# Patient Record
Sex: Female | Born: 1935
Health system: Southern US, Community
[De-identification: ages and names within clinical notes are randomized; demographics above are authoritative.]

## PROBLEM LIST (undated history)

## (undated) DIAGNOSIS — I779 Disorder of arteries and arterioles, unspecified: Secondary | ICD-10-CM

## (undated) DIAGNOSIS — I4892 Unspecified atrial flutter: Secondary | ICD-10-CM

## (undated) DIAGNOSIS — I739 Peripheral vascular disease, unspecified: Secondary | ICD-10-CM

## (undated) DIAGNOSIS — I509 Heart failure, unspecified: Secondary | ICD-10-CM

## (undated) DIAGNOSIS — M1990 Unspecified osteoarthritis, unspecified site: Secondary | ICD-10-CM

## (undated) DIAGNOSIS — I517 Cardiomegaly: Secondary | ICD-10-CM

## (undated) DIAGNOSIS — I10 Essential (primary) hypertension: Secondary | ICD-10-CM

## (undated) DIAGNOSIS — I442 Atrioventricular block, complete: Secondary | ICD-10-CM

## (undated) DIAGNOSIS — I2510 Atherosclerotic heart disease of native coronary artery without angina pectoris: Secondary | ICD-10-CM

## (undated) DIAGNOSIS — R042 Hemoptysis: Secondary | ICD-10-CM

## (undated) DIAGNOSIS — K219 Gastro-esophageal reflux disease without esophagitis: Secondary | ICD-10-CM

## (undated) DIAGNOSIS — E785 Hyperlipidemia, unspecified: Secondary | ICD-10-CM

## (undated) DIAGNOSIS — E039 Hypothyroidism, unspecified: Secondary | ICD-10-CM

## (undated) DIAGNOSIS — I4819 Other persistent atrial fibrillation: Secondary | ICD-10-CM

## (undated) DIAGNOSIS — I441 Atrioventricular block, second degree: Secondary | ICD-10-CM

## (undated) DIAGNOSIS — I071 Rheumatic tricuspid insufficiency: Secondary | ICD-10-CM

## (undated) HISTORY — DX: Rheumatic tricuspid insufficiency: I07.1

## (undated) HISTORY — DX: Other persistent atrial fibrillation: I48.19

## (undated) HISTORY — PX: COLON SURGERY: SHX602

## (undated) HISTORY — PX: TONSILLECTOMY: SUR1361

## (undated) HISTORY — DX: Atrioventricular block, complete: I44.2

## (undated) HISTORY — DX: Essential (primary) hypertension: I10

## (undated) HISTORY — PX: ABDOMINAL SURGERY: SHX537

## (undated) HISTORY — PX: HEMORROIDECTOMY: SUR656

## (undated) HISTORY — DX: Cardiomegaly: I51.7

## (undated) HISTORY — DX: Hypothyroidism, unspecified: E03.9

## (undated) HISTORY — DX: Gastro-esophageal reflux disease without esophagitis: K21.9

## (undated) HISTORY — DX: Atherosclerotic heart disease of native coronary artery without angina pectoris: I25.10

## (undated) HISTORY — DX: Hyperlipidemia, unspecified: E78.5

## (undated) HISTORY — DX: Unspecified osteoarthritis, unspecified site: M19.90

## (undated) HISTORY — PX: PACEMAKER INSERTION: SHX728

---

## 2003-09-11 ENCOUNTER — Encounter (INDEPENDENT_AMBULATORY_CARE_PROVIDER_SITE_OTHER): Payer: Self-pay | Admitting: Specialist

## 2003-09-11 ENCOUNTER — Inpatient Hospital Stay (HOSPITAL_COMMUNITY): Admission: EM | Admit: 2003-09-11 | Discharge: 2003-09-16 | Payer: Self-pay | Admitting: General Surgery

## 2003-12-18 ENCOUNTER — Ambulatory Visit (HOSPITAL_COMMUNITY): Admission: RE | Admit: 2003-12-18 | Discharge: 2003-12-19 | Payer: Self-pay | Admitting: *Deleted

## 2005-07-28 ENCOUNTER — Inpatient Hospital Stay (HOSPITAL_BASED_OUTPATIENT_CLINIC_OR_DEPARTMENT_OTHER): Admission: RE | Admit: 2005-07-28 | Discharge: 2005-07-28 | Payer: Self-pay | Admitting: *Deleted

## 2009-05-17 ENCOUNTER — Encounter: Payer: Self-pay | Admitting: Internal Medicine

## 2009-06-18 ENCOUNTER — Encounter: Payer: Self-pay | Admitting: Internal Medicine

## 2009-10-23 ENCOUNTER — Ambulatory Visit: Payer: Self-pay | Admitting: Cardiology

## 2009-12-02 ENCOUNTER — Encounter: Payer: Self-pay | Admitting: Internal Medicine

## 2009-12-11 ENCOUNTER — Encounter: Payer: Self-pay | Admitting: Internal Medicine

## 2009-12-11 ENCOUNTER — Ambulatory Visit: Payer: Self-pay | Admitting: Internal Medicine

## 2009-12-11 ENCOUNTER — Ambulatory Visit (HOSPITAL_COMMUNITY): Admission: RE | Admit: 2009-12-11 | Discharge: 2009-12-11 | Payer: Self-pay | Admitting: Internal Medicine

## 2009-12-11 DIAGNOSIS — I10 Essential (primary) hypertension: Secondary | ICD-10-CM

## 2009-12-11 DIAGNOSIS — E785 Hyperlipidemia, unspecified: Secondary | ICD-10-CM

## 2009-12-11 DIAGNOSIS — I2510 Atherosclerotic heart disease of native coronary artery without angina pectoris: Secondary | ICD-10-CM

## 2009-12-11 DIAGNOSIS — I442 Atrioventricular block, complete: Secondary | ICD-10-CM

## 2009-12-11 DIAGNOSIS — Z950 Presence of cardiac pacemaker: Secondary | ICD-10-CM

## 2009-12-16 ENCOUNTER — Telehealth: Payer: Self-pay | Admitting: Internal Medicine

## 2009-12-18 ENCOUNTER — Encounter: Payer: Self-pay | Admitting: Internal Medicine

## 2009-12-19 ENCOUNTER — Ambulatory Visit: Payer: Self-pay

## 2009-12-19 ENCOUNTER — Encounter: Payer: Self-pay | Admitting: Internal Medicine

## 2009-12-24 ENCOUNTER — Telehealth: Payer: Self-pay | Admitting: Internal Medicine

## 2010-03-05 ENCOUNTER — Ambulatory Visit: Payer: Self-pay | Admitting: Internal Medicine

## 2010-03-05 ENCOUNTER — Encounter: Payer: Self-pay | Admitting: Internal Medicine

## 2010-04-24 NOTE — Procedures (Signed)
Summary: wound check/talk to ja about coumadin   Current Medications (verified): 1)  Synthroid 100 Mcg Tabs (Levothyroxine Sodium) .... Once Daily 2)  Crestor 20 Mg Tabs (Rosuvastatin Calcium) .... Take One Tablet By Mouth Daily. 3)  Losartan Potassium 100 Mg Tabs (Losartan Potassium) .... Take 1 Tablet By Mouth Once A Day 4)  Vitamin D (Ergocalciferol) 50000 Unit Caps (Ergocalciferol) .... Bi Weekly 5)  Fish Oil   Oil (Fish Oil) .... Once Daily 6)  Garlic   Powd (Garlic) .... Once Daily 7)  Vitamin B-12 5000 Mcg Subl (Cyanocobalamin) .... Once Daily 8)  Timolol Maleate 0.5 % Solg (Timolol Maleate) .... Uad 9)  Xalatan 0.005 % Soln (Latanoprost) .... Uad 10)  Aspirin 81 Mg Tbec (Aspirin) .... Take One Tablet By Mouth Daily  Allergies (verified): No Known Drug Allergies  PPM Specifications Following MD:  [name], MD     Referring MD:  [place] PPM Vendor:  Medtronic     PPM Model Number:  [id number]     PPM Serial Number:  [id number] H PPM DOI:  12/18/2003     PPM Implanting MD:  [facility]  Lead 1    Location: RA     DOI: 12/18/2003     Model #: 0973-53     Serial #: [id number]     Status: active Lead 2    Location: RV     DOI: 12/18/2003     Model #: 4196-22     Serial #: [id number]     Status: active   Indications:  CHB   PPM Follow Up Battery Voltage:  2.79 V     Battery Est. Longevity:  13.5 yrs       PPM Device Measurements Atrium  Amplitude: 5.60 mV, Impedance: 678 ohms, Threshold: 0.50 V at 0.40 msec Right Ventricle  Amplitude: 11.20 mV, Impedance: 516 ohms, Threshold: 1.00 V at 0.40 msec  Episodes MS Episodes:  1     Percent Mode Switch:  <1%     Ventricular High Rate:  0     Atrial Pacing:  28.6%     Ventricular Pacing:  3.3%  Parameters Mode:  MVP     Lower Rate Limit:  60     Upper Rate Limit:  110 Paced AV Delay:  150     Sensed AV Delay:  120 Next Cardiology Appt Due:  02/24/2010 Tech Comments:  WOUND CHECK--STERI STRIPS REMOVED.  NO REDNESS OR SWELLING AT SITE.  1  MODE SWITCH SINCE IMPLANT LASTING LESS THAN 1 MINUTE. NORMAL DEVICE FUNCTION.  NO CHANGES MADE. ROV IN 3 MTHS W/JA. PT HAVING PROBLEMS W/BP--TO BE ADDRESSED BY DB. Vella Kohler  December 19, 2009 2:20 PM

## 2010-04-24 NOTE — Procedures (Signed)
Summary: Cardiology Device Clinic   Current Medications (verified): 1)  Synthroid 100 Mcg Tabs (Levothyroxine Sodium) .... Once Daily 2)  Crestor 20 Mg Tabs (Rosuvastatin Calcium) .... Take One Tablet By Mouth Daily. 3)  Losartan Potassium 50 Mg Tabs (Losartan Potassium) .... Once Daily 4)  Vitamin D (Ergocalciferol) 50000 Unit Caps (Ergocalciferol) .... Bi Weekly 5)  Fish Oil   Oil (Fish Oil) .... Once Daily 6)  Garlic   Powd (Garlic) .... Once Daily 7)  Vitamin B-12 5000 Mcg Subl (Cyanocobalamin) .... Once Daily 8)  Timolol Maleate 0.5 % Solg (Timolol Maleate) .... Uad 9)  Xalatan 0.005 % Soln (Latanoprost) .... Uad 10)  Aspirin 81 Mg Tbec (Aspirin) .... Take One Tablet By Mouth Daily  Allergies (verified): No Known Drug Allergies  PPM Specifications Following MD:  [name], MD     Referring MD:  [place] PPM Vendor:  Medtronic     PPM Model Number:  [id number]     PPM Serial Number:  [id number] H PPM DOI:  12/18/2003     PPM Implanting MD:  [facility]  Lead 1    Location: RA     DOI: 12/18/2003     Model #: 9147-82     Serial #: [id number]     Status: active Lead 2    Location: RV     DOI: 12/18/2003     Model #: 5784-69     Serial #: [id number]     Status: active   Indications:  CHB   PPM Follow Up Battery Voltage:  EOL V       PPM Device Measurements Atrium  Impedance: 680 ohms,  Right Ventricle  Amplitude: 3.3 mV, Impedance: 408 ohms,   Episodes MS Episodes:  0     Ventricular High Rate:  0     Ventricular Pacing:  52.3% Tech Comments:  BATTERY AT EOL--REACHED ERI 05-10-09.  LAST CHECK 05-06-09 AT [facility].  PT SCHEDULED FOR GENERATOR CHANGE ON 12-11-09 W/JA.  Vella Kohler  December 11, 2009 2:01 PM MD Comments:  Pt with EnRhythm device with known early battery depletion.  Given h/o Mobitz II AV block, it was felt most prudent to proceed with pulse geneartor replacement.  This was performed by me on 12/11/09

## 2010-04-24 NOTE — Cardiovascular Report (Signed)
Summary: Office Visit   Office Visit   Imported By: Roderic Ovens 01/02/2010 11:59:58  _____________________________________________________________________  External Attachment:    Type:   Image     Comment:   External Document

## 2010-04-24 NOTE — Miscellaneous (Signed)
Summary: Device change out  Clinical Lists Changes  Observations: Added new observation of PPM IMP MD: [name], MD (12/18/2009 16:59) Added new observation of PPM DOI: 12/11/2009 (12/18/2009 16:59) Added new observation of PPM SERL#: [id number] (12/18/2009 16:59) Added new observation of PPM MODL#: [id number] (12/18/2009 16:59) Added new observation of PPMEXPLCOMM: 12/11/09 Medtronic EnRythm [id number] H EXPLANTED (12/18/2009 16:59) Added new observation of PPM INDICATN: CHB Mobitz II (12/18/2009 16:59) Added new observation of MAGNET RTE: BOL 85 ERI 65 (12/18/2009 16:59)      PPM Specifications Following MD:  [name], MD     Referring MD:  [place] PPM Vendor:  Medtronic     PPM Model Number:  ADDRL1     PPM Serial Number:  [id number] H PPM DOI:  12/11/2009     PPM Implanting MD:  [name], MD  Lead 1    Location: RA     DOI: 12/18/2003     Model #: 7425-95     Serial #: [id number]     Status: active Lead 2    Location: RV     DOI: 12/18/2003     Model #: 2951-88     Serial #: [id number]     Status: active  Magnet Response Rate:  BOL 85 ERI 65  Indications:  CHB Mobitz II  Explantation Comments:  12/11/09 Medtronic EnRythm [id number]/[id number] H EXPLANTED

## 2010-04-24 NOTE — Assessment & Plan Note (Signed)
Summary: np6/ cad, ov, pt has Surveyor, quantity gd   Visit Type:   Follow-up Primary Provider:  Laurene Footman, MD   History of Present Illness: [name] is an 77 y/o woman with h/o HTN, HL, non-obstructive CAD, moderate cartoid stenosis, CHB s/p PPM previously followed by Dr. Reyes Ivan who is now referred by Dr. Evlyn Kanner to establish cardiology f/u.               Doing very well. Remains active. Walks slowly on her property with her Advertising account planner.  Does all housework and works in yard without CP or SOB. No dizziness. Occasional plapitations says she feels like her pacemaker might be kicking in. No orthopnea, PND or edema. No focal neurological symptoms.   Not taking ASA due to stomach upset. Takes 400 ibuprofen two times a day.   TC 151 TG 117 HDL 59 LDL 69   Current Medications (verified): 1)  Synthroid 100 Mcg Tabs (Levothyroxine Sodium) .... Once Daily 2)  Crestor 20 Mg Tabs (Rosuvastatin Calcium) .... Take One Tablet By Mouth Daily. 3)  Losartan Potassium 50 Mg Tabs (Losartan Potassium) .... Once Daily 4)  Vitamin D (Ergocalciferol) 50000 Unit Caps (Ergocalciferol) .... Bi Weekly 5)  Fish Oil   Oil (Fish Oil) .... Once Daily 6)  Garlic   Powd (Garlic) .... Once Daily 7)  Vitamin B-12 5000 Mcg Subl (Cyanocobalamin) .... Once Daily 8)  Timolol Maleate 0.5 % Solg (Timolol Maleate) .... Uad 9)  Xalatan 0.005 % Soln (Latanoprost) .... Uad 10)  Aspirin 81 Mg Tbec (Aspirin) .... Take One Tablet By Mouth Daily  Allergies (verified): No Known Drug Allergies  Past History:  Past Medical History:  1.  Sick sinus syndrome, trifascicular conduction delay status post MDT dual-      chamber permanent pacemaker placement.  2.  Osteoarthritis.  3.  Gastroesophageal reflux disease.  4. Nonobs CAD - cath 2005     -EF 55-60% EDP 9      -LM: Ok     -LAD 40-50% mid     -LCX: ok    --RCA: ok 5. Carotid stenosis 1-50% bilateral (upper end) - March 2011 6. HTN 7. HL  8.  Hypothyroidism  Family History: Non-contributory  Review of Systems       As per HPI and past medical history; otherwise all systems negative.   Vital Signs:  Patient profile:   77 year old female Height:      67 inches Weight:      174 pounds BMI:     27.35 Pulse rate:   66 / minute BP sitting:   110 / 80  (right arm)  Vitals Entered By: Laurance Flatten CMA (December 11, 2009 10:25 AM)  Physical Exam  General:  Well appearing. no resp difficulty HEENT: normal Neck: supple. no JVD. Carotids 2+ bilat; no bruits. No lymphadenopathy or thryomegaly appreciated. Cor: PMI nondisplaced. Regular rate & rhythm. No rubs, murmur. +s4 Lungs: clear Abdomen: soft, nontender, nondistended. No hepatosplenomegaly. No bruits or masses. Good bowel sounds. Extremities: no cyanosis, clubbing, rash, edema Neuro: alert & orientedx3, cranial nerves grossly intact. moves all 4 extremities w/o difficulty. affect pleasant    Impression & Recommendations:  Problem # 1:  HYPERTENSION, BENIGN (ICD-401.1) Initial BP here in clinic was normal. On my manual recheck it was 190/100! I am not sure if this is white coat HTN or poorly controlled essential HTN. We will have her get a home BP cuff and monitor BPs 1-2x per day  at home and get these numbers back to [place] and Dr. Evlyn Kanner to review. Have asked her to cut down on ibuprofen which may be contributing to HTN.   Problem # 2:  CAD, NATIVE VESSEL (ICD-414.01) Stable. No evidence of ischemia. Start ECASA 81.   Problem # 3:  AV BLOCK, COMPLETE (ICD-426.0) Pacer is at end of its battery life. Will have her see EP for generator change out ASAP.  Problem # 4:  HYPERLIPIDEMIA-MIXED (ICD-272.4) LDL at goal. Continue Crestor.   Other Orders: EKG w/ Interpretation (93000)  Patient Instructions: 1)  Your physician discussed the risks, benefits and indications for preventive aspirin therapy. It is recommended that you start (or continue) taking 81 mg of aspirin a  day. 2)  We have given you a prescription to get a home BP cuff 3)  Your physician wants you to follow-up in:  6 months.  You will receive a reminder letter in the mail two months in advance. If you don't receive a letter, please call our office to schedule the follow-up appointment.

## 2010-04-24 NOTE — Cardiovascular Report (Signed)
Summary: Office Visit   Office Visit   Imported By: Roderic Ovens 12/30/2009 11:23:47  _____________________________________________________________________  External Attachment:    Type:   Image     Comment:   External Document

## 2010-04-24 NOTE — Letter (Signed)
Summary: [facility] Office Visit Note   [facility] Office Visit Note   Imported By: Roderic Ovens 12/24/2009 12:50:10  _____________________________________________________________________  External Attachment:    Type:   Image     Comment:   External Document

## 2010-04-24 NOTE — Progress Notes (Signed)
Summary: pt request call  Phone Note Call from Patient Call back at [facility] Phone [phone]   Caller: Patient Summary of Call: Pt request call Initial call taken by: Judie Grieve,  December 24, 2009 9:04 AM  Follow-up for Phone Call        spoke w/pt she is concerned b/c her BP cont. to be elevated and losartan has been increased for a week, today was 165/87, yest was 185/101 and 189/98, she is now noticing a headache occ. and some dizziness, advised she shoud f/u w/Dr Evlyn Kanner, she is agreeable Meredith Staggers, RN  December 24, 2009 9:40 AM

## 2010-04-24 NOTE — Cardiovascular Report (Signed)
Summary: Office Visit   Office Visit   Imported By: Roderic Ovens 03/13/2010 13:58:52  _____________________________________________________________________  External Attachment:    Type:   Image     Comment:   External Document

## 2010-04-24 NOTE — Progress Notes (Signed)
Summary: bp high  Phone Note Call from Patient   Caller: Patient [phone] Reason for Call: Talk to Nurse Summary of Call: pt calling re bp reading-bp high pls advise Initial call taken by: Glynda Jaeger,  December 16, 2009 10:34 AM  Follow-up for Phone Call        spoke w/pt she got a new BP monitor and has been checking her BP, 9/22 134/104 9/23, 7am 156/91, 4:30 126/78 9/24, 12pm 157/79, 9:30pm 147/84 9/25, 9:40am 186/84, 10:20 165/86, 3:30 88/71, 10:30 167/84 9/26, 10:30 189/93, 12:30 150/98 pt states she hasn't felt bad, no dizziness or headache, she takes Losartan 50mg  every am around 9am, will send to Dr Gala Romney for reveiw and call her in am Meredith Staggers, RN  December 16, 2009 5:22 PM   Additional Follow-up for Phone Call Additional follow up Details #1::        inrease losartan to 100qd. check bmet 1 week Dolores Patty, MD, [facility]  December 16, 2009 11:51 PM  Left message to call back Meredith Staggers, RN  December 17, 2009 9:26 AM   pt aware, she will incrase med, she will cont. to monitor BP and let me know how its doing Meredith Staggers, RN  December 17, 2009 11:42 AM     New/Updated Medications: LOSARTAN POTASSIUM 100 MG TABS (LOSARTAN POTASSIUM) Take 1 tablet by mouth once a day Prescriptions: LOSARTAN POTASSIUM 100 MG TABS (LOSARTAN POTASSIUM) Take 1 tablet by mouth once a day  #30 x 6   Entered by:   Meredith Staggers, RN   Authorized by:   Dolores Patty, MD, [facility]   Signed by:   Meredith Staggers, RN on 12/17/2009   Method used:   Electronically to        [facility]* (retail)       [address] [place], [place]  [address]       Ph: [phone] or [phone]       Fax: [phone]   RxID:   [id number]

## 2010-04-24 NOTE — Assessment & Plan Note (Signed)
Summary: pc2   Visit Type:  Follow-up Primary Provider:  Laurene Footman, MD   History of Present Illness: The patient presents today for routine electrophysiology followup. She reports doing very well since having her pacemaker pulse generator replaced.  Her energy has improved. The patient denies symptoms of palpitations, chest pain, shortness of breath, orthopnea, PND, lower extremity edema, dizziness, presyncope, syncope, or neurologic sequela. The patient is tolerating medications without difficulties and is otherwise without complaint today.   Current Medications (verified): 1)  Synthroid 100 Mcg Tabs (Levothyroxine Sodium) .... Once Daily 2)  Crestor 20 Mg Tabs (Rosuvastatin Calcium) .... Take One Tablet By Mouth Daily. 3)  Vitamin D (Ergocalciferol) 50000 Unit Caps (Ergocalciferol) .... Bi Weekly 4)  Fish Oil   Oil (Fish Oil) .... Once Daily 5)  Garlic   Powd (Garlic) .... Once Daily 6)  Vitamin B-12 5000 Mcg Subl (Cyanocobalamin) .... Once Daily 7)  Timolol Maleate 0.5 % Solg (Timolol Maleate) .... Uad 8)  Xalatan 0.005 % Soln (Latanoprost) .... Uad 9)  Aspirin 81 Mg Tbec (Aspirin) .... Take One Tablet By Mouth Daily 10)  Valturna 150-160 Mg Tabs (Aliskiren-Valsartan) .... Once Daily 11)  Multivitamins   Tabs (Multiple Vitamin) .... Once Daily 12)  Msm 1000 Mg Tabs (Methylsulfonylmethane) .... Daily 13)  Vitamin B-12 5000 Mcg Subl (Cyanocobalamin) .... Daily  Allergies (verified): No Known Drug Allergies  Past History:  Past Medical History: Reviewed history from 12/11/2009 and no changes required.  1.  Sick sinus syndrome, trifascicular conduction delay status post MDT dual-      chamber permanent pacemaker placement.  2.  Osteoarthritis.  3.  Gastroesophageal reflux disease.  4. Nonobs CAD - cath 2005     -EF 55-60% EDP 9      -LM: Ok     -LAD 40-50% mid     -LCX: ok    --RCA: ok 5. Carotid stenosis 1-50% bilateral (upper end) - March 2011 6. HTN 7. HL  8.  Hypothyroidism  Past Surgical History: Intussusception small bowel surgery PPM 9/11  Social History: Reviewed history from 12/10/2009 and no changes required. Tobacco Use - No.  Alcohol Use - yes Drug Use - no  Review of Systems       All systems are reviewed and negative except as listed in the HPI.   Vital Signs:  Patient profile:   77 year old female Height:      67 inches Weight:      172 pounds BMI:     27.04 Pulse rate:   72 / minute BP sitting:   136 / 76  (left arm)  Vitals Entered By: Laurance Flatten CMA (March 05, 2010 10:28 AM)  Physical Exam  General:  Well appearing. no resp difficulty HEENT: normal Neck: supple. no JVD. Carotids 2+ bilat; no bruits. No lymphadenopathy or thryomegaly appreciated. Cor: PMI nondisplaced. Regular rate & rhythm. No rubs, murmur. +s4 Lungs: clear Abdomen: soft, nontender, nondistended. No hepatosplenomegaly. No bruits or masses. Good bowel sounds. Extremities: no cyanosis, clubbing, rash, edema Neuro: alert & orientedx3, cranial nerves grossly intact. moves all 4 extremities w/o difficulty. affect pleasant pacemaker pocket is well healed   PPM Specifications Following MD:  [name], MD     Referring MD:  [place] PPM Vendor:  Medtronic     PPM Model Number:  [id number]     PPM Serial Number:  [id number] H PPM DOI:  12/11/2009     PPM Implanting MD:  [name], MD  Lead 1  Location: RA     DOI: 12/18/2003     Model #: 1610-96     Serial #: [id number]     Status: active Lead 2    Location: RV     DOI: 12/18/2003     Model #: 9147-82     Serial #: [id number]     Status: active  Magnet Response Rate:  BOL 85 ERI 65  Indications:  Mobitz II  Explantation Comments:  12/11/09 Medtronic EnRythm [id number] H EXPLANTED  PPM Follow Up Battery Voltage:  2.79 V     Battery Est. Longevity:  12 YRS       PPM Device Measurements Atrium  Amplitude: 5.60 mV, Impedance: 666 ohms, Threshold: 0.50 V at 0.40 msec Right Ventricle   Amplitude: PACED mV, Impedance: 455 ohms, Threshold: 0.750 V at 0.40 msec  Episodes MS Episodes:  30     Percent Mode Switch:  <0.1%     Ventricular High Rate:  0     Atrial Pacing:  36.0%     Ventricular Pacing:  47.2%  Parameters Mode:  MVP     Lower Rate Limit:  60     Upper Rate Limit:  110 Paced AV Delay:  150     Sensed AV Delay:  120 Next Cardiology Appt Due:  11/24/2010 Tech Comments:  30 MODE SWITCHES--ALL LESS THAN 1 MINUTE.  NORMAL DEVICE FUNCTION.  NO CHANGES MADE.  ROV W/JA SEPT 2012. Vella Kohler  March 05, 2010 10:40 AM MD Comments:  agree mode switches are likely atrial oversensing  Impression & Recommendations:  Problem # 1:  PACEMAKER, PERMANENT (ICD-V45.01) normal pacemaker function for mobitz II AV block no changes today  Problem # 2:  HYPERTENSION, BENIGN (ICD-401.1) stable continue to follow with Dr Evlyn Kanner No changes today  Problem # 3:  HYPERLIPIDEMIA-MIXED (ICD-272.4) stable Her updated medication list for this problem includes:    Crestor 20 Mg Tabs (Rosuvastatin calcium) .Marland Kitchen... Take one tablet by mouth daily.  Patient Instructions: 1)  Your physician wants you to follow-up in: Sept 2012  You will receive a reminder letter in the mail two months in advance. If you don't receive a letter, please call our office to schedule the follow-up appointment.

## 2010-05-28 ENCOUNTER — Telehealth: Payer: Self-pay | Admitting: Internal Medicine

## 2010-05-28 ENCOUNTER — Encounter: Payer: Self-pay | Admitting: Internal Medicine

## 2010-06-03 NOTE — Letter (Signed)
Summary: [facility] Phone Note  [facility] Phone Note   Imported By: Earl Many 05/29/2010 10:11:44  _____________________________________________________________________  External Attachment:    Type:   Image     Comment:   External Document

## 2010-06-03 NOTE — Progress Notes (Signed)
Summary: pt needs surgiclal clearence  Phone Note From Other Clinic Call back at [phone]   Caller: Fulton Reek from Dr. Dimas Millin Request: Talk with Nurse, Talk with Provider Summary of Call: Surgical clearence was faxed last week for patient she is having Right upper eye lesion surgery next week and they need her cleared fax# [phone] Initial call taken by: Omer Jack,  May 28, 2010 9:16 AM  Follow-up for Phone Call        clearance done and faxed Dennis Bast, RN, BSN  May 28, 2010 10:06 AM

## 2010-06-03 NOTE — Progress Notes (Signed)
Summary: Surgical Clearance.Marland KitchenMarland KitchenMarland KitchenHand Written Note  Surgical Clearance.Marland KitchenMarland KitchenMarland KitchenHand Written Note   Imported By: Cala Bradford Mesiemore 05/28/2010 10:14:52  _____________________________________________________________________  External Attachment:    Type:   Image     Comment:   External Document

## 2010-06-05 LAB — SURGICAL PCR SCREEN: MRSA, PCR: NEGATIVE

## 2010-06-05 LAB — CBC
HCT: 38.2 % (ref 36.0–46.0)
Hemoglobin: 13.5 g/dL (ref 12.0–15.0)
MCH: 31.3 pg (ref 26.0–34.0)
MCHC: 35.3 g/dL (ref 30.0–36.0)
RBC: 4.32 MIL/uL (ref 3.87–5.11)

## 2010-06-05 LAB — BASIC METABOLIC PANEL
GFR calc Af Amer: >60 mL/min (ref >60)
GFR calc non Af Amer: >60 mL/min (ref >60)
Potassium: 5.2 mEq/L — ABNORMAL HIGH (ref 3.5–5.1)
Sodium: 132 mEq/L — ABNORMAL LOW (ref 135–145)

## 2010-06-10 NOTE — Letter (Signed)
Summary: Hand Written Note.[facility]  Hand Written Note.[facility]   Imported By: Erle Crocker 06/02/2010 10:58:49  _____________________________________________________________________  External Attachment:    Type:   Image     Comment:   External Document

## 2010-08-08 NOTE — Op Note (Signed)
NAMELAURELYN, TERRERO                         ACCOUNT NO.:  [id number]   MEDICAL RECORD NO.:  [id number]                   PATIENT TYPE:  INP   LOCATION:  0001                                 FACILITY:  [facility]   PHYSICIAN:  Anselm Pancoast. Zachery Dakins, M.D.          DATE OF BIRTH:  November 03, 1924   DATE OF PROCEDURE:  09/11/2003  DATE OF DISCHARGE:                                 OPERATIVE REPORT   PREOPERATIVE DIAGNOSES:  Intussusception small bowel.   POSTOPERATIVE DIAGNOSES:  Intussusception with benign mass mid jejunum.   OPERATION:  Exploratory laparotomy, small bowel resection with functional  side to side anastomosis.   ANESTHESIA:  General.   SURGEON:  Anselm Pancoast. Zachery Dakins, M.D.   ASSISTANT:  Nurse.   HISTORY:  Beth Santiago is a 77 year old female who was referred to me by  Dr. Waynard Edwards and Dr. Ardyth Harps for a CT findings of and intussusception that  was noted at [facility].  She has had cramping abdominal pain  over the last 3-4 weeks, more intense in the last about one week and had  been started on antibiotics the previous day thinking this was possibly  diverticulitis. The patient's CT, however, did not show diverticulitis but  did show definite lesion consistent with an intussusception in the mid small  bowel and I saw her today.  She had no blood in her stool and was not  significantly distended but I recommended we proceed on with laparotomy at  this obviously was pending obstructing.  The patient preoperatively was  given 3 g of Unasyn and she has PAS stockings.  Her hematocrit was 27, this  was the first time she has ever been known to be anemic and even though we  did not document blood in her stool on rectal exam, this is lower than  normal. She has never had a colonoscopy.   The patient was taken to the operative suite, induction of general  anesthesia, endotracheal tube, oral NG tube was placed through the nose and  then the abdomen was prepped with  Betadine surgical solution and draped in a  sterile manner. A Foley catheter had been inserted sterilely.  I made a  small incision right around the umbilicus and then carefully dissected down  through the fatty tissue. The fascia was opened, Kocher's were applied and  carefully opened into the peritoneal cavity.  The small bowel was a little  bit dilated but not tense by any means and the mass was easily identified  and brought up through the small incision. I had made the incision just big  enough that I could get my fist in and of course this was very mobile and I  could bring the area above skin level. The intussusception was first kind of  straightened out and you could then feel kind of an egg size lesion that was  obviously attached to the bowel and whether it was a little  vascular change  here like possibly a Meckel's but this is not in the location of a typical  Meckel's as it is much too proximal. I divided the mesentery between [facility]  and these were ligated with 2-0 Vicryl and then I excised the area doing a  side to side functional end to end anastomosis using a GIA and then after  opening the area making sure there was good hemostasis using a TA60.  B[facility] Santiago'  had been placed on the stapled edges and then this fired and removed from  the field.  Dr. Luisa Hart did a frozen on this and says it is predominantly a  fatty mass and whether it was kind of a cyst or exactly what he is not sure  but he it is definitely not a malignancy.  I sent it for frozen to make sure  that we were not going to need to do a more mesenteric resection if it had  been a malignancy. On the proximal small bowel, it was dilated but no other  lesions were noted. The distal small bowel was small caliber and no  essential adhesions.  On the small Deaver pulling up on the upper abdomen,  we had noted a little cyst area in the left lobe of the liver which I could  feel and confirm and the gallbladder has no  gallstones that I could palpate.  The omentum was brought down over this and I had placed a few little  stitches on the mucosal edge for hemostasis and then also had closed the  little mesenteric defect with 3-0 silk sutures. Next, the bowel is lying  comfortable, the omentum is brought over it and then the fascia is closed  with interrupted sutures of #0 Prolene, about 12 sutures all total. The  subcutaneous tissue was irrigated and aspirated and the skin closed with  staples.  I am going to keep the Foley and NG tube for the next 12-24 hours  and then hopefully can remove both in the morning and probably keep her  n.p.o. for approximately 24 hours before starting a diet.  The patient  tolerated the procedure nicely and we will check a CBC again in the morning.  I did to a type and screen but there was minimal blood loss at surgery and  she is hemodynamically stable.                                               Anselm Pancoast. Zachery Dakins, M.D.    WJW/MEDQ  D:  09/11/2003  T:  09/11/2003  Job:  [id number]   cc:   Jeannett Senior A. Evlyn Kanner, M.D.  [address]  [place]  [place] [address]  Fax: [phone]

## 2010-08-08 NOTE — Discharge Summary (Signed)
[name], [name]                         ACCOUNT NO.:  [id number]   MEDICAL RECORD NO.:  [id number]                   PATIENT TYPE:  INP   LOCATION:  0444                                 FACILITY:  [facility]   PHYSICIAN:  Anselm Pancoast. Zachery Dakins, M.D.          DATE OF BIRTH:  1924/06/27   DATE OF ADMISSION:  09/11/2003  DATE OF DISCHARGE:  09/16/2003                                 DISCHARGE SUMMARY   DISCHARGE DIAGNOSES:  1. Intussusception of small bowel secondary to basically a lipoma with     ulceration.  2. Anemia secondary to gastrointestinal bleeding from ulcer over small bowel     tumor.   OPERATION:  Laparotomy with small bowel resection.  __________ .   HISTORY:  Beth Santiago is a 77 year old female, wife of a patient who had  recently died of colon cancer whom I  saw in the office on September 15, 2003.  She was referred after Dr. Waynard Santiago ordered a CT the previous day when she had  severe cramping abdominal pain.  The CT was noticed  by __________  to show  intussusception.  She was contacted after being seen in the office, and I  had her in the office the following morning and did see the patient.  She  was obviously cramping and bloated, and not as acutely ill as you would have  thought if she had a bowel obstruction.  I sent her back over to [facility]  and had a limited CT cuts performed which showed the intussusception was  still present and then added her to the OR schedule.   Laboratory studies were performed, and her hematocrit was 27.  She was not  known to be anemic.  No blood work had been done when she was seen by Dr.  Waynard Santiago.  I discussed with her about this and she said that she really did  not need transfusions or __________ and wanted not to be transfused. We  started intravenous fluids and gave her 3 grams of __________ Beth Santiago her to  surgery and she had an intussusception and mass in the bowel about the size  of an egg.  This was removed __________  staple  anastomosis, and the tumor  was examined pathologically and thought to be benign.  Final report showed  it was a benign submucosal lipoma with some ulceration but no malignancy  noted, but I had it confirmed to be benign at the time of surgery so I did  not do a __________ and mesenteric resection as I would have had it been  obviously malignant.  Postoperatively, she did fine.  Had an NG tube for  about 36 hours.  __________ [facility] the NG tube out.  She started passing  flatus the following day and then her diet was advanced.  I was out of town  over the last few days of her hospitalization.  Dr. Jamey Santiago followed her and  advanced her diet and discharged her with instructions to see me in the  office in approximately four days to remove the skin staples.  The patient's  hematocrit dropped down to approximately 23, but we started oral iron, and  this will be followed in the office.  She was never symptomatic as far as  orthostatic changes, and the hematocrit was up to about 25-26 range when she  was discharged.  She will continue on her chronic medications, which are  Prilosec, Synthroid, and she had had a question over whether she had reflux  over the last several years, and questioned whether these symptoms would  subside and that the etiology of this partial small bowel obstruction would  resolve.  She has never wanted __________ evaluation to see whether she does  or does not have __________.                                               Anselm Pancoast. Zachery Dakins, M.D.    WJW/MEDQ  D:  09/25/2003  T:  09/25/2003  Job:  [id number]

## 2010-08-08 NOTE — Cardiovascular Report (Signed)
NAMECLEMMA, JOHNSEN               ACCOUNT NO.:  [id number]   MEDICAL RECORD NO.:  [id number]          PATIENT TYPE:  OIB   LOCATION:  1962                         FACILITY:  [facility]   PHYSICIAN:  Elmore Guise., M.D.DATE OF BIRTH:  11-06-1924   DATE OF PROCEDURE:  DATE OF DISCHARGE:                              CARDIAC CATHETERIZATION   INDICATION FOR PROCEDURE:  Abnormal ECG with new ST-T wave changes.   PROCEDURE DESCRIPTION:  The patient was brought to cardiac cath lab after  appropriate informed consent.  She was prepped and draped in a sterile  fashion.  A 4-French sheath was placed in the right femoral artery without  difficulty.  Coronary angiography, LV angiography was then performed without  complication.  Sheath was removed following the procedure.  The patient was  transported out of the cardiac cath lab in stable condition.   FINDINGS:  1.  Left Main:  Normal.  2.  LAD:  Mild proximal luminal irregularities with mid 40 to 50% stenosis      at bifurcation with first septal perforator.  Distal vessel has mild      luminal irregularity.  3.  D1:  Moderate size, high branching, with mild luminal irregularities.  4.  LCX:  Nondominant with mild luminal irregularities.  5.  RCA:  Dominant with mild luminal irregularity.  6.  PDA and PLV:  Moderate size vessels and mild luminal irregularity.  7.  LV:  EF is 55 to 60%.  No significant wall motion abnormalities.  LVEDP      was 9-mmHg.   IMPRESSION:  1.  Nonobstructive mid-left anterior descending artery stenosis of 40-50%.  2.  Left circumflex, right coronary artery, and diagonal with no significant      obstructive disease.  3.  Preserved left ventricle systolic function with an ejection fraction of      55-60%.   PLAN:  1.  At the current time, I would recommend aggressive risk factor      modification as indicated.  2.  I would increase her Crestor to 5 mg daily with goal LDL of less than      70.  3.  I would  also recommend increasing her aspirin to 81 mg once daily.  4.  I will see her and 1-2 weeks or sooner if needed.      Elmore Guise., M.D.  Electronically Signed     TWK/MEDQ  D:  07/28/2005  T:  07/28/2005  Job:  [id number]   cc:   Jeannett Senior A. Evlyn Kanner, M.D.  Fax: [phone]

## 2010-08-08 NOTE — Op Note (Signed)
[name], Beth Santiago               ACCOUNT NO.:  [id number]   MEDICAL RECORD NO.:  [id number]          PATIENT TYPE:  OIB   LOCATION:  4729                         FACILITY:  [facility]   PHYSICIAN:  Elmore Guise., M.D.DATE OF BIRTH:  [birth year]   DATE OF PROCEDURE:  12/18/2003  DATE OF DISCHARGE:                                 OPERATIVE REPORT   INDICATION:  Intermittent second-degree atrioventricular block,  trifascicular block, sick sinus syndrome.   PROCEDURE:  Dual-chamber permanent pacemaker placement with a [employer]onic  EnRhythm generator.   SURGEON:  Rosine Abe, M.D.   PROCEDURE:  The patient was brought to the cardiac cath lab after  appropriate informed consent.  She was prepped and draped in a sterile  fashion.  Venogram was done on the left arm for evaluation of the axillary  and subclavian veins.  Approximately 1-1/2 inch incision was made in the  left deltopectoral groove.  Subcutaneous pocket was made with blunt and  sharp dissection.  The access was obtained with 2 separate sticks in the  left axillary vein under fluoroscopic guidance.  A 6-French peel-away sheath  was placed and the ventricular lead model 5076, serial #[id number], 52-cm  length, was placed in the ventricle without difficulty.  R wave measured 9.1  mV with a measured impedance of 1077 ohms.  Threshold was maintained at 0.8  pulse at 0.5 msec with a current drain of 1.2 mA.  Following placement of  the ventricular lead, an atrial lead, model 5076, serial #[id number] in a 45-  cm length was placed in the atrium.  P wave measured 5.3 mV with a measured  impedance of 1013 ohms.  Threshold was maintained at 1.1 volts at 0.5 msec  with a current drain of 1.4 mA.  An EnRhythm generator was then attached to  the ventricular and atrial leads.  Pocket was irrigated copiously with  Kanamycin solution.  Generator was sewn into place.  Wound was closed with 3  separate layers with a 2-0 Vicryl followed 2-0  Vicryl and subcutaneous layer  was closed with a 4-0 Vicryl suture.  The patient tolerated the procedure  well.  She will be transported from the cardiac cath lab in stable  condition.       TWK/MEDQ  D:  12/18/2003  T:  12/18/2003  Job:  [id number]

## 2010-08-08 NOTE — H&P (Signed)
NAMEMERL, GUARDINO                         ACCOUNT NO.:  [id number]   MEDICAL RECORD NO.:  [id number]                   PATIENT TYPE:  INP   LOCATION:  0001                                 FACILITY:  [facility]   PHYSICIAN:  Anselm Pancoast. Zachery Dakins, M.D.          DATE OF BIRTH:  1924-10-18   DATE OF ADMISSION:  09/11/2003  DATE OF DISCHARGE:                                HISTORY & PHYSICAL   CHIEF COMPLAINT:  Abdominal pain, cramping, intussusception on CT.   HISTORY:  Beth Santiago is a 77 year old Caucasian female whom I first saw  as a patient this morning.  I have operated on her husband six or eight  months ago with colon cancer and she states that over the last several weeks  she has had some nausea, bloating, and cramping. She has had a past history  of esophageal reflux, but these have been more cramping in the mid abdomen.  Yesterday, she saw Dr. Waynard Edwards and __________, her regular physician, and he  did lab studies that were unremarkable, but then ordered a CT that was done  at [facility]. This showed an intussusception in the small bowel probably  kind of mid small bowel. She was contacted, but stated that she was not  having any basically acute symptoms and wanted to wait and see me in the  office today. I saw her this morning and on exam she stated she did have  diarrhea after the CT contrast yesterday, but she was not cramping or  clinically obstructed. On examination I did not find her definitely acutely  tender. She was maybe more tender to the left of midline where the area was  noted on CT, but on rectal exam her stools were minimal and hemoccult  negative. I was not sure whether the intussusception was still present or  not since she was not really that symptomatic over the last 24 hours. Of  course, she has not been eating either. I sent her over here and Dr. Nicholos Johns  did limited course on the CT that confirmed that the area was still present.  I checked a CBC and the  hematocrit was 27 and this is the first time she has  ever been noted to be anemic, even though she does not admit to having any  black tarry stools or changes that she had noted like intestinal bleeding.  I recommended that we proceed on with urgent laparotomy and it was scheduled  for today.   PAST MEDICAL HISTORY:  I think she has had an inguinal hernia repair by Dr.  Samuella Cota years ago. She has a history of thyroid replacement and denies  significant weight loss. Please refer to the review of systems filled out  the nurses, which are essentially unremarkable.   CURRENT MEDICATIONS:  She is on Prilosec one q.a.m. and p.m., Tums p.r.n.,  Welchol 65 b.i.d., Zetia 10 mg q.a.m., Synthroid 50 mg two hours, baby  aspirin, Cipro which was started I think the day before yesterday when she  saw Dr. Waynard Edwards; question whether this was diverticulitis. He was also  started on Flagyl 500 mg t.i.d. and glucosamine.   ALLERGIES:  She states she is not allergic to any medications.   PHYSICAL EXAMINATION:  VITAL SIGNS: Temperature 99.3, pulse 91, respirations  24, weight 180. She is 5 feet 7 inches. Blood pressure is 139/70.  HEENT: Appears adequately hydrated.  NECK: No cervical or supraclavicular lymphadenopathy.  LUNGS: Good breath sounds.  CARDIAC: Normal sinus rhythm.  ABDOMEN: Soft, not really high pitched bowel sounds.  RECTAL EXAM: Unremarkable.  EXTREMITIES: No pedal edema. Good peripheral pulses.  CNS: Physiologic.   ADMISSION IMPRESSION:  Intussusception mid small bowel, hopefully benign.   PLAN:  Laparotomy with resection.                                               Anselm Pancoast. Zachery Dakins, M.D.    WJW/MEDQ  D:  09/11/2003  T:  09/11/2003  Job:  [id number]

## 2010-08-08 NOTE — Discharge Summary (Signed)
Beth Santiago, HOFBAUER               ACCOUNT NO.:  [id number]   MEDICAL RECORD NO.:  [id number]          PATIENT TYPE:  OIB   LOCATION:  4729                         FACILITY:  [facility]   PHYSICIAN:  Elmore Guise., M.D.DATE OF BIRTH:  Dec 06, 1924   DATE OF ADMISSION:  12/18/2003  DATE OF DISCHARGE:  12/19/2003                                 DISCHARGE SUMMARY   DISCHARGE DIAGNOSES:  1.  Sick sinus syndrome, trifascicular conduction delay status post dual-      chamber permanent pacemaker placement.  2.  Osteoarthritis.  3.  Gastroesophageal reflux disease.   HISTORY OF PRESENT ILLNESS:  This 77 year old white female with past medical  history of GERD and osteoarthritis presented for evaluation of syncope.  Telemetry monitor shows intermittent second degree AV block with  trifascicular block.  The patient underwent routine ECG stress test with  worsening conduction delay and an AV block.  The patient with no signs of  ischemia.  The patient was admitted to the hospital on September 27 for  elective pacemaker placement.   HOSPITAL COURSE:  The patient underwent dual-chamber permanent pacemaker  placement on December 18, 2003.  She tolerated the procedure well.  Postoperatively, she has been tolerating p.o. and having no significant  problems.  Chest x-ray post procedure showed proper placement of the atrial  and ventricular leads.  Pacemaker site was without edema and no bleeding.  Pacemaker was interrogated prior to discharge showing both atrial and  ventricular thresholds of 0.5 millivolts at 0.5 milliseconds.  The patient  will be discharged home today to continue the following medications.   DISCHARGE MEDICATIONS:  1.  Synthroid 100 mcg daily.  2.  Prilosec 20 mg daily.  3.  Glucosamine daily.  4.  Centrum daily.  5.  Tequin 400 mg daily for five days.  6.  Pain management is Tylenol Extra Strength q.6h. p.r.n.   DISCHARGE INSTRUCTIONS:  1.  Activity:  The patient was  given instruction for post pacemaker      restrictions, primarily voiding, getting the area wet for the next five      days, to Betadine her incision over her steri-strips for the next three      days and to avoid lifting her left arm of her 90 degrees or lifting      anything heavier than 10 pounds with her left arm for the next couple of      weeks.  2.  Diet:  As tolerated.  3.  Wound care:  The patient was given instructions on appropriate wound      care.  She was asked to call the office should she have any redness,      swelling, discharge from area or should she have any further questions.      She will follow up in one week for wound check.       TWK/MEDQ  D:  12/19/2003  T:  12/19/2003  Job:  [id number]

## 2010-12-17 ENCOUNTER — Encounter: Payer: Self-pay | Admitting: Internal Medicine

## 2010-12-17 ENCOUNTER — Ambulatory Visit (INDEPENDENT_AMBULATORY_CARE_PROVIDER_SITE_OTHER): Payer: Medicare Other | Admitting: Internal Medicine

## 2010-12-17 DIAGNOSIS — I2510 Atherosclerotic heart disease of native coronary artery without angina pectoris: Secondary | ICD-10-CM

## 2010-12-17 DIAGNOSIS — Z950 Presence of cardiac pacemaker: Secondary | ICD-10-CM

## 2010-12-17 DIAGNOSIS — I10 Essential (primary) hypertension: Secondary | ICD-10-CM

## 2010-12-17 DIAGNOSIS — I442 Atrioventricular block, complete: Secondary | ICD-10-CM

## 2010-12-17 LAB — PACEMAKER DEVICE OBSERVATION
AL AMPLITUDE: 5.6 mv
AL THRESHOLD: 0.5 V
ATRIAL PACING PM: 77
BATTERY VOLTAGE: 2.79 V
RV LEAD THRESHOLD: 1 V
VENTRICULAR PACING PM: 98

## 2010-12-17 NOTE — Assessment & Plan Note (Signed)
Above goal today, though she reports good BP control at home Sodium restriction advised She will continue to follow her BP at home and discuss with Dr Evlyn Kanner.

## 2010-12-17 NOTE — Patient Instructions (Signed)
Your physician wants you to follow-up in: 12 months with Dr Jacquiline Doe will receive a reminder letter in the mail two months in advance. If you don't receive a letter, please call our office to schedule the follow-up appointment.  Remote monitoring is used to monitor your Pacemaker of ICD from home. This monitoring reduces the number of office visits required to check your device to one time per year. It allows [place] to keep an eye on the functioning of your device to ensure it is working properly. You are scheduled for a device check from home on 03/19/11 You may send your transmission at any time that day. If you have a wireless device, the transmission will be sent automatically. After your physician reviews your transmission, you will receive a postcard with your next transmission date.

## 2010-12-17 NOTE — Assessment & Plan Note (Signed)
No ischemic symptoms Nonobstructive CAD No changes

## 2010-12-17 NOTE — Progress Notes (Signed)
The patient presents today for routine electrophysiology followup.  Since last being seen in our clinic, the patient reports doing very well.  Today, she denies symptoms of palpitations, chest pain, shortness of breath, orthopnea, PND, lower extremity edema, dizziness, presyncope, syncope, or neurologic sequela.  The patient feels that she is tolerating medications without difficulties and is otherwise without complaint today.   Past Medical History  Diagnosis Date  . Complete heart block     s/p PPM by Dr Reyes Ivan 2005, generator change by Dr Johney Frame  9/11 for premature ERI  . Hypertension   . Hyperlipidemia   . DJD (degenerative joint disease)   . GERD (gastroesophageal reflux disease)   . CAD (coronary artery disease)     nonobstructive by cath 2005- 50% mid LAD stenosis  . Hypothyroid    Past Surgical History  Procedure Date  . Pacemaker insertion 2005    implanted by Dr Reyes Ivan, generator change 9/11 by Fawn Kirk for premature ERI    Current Outpatient Prescriptions  Medication Sig Dispense Refill  . amLODipine (NORVASC) 5 MG tablet Take 5 mg by mouth daily.        Marland Kitchen aspirin 81 MG tablet Take 81 mg by mouth daily.        . ergocalciferol (VITAMIN D2) 50000 UNITS capsule Take 50,000 Units by mouth. 1 every 15 days       . fish oil-omega-3 fatty acids 1000 MG capsule Take 4 g by mouth daily.        . Garlic 1000 MG CAPS Take 2 capsules by mouth daily.        . hydrochlorothiazide (MICROZIDE) 12.5 MG capsule Take 12.5 mg by mouth daily.        Marland Kitchen latanoprost (XALATAN) 0.005 % ophthalmic solution Place 1 drop into both eyes at bedtime.        Marland Kitchen levothyroxine (SYNTHROID, LEVOTHROID) 200 MCG tablet Take 200 mcg by mouth daily.        . Methylcobalamin (B-12) 5000 MCG TBDP Take 1 tablet by mouth daily.        . Methylsulfonylmethane (MSM) 1000 MG CAPS Take 2 capsules by mouth daily.        . Multiple Vitamins-Minerals (CENTRUM SILVER PO) Take 1 tablet by mouth daily.        . nebivolol (BYSTOLIC)  5 MG tablet Take 5 mg by mouth daily.        Marland Kitchen olmesartan (BENICAR) 40 MG tablet Take 40 mg by mouth daily.        . rosuvastatin (CRESTOR) 20 MG tablet Take 20 mg by mouth daily.        . timolol (BETIMOL) 0.5 % ophthalmic solution Place 1 drop into both eyes daily. In the morning         No Known Allergies  History   Social History  . Marital Status: Widowed    Spouse Name: N/A    Number of Children: N/A  . Years of Education: N/A   Occupational History  . Not on file.   Social History Main Topics  . Smoking status: Never Smoker   . Smokeless tobacco: Not on file  . Alcohol Use: No  . Drug Use: No  . Sexually Active: Not on file   Other Topics Concern  . Not on file   Social History Narrative  . No narrative on file    Physical Exam: Filed Vitals:   12/17/10 1102  BP: 144/94  Pulse: 89  Height: 5\' 7"  (1.702  m)  Weight: 177 lb 6.4 oz (80.468 kg)    GEN- The patient is well appearing, alert and oriented x 3 today.   Head- normocephalic, atraumatic Eyes-  Sclera clear, conjunctiva pink Ears- hearing intact Oropharynx- clear Neck- supple, no JVP Lymph- no cervical lymphadenopathy Lungs- Clear to ausculation bilaterally, normal work of breathing Chest- pacemaker pocket is well healed Heart- Regular rate and rhythm, no murmurs, rubs or gallops, PMI not laterally displaced GI- soft, NT, ND, + BS Extremities- no clubbing, cyanosis, or edema MS- no significant deformity or atrophy Skin- no rash or lesion Psych- euthymic mood, full affect Neuro- strength and sensation are intact  Pacemaker interrogation- reviewed in detail today,  See PACEART report  Assessment and Plan:

## 2010-12-17 NOTE — Assessment & Plan Note (Signed)
Normal pacemaker function See Pace Art report No changes today  

## 2011-03-19 ENCOUNTER — Encounter: Payer: Medicare Other | Admitting: *Deleted

## 2011-03-25 ENCOUNTER — Encounter: Payer: Self-pay | Admitting: *Deleted

## 2011-05-22 DIAGNOSIS — I4821 Permanent atrial fibrillation: Secondary | ICD-10-CM

## 2011-05-22 HISTORY — DX: Permanent atrial fibrillation: I48.21

## 2011-06-08 ENCOUNTER — Other Ambulatory Visit: Payer: Self-pay

## 2011-06-08 ENCOUNTER — Ambulatory Visit (HOSPITAL_COMMUNITY)
Admission: RE | Admit: 2011-06-08 | Discharge: 2011-06-08 | Disposition: A | Discharge status: Home / Self Care | Payer: Medicare Other | Source: Ambulatory Visit | Attending: Internal Medicine | Admitting: Internal Medicine

## 2011-06-08 ENCOUNTER — Encounter (HOSPITAL_COMMUNITY): Payer: Self-pay | Admitting: *Deleted

## 2011-06-08 ENCOUNTER — Encounter: Payer: Self-pay | Admitting: Internal Medicine

## 2011-06-08 VITALS — BP 112/66 | HR 87 | Wt 174.5 lb

## 2011-06-08 DIAGNOSIS — R0600 Dyspnea, unspecified: Secondary | ICD-10-CM

## 2011-06-08 DIAGNOSIS — R072 Precordial pain: Secondary | ICD-10-CM | POA: Insufficient documentation

## 2011-06-08 DIAGNOSIS — I2510 Atherosclerotic heart disease of native coronary artery without angina pectoris: Secondary | ICD-10-CM | POA: Insufficient documentation

## 2011-06-08 DIAGNOSIS — R0609 Other forms of dyspnea: Secondary | ICD-10-CM | POA: Insufficient documentation

## 2011-06-08 DIAGNOSIS — R0989 Other specified symptoms and signs involving the circulatory and respiratory systems: Secondary | ICD-10-CM | POA: Insufficient documentation

## 2011-06-08 DIAGNOSIS — I4891 Unspecified atrial fibrillation: Secondary | ICD-10-CM | POA: Insufficient documentation

## 2011-06-08 MED ORDER — RIVAROXABAN 15 MG PO TABS: 15.0000 mg | ORAL_TABLET | Freq: Every day | ORAL | Status: DC

## 2011-06-08 NOTE — Patient Instructions (Signed)
I will call you later today with the dose of Xarelto  Your physician has requested that you have a lexiscan myoview. For further information please visit [email or web address] Please follow instruction sheet, as given.  Your physician has requested that you have a TEE/Cardioversion. During a TEE, sound waves are used to create images of your heart. It provides your doctor with information about the size and shape of your heart and how well your heart's chambers and valves are working. In this test, a transducer is attached to the end of a flexible tube that is guided down you throat and into your esophagus (the tube leading from your mouth to your stomach) to get a more detailed image of your heart. Once the TEE has determined that a blood clot is not present, the cardioversion begins. Electrical Cardioversion uses a jolt of electricity to your heart either through paddles or wired patches attached to your chest. This is a controlled, usually prescheduled, procedure. This procedure is done at the hospital and you are not awake during the procedure. You usually go home the day of the procedure. Please see the instruction sheet given to you today for more information.

## 2011-06-09 ENCOUNTER — Telehealth (HOSPITAL_COMMUNITY): Payer: Self-pay | Admitting: *Deleted

## 2011-06-09 ENCOUNTER — Ambulatory Visit (HOSPITAL_COMMUNITY): Payer: Medicare Other | Attending: Cardiology | Admitting: Radiology

## 2011-06-09 VITALS — BP 128/81 | Ht 67.0 in | Wt 172.0 lb

## 2011-06-09 DIAGNOSIS — R9431 Abnormal electrocardiogram [ECG] [EKG]: Secondary | ICD-10-CM

## 2011-06-09 DIAGNOSIS — I10 Essential (primary) hypertension: Secondary | ICD-10-CM

## 2011-06-09 DIAGNOSIS — I2510 Atherosclerotic heart disease of native coronary artery without angina pectoris: Secondary | ICD-10-CM | POA: Insufficient documentation

## 2011-06-09 DIAGNOSIS — R072 Precordial pain: Secondary | ICD-10-CM

## 2011-06-09 DIAGNOSIS — E785 Hyperlipidemia, unspecified: Secondary | ICD-10-CM | POA: Insufficient documentation

## 2011-06-09 DIAGNOSIS — R0602 Shortness of breath: Secondary | ICD-10-CM

## 2011-06-09 MED ORDER — TECHNETIUM TC 99M TETROFOSMIN IV KIT
11.0000 | PACK | Freq: Once | INTRAVENOUS | Status: AC | PRN
  Administered 2011-06-09: 11 via INTRAVENOUS

## 2011-06-09 MED ORDER — REGADENOSON 0.4 MG/5ML IV SOLN
0.4000 mg | Freq: Once | INTRAVENOUS | Status: AC
  Administered 2011-06-09: 0.4 mg via INTRAVENOUS

## 2011-06-09 MED ORDER — TECHNETIUM TC 99M TETROFOSMIN IV KIT
33.0000 | PACK | Freq: Once | INTRAVENOUS | Status: AC | PRN
  Administered 2011-06-09: 33 via INTRAVENOUS

## 2011-06-09 NOTE — Progress Notes (Signed)
[facility] SITE 3 NUCLEAR MED [address] [place] [address] [phone]  Cardiology Nuclear Med Study  Beth Santiago is a 78 y.o. female     MRN : [id number]     DOB: 1924/06/28  Procedure Date: 06/09/2011  Nuclear Med Background Indication for Stress Test:  Evaluation for Ischemia and Pending Surgical Clearance: Cardioversion 06/11/11- Dr. Nicholes Mango History: '05 MPS: Pacemaker, '07 Heart Cath: N/O CAD EF: 55-60% LAD 40-50% Cardiac Risk Factors: Carotid Disease, Hypertension and Lipids  Symptoms:  DOE   Nuclear Pre-Procedure Caffeine/Decaff Intake:  None NPO After: 8:00am   Lungs:  clear O2 Sat: 98% on room air . IV 0.9% NS with Angio Cath:  20g  IV Site: R Antecubital  IV Started by:  [name], EMT-P  Chest Size (in):  38 Cup Size: D  Height: 5\' 7"  (1.702 m)  Weight:  172 lb (78.019 kg)  BMI:  Body mass index is 26.94 kg/(m^2). Tech Comments:  Meds were taken as directed, per patient.    Nuclear Med Study 1 or 2 day study: 1 day  Stress Test Type:  Eugenie Birks  Reading MD: Cassell Clement, MD  Order Authorizing Provider:  D.Bensimhon MD  Resting Radionuclide: Technetium 1m Tetrofosmin  Resting Radionuclide Dose: 10.7 mCi   Stress Radionuclide:  Technetium 71m Tetrofosmin  Stress Radionuclide Dose: 32.0 mCi           Stress Protocol Rest HR: 98 Stress HR: 68  Rest BP: 128/81 Stress BP: 139/81  Exercise Time (min): n/a METS: n/a   Predicted Max HR: 136 bpm % Max HR: 50 bpm Rate Pressure Product: 9452   Dose of Adenosine (mg):  n/a Dose of Lexiscan: 0.4 mg  Dose of Atropine (mg): n/a Dose of Dobutamine: n/a mcg/kg/min (at max HR)  Stress Test Technologist: Milana Na, EMT-P  Nuclear Technologist:  Domenic Polite, CNMT     Rest Procedure:  Myocardial perfusion imaging was performed at rest 45 minutes following the intravenous administration of Technetium 33m Tetrofosmin. Rest ECG:  Wide Complex rhythm paced   Stress  Procedure:  The patient received IV Lexiscan 0.4 mg over 15-seconds.  Technetium 36m Tetrofosmin injected at 30-seconds.  There were no significant changes with Lexiscan.  Quantitative spect images were obtained after a 45 minute delay. Stress ECG: Uninteretable due to baseline LBBB  QPS Raw Data Images:  Normal; no motion artifact; normal heart/lung ratio. Stress Images:  Normal homogeneous uptake in all areas of the myocardium. Rest Images:  Normal homogeneous uptake in all areas of the myocardium. Subtraction (SDS):  No evidence of ischemia. Transient Ischemic Dilatation (Normal <1.22):  0.91 Lung/Heart Ratio (Normal <0.45):  0.37  Quantitative Gated Spect Images QGS EDV:  59 ml QGS ESV:  13 ml  Impression Exercise Capacity:  Lexiscan with no exercise. BP Response:  Normal blood pressure response. Clinical Symptoms:  No chest pain. ECG Impression:  Baseline:  LBBB.  EKG uninterpretable due to LBBB at rest and stress. Comparison with Prior Nuclear Study: No significant change from previous study  Overall Impression:  Normal stress nuclear study.  LV Ejection Fraction: 79%.  LV Wall Motion:  NL LV Function; NL Wall Motion  [employer]

## 2011-06-09 NOTE — Telephone Encounter (Signed)
Ms Eastburn called today.  She needs help getting her prescription filled (xarelto) she purchased 6 pills, however the total cost is over $300.  Her insurance will not cover the cost.  Please call her back.  Thanks.

## 2011-06-09 NOTE — Telephone Encounter (Signed)
Spoke w/pt will call insurance company tomorrow

## 2011-06-09 NOTE — Telephone Encounter (Signed)
See other phone mess dated 3/19

## 2011-06-09 NOTE — Telephone Encounter (Signed)
Please call patient, she will be home around 4pm today,  Dr B prescribed medication (blood thinner), not covered by insurance, was going to cost $300. She only purchased 6 and paid cash.  She had to go to [facility] to get prescription, she normally uses [facility] .  Please call patient.

## 2011-06-11 ENCOUNTER — Telehealth (HOSPITAL_COMMUNITY): Payer: Self-pay | Admitting: *Deleted

## 2011-06-11 ENCOUNTER — Encounter (HOSPITAL_COMMUNITY): Payer: Self-pay

## 2011-06-11 ENCOUNTER — Encounter (HOSPITAL_COMMUNITY)
Admission: RE | Disposition: A | Discharge status: Home / Self Care | Payer: Self-pay | Source: Ambulatory Visit | Attending: Internal Medicine

## 2011-06-11 ENCOUNTER — Encounter (HOSPITAL_COMMUNITY): Payer: Self-pay | Admitting: *Deleted

## 2011-06-11 ENCOUNTER — Ambulatory Visit (HOSPITAL_COMMUNITY)
Admission: RE | Admit: 2011-06-11 | Discharge: 2011-06-11 | Disposition: A | Discharge status: Home / Self Care | Payer: Medicare Other | Source: Ambulatory Visit | Attending: Internal Medicine | Admitting: Internal Medicine

## 2011-06-11 ENCOUNTER — Ambulatory Visit (HOSPITAL_COMMUNITY): Payer: Medicare Other

## 2011-06-11 DIAGNOSIS — I4891 Unspecified atrial fibrillation: Secondary | ICD-10-CM

## 2011-06-11 DIAGNOSIS — I10 Essential (primary) hypertension: Secondary | ICD-10-CM | POA: Insufficient documentation

## 2011-06-11 DIAGNOSIS — I319 Disease of pericardium, unspecified: Secondary | ICD-10-CM | POA: Insufficient documentation

## 2011-06-11 DIAGNOSIS — R0600 Dyspnea, unspecified: Secondary | ICD-10-CM | POA: Insufficient documentation

## 2011-06-11 DIAGNOSIS — I2510 Atherosclerotic heart disease of native coronary artery without angina pectoris: Secondary | ICD-10-CM | POA: Insufficient documentation

## 2011-06-11 DIAGNOSIS — I059 Rheumatic mitral valve disease, unspecified: Secondary | ICD-10-CM | POA: Insufficient documentation

## 2011-06-11 DIAGNOSIS — I700 Atherosclerosis of aorta: Secondary | ICD-10-CM | POA: Insufficient documentation

## 2011-06-11 DIAGNOSIS — Z950 Presence of cardiac pacemaker: Secondary | ICD-10-CM | POA: Insufficient documentation

## 2011-06-11 HISTORY — PX: TEE WITHOUT CARDIOVERSION: SHX5443

## 2011-06-11 HISTORY — PX: CARDIOVERSION: SHX1299

## 2011-06-11 SURGERY — ECHOCARDIOGRAM, TRANSESOPHAGEAL
Anesthesia: Moderate Sedation

## 2011-06-11 MED ORDER — SODIUM CHLORIDE 0.9 % IJ SOLN: 3.0000 mL | INTRAMUSCULAR | Status: DC | PRN

## 2011-06-11 MED ORDER — SODIUM CHLORIDE 0.9 % IJ SOLN: 3.0000 mL | Freq: Two times a day (BID) | INTRAMUSCULAR | Status: DC

## 2011-06-11 MED ORDER — PROPOFOL 10 MG/ML IV BOLUS
INTRAVENOUS | Status: DC | PRN
  Administered 2011-06-11: 50 mg via INTRAVENOUS

## 2011-06-11 MED ORDER — MIDAZOLAM HCL 10 MG/2ML IJ SOLN
INTRAMUSCULAR | Status: AC
  Filled 2011-06-11: qty 2

## 2011-06-11 MED ORDER — BENZOCAINE 20 % MT SOLN: 1.0000 "application " | OROMUCOSAL | Status: DC | PRN

## 2011-06-11 MED ORDER — SODIUM CHLORIDE 0.45 % IV SOLN: INTRAVENOUS | Status: DC

## 2011-06-11 MED ORDER — MIDAZOLAM HCL 10 MG/2ML IJ SOLN
INTRAMUSCULAR | Status: DC | PRN
  Administered 2011-06-11 (×2): 2 mg via INTRAVENOUS

## 2011-06-11 MED ORDER — FENTANYL CITRATE 0.05 MG/ML IJ SOLN: 250.0000 ug | Freq: Once | INTRAMUSCULAR | Status: DC

## 2011-06-11 MED ORDER — FENTANYL CITRATE 0.05 MG/ML IJ SOLN
INTRAMUSCULAR | Status: DC | PRN
  Administered 2011-06-11: 25 ug via INTRAVENOUS

## 2011-06-11 MED ORDER — BUTAMBEN-TETRACAINE-BENZOCAINE 2-2-14 % EX AERO
INHALATION_SPRAY | CUTANEOUS | Status: DC | PRN
  Administered 2011-06-11: 2 via TOPICAL

## 2011-06-11 MED ORDER — FENTANYL CITRATE 0.05 MG/ML IJ SOLN
INTRAMUSCULAR | Status: AC
  Filled 2011-06-11: qty 2

## 2011-06-11 MED ORDER — SODIUM CHLORIDE 0.9 % IV SOLN: 250.0000 mL | INTRAVENOUS | Status: DC | PRN

## 2011-06-11 MED ORDER — MIDAZOLAM HCL 10 MG/2ML IJ SOLN: 10.0000 mg | Freq: Once | INTRAMUSCULAR | Status: DC

## 2011-06-11 NOTE — Op Note (Addendum)
    TRANSESOPHAGEAL ECHOCARDIOGRAM an DC CARDIOVERSION  NAME:  Beth Santiago   MRN: [id number] DOB:  1925-03-10   ADMIT DATE: 06/11/2011  INDICATIONS: Symptomatic atrial fib   PROCEDURE:   Informed consent was obtained prior to the procedure. The risks, benefits and alternatives for the procedure were discussed and the patient comprehended these risks.  Risks include, but are not limited to, cough, sore throat, vomiting, nausea, somnolence, esophageal and stomach trauma or perforation, bleeding, low blood pressure, aspiration, pneumonia, infection, trauma to the teeth and death.    After a procedural time-out, the patient was given 4 mg versed and 25 mcg fentanyl for moderate sedation.  The oropharynx was anesthetized 10 cc of topical 1% viscous lidocaine.  The transesophageal probe was inserted in the esophagus and stomach without difficulty and multiple views were obtained.   After the TEE was complete patient was further sedated by Dr. Jean Rosenthal with IV propafol. Once adequate sedation was achieved patient received a synchronized bi[phasic shock with prompt conversion to SR which was confirmed by intracardiac electrograms during pacer interrogation.    COMPLICATIONS:    There were no immediate complications.  FINDINGS:  LEFT VENTRICLE: EF = 55%   RIGHT VENTRICLE: Normal size and function. + pacer  LEFT ATRIUM: Moderate to severely dilated  LEFT ATRIAL APPENDAGE: No thrombus  RIGHT ATRIUM:Moderate to severely dilated  AORTIC VALVE:  Trileaflet. Mildly thickened. No AI/AS  MITRAL VALVE:  Moderate MR  TRICUSPID VALVE: Normal. Mild TR  PULMONIC VALVE: Grossly normal  PERICARDIUM: Trivial effusion  DESCENDING AORTA: Moderate plaque   CONCLUSION:  No LAA thrombus. Moderate to severe biatrial enlargement suggestive of restrictive physiology.  Successful DC-CV. Given significant biatrial enlargement she is at high-risk for recurrent AF and may need anti-arrhythmic  therapy in the future.   [name],MD 2:15 PM

## 2011-06-11 NOTE — Assessment & Plan Note (Signed)
We interrogated her pacer today in clinic and she has been in AF for 3 weeks and I suspect this is the source of her symptoms. I explained this to her and her family at length. WE talked about 2 approached. Starting antin-coagulation now and waiting 4 weeks or doing TEE-DC-CV. Given how symptomatic she is, she prefers the latter. We also discussed the various agents for anti-coagulation and we have settled on Xarelto. Will start Xarelto and plan TEE DC-CV after 3rd dose.

## 2011-06-11 NOTE — Progress Notes (Signed)
Patient ID: Beth Santiago, female   DOB: 06/04/1924, 78 y.o.   MRN: [id number]  HPI:  Beth Santiago is an 78 y/o woman with a h/o HTN, HL, CHB s/p pacer and non-obstructive CAD by cath in 2007. Referred by Dr. South for further evaluation of fatigue, dyspnea and chest twinges.  1. Left Main: Normal.  2. LAD: Mild proximal luminal irregularities with mid 40 to 50% stenosis  at bifurcation with first septal perforator. Distal vessel has mild  luminal irregularity.  3. D1: Moderate size, high branching, with mild luminal irregularities.  4. LCX: Nondominant with mild luminal irregularities.  5. RCA: Dominant with mild luminal irregularity.  6. PDA and PLV: Moderate size vessels and mild luminal irregularity.  7. LV: EF is 55 to 60%. No significant wall motion abnormalities. LVEDP  was 9-mmHg.   Says she had been doing well until a week or two ago and has just been more fatigued and feels she gets dyspneic easier. Also occasional chest twinges. Denies palpitations, orthopnea, PND, edema. Saw Dr. South recently and had w/u which was normal except for UTI which has been treated.    ROS: All systems negative except as listed in HPI, PMH and Problem List.  Past Medical History  Diagnosis Date  . Complete heart block     s/p PPM by Dr Kersey 2005, generator change by Dr Allred  9/11 for premature ERI  . Hypertension   . Hyperlipidemia   . DJD (degenerative joint disease)   . GERD (gastroesophageal reflux disease)   . CAD (coronary artery disease)     nonobstructive by cath 2005- 50% mid LAD stenosis  . Hypothyroid     Current Outpatient Prescriptions  Medication Sig Dispense Refill  . aspirin 81 MG tablet Take 81 mg by mouth daily.        . ciprofloxacin (CIPRO) 500 MG tablet Take 500 mg by mouth 2 (two) times daily.      . fish oil-omega-3 fatty acids 1000 MG capsule Take 2 g by mouth daily.       . Garlic 1000 MG CAPS Take 2 capsules by mouth daily.        . Glucosamine HCl 1000 MG  TABS Take 1 tablet by mouth daily.      . latanoprost (XALATAN) 0.005 % ophthalmic solution Place 1 drop into both eyes at bedtime.        . levothyroxine (SYNTHROID, LEVOTHROID) 200 MCG tablet Take 200 mcg by mouth daily.        . Multiple Vitamins-Minerals (CENTRUM SILVER PO) Take 1 tablet by mouth daily.        . nebivolol (BYSTOLIC) 5 MG tablet Take 2.5 mg by mouth daily.       . Olmesartan-Amlodipine-HCTZ (TRIBENZOR) 40-5-12.5 MG TABS Take 1 tablet by mouth daily.      . rosuvastatin (CRESTOR) 20 MG tablet Take 20 mg by mouth daily.        . timolol (BETIMOL) 0.5 % ophthalmic solution Place 1 drop into both eyes daily. In the morning       . Rivaroxaban (XARELTO) 15 MG TABS tablet Take 1 tablet (15 mg total) by mouth daily.  30 tablet  6     PHYSICAL EXAM: Filed Vitals:   06/08/11 1127  BP: 112/66  Pulse: 87   Weight change:  General:  Well appearing. Looks younger than stated age.  No resp difficulty HEENT: normal Neck: supple. JVP flat. Carotids 2+ bilaterally; no bruits. No lymphadenopathy or thryomegaly   appreciated. Cor: PMI normal. Regular rate & rhythm. No rubs, gallops or murmurs. Lungs: clear Abdomen: soft, nontender, nondistended. No hepatosplenomegaly. No bruits or masses. Good bowel sounds. Extremities: no cyanosis, clubbing, rash, edema Neuro: alert & orientedx3, cranial nerves grossly intact. Moves all 4 extremities w/o difficulty. Affect pleasant.    ECG: AF with v-pacing  Device interrogated in clinic: Normal function. AF x 3 weeks.    ASSESSMENT & PLAN:  

## 2011-06-11 NOTE — Anesthesia Postprocedure Evaluation (Signed)
  Anesthesia Post-op Note  Patient: Beth Santiago  Procedure(s) Performed: Procedure(s) (LRB): TRANSESOPHAGEAL ECHOCARDIOGRAM (TEE) (N/A) CARDIOVERSION (N/A)  Patient Location: Endoscopy Unit  Anesthesia Type: General  Level of Consciousness: awake, alert , oriented and patient cooperative  Airway and Oxygen Therapy: Patient Spontanous Breathing and Patient connected to nasal cannula oxygen  Post-op Pain: none  Post-op Assessment: Post-op Vital signs reviewed, Patient's Cardiovascular Status Stable, Respiratory Function Stable, Patent Airway and No signs of Nausea or vomiting  Post-op Vital Signs: Reviewed and stable  Complications: No apparent anesthesia complications

## 2011-06-11 NOTE — H&P (View-Only) (Signed)
Patient ID: Beth Santiago, female   DOB: [birth year], 78 y.o.   MRN: [id number]  HPI:  Beth Santiago is an 78 y/o woman with a h/o HTN, HL, CHB s/p pacer and non-obstructive CAD by cath in 2007. Referred by Beth Santiago for further evaluation of fatigue, dyspnea and chest twinges.  1. Left Main: Normal.  2. LAD: Mild proximal luminal irregularities with mid 40 to 50% stenosis  at bifurcation with first septal perforator. Distal vessel has mild  luminal irregularity.  3. D1: Moderate size, high branching, with mild luminal irregularities.  4. LCX: Nondominant with mild luminal irregularities.  5. RCA: Dominant with mild luminal irregularity.  6. PDA and PLV: Moderate size vessels and mild luminal irregularity.  7. LV: EF is 55 to 60%. No significant wall motion abnormalities. LVEDP  was 9-mmHg.   Says she had been doing well until a week or two ago and has just been more fatigued and feels she gets dyspneic easier. Also occasional chest twinges. Denies palpitations, orthopnea, PND, edema. Saw Beth Santiago recently and had w/u which was normal except for UTI which has been treated.    ROS: All systems negative except as listed in HPI, PMH and Problem List.  Past Medical History  Diagnosis Date  . Complete heart block     s/p PPM by Dr Reyes Ivan 2005, generator change by Dr Johney Frame  9/11 for premature ERI  . Hypertension   . Hyperlipidemia   . DJD (degenerative joint disease)   . GERD (gastroesophageal reflux disease)   . CAD (coronary artery disease)     nonobstructive by cath 2005- 50% mid LAD stenosis  . Hypothyroid     Current Outpatient Prescriptions  Medication Sig Dispense Refill  . aspirin 81 MG tablet Take 81 mg by mouth daily.        . ciprofloxacin (CIPRO) 500 MG tablet Take 500 mg by mouth 2 (two) times daily.      . fish oil-omega-3 fatty acids 1000 MG capsule Take 2 g by mouth daily.       . Garlic 1000 MG CAPS Take 2 capsules by mouth daily.        . Glucosamine HCl 1000 MG  TABS Take 1 tablet by mouth daily.      Marland Kitchen latanoprost (XALATAN) 0.005 % ophthalmic solution Place 1 drop into both eyes at bedtime.        Marland Kitchen levothyroxine (SYNTHROID, LEVOTHROID) 200 MCG tablet Take 200 mcg by mouth daily.        . Multiple Vitamins-Minerals (CENTRUM SILVER PO) Take 1 tablet by mouth daily.        . nebivolol (BYSTOLIC) 5 MG tablet Take 2.5 mg by mouth daily.       . Olmesartan-Amlodipine-HCTZ (TRIBENZOR) 40-5-12.5 MG TABS Take 1 tablet by mouth daily.      . rosuvastatin (CRESTOR) 20 MG tablet Take 20 mg by mouth daily.        . timolol (BETIMOL) 0.5 % ophthalmic solution Place 1 drop into both eyes daily. In the morning       . Rivaroxaban (XARELTO) 15 MG TABS tablet Take 1 tablet (15 mg total) by mouth daily.  30 tablet  6     PHYSICAL EXAM: Filed Vitals:   06/08/11 1127  BP: 112/66  Pulse: 87   Weight change:  General:  Well appearing. Looks younger than stated age.  No resp difficulty HEENT: normal Neck: supple. JVP flat. Carotids 2+ bilaterally; no bruits. No lymphadenopathy or thryomegaly  appreciated. Cor: PMI normal. Regular rate & rhythm. No rubs, gallops or murmurs. Lungs: clear Abdomen: soft, nontender, nondistended. No hepatosplenomegaly. No bruits or masses. Good bowel sounds. Extremities: no cyanosis, clubbing, rash, edema Neuro: alert & orientedx3, cranial nerves grossly intact. Moves all 4 extremities w/o difficulty. Affect pleasant.    ECG: AF with v-pacing  Device interrogated in clinic: Normal function. AF x 3 weeks.    ASSESSMENT & PLAN:

## 2011-06-11 NOTE — Preoperative (Signed)
Beta Blockers   Reason not to administer Beta Blockers:Not Applicable 

## 2011-06-11 NOTE — Assessment & Plan Note (Signed)
Having occasional chest twinges. Given exertional symptoms and h.o CAD will get Myoview to exclude ischemia as trigger for AF or contributing factor for symptoms.

## 2011-06-11 NOTE — OR Nursing (Signed)
Cardioversion performed at 1431, 150 joules.

## 2011-06-11 NOTE — Discharge Instructions (Signed)
Electrical Cardioversion Cardioversion is the delivery of a jolt of electricity to change the rhythm of the heart. Sticky patches or metal paddles are placed on the chest to deliver the electricity from a special device. This is done to restore a normal rhythm. A rhythm that is too fast or not regular keeps the heart from pumping well. Compared to medicines used to change an abnormal rhythm, cardioversion is faster and works better. It is also unpleasant and may dislodge blood clots from the heart. WHEN WOULD THIS BE DONE?  In an emergency:   There is low or no blood pressure as a result of the heart rhythm.   Normal rhythm must be restored as fast as possible to protect the brain and heart from further damage.   It may save a life.   For less serious heart rhythms, such as atrial fibrillation or flutter, in which:   The heart is beating too fast or is not regular.   The heart is still able to pump enough blood, but not as well as it should.   Medicine to change the rhythm has not worked.   It is safe to wait in order to allow time for preparation.  LET YOUR CAREGIVER KNOW ABOUT:   Every medicine you are taking. It is very important to do this! Know when to take or stop taking any of them.   Any time in the past that you have felt your heart was not beating normally.  RISKS AND COMPLICATIONS   Clots may form in the chambers of the heart if it is beating too fast. These clots may be dislodged during the procedure and travel to other parts of the body.   There is risk of a stroke during and after the procedure if a clot moves. Blood thinners lower this risk.   You may have a special test of your heart (TEE) to make sure there are no clots in your heart.  BEFORE THE PROCEDURE   You may have some tests to see how well your heart is working.   You may start taking blood thinners so your blood does not clot as easily.   Other drugs may be given to help your heart work better.   PROCEDURE (SCHEDULED)  The procedure is typically done in a hospital by a heart doctor (cardiologist).   You will be told when and where to go.   You may be given some medicine through an intravenous (IV) access to reduce discomfort and make you sleepy before the procedure.   Your whole body may move when the shock is delivered. Your chest may feel sore.   You may be able to go home after a few hours. Your heart rhythm will be watched to make sure it does not change.  HOME CARE INSTRUCTIONS   Only take medicine as directed by your caregiver. Be sure you understand how and when to take your medicine.   Learn how to feel your pulse and check it often.   Limit your activity for 48 hours.   Avoid caffeine and other stimulants as directed.  SEEK MEDICAL CARE IF:   You feel like your heart is beating too fast or your pulse is not regular.   You have any questions about your medicines.   You have bleeding that will not stop.  SEEK IMMEDIATE MEDICAL CARE IF:   You are dizzy or feel faint.   It is hard to breathe or you feel short of breath.     There is a change in discomfort in your chest.   Your speech is slurred or you have trouble moving your arm or leg on one side.   You get a muscle cramp.   Your fingers or toes turn cold or blue.  MAKE SURE YOU:   Understand these instructions.   Will watch your condition.   Will get help right away if you are not doing well or get worse.  Document Released: 02/27/2002 Document Revised: 02/26/2011 Document Reviewed: 06/29/2007 [employer] Patient Information 2012 [employer]. 

## 2011-06-11 NOTE — Transfer of Care (Signed)
Immediate Anesthesia Transfer of Care Note  Patient: Beth Santiago  Procedure(s) Performed: Procedure(s) (LRB): TRANSESOPHAGEAL ECHOCARDIOGRAM (TEE) (N/A) CARDIOVERSION (N/A)  Patient Location: Endoscopy Unit  Anesthesia Type: General  Level of Consciousness: awake, alert , oriented and patient cooperative  Airway & Oxygen Therapy: Patient Spontanous Breathing and Patient connected to nasal cannula oxygen  Post-op Assessment: Report given to PACU RN, Post -op Vital signs reviewed and stable and Patient moving all extremities  Post vital signs: Reviewed and stable  Complications: No apparent anesthesia complications

## 2011-06-11 NOTE — Telephone Encounter (Signed)
See phone mess 3/21

## 2011-06-11 NOTE — Interval H&P Note (Signed)
History and Physical Interval Note:  06/11/2011 1:53 PM  Beth Santiago  has presented today for surgery, with the diagnosis of a-fib  The various methods of treatment have been discussed with the patient and family. After consideration of risks, benefits and other options for treatment, the patient has consented to  Procedure(s) (LRB): TRANSESOPHAGEAL ECHOCARDIOGRAM (TEE) (N/A) CARDIOVERSION (N/A) as a surgical intervention .  The patients' history has been reviewed, patient examined, no change in status, stable for surgery.  I have reviewed the patients' chart and labs.  Questions were answered to the patient's satisfaction.    She has had 3 doses of Xarelto and understands need for at least 1 month of anti-coagulation after DC-CV.   Rube Sanchez

## 2011-06-11 NOTE — Telephone Encounter (Signed)
Called pt's insurance company at [phone] for PA for Xarelto and med was approved pt and pharmacy aware

## 2011-06-11 NOTE — OR Nursing (Signed)
Paper discharge instructions for TEE given to pt's sister.

## 2011-06-11 NOTE — Assessment & Plan Note (Signed)
As above. New onset AF. Planning anti-coagulation and DC-CV.

## 2011-06-11 NOTE — Anesthesia Preprocedure Evaluation (Addendum)
Anesthesia Evaluation  Patient identified by MRN, date of birth, ID band Patient awake    Reviewed: Allergy & Precautions, H&P , NPO status , Patient's Chart, lab work & pertinent test results, reviewed documented beta blocker date and time   History of Anesthesia Complications Negative for: history of anesthetic complications  Airway Mallampati: II TM Distance: >3 FB Neck ROM: Full    Dental  (+) Teeth Intact and Dental Advisory Given   Pulmonary shortness of breath,  breath sounds clear to auscultation  Pulmonary exam normal       Cardiovascular hypertension, Pt. on home beta blockers + CAD + dysrhythmias Atrial Fibrillation Rhythm:Irregular Rate:Normal  TEE normal LVF, EF 55%   Neuro/Psych negative neurological ROS  negative psych ROS   GI/Hepatic Neg liver ROS, GERD-  Medicated and Controlled,  Endo/Other  Hypothyroidism   Renal/GU negative Renal ROS     Musculoskeletal   Abdominal (+) + obese,   Peds  Hematology   Anesthesia Other Findings   Reproductive/Obstetrics                          Anesthesia Physical Anesthesia Plan  ASA: III  Anesthesia Plan: General   Post-op Pain Management:    Induction: Intravenous  Airway Management Planned: Mask  Additional Equipment:   Intra-op Plan:   Post-operative Plan:   Informed Consent: I have reviewed the patients History and Physical, chart, labs and discussed the procedure including the risks, benefits and alternatives for the proposed anesthesia with the patient or authorized representative who has indicated his/her understanding and acceptance.   Dental advisory given  Plan Discussed with: CRNA and Surgeon  Anesthesia Plan Comments: (Plan routine monitors, GA  )       Anesthesia Quick Evaluation

## 2011-06-12 ENCOUNTER — Encounter (HOSPITAL_COMMUNITY): Payer: Self-pay | Admitting: Internal Medicine

## 2011-06-15 ENCOUNTER — Other Ambulatory Visit (HOSPITAL_COMMUNITY): Payer: Medicare Other

## 2011-07-01 ENCOUNTER — Encounter (HOSPITAL_COMMUNITY): Payer: Self-pay

## 2011-08-17 ENCOUNTER — Encounter: Payer: Self-pay | Admitting: *Deleted

## 2011-10-01 ENCOUNTER — Ambulatory Visit (INDEPENDENT_AMBULATORY_CARE_PROVIDER_SITE_OTHER): Payer: Medicare Other | Admitting: *Deleted

## 2011-10-01 ENCOUNTER — Encounter: Payer: Self-pay | Admitting: Internal Medicine

## 2011-10-01 DIAGNOSIS — I442 Atrioventricular block, complete: Secondary | ICD-10-CM

## 2011-10-01 LAB — PACEMAKER DEVICE OBSERVATION
AL IMPEDENCE PM: 655 Ohm
ATRIAL PACING PM: 56.5
BATTERY VOLTAGE: 2.78 V
RV LEAD IMPEDENCE PM: 590 Ohm
VENTRICULAR PACING PM: 100

## 2011-10-01 NOTE — Progress Notes (Signed)
PPM check 

## 2011-11-27 ENCOUNTER — Encounter: Payer: Self-pay | Admitting: *Deleted

## 2011-11-30 ENCOUNTER — Encounter: Payer: Self-pay | Admitting: Cardiology

## 2011-12-29 ENCOUNTER — Encounter: Payer: Self-pay | Admitting: *Deleted

## 2012-01-04 ENCOUNTER — Encounter: Payer: Self-pay | Admitting: Internal Medicine

## 2012-01-04 ENCOUNTER — Ambulatory Visit (INDEPENDENT_AMBULATORY_CARE_PROVIDER_SITE_OTHER): Payer: Medicare Other | Admitting: Internal Medicine

## 2012-01-04 VITALS — BP 131/66 | HR 76 | Ht 67.0 in | Wt 166.0 lb

## 2012-01-04 DIAGNOSIS — I10 Essential (primary) hypertension: Secondary | ICD-10-CM

## 2012-01-04 DIAGNOSIS — I442 Atrioventricular block, complete: Secondary | ICD-10-CM

## 2012-01-04 DIAGNOSIS — I4891 Unspecified atrial fibrillation: Secondary | ICD-10-CM

## 2012-01-04 LAB — CBC WITH DIFFERENTIAL/PLATELET
Basophils Relative: 0.6 % (ref 0.0–3.0)
Eosinophils Absolute: 0.2 10*3/uL (ref 0.0–0.7)
Hemoglobin: 12.4 g/dL (ref 12.0–15.0)
MCHC: 33.2 g/dL (ref 30.0–36.0)
MCV: 94.6 fl (ref 78.0–100.0)
Monocytes Absolute: 0.7 10*3/uL (ref 0.1–1.0)
Neutro Abs: 5.3 10*3/uL (ref 1.4–7.7)
RBC: 3.95 Mil/uL (ref 3.87–5.11)

## 2012-01-04 LAB — PACEMAKER DEVICE OBSERVATION
BATTERY VOLTAGE: 2.78 V
VENTRICULAR PACING PM: 100

## 2012-01-04 LAB — BASIC METABOLIC PANEL
CO2: 27 mEq/L (ref 19–32)
Chloride: 97 mEq/L (ref 96–112)
Sodium: 132 mEq/L — ABNORMAL LOW (ref 135–145)

## 2012-01-04 NOTE — Progress Notes (Signed)
PCP: [name], MD Primary Cardiologist:  Dr Gala Romney  Beth Santiago is a 78 y.o. female who presents today for routine electrophysiology followup.  Since last being seen in our clinic, the patient reports doing very well.  She remains very active for her age.  Today, she denies symptoms of palpitations, chest pain, shortness of breath,  lower extremity edema, dizziness, presyncope, or syncope.  The patient is otherwise without complaint today.  She is tolerating xarelto without bleeding.  Past Medical History  Diagnosis Date  . Complete heart block     s/p PPM by Dr Reyes Ivan 2005, generator change by Dr Johney Frame  9/11 for premature ERI  . Hypertension   . Hyperlipidemia   . DJD (degenerative joint disease)   . GERD (gastroesophageal reflux disease)   . CAD (coronary artery disease)     nonobstructive by cath 2005- 50% mid LAD stenosis  . Hypothyroid   . Paroxysmal atrial fibrillation   . Biatrial enlargement     severe   Past Surgical History  Procedure Date  . Pacemaker insertion 2005, 12/11/09    implanted by Dr Reyes Ivan, generator change 9/11 by Fawn Kirk for premature ERI  . Tonsillectomy   . Colon surgery   . Hemorroidectomy   . Tee without cardioversion 06/11/2011    Procedure: TRANSESOPHAGEAL ECHOCARDIOGRAM (TEE);  Surgeon: Dolores Patty, MD;  Location: [facility] ENDOSCOPY;  Service: Cardiovascular;  Laterality: N/A;  . Cardioversion 06/11/2011    Procedure: CARDIOVERSION;  Surgeon: Dolores Patty, MD;  Location: [facility] ENDOSCOPY;  Service: Cardiovascular;  Laterality: N/A;    Current Outpatient Prescriptions  Medication Sig Dispense Refill  . fish oil-omega-3 fatty acids 1000 MG capsule Take 2 g by mouth daily.       . Garlic 1000 MG CAPS Take 2 capsules by mouth daily.        . Glucosamine HCl 1000 MG TABS Take 1 tablet by mouth daily.      Marland Kitchen latanoprost (XALATAN) 0.005 % ophthalmic solution Place 1 drop into both eyes at bedtime.        Marland Kitchen levothyroxine (SYNTHROID,  LEVOTHROID) 200 MCG tablet Take 200 mcg by mouth daily.        . Multiple Vitamins-Minerals (CENTRUM SILVER PO) Take 1 tablet by mouth daily.        . nebivolol (BYSTOLIC) 5 MG tablet Take 2.5 mg by mouth daily.       . Olmesartan-Amlodipine-HCTZ (TRIBENZOR) 40-5-12.5 MG TABS Take 1 tablet by mouth daily.      . Rivaroxaban (XARELTO) 15 MG TABS tablet Take 1 tablet (15 mg total) by mouth daily.  30 tablet  6  . rosuvastatin (CRESTOR) 20 MG tablet Take 20 mg by mouth daily.        . timolol (BETIMOL) 0.5 % ophthalmic solution Place 1 drop into both eyes daily. In the morning       . DISCONTD: amLODipine (NORVASC) 5 MG tablet Take 5 mg by mouth daily.        Marland Kitchen DISCONTD: hydrochlorothiazide (MICROZIDE) 12.5 MG capsule Take 12.5 mg by mouth daily.        Marland Kitchen DISCONTD: olmesartan (BENICAR) 40 MG tablet Take 40 mg by mouth daily.          Physical Exam: Filed Vitals:   01/04/12 0958  BP: 131/66  Pulse: 76  Height: 5\' 7"  (1.702 m)  Weight: 166 lb (75.297 kg)    GEN- The patient is well appearing, alert and oriented x 3 today.  Head- normocephalic, atraumatic Eyes-  Sclera clear, conjunctiva pink Ears- hearing intact Oropharynx- clear Lungs- Clear to ausculation bilaterally, normal work of breathing Chest- pacemaker pocket is well healed Heart- Regular rate and rhythm, no murmurs, rubs or gallops, PMI not laterally displaced GI- soft, NT, ND, + BS Extremities- no clubbing, cyanosis, or edema  Pacemaker interrogation- reviewed in detail today,  See PACEART report  Assessment and Plan:  1. Complete heart block Normal pacemaker function See Pace Art report No changes today  2. Afib Maintaining sinus rhythm Continue xarelto Check BMET and CBC today She will be enrolled in the anticoagulation clinic for follow-up  3. HTN Stable bmet today

## 2012-01-04 NOTE — Patient Instructions (Signed)
Your physician recommends that you schedule a follow-up appointment in: 2 months with Anti-coag clinic Your physician recommends that you schedule a follow-up appointment in: 6 months with Device clinic  Your physician wants you to follow-up in: 12 months with Dr Johney Frame.  You will receive a reminder letter in the mail two months in advance. If you don't receive a letter, please call our office to schedule the follow-up appointment.  Your physician recommends that you have for lab work drawn today -- BMP & CBC Your physician has recommended you make the following change in your medication: STOP Aspirin

## 2012-03-09 ENCOUNTER — Ambulatory Visit (INDEPENDENT_AMBULATORY_CARE_PROVIDER_SITE_OTHER): Payer: Medicare Other | Admitting: *Deleted

## 2012-03-09 DIAGNOSIS — I4891 Unspecified atrial fibrillation: Secondary | ICD-10-CM

## 2012-03-09 MED ORDER — RIVAROXABAN 20 MG PO TABS: 20.0000 mg | ORAL_TABLET | Freq: Every day | ORAL | Status: DC

## 2012-03-09 NOTE — Progress Notes (Signed)
Pt was started on Xarelto  15mg  daily  for  Atrial Fib  on  March 2103.    Reviewed patients medication list.  Pt is not currently on any combined P-gp and strong CYP3A4 inhibitors/inducers (ketoconazole, traconazole, ritonavir, carbamazepine, phenytoin, rifampin, St. John's wort).  Reviewed labs.  SCr 0.7  , Weight 75.29 kg , CrCl-69.83 .  Dose changed to Xarelto 20 mg daily per order of Dr Johney Frame    Hgb 12.4 and HCT 37.3  A full discussion of the nature of anticoagulants has been carried out.  A benefit/risk analysis has been presented to the patient, so that they understand the justification for choosing anticoagulation with Xarelto at this time.  The need for compliance is stressed.  Pt is aware to take the medication once daily with the largest meal of the day.  Side effects of potential bleeding are discussed, including unusual colored urine or stools, coughing up blood or coffee ground emesis, nose bleeds or serious fall or head trauma.  Discussed signs and symptoms of stroke. The patient should avoid any OTC items containing aspirin or ibuprofen.  Avoid alcohol consumption.   Call if any signs of abnormal bleeding.  Discussed financial obligations and resolved any difficulty in obtaining medication.  Next lab test test in 1 month due to dose change of Xarelto. Pt verbalized understanding of above and appt made for 1 month.

## 2012-04-06 ENCOUNTER — Ambulatory Visit (INDEPENDENT_AMBULATORY_CARE_PROVIDER_SITE_OTHER): Payer: Medicare Other | Admitting: *Deleted

## 2012-04-06 DIAGNOSIS — I4891 Unspecified atrial fibrillation: Secondary | ICD-10-CM

## 2012-04-06 LAB — BASIC METABOLIC PANEL
BUN: 12 mg/dL (ref 6–23)
Calcium: 9.6 mg/dL (ref 8.4–10.5)
GFR: 85.88 mL/min (ref >60.00)
Potassium: 5.7 mEq/L — ABNORMAL HIGH (ref 3.5–5.1)

## 2012-04-06 LAB — CBC
MCHC: 33.5 g/dL (ref 30.0–36.0)
MCV: 92.9 fl (ref 78.0–100.0)
Platelets: 290 10*3/uL (ref 150.0–400.0)

## 2012-04-06 NOTE — Progress Notes (Signed)
Pt was started on Xarelto 15mg  daily for Atrial Fib in March 2013.  Then was instructed to start Xarelto 20 mg daily on 03/09/2013 and pt states has taken 15mg  one day and 20 mg next day  and states she has taken more 15mg   than 20 mg daily Therefore will get labs today and then have her come back again in one month for CBC and BMET and pt instucted to take Xarelto 20 mg daily and no more 15mg  tablets Pt states understanding Reviewed patients medication list.  Pt  Is not  currently on any combined P-gp and strong CYP3A4 inhibitors/inducers (ketoconazole, traconazole, ritonavir, carbamazepine, phenytoin, rifampin, St. John's wort).  Reviewed labs.  SCr 0.7, Weight 75.297, CrCl-69.84.  Dose is appropriate  based on CrCl.   Hgb 12.9 and HCT 38.4 On BMET 04/06/12 K+ level is elevated at 5.7 this is an increase from 4.7 12/2011.  The specimen was not hemolyzed.  The pt is not on potassium supplement.  Results discussed with Dr.Allred, and with pt.  She will eat low potassium diet for the next 2 days and recheck BMET on fri 04/08/2012.  A full discussion of the nature of anticoagulants has been carried out.  A benefit/risk analysis has been presented to the patient, so that they understand the justification for choosing anticoagulation with Xarelto at this time.  The need for compliance is stressed.  Pt is aware to take the medication once daily with the largest meal of the day.  Side effects of potential bleeding are discussed, including unusual colored urine or stools, coughing up blood or coffee ground emesis, nose bleeds or serious fall or head trauma.  Discussed signs and symptoms of stroke. The patient should avoid any OTC items containing aspirin or ibuprofen.  Avoid alcohol consumption.   Call if any signs of abnormal bleeding.  Discussed financial obligations and resolved any difficulty in obtaining medication.  Next lab test test in1 month.  Faxed lab results to Dr Adrian Prince at pts request

## 2012-04-08 ENCOUNTER — Ambulatory Visit (INDEPENDENT_AMBULATORY_CARE_PROVIDER_SITE_OTHER): Payer: Medicare Other | Admitting: *Deleted

## 2012-04-08 DIAGNOSIS — I10 Essential (primary) hypertension: Secondary | ICD-10-CM

## 2012-04-08 DIAGNOSIS — I2510 Atherosclerotic heart disease of native coronary artery without angina pectoris: Secondary | ICD-10-CM

## 2012-04-08 LAB — BASIC METABOLIC PANEL
CO2: 27 mEq/L (ref 19–32)
Calcium: 9.6 mg/dL (ref 8.4–10.5)
GFR: 97.16 mL/min (ref >60.00)
Sodium: 131 mEq/L — ABNORMAL LOW (ref 135–145)

## 2012-04-11 ENCOUNTER — Telehealth: Payer: Self-pay | Admitting: Pharmacist

## 2012-04-11 NOTE — Telephone Encounter (Signed)
Beth Santiago came into the office for follow up BMET  d/t to elevated K+ 5.7 on last BMET.  She reports eating a diet higher in potassium.  She has increased 5-7 prunes nightly to keep her BM regular.  After not eating any prunes tfor 3 days, her K+ is back WNL.  She is willing to decrease prunes to 3 qod and alternatives were provided to help her maintain regularity.  She has follow up BMET and CBC 05/12/12.   Leota Sauers Pharm.D. CPP, BCPS Clinical Pharmacist [phone] 04/11/2012 2:11 PM

## 2012-04-28 ENCOUNTER — Other Ambulatory Visit: Payer: Self-pay | Admitting: *Deleted

## 2012-04-28 MED ORDER — RIVAROXABAN 20 MG PO TABS: 20.0000 mg | ORAL_TABLET | Freq: Every day | ORAL | Status: DC

## 2012-05-12 ENCOUNTER — Ambulatory Visit (INDEPENDENT_AMBULATORY_CARE_PROVIDER_SITE_OTHER): Payer: Medicare Other | Admitting: *Deleted

## 2012-05-12 ENCOUNTER — Telehealth: Payer: Self-pay | Admitting: *Deleted

## 2012-05-12 DIAGNOSIS — I4891 Unspecified atrial fibrillation: Secondary | ICD-10-CM

## 2012-05-12 LAB — CBC WITH DIFFERENTIAL/PLATELET
Basophils Absolute: 0 10*3/uL (ref 0.0–0.1)
Eosinophils Absolute: 0.1 10*3/uL (ref 0.0–0.7)
Lymphocytes Relative: 22.4 % (ref 12.0–46.0)
MCHC: 34.3 g/dL (ref 30.0–36.0)
Monocytes Relative: 9.3 % (ref 3.0–12.0)
Neutro Abs: 4.4 10*3/uL (ref 1.4–7.7)
Neutrophils Relative %: 65.9 % (ref 43.0–77.0)
Platelets: 290 10*3/uL (ref 150.0–400.0)
RDW: 12.5 % (ref 11.5–14.6)

## 2012-05-12 LAB — BASIC METABOLIC PANEL
Chloride: 96 mEq/L (ref 96–112)
Creatinine, Ser: 0.7 mg/dL (ref 0.4–1.2)
Potassium: 4.7 mEq/L (ref 3.5–5.1)
Sodium: 130 mEq/L — ABNORMAL LOW (ref 135–145)

## 2012-05-12 NOTE — Telephone Encounter (Signed)
Faxed labs to Dr Evlyn Kanner

## 2012-05-12 NOTE — Progress Notes (Signed)
Is having some dizziness from her blood pressure medications changes, she is aware of orthostatic hypotension safety rules  Pt was started on Xarelto 20 mg daily for AF    Reviewed patients medication list.  Pt  currently on any combined P-gp and strong CYP3A4 inhibitors/inducers (ketoconazole, traconazole, ritonavir, carbamazepine, phenytoin, rifampin, St. John's wort).  Reviewed labs.  SCr- 0.7 from 05/12/2012 Weight-  75.29 kg  CrCl- 69.83 Dose appropriate based on CrCl.   Hgb and HCT 13.5 and 39.4 from 05/12/2012  A full discussion of the nature of anticoagulants has been carried out.  A benefit/risk analysis has been presented to the patient, so that they understand the justification for choosing anticoagulation with Xarelto at this time.  The need for compliance is stressed.  Pt is aware to take the medication once daily with the largest meal of the day.  Side effects of potential bleeding are discussed, including unusual colored urine or stools, coughing up blood or coffee ground emesis, nose bleeds or serious fall or head trauma.  Discussed signs and symptoms of stroke. The patient should avoid any OTC items containing aspirin or ibuprofen.  Avoid alcohol consumption.   Call if any signs of abnormal bleeding.  Discussed financial obligations and resolved any difficulty in obtaining medication.  Next lab test test in 6 months.   Called and informed patient her lab work was wnl for dosing of xarelto. 2:37pm

## 2012-05-12 NOTE — Patient Instructions (Addendum)
Notify MD for any changes in clinical condition

## 2012-06-24 ENCOUNTER — Ambulatory Visit (INDEPENDENT_AMBULATORY_CARE_PROVIDER_SITE_OTHER): Payer: Medicare Other | Admitting: *Deleted

## 2012-06-24 ENCOUNTER — Other Ambulatory Visit: Payer: Self-pay | Admitting: Internal Medicine

## 2012-06-24 ENCOUNTER — Encounter: Payer: Self-pay | Admitting: Internal Medicine

## 2012-06-24 DIAGNOSIS — I4891 Unspecified atrial fibrillation: Secondary | ICD-10-CM

## 2012-06-24 DIAGNOSIS — I442 Atrioventricular block, complete: Secondary | ICD-10-CM

## 2012-06-24 LAB — PACEMAKER DEVICE OBSERVATION
ATRIAL PACING PM: 85
BAMS-0001: 150 {beats}/min
RV LEAD IMPEDENCE PM: 496 Ohm
RV LEAD THRESHOLD: 1.25 V

## 2012-06-24 NOTE — Progress Notes (Signed)
PPM check 

## 2012-07-05 ENCOUNTER — Encounter: Payer: Self-pay | Admitting: *Deleted

## 2012-08-10 ENCOUNTER — Observation Stay (HOSPITAL_COMMUNITY)
Admission: EM | Admit: 2012-08-10 | Discharge: 2012-08-11 | Disposition: A | Discharge status: Home / Self Care | Payer: Medicare Other | Attending: Internal Medicine | Admitting: Internal Medicine

## 2012-08-10 ENCOUNTER — Emergency Department (HOSPITAL_COMMUNITY): Payer: Medicare Other

## 2012-08-10 DIAGNOSIS — I499 Cardiac arrhythmia, unspecified: Secondary | ICD-10-CM

## 2012-08-10 DIAGNOSIS — Z79899 Other long term (current) drug therapy: Secondary | ICD-10-CM | POA: Insufficient documentation

## 2012-08-10 DIAGNOSIS — I10 Essential (primary) hypertension: Secondary | ICD-10-CM | POA: Insufficient documentation

## 2012-08-10 DIAGNOSIS — I2510 Atherosclerotic heart disease of native coronary artery without angina pectoris: Secondary | ICD-10-CM | POA: Insufficient documentation

## 2012-08-10 DIAGNOSIS — E039 Hypothyroidism, unspecified: Secondary | ICD-10-CM | POA: Insufficient documentation

## 2012-08-10 DIAGNOSIS — Z950 Presence of cardiac pacemaker: Secondary | ICD-10-CM | POA: Insufficient documentation

## 2012-08-10 DIAGNOSIS — R0789 Other chest pain: Secondary | ICD-10-CM | POA: Insufficient documentation

## 2012-08-10 DIAGNOSIS — E871 Hypo-osmolality and hyponatremia: Secondary | ICD-10-CM

## 2012-08-10 DIAGNOSIS — I4891 Unspecified atrial fibrillation: Principal | ICD-10-CM | POA: Insufficient documentation

## 2012-08-10 DIAGNOSIS — I442 Atrioventricular block, complete: Secondary | ICD-10-CM

## 2012-08-10 DIAGNOSIS — R002 Palpitations: Secondary | ICD-10-CM | POA: Insufficient documentation

## 2012-08-10 LAB — BASIC METABOLIC PANEL
BUN: 9 mg/dL (ref 6–23)
Chloride: 91 mEq/L — ABNORMAL LOW (ref 96–112)
Creatinine, Ser: 0.62 mg/dL (ref 0.50–1.10)
GFR calc Af Amer: >90 mL/min (ref >90)

## 2012-08-10 LAB — CBC
HCT: 39.4 % (ref 36.0–46.0)
MCV: 87.4 fL (ref 78.0–100.0)
RDW: 12.4 % (ref 11.5–15.5)
WBC: 7.1 10*3/uL (ref 4.0–10.5)

## 2012-08-10 MED ORDER — SODIUM CHLORIDE 0.9 % IV BOLUS (SEPSIS): 500.0000 mL | Freq: Once | INTRAVENOUS | Status: DC

## 2012-08-10 NOTE — ED Notes (Signed)
[employer] pacemaker check performed by myself, Consulting civil engineer.  Faxed to [employer] and report received from [employer] and given to Dr. Hyacinth Meeker.

## 2012-08-10 NOTE — ED Notes (Signed)
Pt states, "I think my heart is out of sync and quivering. I feel like I am just out of sync. I have an uneasy, unsettled quiver feeling" denies SOB, denies pain in chest. She states, "I kind of feel like I have a little pressure on my chest" Denies dizziness.

## 2012-08-10 NOTE — ED Provider Notes (Signed)
History     CSN: [id number]  Arrival date & time 08/10/12  1959   First MD Initiated Contact with Patient 08/10/12 2112      Chief Complaint  Patient presents with  . Palpitations    (Consider location/radiation/quality/duration/timing/severity/associated sxs/prior treatment) HPI Comments: 79 year old female who has a history of complete heart block into the 5 requiring pacemaker placement who now presents with one day of a mild tremor. 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 She has no chest pain, no shortness of breath, no nausea or vomiting, no fevers or chills, no coughing. This is unusual for her, she feels like her heart is beating a little bit fast.  The symptoms are mild, persistent, nothing seems to make it better or worse.  Patient is a 79 y.o. female presenting with palpitations. The history is provided by the patient.  Palpitations   Past Medical History  Diagnosis Date  . Complete heart block     s/p PPM by Dr Reyes Ivan 2005, generator change by Dr Johney Frame  9/11 for premature ERI  . Hypertension   . Hyperlipidemia   . DJD (degenerative joint disease)   . GERD (gastroesophageal reflux disease)   . CAD (coronary artery disease)     nonobstructive by cath 2005- 50% mid LAD stenosis  . Hypothyroid   . Paroxysmal atrial fibrillation   . Biatrial enlargement     severe    Past Surgical History  Procedure Laterality Date  . Pacemaker insertion  2005, 12/11/09    implanted by Dr Reyes Ivan, generator change 9/11 by Fawn Kirk for premature ERI  . Tonsillectomy    . Colon surgery    . Hemorroidectomy    . Tee without cardioversion  06/11/2011    Procedure: TRANSESOPHAGEAL ECHOCARDIOGRAM (TEE);  Surgeon: Dolores Patty, MD;  Location: [facility] ENDOSCOPY;  Service: Cardiovascular;  Laterality: N/A;  . Cardioversion  06/11/2011    Procedure: CARDIOVERSION;  Surgeon: Dolores Patty,  MD;  Location: [facility] ENDOSCOPY;  Service: Cardiovascular;  Laterality: N/A;    No family history on file.  History  Substance Use Topics  . Smoking status: Never Smoker   . Smokeless tobacco: Not on file  . Alcohol Use: 1.5 oz/week    3 drink(s) per week     Comment: 3/week - mixed drinks    OB History   Grav Para Term Preterm Abortions TAB SAB Ect Mult Living                  Review of Systems  Cardiovascular: Positive for palpitations.  All other systems reviewed and are negative.    Allergies  Review of patient's allergies indicates no known allergies.  Home Medications   Current Outpatient Rx  Name  Route  Sig  Dispense  Refill  . fish oil-omega-3 fatty acids 1000 MG capsule   Oral   Take 2 g by mouth daily.          . Garlic 1000 MG CAPS   Oral   Take 2 capsules by mouth daily.           . Glucosamine HCl 1000 MG TABS   Oral   Take 1 tablet by mouth daily.         Marland Kitchen latanoprost (XALATAN) 0.005 % ophthalmic solution   Both Eyes   Place 1 drop into both eyes at bedtime.           Marland Kitchen  levothyroxine (SYNTHROID, LEVOTHROID) 200 MCG tablet   Oral   Take 200 mcg by mouth daily before breakfast.          . Multiple Vitamins-Minerals (CENTRUM SILVER PO)   Oral   Take 1 tablet by mouth daily.           . nebivolol (BYSTOLIC) 5 MG tablet   Oral   Take 2.5 mg by mouth daily.          . Olmesartan-Amlodipine-HCTZ (TRIBENZOR) 40-5-12.5 MG TABS   Oral   Take 1 tablet by mouth daily.         . Rivaroxaban (XARELTO) 20 MG TABS   Oral   Take 1 tablet (20 mg total) by mouth daily.   30 tablet   6   . rosuvastatin (CRESTOR) 20 MG tablet   Oral   Take 20 mg by mouth daily.           . timolol (BETIMOL) 0.5 % ophthalmic solution   Both Eyes   Place 1 drop into both eyes every morning.            BP 138/62  Pulse 64  Temp(Src) 98.1 F (36.7 C) (Oral)  Resp 15  SpO2 98%  Physical Exam  Nursing note and vitals  reviewed. Constitutional: She appears well-developed and well-nourished. No distress.  HENT:  Head: Normocephalic and atraumatic.  Mouth/Throat: Oropharynx is clear and moist. No oropharyngeal exudate.  Eyes: Conjunctivae and EOM are normal. Pupils are equal, round, and reactive to light. Right eye exhibits no discharge. Left eye exhibits no discharge. No scleral icterus.  Neck: Normal range of motion. Neck supple. No JVD present. No thyromegaly present.  Cardiovascular: Normal rate, regular rhythm, normal heart sounds and intact distal pulses.  Exam reveals no gallop and no friction rub.   No murmur heard. Pulmonary/Chest: Effort normal and breath sounds normal. No respiratory distress. She has no wheezes. She has no rales.  Abdominal: Soft. Bowel sounds are normal. She exhibits no distension and no mass. There is no tenderness.  Musculoskeletal: Normal range of motion. She exhibits no edema and no tenderness.  Lymphadenopathy:    She has no cervical adenopathy.  Neurological: She is alert. Coordination normal.  No tremor, no limb ataxia, normal speech, cranial nerves III through XII intact, normal strength and sensation of all 4 extremities.  Skin: Skin is warm and dry. No rash noted. No erythema.  Psychiatric: She has a normal mood and affect. Her behavior is normal.    ED Course  Procedures (including critical care time)  Labs Reviewed  CBC - Abnormal; Notable for the following:    MCHC 36.3 (*)    All other components within normal limits  BASIC METABOLIC PANEL - Abnormal; Notable for the following:    Sodium 129 (*)    Chloride 91 (*)    Glucose, Bld 114 (*)    GFR calc non Af Amer 80 (*)    All other components within normal limits  POCT I-STAT TROPONIN I   Dg Chest 2 View  08/10/2012   *RADIOLOGY REPORT*  Clinical Data: Heart palpitations, weakness, shortness of breath, hypertension, pacemaker  CHEST - 2 VIEW  Comparison: 12/11/2009  Findings: Left subclavian transvenous  pacemaker leads project at right atrium and right ventricle. Normal heart size and pulmonary vascularity. Small hiatal hernia. Smoothly marginated rounded density at the left lung base appears to represent a probable posterior left diaphragmatic hernia, unchanged. Mitral annular calcification noted. Right upper lobe  scarring stable. No definite acute infiltrate, pleural effusion or pneumothorax. Bones diffusely demineralized with thoracolumbar scoliosis.  IMPRESSION: No acute abnormalities. Small hiatal hernia questioned at posterior left diaphragmatic hernia. Right upper lobe scarring.   Original Report Authenticated By: Ulyses Southward, M.D.     1. Arrhythmia   2. Hyponatremia       MDM  EKG shows paced rhythm, pulse of 84, no change from prior. Will interrogate pacemaker, last battery change was 2011. Labs show hyponatremia which is a chronic finding, no abnormal blood counts, normal troponin, chest x-ray results reviewed with the patient.  ED ECG REPORT  I personally interpreted this EKG   Date: 08/11/2012   Rate: 84  Rhythm: Atrial sensed ventricular paced rhythm  QRS Axis: left  Intervals: normal  ST/T Wave abnormalities: nonspecific T wave changes  Conduction Disutrbances:Paced rhythm  Narrative Interpretation:   Old EKG Reviewed: Unchanged compared with 06/07/2028   Discussed with [employer] rep who states that the pacemaker has had N. detected multiple tachycardic episodes since it was last interrogated. Discussed with cardiology who wants to observe the patient overnight. She appears stable at this time.      Vida Roller, MD 08/11/12 Marlyne Beards

## 2012-08-11 DIAGNOSIS — I4891 Unspecified atrial fibrillation: Secondary | ICD-10-CM

## 2012-08-11 DIAGNOSIS — I499 Cardiac arrhythmia, unspecified: Secondary | ICD-10-CM

## 2012-08-11 DIAGNOSIS — R002 Palpitations: Secondary | ICD-10-CM

## 2012-08-11 LAB — BASIC METABOLIC PANEL
BUN: 8 mg/dL (ref 6–23)
Calcium: 9.7 mg/dL (ref 8.4–10.5)
GFR calc Af Amer: >90 mL/min (ref >90)
GFR calc non Af Amer: 85 mL/min — ABNORMAL LOW (ref >90)
Glucose, Bld: 105 mg/dL — ABNORMAL HIGH (ref 70–99)
Potassium: 3.6 mEq/L (ref 3.5–5.1)
Sodium: 129 mEq/L — ABNORMAL LOW (ref 135–145)

## 2012-08-11 LAB — CBC
HCT: 36 % (ref 36.0–46.0)
Hemoglobin: 12.8 g/dL (ref 12.0–15.0)
MCH: 31.1 pg (ref 26.0–34.0)
MCHC: 35.6 g/dL (ref 30.0–36.0)
MCV: 87.6 fL (ref 78.0–100.0)
Platelets: 266 10*3/uL (ref 150–400)
RBC: 4.11 MIL/uL (ref 3.87–5.11)
RDW: 12.5 % (ref 11.5–15.5)
WBC: 7 10*3/uL (ref 4.0–10.5)

## 2012-08-11 LAB — TROPONIN I: Troponin I: <0.3 ng/mL (ref <0.30)

## 2012-08-11 MED ORDER — NEBIVOLOL HCL 2.5 MG PO TABS
2.5000 mg | ORAL_TABLET | Freq: Every day | ORAL | Status: DC
  Filled 2012-08-11: qty 1

## 2012-08-11 MED ORDER — OLMESARTAN-AMLODIPINE-HCTZ 40-5-12.5 MG PO TABS: 1.0000 | ORAL_TABLET | Freq: Every day | ORAL | Status: DC

## 2012-08-11 MED ORDER — LATANOPROST 0.005 % OP SOLN
1.0000 [drp] | Freq: Every day | OPHTHALMIC | Status: DC
  Filled 2012-08-11: qty 2.5

## 2012-08-11 MED ORDER — LEVOTHYROXINE SODIUM 200 MCG PO TABS
200.0000 ug | ORAL_TABLET | Freq: Every day | ORAL | Status: DC
  Filled 2012-08-11 (×2): qty 1

## 2012-08-11 MED ORDER — ATORVASTATIN CALCIUM 80 MG PO TABS
80.0000 mg | ORAL_TABLET | Freq: Every day | ORAL | Status: DC
  Filled 2012-08-11: qty 1

## 2012-08-11 MED ORDER — ONDANSETRON HCL 4 MG/2ML IJ SOLN: 4.0000 mg | Freq: Four times a day (QID) | INTRAMUSCULAR | Status: DC | PRN

## 2012-08-11 MED ORDER — ACETAMINOPHEN 325 MG PO TABS: 650.0000 mg | ORAL_TABLET | ORAL | Status: DC | PRN

## 2012-08-11 MED ORDER — AMLODIPINE BESYLATE 5 MG PO TABS
5.0000 mg | ORAL_TABLET | Freq: Every day | ORAL | Status: DC
  Filled 2012-08-11: qty 1

## 2012-08-11 MED ORDER — IRBESARTAN 300 MG PO TABS
300.0000 mg | ORAL_TABLET | Freq: Every day | ORAL | Status: DC
  Filled 2012-08-11: qty 1

## 2012-08-11 MED ORDER — RIVAROXABAN 20 MG PO TABS
20.0000 mg | ORAL_TABLET | Freq: Every day | ORAL | Status: DC
  Filled 2012-08-11: qty 1

## 2012-08-11 MED ORDER — NITROGLYCERIN 0.4 MG SL SUBL: 0.4000 mg | SUBLINGUAL_TABLET | SUBLINGUAL | Status: DC | PRN

## 2012-08-11 MED ORDER — HYDROCHLOROTHIAZIDE 12.5 MG PO CAPS
12.5000 mg | ORAL_CAPSULE | Freq: Every day | ORAL | Status: DC
  Filled 2012-08-11: qty 1

## 2012-08-11 MED ORDER — TIMOLOL HEMIHYDRATE 0.5 % OP SOLN: 1.0000 [drp] | Freq: Every morning | OPHTHALMIC | Status: DC

## 2012-08-11 MED ORDER — TIMOLOL MALEATE 0.5 % OP SOLN
1.0000 [drp] | Freq: Every day | OPHTHALMIC | Status: DC
  Filled 2012-08-11: qty 5

## 2012-08-11 NOTE — Discharge Summary (Signed)
Physician Discharge Summary      Patient ID: Beth Santiago MRN: [id number] DOB/AGE: Oct 28, 1924 79 y.o.  Admit date: 08/10/2012 Discharge date: 08/11/2012  Primary Discharge Diagnosis:  Paroxysmal atrial fibrillation, palpitations Secondary Discharge Diagnosis:  Complete heart block, chronic hyponatremia, hypertension  Significant Diagnostic Studies: pacemaker interrogation  Consults: none  Hospital Course: The patient presented to [facility] for evaluation of palpitations.  She was observed overnight without event.  Her telemetry revealed AV sequential pacing without arrhythmias.  Pacemaker interrogation was reviewed and revealed mode switches suggestive of short episodes of afib (lasting < 30 seconds).  Cardiac markers remained negative.  At time of discharge, the patient was without complaint.   Discharge Exam: Blood pressure 120/73, pulse 76, temperature 97.7 F (36.5 C), temperature source Oral, resp. rate 18, height 5' 7.5" (1.715 m), weight 165 lb 5.5 oz (75 kg), SpO2 98.00%.    Physical Exam: Filed Vitals:   08/11/12 0000 08/11/12 0030 08/11/12 0209 08/11/12 0513  BP: 129/66  127/77 120/73  Pulse: 59 65 65 76  Temp:   97.3 F (36.3 C) 97.7 F (36.5 C)  TempSrc:   Oral Oral  Resp: 16 15 18 18   Height:   5' 7.5" (1.715 m)   Weight:   165 lb 5.5 oz (75 kg)   SpO2: 99% 99% 99% 98%    GEN- The patient is well appearing, alert and oriented x 3 today.   Head- normocephalic, atraumatic Eyes-  Sclera clear, conjunctiva pink Ears- hearing intact Oropharynx- clear Neck- supple, no JVP Lymph- no cervical lymphadenopathy Lungs- Clear to ausculation bilaterally, normal work of breathing Heart- Regular rate and rhythm, no murmurs, rubs or gallops, PMI not laterally displaced GI- soft, NT, ND, + BS Extremities- no clubbing, cyanosis, or edema Neuro- strength and sensation are intact At time of discharge, the patient was alert, chatty, and ambulatory without  complaint  Labs:   Lab Results  Component Value Date   WBC 7.0 08/11/2012   HGB 12.8 08/11/2012   HCT 36.0 08/11/2012   MCV 87.6 08/11/2012   PLT 266 08/11/2012    Recent Labs Lab 08/11/12 0218  NA 129*  K 3.6  CL 92*  CO2 26  BUN 8  CREATININE 0.52  CALCIUM 9.7  GLUCOSE 105*   Lab Results  Component Value Date   TROPONINI <0.30 08/11/2012     Radiology:  No acute airspace disease on cxr  EKG: AV sequential pacing  FOLLOW UP PLANS AND APPOINTMENTS  Future Appointments Provider Department Dept Phone   11/09/2012 11:00 AM Justin Mend [place], [facility] [phone]       Medication List    TAKE these medications       CENTRUM SILVER PO  Take 1 tablet by mouth daily.     fish oil-omega-3 fatty acids 1000 MG capsule  Take 2 g by mouth daily.     Garlic 1000 MG Caps  Take 2 capsules by mouth daily.     Glucosamine HCl 1000 MG Tabs  Take 1 tablet by mouth daily.     latanoprost 0.005 % ophthalmic solution  Commonly known as:  XALATAN  Place 1 drop into both eyes at bedtime.     levothyroxine 200 MCG tablet  Commonly known as:  SYNTHROID, LEVOTHROID  Take 200 mcg by mouth daily before breakfast.     nebivolol 5 MG tablet  Commonly known as:  BYSTOLIC  Take 2.5 mg by mouth daily.     Rivaroxaban  20 MG Tabs  Commonly known as:  XARELTO  Take 1 tablet (20 mg total) by mouth daily.     rosuvastatin 20 MG tablet  Commonly known as:  CRESTOR  Take 20 mg by mouth daily.     timolol 0.5 % ophthalmic solution  Commonly known as:  BETIMOL  Place 1 drop into both eyes every morning.     TRIBENZOR 40-5-12.5 MG Tabs  Generic drug:  Olmesartan-Amlodipine-HCTZ  Take 1 tablet by mouth daily.           Follow-up Information   Follow up with [name], MD. (as scheduled)    Contact information:   [address], [address] [place] [address] [phone]       BRING ALL MEDICATIONS WITH YOU TO FOLLOW UP APPOINTMENTS  Time  spent with patient to include physician time: 20 minutes Signed: [name], MD 08/11/2012, 7:52 AM

## 2012-08-11 NOTE — H&P (Signed)
Physician History and Physical    Zenna Traister MRN: [id number] DOB/AGE: 77-Mar-1926 77 y.o. Admit date: 08/10/2012  Primary Cardiologist:  Dr. Johney Frame and Dr. Gala Romney  CC:  Palpitations, chest pressure  HPI:  Pt is a 79 yo woman with CHB s/p PPM, HTN, HLD, CAD, pAF who presents with heart quivering and chest pressure for the past 3 days.  She called her PCP and he directed her to come to the ED.  Her sx have been stable over the past 3 days.  She reports that for the past few days she has noticed that her heart is "quivering" and that is the best way that she can describe it.  She has also noted some occasional chest pressure that she describes as "barely there."  She denies any LE edema, PND, orthopnea, syncope.  She had a TEE/DCCV last year for AF, but she was unable to recall what her sx were like at that time.  She denies any current chest pain.  She has no other complaints.  She did take her xarelto and crestor tonight before coming to the hospital.  Review of systems: A review of 10 organ systems was done and is negative except as stated above in HPI  Past Medical History  Diagnosis Date  . Complete heart block     s/p PPM by Dr Reyes Ivan 2005, generator change by Dr Johney Frame  9/11 for premature ERI  . Hypertension   . Hyperlipidemia   . DJD (degenerative joint disease)   . GERD (gastroesophageal reflux disease)   . CAD (coronary artery disease)     nonobstructive by cath 2005- 50% mid LAD stenosis  . Hypothyroid   . Paroxysmal atrial fibrillation   . Biatrial enlargement     severe   Past Surgical History  Procedure Laterality Date  . Pacemaker insertion  2005, 12/11/09    implanted by Dr Reyes Ivan, generator change 9/11 by Fawn Kirk for premature ERI  . Tonsillectomy    . Colon surgery    . Hemorroidectomy    . Tee without cardioversion  06/11/2011    Procedure: TRANSESOPHAGEAL ECHOCARDIOGRAM (TEE);  Surgeon: Dolores Patty, MD;  Location: [facility] ENDOSCOPY;  Service:  Cardiovascular;  Laterality: N/A;  . Cardioversion  06/11/2011    Procedure: CARDIOVERSION;  Surgeon: Dolores Patty, MD;  Location: [facility] ENDOSCOPY;  Service: Cardiovascular;  Laterality: N/A;   History   Social History  . Marital Status: Widowed    Spouse Name: N/A    Number of Children: N/A  . Years of Education: N/A   Occupational History  . Not on file.   Social History Main Topics  . Smoking status: Never Smoker   . Smokeless tobacco: Not on file  . Alcohol Use: 1.5 oz/week    3 drink(s) per week     Comment: 3/week - mixed drinks  . Drug Use: No  . Sexually Active: Not on file   Other Topics Concern  . Not on file   Social History Narrative  . No narrative on file    No family history on file.   No Known Allergies   (Not in a hospital admission)  Current facility-administered medications:sodium chloride 0.9 % bolus 500 mL, 500 mL, Intravenous, Once, Vida Roller, MD Current outpatient prescriptions:fish oil-omega-3 fatty acids 1000 MG capsule, Take 2 g by mouth daily. , Disp: , Rfl: ;  Garlic 1000 MG CAPS, Take 2 capsules by mouth daily.  , Disp: , Rfl: ;  Glucosamine HCl 1000 MG TABS, Take 1 tablet by mouth daily., Disp: , Rfl: ;  latanoprost (XALATAN) 0.005 % ophthalmic solution, Place 1 drop into both eyes at bedtime.  , Disp: , Rfl:  levothyroxine (SYNTHROID, LEVOTHROID) 200 MCG tablet, Take 200 mcg by mouth daily before breakfast. , Disp: , Rfl: ;  Multiple Vitamins-Minerals (CENTRUM SILVER PO), Take 1 tablet by mouth daily.  , Disp: , Rfl: ;  nebivolol (BYSTOLIC) 5 MG tablet, Take 2.5 mg by mouth daily. , Disp: , Rfl: ;  Olmesartan-Amlodipine-HCTZ (TRIBENZOR) 40-5-12.5 MG TABS, Take 1 tablet by mouth daily., Disp: , Rfl:  Rivaroxaban (XARELTO) 20 MG TABS, Take 1 tablet (20 mg total) by mouth daily., Disp: 30 tablet, Rfl: 6;  rosuvastatin (CRESTOR) 20 MG tablet, Take 20 mg by mouth daily.  , Disp: , Rfl: ;  timolol (BETIMOL) 0.5 % ophthalmic solution, Place 1  drop into both eyes every morning. , Disp: , Rfl: ;  [DISCONTINUED] amLODipine (NORVASC) 5 MG tablet, Take 5 mg by mouth daily.  , Disp: , Rfl:  [DISCONTINUED] hydrochlorothiazide (MICROZIDE) 12.5 MG capsule, Take 12.5 mg by mouth daily.  , Disp: , Rfl: ;  [DISCONTINUED] olmesartan (BENICAR) 40 MG tablet, Take 40 mg by mouth daily.  , Disp: , Rfl:   Physical Exam: Blood pressure 138/62, pulse 64, temperature 98.1 F (36.7 C), temperature source Oral, resp. rate 15, SpO2 98.00%.; There is no weight on file to calculate BMI. Temp:  [97.9 F (36.6 C)-98.1 F (36.7 C)] 98.1 F (36.7 C) (05/21 2233) Pulse Rate:  [64-87] 64 (05/21 2233) Resp:  [15-16] 15 (05/21 2233) BP: (138-154)/(62-96) 138/62 mmHg (05/21 2233) SpO2:  [97 %-98 %] 98 % (05/21 2233)  No intake or output data in the 24 hours ending 08/11/12 0012 General: NAD Heent: MMM Neck: No JVD  CV:  RRR, nl S1/S2, no S3/S4, no murmur.  Lungs: Clear to auscultation bilaterally ant with normal respiratory effort Abdomen: Soft, nontender, nondistended Extremities: No clubbing or cyanosis. No pedal edema Skin: Intact without lesions or rashes  Neurologic: Alert and oriented x 3, grossly nonfocal  Psych: Normal mood and affect    Labs: No results found for this basename: CKTOTAL, CKMB, TROPONINI,  in the last 72 hours Lab Results  Component Value Date   WBC 7.1 08/10/2012   HGB 14.3 08/10/2012   HCT 39.4 08/10/2012   MCV 87.4 08/10/2012   PLT 281 08/10/2012    Recent Labs Lab 08/10/12 2015  NA 129*  K 4.8  CL 91*  CO2 27  BUN 9  CREATININE 0.62  CALCIUM 10.3  GLUCOSE 114*      EKG:  AV paced  [employer] device interrogation performed by device rep 12 mode switches, all <71min  Radiology:  Dg Chest 2 View  08/10/2012   *RADIOLOGY REPORT*  Clinical Data: Heart palpitations, weakness, shortness of breath, hypertension, pacemaker  CHEST - 2 VIEW  Comparison: 12/11/2009  Findings: Left subclavian transvenous pacemaker leads  project at right atrium and right ventricle. Normal heart size and pulmonary vascularity. Small hiatal hernia. Smoothly marginated rounded density at the left lung base appears to represent a probable posterior left diaphragmatic hernia, unchanged. Mitral annular calcification noted. Right upper lobe scarring stable. No definite acute infiltrate, pleural effusion or pneumothorax. Bones diffusely demineralized with thoracolumbar scoliosis.  IMPRESSION: No acute abnormalities. Small hiatal hernia questioned at posterior left diaphragmatic hernia. Right upper lobe scarring.   Original Report Authenticated By: Ulyses Southward, M.D.    ASSESSMENT:  Pt is a 79 yo woman with CHB s/p PPM, HTN, HLD, CAD, pAF who presents with heart quivering and chest pressure for the past 3 days.  PLAN: Heart "quivering" - pt with 12 mode switch episodes on device interrogation, lasting <1 min.  On device interrogation last month, she also had AMS episodes.  It is unclear if her sx are related to her parox AF, as she was also having this last month and did not report sx.  She is already on BB.  Can consider amio or other antiarrhythmic if pt remains symptomatic and it is thought to be from AF.  Continue xarelto.  Monitor on tele. Will discuss device interrogation with Dr. Johney Frame in am.  Check TSH  Chest pressure - pt with known nonobstructive CAD by last cath.  EKG paced.  First trop neg, will continue to trend.  She is currently CP free.  Consider stress testing in am.    HTN/HLD - continue home meds  Hyponatremia - chronic, appears stable.  Recheck BMP in am  Hypothyroid - continue synthroid, check TSH  Ppx - on xarelto  Dispo -  Admit to [facility], NPO for possible stress test in am  Signed: Hilary Hertz, MD Cardiology Fellow 08/11/2012, 12:12 AM

## 2012-11-09 ENCOUNTER — Ambulatory Visit (INDEPENDENT_AMBULATORY_CARE_PROVIDER_SITE_OTHER): Payer: Medicare Other | Admitting: *Deleted

## 2012-11-09 ENCOUNTER — Ambulatory Visit: Payer: Medicare Other | Admitting: Pharmacist

## 2012-11-09 DIAGNOSIS — Z7901 Long term (current) use of anticoagulants: Secondary | ICD-10-CM

## 2012-11-09 DIAGNOSIS — I4891 Unspecified atrial fibrillation: Secondary | ICD-10-CM

## 2012-11-09 LAB — CBC WITH DIFFERENTIAL/PLATELET
Hemoglobin: 13.3 g/dL (ref 12.0–15.0)
Lymphocytes Relative: 17.4 % (ref 12.0–46.0)
Lymphs Abs: 1.2 10*3/uL (ref 0.7–4.0)
MCHC: 34.6 g/dL (ref 30.0–36.0)
Monocytes Absolute: 0.6 10*3/uL (ref 0.1–1.0)
Monocytes Relative: 9 % (ref 3.0–12.0)
Neutro Abs: 5.1 10*3/uL (ref 1.4–7.7)
Neutrophils Relative %: 71.3 % (ref 43.0–77.0)
Platelets: 288 10*3/uL (ref 150.0–400.0)
RDW: 13 % (ref 11.5–14.6)

## 2012-11-09 LAB — BASIC METABOLIC PANEL
BUN: 11 mg/dL (ref 6–23)
Calcium: 9.4 mg/dL (ref 8.4–10.5)
GFR: 97.03 mL/min (ref >60.00)
Glucose, Bld: 98 mg/dL (ref 70–99)

## 2012-11-09 MED ORDER — RIVAROXABAN 20 MG PO TABS: 20.0000 mg | ORAL_TABLET | Freq: Every day | ORAL | Status: DC

## 2012-11-09 NOTE — Patient Instructions (Signed)
Return office visit 05/07/2013 11am

## 2012-11-09 NOTE — Progress Notes (Signed)
Pt was started on Xarelto for AF since 05/2011  Patient states no side effects with this medication  Reviewed patients medication list. Pt currently not on any combined P-gp and strong CYP3A4 inhibitors/inducers (ketoconazole, traconazole, ritonavir, carbamazepine, phenytoin, rifampin, St. John's wort). Reviewed labs. SCr 0.6, Weight 75.45, CrCl- 81.65  Dose is appropriate based on CrCl. Hgb 13.3 and HCT 38.4  A full discussion of the nature of anticoagulants has been carried out.  A benefit/risk analysis has been presented to the patient, so that they understand the justification for choosing anticoagulation with Xarelto at this time.  The need for compliance is stressed.  Pt is aware to take the medication once daily with the largest meal of the day.  Side effects of potential bleeding are discussed, including unusual colored urine or stools, coughing up blood or coffee ground emesis, nose bleeds or serious fall or head trauma.  Discussed signs and symptoms of stroke. The patient should avoid any OTC items containing aspirin or ibuprofen.  Avoid alcohol consumption.   Call if any signs of abnormal bleeding.  Discussed financial obligations and resolved any difficulty in obtaining medication.  Next lab test test in 6 months, made for 04/2013.

## 2012-12-02 ENCOUNTER — Telehealth: Payer: Self-pay | Admitting: Internal Medicine

## 2012-12-02 NOTE — Telephone Encounter (Signed)
Something is not right, feels like my pacer is not working right.  Weakness x 2 weeks and quiver in my body.  This has happen before when my pacer was not working right.    If you can't reach me please call my daughter phone # [phone].  We are together.

## 2012-12-02 NOTE — Telephone Encounter (Signed)
Saw Beth Santiago at Dr [employer] office today because she is not feeling. Her VS were all good today.  They did an EKG and they were going to send to use.  She felt like she had no energy, but feels better now.  This has been going off and on for 2 weeks.  Feels an all over "quiver".  I let her know I would get the EKG and call her back after it is reviewed  [phone]

## 2012-12-05 NOTE — Telephone Encounter (Signed)
New Problem  no energy// cant get a deep breath// quivering feeling// believes that it is due to afib/// request a call back to discuss.

## 2012-12-06 ENCOUNTER — Encounter: Payer: Medicare Other | Admitting: Cardiology

## 2012-12-06 NOTE — Telephone Encounter (Signed)
Sent over an EKG, Dr Johney Frame reviewed.  Will bring her in on Fri with Sharrell Ku to have her device interrogated. I offered today to the patient but Friday suits her schedule better

## 2012-12-09 ENCOUNTER — Telehealth: Payer: Self-pay | Admitting: *Deleted

## 2012-12-09 ENCOUNTER — Encounter (HOSPITAL_COMMUNITY): Payer: Self-pay

## 2012-12-09 ENCOUNTER — Encounter (INDEPENDENT_AMBULATORY_CARE_PROVIDER_SITE_OTHER): Payer: Self-pay | Admitting: Cardiology

## 2012-12-09 ENCOUNTER — Emergency Department (HOSPITAL_COMMUNITY): Payer: Medicare Other

## 2012-12-09 ENCOUNTER — Emergency Department (HOSPITAL_COMMUNITY)
Admission: EM | Admit: 2012-12-09 | Discharge: 2012-12-09 | Disposition: A | Discharge status: Home / Self Care | Payer: Medicare Other | Attending: Emergency Medicine | Admitting: Emergency Medicine

## 2012-12-09 ENCOUNTER — Telehealth: Payer: Self-pay | Admitting: Physician Assistant

## 2012-12-09 ENCOUNTER — Encounter: Payer: Self-pay | Admitting: Cardiology

## 2012-12-09 ENCOUNTER — Other Ambulatory Visit: Payer: Self-pay | Admitting: Physician Assistant

## 2012-12-09 DIAGNOSIS — R5383 Other fatigue: Secondary | ICD-10-CM

## 2012-12-09 DIAGNOSIS — R072 Precordial pain: Secondary | ICD-10-CM

## 2012-12-09 DIAGNOSIS — I10 Essential (primary) hypertension: Secondary | ICD-10-CM | POA: Insufficient documentation

## 2012-12-09 DIAGNOSIS — E785 Hyperlipidemia, unspecified: Secondary | ICD-10-CM | POA: Insufficient documentation

## 2012-12-09 DIAGNOSIS — I4891 Unspecified atrial fibrillation: Secondary | ICD-10-CM | POA: Insufficient documentation

## 2012-12-09 DIAGNOSIS — R5381 Other malaise: Secondary | ICD-10-CM | POA: Insufficient documentation

## 2012-12-09 DIAGNOSIS — E039 Hypothyroidism, unspecified: Secondary | ICD-10-CM | POA: Insufficient documentation

## 2012-12-09 DIAGNOSIS — R079 Chest pain, unspecified: Secondary | ICD-10-CM

## 2012-12-09 DIAGNOSIS — R0989 Other specified symptoms and signs involving the circulatory and respiratory systems: Secondary | ICD-10-CM

## 2012-12-09 DIAGNOSIS — R0602 Shortness of breath: Secondary | ICD-10-CM | POA: Insufficient documentation

## 2012-12-09 DIAGNOSIS — Z79899 Other long term (current) drug therapy: Secondary | ICD-10-CM | POA: Insufficient documentation

## 2012-12-09 DIAGNOSIS — Z8739 Personal history of other diseases of the musculoskeletal system and connective tissue: Secondary | ICD-10-CM | POA: Insufficient documentation

## 2012-12-09 DIAGNOSIS — I2510 Atherosclerotic heart disease of native coronary artery without angina pectoris: Secondary | ICD-10-CM | POA: Insufficient documentation

## 2012-12-09 DIAGNOSIS — Z8719 Personal history of other diseases of the digestive system: Secondary | ICD-10-CM | POA: Insufficient documentation

## 2012-12-09 HISTORY — DX: Disorder of arteries and arterioles, unspecified: I77.9

## 2012-12-09 HISTORY — DX: Peripheral vascular disease, unspecified: I73.9

## 2012-12-09 HISTORY — DX: Atrioventricular block, second degree: I44.1

## 2012-12-09 HISTORY — DX: Unspecified atrial flutter: I48.92

## 2012-12-09 LAB — COMPREHENSIVE METABOLIC PANEL
ALT: 36 U/L — ABNORMAL HIGH (ref 0–35)
Albumin: 3.8 g/dL (ref 3.5–5.2)
Alkaline Phosphatase: 66 U/L (ref 39–117)
BUN: 8 mg/dL (ref 6–23)
Potassium: 4 mEq/L (ref 3.5–5.1)
Sodium: 131 mEq/L — ABNORMAL LOW (ref 135–145)
Total Protein: 6.6 g/dL (ref 6.0–8.3)

## 2012-12-09 LAB — URINALYSIS, ROUTINE W REFLEX MICROSCOPIC
Glucose, UA: NEGATIVE mg/dL
Ketones, ur: NEGATIVE mg/dL
pH: 7 (ref 5.0–8.0)

## 2012-12-09 LAB — CBC
MCHC: 35.7 g/dL (ref 30.0–36.0)
RDW: 13.2 % (ref 11.5–15.5)

## 2012-12-09 LAB — URINE MICROSCOPIC-ADD ON

## 2012-12-09 LAB — POCT I-STAT TROPONIN I

## 2012-12-09 MED ORDER — FUROSEMIDE 20 MG PO TABS: 20.0000 mg | ORAL_TABLET | ORAL | Status: DC

## 2012-12-09 MED ORDER — ASPIRIN 81 MG PO CHEW
243.0000 mg | CHEWABLE_TABLET | Freq: Once | ORAL | Status: AC
  Administered 2012-12-09: 243 mg via ORAL
  Filled 2012-12-09: qty 3

## 2012-12-09 MED ORDER — POTASSIUM CHLORIDE CRYS ER 20 MEQ PO TBCR: 20.0000 meq | EXTENDED_RELEASE_TABLET | ORAL | Status: DC

## 2012-12-09 NOTE — Consult Note (Signed)
Cardiology Consultation  Patient ID: Beth Santiago MRN: [id number], DOB: [birth year] Date of Encounter: 12/09/2012, 4:31 PM Primary Physician: [name], MD Primary Cardiologist: Beth Santiago Also previously saw Beth Santiago in CHF clinic  Chief Complaint: left arm pain, CP, "no get-up-and-go"  HPI: Beth Santiago is an 79 y/o F with history of PAF dx 05/2011 by pacer interrogation (s/p TEE/DCCV 05/2011 & severe bi-atrial enlargement with recurrence 07/2012), nonobstructive CAD 2007 with normal nuc 05/2011, hypothyroidism, heart block s/p PPM who presented to [facility] today with L arm pain, CP, SOB, and decreased energy.  She was seen by her PCP a few days ago for eval of fatigue and was found to have a low sodium level thus Tribenzor was stopped - Na has been 128-132 for at least 2 yrs. She has had intermittent L arm pain all week. Last night it was constant for several hours. It was not worse with movement or palpation. When she woke up this morning she felt a sensation of chest pressure "like a weight." It stayed very low grade all morning and she tried to ignore it, but it persisted. It did not worsen or improve with anything. She also has felt no "get-up-and-go" for approximately 2 weeks. She has occasionally felt a "quivering" in her chest. Endorses med compliance. No diaphoresis, nausea, dizziness, syncope. She had an appointment with Beth Santiago scheduled for noon, but called the answering service this AM due to the L arm pain/chest pressure. She was suggested to go to ER but she elected to present to the office at 9am to see if she could be seen. Due to sx, she was sent to the ED. ECG V-paced. L arm pain is gone but the low grade chest pressure remains. Sx remind her of her admission in 07/2012 when she had recurrent AF demonstrated on pacer eval. She is very active and does her own cooking, cleaning, and walks up steps without any significant CP or SOB. She did go to [employer] last week and  when she got home felt "totally wiped out." CXR nonacute, contaminated UA, neg troponin x 2 despite prolonged pain, mildly anemic with Hgb 11.9, ECG V-paced. Pacer interrogation shows episode of AF in July, 11/17/12 and recurrent episode today for over 4 hours.  Past Medical History  Diagnosis Date  . Second degree heart block     a. s/p PPM 2005. b. Gen change to Medtronic by Dr Johney Santiago  9/11 for premature ERI 11/2009.  Marland Kitchen Hypertension   . Hyperlipidemia   . DJD (degenerative joint disease)   . GERD (gastroesophageal reflux disease)   . CAD (coronary artery disease)     a. Nonobstructive by cath 2005 - 40-50% mid LAD. b. Nonischemic nuc 05/2011.  Marland Kitchen Hypothyroid   . Paroxysmal atrial fibrillation     a. Dx by device interrogation 05/2011. b. s/p TEE-DCCV to NSR 05/2011, but felt high risk for recurrent AF given bi-atrial enlargement. c. Recurrence of AF 07/2012 on interrogation (during adm for CP/palp).  . Biatrial enlargement     severe  . Carotid artery disease     a. 1-50% bilateral (upper end) - March 2011.  . Atrial flutter     a. Noted during pacemaker implantation 2011, spontaneously terminated.     Most Recent Cardiac Studies: Nuclear Stress Test 05/2011 Impression  Exercise Capacity: Lexiscan with no exercise.  BP Response: Normal blood pressure response.  Clinical Symptoms: No chest pain.  ECG Impression: Baseline: LBBB. EKG uninterpretable due to LBBB at  rest and stress.  Comparison with Prior Nuclear Study: No significant change from previous study  Overall Impression: Normal stress nuclear study.  LV Ejection Fraction: 79%. LV Wall Motion: NL LV Function; NL Wall Motion  TEE 05/2011 - Left ventricle: The cavity size was normal. Wall thickness was normal. Systolic function was normal. The estimated ejection fraction was 55%. - Aortic valve: No evidence of vegetation. - Mitral valve: No evidence of vegetation. Moderate regurgitation. - Left atrium: The atrium was moderately  to severely dilated. No evidence of thrombus in the atrial cavity or appendage. - Right atrium: The atrium was moderately to severely dilated. - Tricuspid valve: No evidence of vegetation. - Pulmonic valve: No evidence of vegetation. - Pericardium, extracardiac: A trivial pericardial effusion was identified.   Surgical History:  Past Surgical History  Procedure Laterality Date  . Pacemaker insertion  2005, 12/11/09    implanted by Dr Reyes Ivan, generator change 9/11 by Fawn Kirk for premature ERI  . Tonsillectomy    . Colon surgery    . Hemorroidectomy    . Tee without cardioversion  06/11/2011    Procedure: TRANSESOPHAGEAL ECHOCARDIOGRAM (TEE);  Surgeon: Dolores Patty, MD;  Location: [facility] ENDOSCOPY;  Service: Cardiovascular;  Laterality: N/A;  . Cardioversion  06/11/2011    Procedure: CARDIOVERSION;  Surgeon: Dolores Patty, MD;  Location: [facility] ENDOSCOPY;  Service: Cardiovascular;  Laterality: N/A;  . Abdominal surgery      twisted bowel     Home Meds: Prior to Admission medications   Medication Sig Start Date End Date Taking? Authorizing Provider  amLODipine-olmesartan (AZOR) 5-40 MG per tablet Take 1 tablet by mouth daily.   Yes Historical Provider, MD  aspirin EC 81 MG tablet Take 81 mg by mouth daily.   Yes Historical Provider, MD  fish oil-omega-3 fatty acids 1000 MG capsule Take 2 g by mouth daily.    Yes Historical Provider, MD  Garlic 1000 MG CAPS Take 2 capsules by mouth daily.     Yes Historical Provider, MD  Glucosamine HCl 1000 MG TABS Take 1 tablet by mouth daily.   Yes Historical Provider, MD  latanoprost (XALATAN) 0.005 % ophthalmic solution Place 1 drop into both eyes at bedtime.     Yes Historical Provider, MD  levothyroxine (SYNTHROID, LEVOTHROID) 100 MCG tablet Take 100 mcg by mouth daily before breakfast.   Yes Historical Provider, MD  Multiple Vitamins-Minerals (CENTRUM SILVER PO) Take 1 tablet by mouth daily.     Yes Historical Provider, MD  nebivolol (BYSTOLIC) 5  MG tablet Take 5 mg by mouth daily.    Yes Historical Provider, MD  omeprazole (PRILOSEC) 20 MG capsule Take 20 mg by mouth daily.   Yes Historical Provider, MD  Rivaroxaban (XARELTO) 20 MG TABS tablet Take 1 tablet (20 mg total) by mouth daily with supper. 11/09/12  Yes Beth Maw, MD  rosuvastatin (CRESTOR) 20 MG tablet Take 20 mg by mouth daily.     Yes Historical Provider, MD  timolol (BETIMOL) 0.5 % ophthalmic solution Place 1 drop into both eyes every morning.    Yes Historical Provider, MD    Allergies: No Active Allergies  History   Social History  . Marital Status: Widowed    Spouse Name: N/A    Number of Children: N/A  . Years of Education: N/A   Occupational History  . Not on file.   Social History Main Topics  . Smoking status: Never Smoker   . Smokeless tobacco: Not on file  .  Alcohol Use: 1.5 oz/week    3 drink(s) per week     Comment: 3/week - mixed drinks  . Drug Use: No  . Sexual Activity: Not on file   Other Topics Concern  . Not on file   Social History Narrative  . No narrative on file     Family History: no premature CAD  Review of Systems: General: negative for chills, fever, night sweats or weight changes.  Cardiovascular: see above Dermatological: negative for rash Respiratory: negative for cough or wheezing Urologic: no dysuria, urinary frequency, urgency, or hematuria Abdominal: negative for nausea, vomiting, diarrhea, bright red blood per rectum, melena, or hematemesis Neurologic: negative for visual changes, syncope, or dizziness All other systems reviewed and are otherwise negative except as noted above.  Labs:  Results for TANISE, RUSSMAN (MRN [id number]) as of 12/09/2012 16:17  Ref. Range 12/09/2012 14:01  Color, Urine Latest Range: YELLOW  YELLOW  APPearance Latest Range: CLEAR  CLOUDY (A)  Specific Gravity, Urine Latest Range: 1.005-1.030  1.005  pH Latest Range: 5.0-8.0  7.0  Glucose Latest Range: NEGATIVE mg/dL NEGATIVE    Bilirubin Urine Latest Range: NEGATIVE  NEGATIVE  Ketones, ur Latest Range: NEGATIVE mg/dL NEGATIVE  Protein Latest Range: NEGATIVE mg/dL NEGATIVE  Urobilinogen, UA Latest Range: 0.0-1.0 mg/dL 0.2  Nitrite Latest Range: NEGATIVE  NEGATIVE  Leukocytes, UA Latest Range: NEGATIVE  LARGE (A)  Hgb urine dipstick Latest Range: NEGATIVE  SMALL (A)  WBC, UA Latest Range: <3 WBC/hpf TOO NUMEROUS TO COUNT  RBC / HPF Latest Range: <3 RBC/hpf 3-6  Squamous Epithelial / LPF Latest Range: RARE  MANY (A)  Bacteria, UA Latest Range: RARE  MANY (A)     Lab Results  Component Value Date   WBC 8.2 12/09/2012   HGB 11.9* 12/09/2012   HCT 33.3* 12/09/2012   MCV 90.2 12/09/2012   PLT 224 12/09/2012     Recent Labs Lab 12/09/12 1141  NA 131*  K 4.0  CL 97  CO2 22  BUN 8  CREATININE 0.65  CALCIUM 9.3  PROT 6.6  BILITOT 0.6  ALKPHOS 66  ALT 36*  AST 28  GLUCOSE 107*   Radiology/Studies:  Dg Chest 2 View 12/09/2012   CLINICAL DATA:  Chest pressure and weakness. Heart block and hypertension.  EXAM: CHEST  2 VIEW  COMPARISON:  08/10/2012.  FINDINGS: Sequential pacemaker enters from the left with leads in the region of the right atrium and right ventricle without obvious interruption appearing similar to the prior exam.  Scoliosis lumbar spine convex to left.  Heart size top-normal.  Calcified mildly tortuous aorta.  Chronic lung changes stable with bilateral apical pleural thickening greater on right without associated bony destruction.  Eventration left hemidiaphragm stable.  No segmental infiltrate.  Pulmonary vascular prominence stable.  IMPRESSION: No acute abnormality. Please see above.   Electronically Signed   By: Bridgett Larsson   On: 12/09/2012 12:25    EKG: V paced 80bpm   Physical Exam: Blood pressure 109/72, pulse 68, temperature 97.6 F (36.4 C), temperature source Oral, resp. rate 21, SpO2 98.00%. General: Well developed lively WF in no acute distress. Head: Normocephalic, atraumatic,  sclera non-icteric, no xanthomas, nares are without discharge.  Neck: Negative for carotid bruits. JVD 9-10 Lungs: Clear bilaterally to auscultation without wheezes, rales, or rhonchi. Breathing is unlabored. Heart: RRR with S1 S2. No murmurs, rubs, or gallops appreciated. Abdomen: Soft, non-tender, non-distended with normoactive bowel sounds. No hepatomegaly. No rebound/guarding. No obvious abdominal masses.  Msk:  Strength and tone appear normal for age. Extremities: No clubbing or cyanosis. No edema.  Distal pedal pulses are 2+ and equal bilaterally. Neuro: Alert and oriented X 3. No focal deficit. No facial asymmetry. Moves all extremities spontaneously. Psych:  Responds to questions appropriately with a normal affect.    ASSESSMENT AND PLAN:  1. Chest pressure, ruled out for MI 2. Left arm pain, appears noncardiac (>6hrs but negative troponin) 3. H/o PAF with recurrence by pacemaker interrogation 4. 2nd degree heart block s/p Medtronic PPM 5. Nonobstructive CAD by cath 2005 6. Hyponatremia, appears chronic 7. Contaminated UA (no urinary sx) 8. Mild anemia, without evidence for bleeding  Signed, Beth Spies PA-C 12/09/2012, 4:31 PM  Patient seen and examined with Beth Spies, PA-C. We discussed all aspects of the encounter. I agree with the assessment and plan as stated above.   A bit of a difficult case. Overall she is adamant that she is doing quite well and seems very active for an 67+ year old. At baseline she is able to do all her activities without CP or dyspnea. However her daughters note that she seems more fatigued lately. She also notes some occasional chest heaviness and had an episode of prolonged L arm soreness yesterday. We interrogated her pacer and this does show intermittent bouts of AF (including today) but these don't correspond clearly to all her symptoms.  We discussed the fact that her fatigue may be related to PAF (or ischemia) but it also may be related to her  just getting older. Given negative cardiac markers and lack of exertional anginal symptoms I think we can let her go today and proceed with outpatient Myoview next week. We will then set her up to see Beth Santiago to discuss the possibility of an AA to keep her in NSR. She was recently taken of HCTZ due to hyponatremia but her JVP does look up and we will start lasix 20 mg MWF with KCL 20 to help with any volume overload. Can get f/u BMET as outpatient.  She is to return to ER if symptoms get worse.   Truman Hayward 6:44 PM

## 2012-12-09 NOTE — Telephone Encounter (Signed)
Beth Santiago daughter called because her mother was complaining of left arm pain the arm pain has been intermittent all week. She saw Dr. Ezzie Dural earlier this week and complained of the left arm pain at that time. She had an ECG and the daughter was told there was nothing new or different about it.  Last p.m., the pain became worse. The patient has trouble rating it. She states the pain does not worsen with different arm movements and the daughter palpated the arm. There were no tender areas. She can move the arm and the patient has sensation. The arm is warm to touch but not hot. The patient feels a little short of breath but denies chest pain or lower extremity edema.  Advised them the best way to evaluate her would be to take her to the emergency room. The patient refused. She has an appointment with Dr. Johney Frame today at noon and wishes to be seen early or keep that. Advised that we were not really set up for acute care at the office and encouraged them to call her primary care physician or go to the emergency room but the patient stated she preferred to wait and see Dr. Johney Frame. Advised her daughter to please get her to the emergency room if her symptoms worsen.

## 2012-12-09 NOTE — ED Provider Notes (Signed)
CSN: [id number]     Arrival date & time 12/09/12  1101 History   First MD Initiated Contact with Patient 12/09/12 1125     Chief Complaint  Patient presents with  . Altered Mental Status    PT states she is not herself  . Weakness   (Consider location/radiation/quality/duration/timing/severity/associated sxs/prior Treatment) HPI Comments: 79 yo female with hx of HTN, pacemaker placement, GERD, Hypothyroidism, paroxysmal atrial fib, nonobstructive CAD by cath 2005 (50% mid LAD stenosis) who presents with c/o chest heaviness starting yesterday evening accompanied by mild dyspnea. Reports pain was mild, unchanged by activity, accompanied by transient left arm discomfort, and resolved prior to arriving at the ED today. Endorses 2-3 week hx of feeling fatigued with lack of her normal activity. Does not specifically endorse activity intolerance nor pain/dyspnea with exertion. Referred to the ED by cardiologist for further evaluation. Took a full dose of ASA prior to going to cardiologist today. Denies fever, cough, N/V/D.  The history is provided by the patient and a relative.    Past Medical History  Diagnosis Date  . Complete heart block     s/p PPM by Dr Reyes Ivan 2005, generator change by Dr Johney Frame  9/11 for premature ERI  . Hypertension   . Hyperlipidemia   . DJD (degenerative joint disease)   . GERD (gastroesophageal reflux disease)   . CAD (coronary artery disease)     nonobstructive by cath 2005- 50% mid LAD stenosis  . Hypothyroid   . Paroxysmal atrial fibrillation   . Biatrial enlargement     severe   Past Surgical History  Procedure Laterality Date  . Pacemaker insertion  2005, 12/11/09    implanted by Dr Reyes Ivan, generator change 9/11 by Fawn Kirk for premature ERI  . Tonsillectomy    . Colon surgery    . Hemorroidectomy    . Tee without cardioversion  06/11/2011    Procedure: TRANSESOPHAGEAL ECHOCARDIOGRAM (TEE);  Surgeon: Dolores Patty, MD;  Location: [facility] ENDOSCOPY;  Service:  Cardiovascular;  Laterality: N/A;  . Cardioversion  06/11/2011    Procedure: CARDIOVERSION;  Surgeon: Dolores Patty, MD;  Location: [facility] ENDOSCOPY;  Service: Cardiovascular;  Laterality: N/A;  . Abdominal surgery      twisted bowel   No family history on file. History  Substance Use Topics  . Smoking status: Never Smoker   . Smokeless tobacco: Not on file  . Alcohol Use: 1.5 oz/week    3 drink(s) per week     Comment: 3/week - mixed drinks   OB History   Grav Para Term Preterm Abortions TAB SAB Ect Mult Living                 Review of Systems  Constitutional: Positive for fatigue. Negative for fever and diaphoresis.  HENT: Negative for sore throat, rhinorrhea and neck pain.   Eyes: Negative for visual disturbance.  Respiratory: Positive for shortness of breath. Negative for cough.   Cardiovascular: Positive for chest pain. Negative for palpitations and leg swelling.  Gastrointestinal: Negative for nausea, vomiting and abdominal pain.  Genitourinary: Negative for dysuria.  Musculoskeletal: Negative for back pain.  Skin: Negative for rash.  Neurological: Positive for weakness. Negative for dizziness, syncope, light-headedness, numbness and headaches.  Hematological: Negative for adenopathy.  Psychiatric/Behavioral: Negative for agitation.    Allergies  Review of patient's allergies indicates no known allergies.  Home Medications   Current Outpatient Rx  Name  Route  Sig  Dispense  Refill  . fish oil-omega-3  fatty acids 1000 MG capsule   Oral   Take 2 g by mouth daily.          . Garlic 1000 MG CAPS   Oral   Take 2 capsules by mouth daily.           . Glucosamine HCl 1000 MG TABS   Oral   Take 1 tablet by mouth daily.         Marland Kitchen latanoprost (XALATAN) 0.005 % ophthalmic solution   Both Eyes   Place 1 drop into both eyes at bedtime.           Marland Kitchen levothyroxine (SYNTHROID, LEVOTHROID) 200 MCG tablet   Oral   Take 200 mcg by mouth daily before breakfast.           . Multiple Vitamins-Minerals (CENTRUM SILVER PO)   Oral   Take 1 tablet by mouth daily.           . nebivolol (BYSTOLIC) 5 MG tablet   Oral   Take 2.5 mg by mouth daily.          . Olmesartan-Amlodipine-HCTZ (TRIBENZOR) 40-5-12.5 MG TABS   Oral   Take 1 tablet by mouth daily.         . Rivaroxaban (XARELTO) 20 MG TABS tablet   Oral   Take 1 tablet (20 mg total) by mouth daily with supper.   30 tablet   6   . rosuvastatin (CRESTOR) 20 MG tablet   Oral   Take 20 mg by mouth daily.           . timolol (BETIMOL) 0.5 % ophthalmic solution   Both Eyes   Place 1 drop into both eyes every morning.           BP 131/83  Pulse 72  Temp(Src) 97.6 F (36.4 C) (Oral)  Resp 16  SpO2 97% Physical Exam  Nursing note and vitals reviewed. Constitutional: She is oriented to person, place, and time. She appears well-developed and well-nourished.  HENT:  Head: Normocephalic and atraumatic.  Right Ear: External ear normal.  Left Ear: External ear normal.  Nose: Nose normal.  Mouth/Throat: Oropharynx is clear and moist.  Eyes: EOM are normal.  Neck: Normal range of motion. Neck supple.  Cardiovascular: Normal rate, regular rhythm, normal heart sounds and intact distal pulses.   Pulmonary/Chest: Effort normal and breath sounds normal. She has no rales.  Abdominal: Soft. Bowel sounds are normal. She exhibits no distension. There is no tenderness.  Musculoskeletal: Normal range of motion. She exhibits no edema and no tenderness.  Lymphadenopathy:    She has no cervical adenopathy.  Neurological: She is alert and oriented to person, place, and time.  Skin: Skin is warm and dry.  Psychiatric: She has a normal mood and affect.    ED Course  Procedures (including critical care time) Labs Review Labs Reviewed  CBC - Abnormal; Notable for the following:    RBC 3.69 (*)    Hemoglobin 11.9 (*)    HCT 33.3 (*)    All other components within normal limits   COMPREHENSIVE METABOLIC PANEL - Abnormal; Notable for the following:    Sodium 131 (*)    Glucose, Bld 107 (*)    ALT 36 (*)    GFR calc non Af Amer 79 (*)    All other components within normal limits  URINALYSIS, ROUTINE W REFLEX MICROSCOPIC - Abnormal; Notable for the following:    APPearance CLOUDY (*)  Hgb urine dipstick SMALL (*)    Leukocytes, UA LARGE (*)    All other components within normal limits  URINE MICROSCOPIC-ADD ON - Abnormal; Notable for the following:    Squamous Epithelial / LPF MANY (*)    Bacteria, UA MANY (*)    All other components within normal limits  URINE CULTURE  POCT I-STAT TROPONIN I  POCT I-STAT TROPONIN I   Imaging Review Dg Chest 2 View  12/09/2012   CLINICAL DATA:  Chest pressure and weakness. Heart block and hypertension.  EXAM: CHEST  2 VIEW  COMPARISON:  08/10/2012.  FINDINGS: Sequential pacemaker enters from the left with leads in the region of the right atrium and right ventricle without obvious interruption appearing similar to the prior exam.  Scoliosis lumbar spine convex to left.  Heart size top-normal.  Calcified mildly tortuous aorta.  Chronic lung changes stable with bilateral apical pleural thickening greater on right without associated bony destruction.  Eventration left hemidiaphragm stable.  No segmental infiltrate.  Pulmonary vascular prominence stable.  IMPRESSION: No acute abnormality. Please see above.   Electronically Signed   By: Bridgett Larsson   On: 12/09/2012 12:25    MDM   1. Fatigue   2. Atrial fibrillation   3. Precordial chest pain    78 yo female with hx of HTN, pacemaker, paroxysmal atrial fib presents with c/o chest heaviness, fatigue and left arm pain. EKG reveals paced rhythm. VSS. Labs reveal mild anemia, similar to baseline, contaminated UA (many epithelial cells), slightly low sodium. Initial troponin neg. CXR neg for pneumonia, and similar to previous imaging. No evidence to suggest infectious etiology of  symptoms. Chest pain free in the ED. Concern for ACS, 3 hour troponin and cardiology consulted for evaluation.   Report to Dr. Bernette Mayers, cardiology consult pending. Remains chest pain free with stable VS.    Simmie Davies, NP 12/09/12 2227

## 2012-12-09 NOTE — ED Provider Notes (Signed)
Care assumed at the change of shift pending Cardiology evaluation. I spoke with [facility] team who have evaluated the patient and interrogated her pacemaker. They have cleared the patient for discharge with close followup. Rx for Lasix and K from them. Pt ready to go home.   Charles B. Bernette Mayers, MD 12/09/12 [phone]

## 2012-12-09 NOTE — ED Notes (Addendum)
Pt states she does not feel like herself. Pt's daughter states that when the pt was at the MD's her Na was low. Pt's daughter states that the pt's left arm is hurting. Pt states that her chest feels heavy. Pt states that she was SOB last night and this morning it was hard to "fill her lungs."

## 2012-12-10 NOTE — ED Provider Notes (Signed)
Medical screening examination/treatment/procedure(s) were conducted as a shared visit with non-physician practitioner(s) and myself.  I personally evaluated the patient during the encounter  THis is an 79 yo with 1 week of chest heaviness and left arm discomfort.  History of HTN and HLD.  Currently pain free. Exam benign and VSS. Troponin, EKG and chest xray reassuring.  WIll be admitted for ACS r/o.  EKG paced rhythm: Paced rhythm with rate of 62, no evidence of acute ischemia, unchanged from prior.  Shon Baton, MD 12/10/12 [phone]

## 2012-12-11 LAB — URINE CULTURE: Colony Count: >=100000

## 2012-12-12 NOTE — Progress Notes (Signed)
  This encounter was created in error - please disregard. Patient was sent to [facility] ED for evaluation of chest and left arm pain.

## 2012-12-15 NOTE — Telephone Encounter (Signed)
New problem   Beth Santiago from answering service stated pt's daughter called this morning and stated pt's BP is 136/80 and pulse 68. Please call pt

## 2012-12-15 NOTE — Telephone Encounter (Signed)
Spoke with daughter and let her know her BP is good.  Went over her stress test time and up coming appointments.  She was appreciative of my call and will call back if she has other problems

## 2012-12-20 ENCOUNTER — Ambulatory Visit (HOSPITAL_COMMUNITY): Payer: Medicare Other | Attending: Cardiology | Admitting: Radiology

## 2012-12-20 VITALS — BP 117/81 | HR 62 | Ht 67.0 in | Wt 168.0 lb

## 2012-12-20 DIAGNOSIS — I10 Essential (primary) hypertension: Secondary | ICD-10-CM | POA: Insufficient documentation

## 2012-12-20 DIAGNOSIS — R0789 Other chest pain: Secondary | ICD-10-CM | POA: Insufficient documentation

## 2012-12-20 DIAGNOSIS — R079 Chest pain, unspecified: Secondary | ICD-10-CM

## 2012-12-20 DIAGNOSIS — I4891 Unspecified atrial fibrillation: Secondary | ICD-10-CM

## 2012-12-20 DIAGNOSIS — I2510 Atherosclerotic heart disease of native coronary artery without angina pectoris: Secondary | ICD-10-CM

## 2012-12-20 DIAGNOSIS — R002 Palpitations: Secondary | ICD-10-CM | POA: Insufficient documentation

## 2012-12-20 DIAGNOSIS — R0602 Shortness of breath: Secondary | ICD-10-CM | POA: Insufficient documentation

## 2012-12-20 DIAGNOSIS — R5381 Other malaise: Secondary | ICD-10-CM | POA: Insufficient documentation

## 2012-12-20 DIAGNOSIS — E785 Hyperlipidemia, unspecified: Secondary | ICD-10-CM | POA: Insufficient documentation

## 2012-12-20 DIAGNOSIS — I779 Disorder of arteries and arterioles, unspecified: Secondary | ICD-10-CM | POA: Insufficient documentation

## 2012-12-20 DIAGNOSIS — I447 Left bundle-branch block, unspecified: Secondary | ICD-10-CM | POA: Insufficient documentation

## 2012-12-20 MED ORDER — ADENOSINE (DIAGNOSTIC) 3 MG/ML IV SOLN
0.5600 mg/kg | Freq: Once | INTRAVENOUS | Status: AC
  Administered 2012-12-20: 42.6 mg via INTRAVENOUS

## 2012-12-20 MED ORDER — TECHNETIUM TC 99M SESTAMIBI GENERIC - CARDIOLITE
33.0000 | Freq: Once | INTRAVENOUS | Status: AC | PRN
  Administered 2012-12-20: 33 via INTRAVENOUS

## 2012-12-20 MED ORDER — TECHNETIUM TC 99M SESTAMIBI GENERIC - CARDIOLITE
11.0000 | Freq: Once | INTRAVENOUS | Status: AC | PRN
  Administered 2012-12-20: 11 via INTRAVENOUS

## 2012-12-20 NOTE — Progress Notes (Signed)
[facility] NUCLEAR MED [address] [place], [place] [address] [phone]    Cardiology Nuclear Med Study  Beth Santiago is a 79 y.o. female     MRN : [id number]     DOB: 1924-06-18  Procedure Date: 12/20/2012  Nuclear Med Background Indication for Stress Test:  Evaluation for Ischemia History:  '05 PTVP; '07 Cath:n/o CAD, EF=60%; '13 Echo:EF=55%; '13 Cardioversion; '13 MPS:no ischemia, EF=79%; h/o atrial fibrillation and; 12/09/12 ED with chest and left arm pain, negative enzymes. Cardiac Risk Factors: Carotid Disease, Hypertension, LBBB and Lipids  Symptoms:  Chest Pressure.  (last date of chest discomfort was last night), Fatigue, Palpitations and SOB   Nuclear Pre-Procedure Caffeine/Decaff Intake:  None > 12 hrs NPO After: 8:00am   Lungs:  Clear. O2 Sat: 98% on room air. IV 0.9% NS with Angio Cath:  22g  IV Site: R Hand x 1, tolerated well IV Started by:  Irean Hong, RN  Chest Size (in):  40 Cup Size: D  Height: 5\' 7"  (1.702 m)  Weight:  168 lb (76.204 kg)  BMI:  Body mass index is 26.31 kg/(m^2). 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 am    Nuclear Med Study 1 or 2 day study: 1 day  Stress Test Type:  Adenosine  Reading MD: Cassell Clement, MD  Order Authorizing Provider:  [name], MD  Resting Radionuclide: Technetium 23m Sestamibi  Resting Radionuclide Dose: 11.0 mCi   Stress Radionuclide:  Technetium 70m Sestamibi  Stress Radionuclide Dose: 33.0 mCi           Stress Protocol Rest HR: 62 Stress HR: 64  Rest BP: 117/81 Stress BP: 127/77  Exercise Time (min): n/a METS: n/a   Predicted Max HR: 134 bpm % Max HR: 47.76 bpm Rate Pressure Product: 8128   Dose of Adenosine (mg):  42.8 Dose of Lexiscan: n/a mg  Dose of Atropine (mg): n/a Dose of Dobutamine: n/a mcg/kg/min (at max HR)  Stress Test Technologist: Smiley Houseman, CMA-N  Nuclear Technologist:  Doyne Keel, CNMT     Rest Procedure:  Myocardial perfusion imaging was performed  at rest 45 minutes following the intravenous administration of Technetium 48m Sestamibi.  Rest ECG: NSR-LBBB from ventricular pacemaker.  Stress Procedure:  The patient received IV adenosine at 140 mcg/kg/min for 4 minutes.  She only c/o weakness with infusion.  Technetium 32m Sestamibi was injected at the 2 minute mark and quantitative spect images were obtained after a 45 minute delay.  Stress ECG: Uninteretable due to baseline LBBB  QPS Raw Data Images:  Normal; no motion artifact; normal heart/lung ratio. Stress Images:  Normal homogeneous uptake in all areas of the myocardium. Rest Images:  Normal homogeneous uptake in all areas of the myocardium. Subtraction (SDS):  No evidence of ischemia. Transient Ischemic Dilatation (Normal <1.22):  n/a Lung/Heart Ratio (Normal <0.45):  0.49  Quantitative Gated Spect Images QGS EDV:  67 ml QGS ESV:  16 ml  Impression Exercise Capacity:  Adenosine study with no exercise. BP Response:  Normal blood pressure response. Clinical Symptoms:  No significant symptoms noted. ECG Impression:  Baseline:  LBBB.  EKG uninterpretable due to LBBB at rest and stress. Comparison with Prior Nuclear Study: No significant change from previous study  Overall Impression:  Normal stress nuclear study.  LV Ejection Fraction: 77%.  LV Wall Motion:  NL LV Function; NL Wall Motion   [employer]

## 2012-12-26 ENCOUNTER — Telehealth: Payer: Self-pay | Admitting: Internal Medicine

## 2012-12-26 NOTE — Telephone Encounter (Signed)
Daughter aware of test results and follow up with Dr Johney Frame

## 2012-12-26 NOTE — Telephone Encounter (Signed)
New problem   Stress test results.

## 2013-01-03 ENCOUNTER — Ambulatory Visit (INDEPENDENT_AMBULATORY_CARE_PROVIDER_SITE_OTHER): Payer: Medicare Other | Admitting: Internal Medicine

## 2013-01-03 ENCOUNTER — Encounter: Payer: Self-pay | Admitting: Internal Medicine

## 2013-01-03 ENCOUNTER — Telehealth: Payer: Self-pay | Admitting: Internal Medicine

## 2013-01-03 ENCOUNTER — Encounter: Payer: Self-pay | Admitting: *Deleted

## 2013-01-03 VITALS — BP 110/70 | HR 71 | Ht 67.0 in | Wt 169.0 lb

## 2013-01-03 DIAGNOSIS — I4891 Unspecified atrial fibrillation: Secondary | ICD-10-CM

## 2013-01-03 DIAGNOSIS — I442 Atrioventricular block, complete: Secondary | ICD-10-CM

## 2013-01-03 DIAGNOSIS — Z950 Presence of cardiac pacemaker: Secondary | ICD-10-CM

## 2013-01-03 DIAGNOSIS — E785 Hyperlipidemia, unspecified: Secondary | ICD-10-CM

## 2013-01-03 DIAGNOSIS — I10 Essential (primary) hypertension: Secondary | ICD-10-CM

## 2013-01-03 MED ORDER — AMLODIPINE BESYLATE 5 MG PO TABS: 5.0000 mg | ORAL_TABLET | Freq: Every day | ORAL | Status: DC

## 2013-01-03 MED ORDER — METOPROLOL SUCCINATE ER 50 MG PO TB24: 50.0000 mg | ORAL_TABLET | Freq: Every day | ORAL | Status: DC

## 2013-01-03 MED ORDER — ATORVASTATIN CALCIUM 40 MG PO TABS: 40.0000 mg | ORAL_TABLET | Freq: Every day | ORAL | Status: DC

## 2013-01-03 NOTE — Patient Instructions (Addendum)
YOU WILL BE HAVING YOUR CARDIOVERSION TOMORROW 01-04-2013 AT 3:00 PM  Your physician has recommended you make the following change in your medication:   STOP ASPIRIN STOP AZOR STOP BYSTOLIC STOP CRESTOR  START AMLODIPINE  START METOPROLOL SUCCINATE START ATORVASTATIN  WE RECEIVED XARELTO SAMPLES TODAY  Your physician recommends that you return for lab work in: CBC BMET

## 2013-01-03 NOTE — Progress Notes (Signed)
 PCP:  SOUTH,[name] ALAN, MD  The patient presents today for routine electrophysiology followup.  She has developed fatigue and decreased exercise tolerance over the past 6 weeks.  She recently was seen in the ER and found to be in afib.  No changes were made and she was instructed to follow-up with me.  Her symptoms clearly correlate to recent afib.  She is not happy with her present health state and would like to pursue sinus rhythm.  Today, she denies symptoms of palpitations, chest pain,  orthopnea, PND, lower extremity edema, dizziness, presyncope, syncope, or neurologic sequela.  The patient feels that she is tolerating medications without difficulties and is otherwise without complaint today.   Past Medical History  Diagnosis Date  . Second degree heart block     a. s/p PPM 2005. b. Gen change to Medtronic by Dr Brinnley Lacap  9/11 for premature ERI 11/2009.  . Hypertension   . Hyperlipidemia   . DJD (degenerative joint disease)   . GERD (gastroesophageal reflux disease)   . CAD (coronary artery disease)     a. Nonobstructive by cath 2005 - 40-50% mid LAD. b. Nonischemic nuc 05/2011.  . Hypothyroid   . Persistent atrial fibrillation 05/2011  . Biatrial enlargement     severe  . Carotid artery disease     a. 1-50% bilateral (upper end) - March 2011.  . Atrial flutter     a. Noted during pacemaker implantation 2011, spontaneously terminated.   Past Surgical History  Procedure Laterality Date  . Pacemaker insertion  2005, 12/11/09    implanted by Dr Kersey, generator change 9/11 by JA for premature ERI  . Tonsillectomy    . Colon surgery    . Hemorroidectomy    . Tee without cardioversion  06/11/2011    Procedure: TRANSESOPHAGEAL ECHOCARDIOGRAM (TEE);  Surgeon: Daniel R Bensimhon, MD;  Location: [facility] ENDOSCOPY;  Service: Cardiovascular;  Laterality: N/A;  . Cardioversion  06/11/2011    Procedure: CARDIOVERSION;  Surgeon: Daniel R Bensimhon, MD;  Location: [facility] ENDOSCOPY;  Service:  Cardiovascular;  Laterality: N/A;  . Abdominal surgery      twisted bowel    Current Outpatient Prescriptions  Medication Sig Dispense Refill  . fish oil-omega-3 fatty acids 1000 MG capsule Take 2 g by mouth daily.       . furosemide (LASIX) 20 MG tablet Take 1 tablet (20 mg total) by mouth every Monday, Wednesday, and Friday.  30 tablet  0  . Garlic 1000 MG CAPS Take 2 capsules by mouth daily.        . latanoprost (XALATAN) 0.005 % ophthalmic solution Place 1 drop into both eyes at bedtime.        . levothyroxine (SYNTHROID, LEVOTHROID) 100 MCG tablet Take 100 mcg by mouth daily before breakfast.      . Multiple Vitamins-Minerals (CENTRUM SILVER PO) Take 1 tablet by mouth daily.        . omeprazole (PRILOSEC) 20 MG capsule Take 20 mg by mouth daily.      . potassium chloride SA (KLOR-CON M20) 20 MEQ tablet Take 1 tablet (20 mEq total) by mouth every Monday, Wednesday, and Friday.  30 tablet  0  . Rivaroxaban (XARELTO) 20 MG TABS tablet Take 1 tablet (20 mg total) by mouth daily with supper.  30 tablet  6  . timolol (BETIMOL) 0.5 % ophthalmic solution Place 1 drop into both eyes every morning.       . amLODipine (NORVASC) 5 MG tablet Take   1 tablet (5 mg total) by mouth daily.  30 tablet  3  . atorvastatin (LIPITOR) 40 MG tablet Take 1 tablet (40 mg total) by mouth daily.  30 tablet  3  . metoprolol succinate (TOPROL-XL) 50 MG 24 hr tablet Take 1 tablet (50 mg total) by mouth daily. Take with or immediately following a meal.  30 tablet  3  . [DISCONTINUED] hydrochlorothiazide (MICROZIDE) 12.5 MG capsule Take 12.5 mg by mouth daily.        . [DISCONTINUED] olmesartan (BENICAR) 40 MG tablet Take 40 mg by mouth daily.         No current facility-administered medications for this visit.    No Known Allergies  History   Social History  . Marital Status: Widowed    Spouse Name: N/A    Number of Children: N/A  . Years of Education: N/A   Occupational History  . Not on file.   Social  History Main Topics  . Smoking status: Never Smoker   . Smokeless tobacco: Not on file  . Alcohol Use: 1.5 oz/week    3 drink(s) per week     Comment: 3/week - mixed drinks  . Drug Use: No  . Sexual Activity: Not on file   Other Topics Concern  . Not on file   Social History Narrative  . No narrative on file    Physical Exam: Filed Vitals:   01/03/13 1537  BP: 110/70  Pulse: 71  Height: 5' 7" (1.702 m)  Weight: 169 lb (76.658 kg)    GEN- The patient is well appearing, alert and oriented x 3 today.   Head- normocephalic, atraumatic Eyes-  Sclera clear, conjunctiva pink Ears- hearing intact Oropharynx- clear Neck- supple, no JVP Lymph- no cervical lymphadenopathy Lungs- Clear to ausculation bilaterally, normal work of breathing Heart- Regular rate and rhythm (paced) GI- soft, NT, ND, + BS Extremities- no clubbing, cyanosis, or edema MS- no significant deformity or atrophy Skin- no rash or lesion Psych- euthymic mood, full affect Neuro- strength and sensation are intact  ekg today reveals afib with V pacing at 70 bpm Device interrogation is reviewed (see paceart)  Assessment and Plan:  1. Persistent afib The patient presents today with symptomatic persistent afib.  This is not surprising given her severe biatrial enlargement.  Therapeutic strategies for afib including rate control and rhythm control were discussed in detail with the patient today. Risk, benefits, and alternatives to antiarrhythmic drug therapy and also cardioversion for afib were also discussed in detail today.  She is appropriately anticoagulated with xarelto.  She would like to proceed with cardioversion without AAD therapy at this time.  IF she has recurrence of her afib post cardioversion then should would consider an AAD at that point.  I think that her options would include flecainide, tikosyn, and amiodarone long term.  I would probably start with flecainide. She will proceed with cardioversion  tomorrow and I will see her again in 4 weeks As she does not have obstructive CAD, I will stop ASA at this time to avoid excessive bleeding in combination with xarelto.  2. Complete heart block Normal pacemaker function See Pace Art report No changes today  3. HTN Stable She reports difficulty affording medicines and requests that I switch her antihypertensives to generic I will switch bystolic to metoprolol succinate 50mg daily I will switch azor to amlodipine 5mg daily (will not restart ARB given difficulty with hyponatremia)  4. HL As above, will switch crestor to atorvastatin.    She will need to follow-up with Dr South for fasting lipids in 6 weeks  5. Hyponatremia She has scheduled labs with Dr South for later this week Stop ARB as above  Return to see me in 4 weeks 

## 2013-01-03 NOTE — Telephone Encounter (Signed)
Will add on for tomorrow to check device and assess her afib.  Beth Santiago is going to call her daughter

## 2013-01-03 NOTE — Telephone Encounter (Signed)
New Problem  Pt's DTR called pt has had a lot of episodes w/ Afib and great weakness.. Pt request to see dr. Johney Frame before the 10/30. DTR request a call back to discuss the pacemaker readings because of the ongoing episodes. Please assist

## 2013-01-04 ENCOUNTER — Ambulatory Visit (HOSPITAL_COMMUNITY)
Admission: RE | Admit: 2013-01-04 | Discharge: 2013-01-04 | Disposition: A | Discharge status: Home / Self Care | Payer: Medicare Other | Source: Ambulatory Visit | Attending: Internal Medicine | Admitting: Internal Medicine

## 2013-01-04 ENCOUNTER — Encounter (HOSPITAL_COMMUNITY)
Admission: RE | Disposition: A | Discharge status: Home / Self Care | Payer: Self-pay | Source: Ambulatory Visit | Attending: Internal Medicine

## 2013-01-04 ENCOUNTER — Encounter: Payer: Medicare Other | Admitting: Internal Medicine

## 2013-01-04 ENCOUNTER — Ambulatory Visit (HOSPITAL_COMMUNITY): Payer: Medicare Other | Admitting: Anesthesiology

## 2013-01-04 ENCOUNTER — Encounter (HOSPITAL_COMMUNITY): Payer: Self-pay | Admitting: *Deleted

## 2013-01-04 ENCOUNTER — Encounter (HOSPITAL_COMMUNITY): Payer: Medicare Other | Admitting: Anesthesiology

## 2013-01-04 DIAGNOSIS — I10 Essential (primary) hypertension: Secondary | ICD-10-CM | POA: Insufficient documentation

## 2013-01-04 DIAGNOSIS — Z79899 Other long term (current) drug therapy: Secondary | ICD-10-CM | POA: Insufficient documentation

## 2013-01-04 DIAGNOSIS — I4891 Unspecified atrial fibrillation: Secondary | ICD-10-CM | POA: Insufficient documentation

## 2013-01-04 DIAGNOSIS — I2510 Atherosclerotic heart disease of native coronary artery without angina pectoris: Secondary | ICD-10-CM | POA: Insufficient documentation

## 2013-01-04 DIAGNOSIS — Z950 Presence of cardiac pacemaker: Secondary | ICD-10-CM | POA: Insufficient documentation

## 2013-01-04 DIAGNOSIS — E039 Hypothyroidism, unspecified: Secondary | ICD-10-CM | POA: Insufficient documentation

## 2013-01-04 DIAGNOSIS — M1990 Unspecified osteoarthritis, unspecified site: Secondary | ICD-10-CM | POA: Insufficient documentation

## 2013-01-04 DIAGNOSIS — I739 Peripheral vascular disease, unspecified: Secondary | ICD-10-CM | POA: Insufficient documentation

## 2013-01-04 DIAGNOSIS — I6529 Occlusion and stenosis of unspecified carotid artery: Secondary | ICD-10-CM | POA: Insufficient documentation

## 2013-01-04 DIAGNOSIS — K219 Gastro-esophageal reflux disease without esophagitis: Secondary | ICD-10-CM | POA: Insufficient documentation

## 2013-01-04 HISTORY — PX: CARDIOVERSION: SHX1299

## 2013-01-04 LAB — CBC WITH DIFFERENTIAL/PLATELET
Basophils Relative: 0.2 % (ref 0.0–3.0)
Eosinophils Absolute: 0.2 10*3/uL (ref 0.0–0.7)
HCT: 38 % (ref 36.0–46.0)
Hemoglobin: 12.5 g/dL (ref 12.0–15.0)
Lymphocytes Relative: 17.1 % (ref 12.0–46.0)
Lymphs Abs: 1.3 10*3/uL (ref 0.7–4.0)
MCHC: 32.8 g/dL (ref 30.0–36.0)
MCV: 93.8 fl (ref 78.0–100.0)
Neutro Abs: 5.3 10*3/uL (ref 1.4–7.7)
RBC: 4.06 Mil/uL (ref 3.87–5.11)
RDW: 13.2 % (ref 11.5–14.6)

## 2013-01-04 LAB — BASIC METABOLIC PANEL
CO2: 26 mEq/L (ref 19–32)
Calcium: 9.3 mg/dL (ref 8.4–10.5)
Chloride: 103 mEq/L (ref 96–112)
Glucose, Bld: 122 mg/dL — ABNORMAL HIGH (ref 70–99)
Potassium: 4.8 mEq/L (ref 3.5–5.1)
Sodium: 135 mEq/L (ref 135–145)

## 2013-01-04 LAB — PACEMAKER DEVICE OBSERVATION
ATRIAL PACING PM: 85
BATTERY VOLTAGE: 2.78 V
BRDY-0004RV: 130 {beats}/min
RV LEAD THRESHOLD: 1.25 V

## 2013-01-04 LAB — PROTIME-INR: INR: 1.9 ratio — ABNORMAL HIGH (ref 0.8–1.0)

## 2013-01-04 SURGERY — CARDIOVERSION
Anesthesia: General | Wound class: Clean

## 2013-01-04 MED ORDER — SODIUM CHLORIDE 0.9 % IV SOLN
INTRAVENOUS | Status: DC | PRN
  Administered 2013-01-04: 15:00:00 via INTRAVENOUS

## 2013-01-04 MED ORDER — PROPOFOL 10 MG/ML IV BOLUS
INTRAVENOUS | Status: DC | PRN
  Administered 2013-01-04: 40 mg via INTRAVENOUS

## 2013-01-04 MED ORDER — LIDOCAINE HCL (CARDIAC) 10 MG/ML IV SOLN
INTRAVENOUS | Status: DC | PRN
  Administered 2013-01-04: 40 mg via INTRAVENOUS

## 2013-01-04 NOTE — Interval H&P Note (Signed)
History and Physical Interval Note:  01/04/2013 3:15 PM  Beth Santiago  has presented today for surgery, with the diagnosis of A FIB  The various methods of treatment have been discussed with the patient and family. After consideration of risks, benefits and other options for treatment, the patient has consented to  Procedure(s): CARDIOVERSION (N/A) as a surgical intervention .  The patient's history has been reviewed, patient examined, no change in status, stable for surgery.  I have reviewed the patient's chart and labs.  Questions were answered to the patient's satisfaction.     Tobias Alexander, H

## 2013-01-04 NOTE — Anesthesia Preprocedure Evaluation (Addendum)
Anesthesia Evaluation  Patient identified by MRN, date of birth, ID band Patient awake    Reviewed: Allergy & Precautions  History of Anesthesia Complications Negative for: history of anesthetic complications  Airway Mallampati: I TM Distance: >3 FB Neck ROM: Full    Dental  (+) Teeth Intact   Pulmonary neg pulmonary ROS,    Pulmonary exam normal       Cardiovascular hypertension, + CAD and + Peripheral Vascular Disease + dysrhythmias Atrial Fibrillation + pacemaker Rhythm:Regular Rate:Normal  Pacemaker rate and rhythm   Neuro/Psych negative neurological ROS     GI/Hepatic Neg liver ROS, GERD-  ,  Endo/Other  Hypothyroidism   Renal/GU negative Renal ROS     Musculoskeletal negative musculoskeletal ROS (+)   Abdominal Normal abdominal exam  (+)   Peds  Hematology negative hematology ROS (+)   Anesthesia Other Findings   Reproductive/Obstetrics negative OB ROS                           Anesthesia Physical Anesthesia Plan  ASA: III  Anesthesia Plan: General   Post-op Pain Management:    Induction: Intravenous  Airway Management Planned: Mask  Additional Equipment:   Intra-op Plan:   Post-operative Plan:   Informed Consent:   Plan Discussed with:   Anesthesia Plan Comments:         Anesthesia Quick Evaluation

## 2013-01-04 NOTE — Anesthesia Postprocedure Evaluation (Signed)
  Anesthesia Post-op Note  Patient: Beth Santiago  Procedure(s) Performed: Procedure(s): CARDIOVERSION (N/A)  Patient Location: PACU  Anesthesia Type:General  Level of Consciousness: awake, alert , oriented and patient cooperative  Airway and Oxygen Therapy: Patient Spontanous Breathing and Patient connected to nasal cannula oxygen  Post-op Pain: none  Post-op Assessment: Post-op Vital signs reviewed  Post-op Vital Signs: Reviewed and stable  Complications: No apparent anesthesia complications

## 2013-01-04 NOTE — H&P (View-Only) (Signed)
PCP:  [name], MD  The patient presents today for routine electrophysiology followup.  She has developed fatigue and decreased exercise tolerance over the past 6 weeks.  She recently was seen in the ER and found to be in afib.  No changes were made and she was instructed to follow-up with me.  Her symptoms clearly correlate to recent afib.  She is not happy with her present health state and would like to pursue sinus rhythm.  Today, she denies symptoms of palpitations, chest pain,  orthopnea, PND, lower extremity edema, dizziness, presyncope, syncope, or neurologic sequela.  The patient feels that she is tolerating medications without difficulties and is otherwise without complaint today.   Past Medical History  Diagnosis Date  . Second degree heart block     a. s/p PPM 2005. b. Gen change to [employer]tr[employer] by Dr Johney Frame  9/11 for premature ERI 11/2009.  Marland Kitchen Hypertension   . Hyperlipidemia   . DJD (degenerative joint disease)   . GERD (gastroesophageal reflux disease)   . CAD (coronary artery disease)     a. Nonobstructive by cath 2005 - 40-50% mid LAD. b. Nonischemic nuc 05/2011.  Marland Kitchen Hypothyroid   . Persistent atrial fibrillation 05/2011  . Biatrial enlargement     severe  . Carotid artery disease     a. 1-50% bilateral (upper end) - March 2011.  . Atrial flutter     a. Noted during pacemaker implantation 2011, spontaneously terminated.   Past Surgical History  Procedure Laterality Date  . Pacemaker insertion  2005, 12/11/09    implanted by Dr Reyes Ivan, generator change 9/11 by Fawn Kirk for premature ERI  . Tonsillectomy    . Colon surgery    . Hemorroidectomy    . Tee without cardioversion  06/11/2011    Procedure: TRANSESOPHAGEAL ECHOCARDIOGRAM (TEE);  Surgeon: Dolores Patty, MD;  Location: [facility] ENDOSCOPY;  Service: Cardiovascular;  Laterality: N/A;  . Cardioversion  06/11/2011    Procedure: CARDIOVERSION;  Surgeon: Dolores Patty, MD;  Location: [facility] ENDOSCOPY;  Service:  Cardiovascular;  Laterality: N/A;  . Abdominal surgery      twisted bowel    Current Outpatient Prescriptions  Medication Sig Dispense Refill  . fish oil-omega-3 fatty acids 1000 MG capsule Take 2 g by mouth daily.       . furosemide (LASIX) 20 MG tablet Take 1 tablet (20 mg total) by mouth every Monday, Wednesday, and Friday.  30 tablet  0  . Garlic 1000 MG CAPS Take 2 capsules by mouth daily.        Marland Kitchen latanoprost (XALATAN) 0.005 % ophthalmic solution Place 1 drop into both eyes at bedtime.        Marland Kitchen levothyroxine (SYNTHROID, LEVOTHROID) 100 MCG tablet Take 100 mcg by mouth daily before breakfast.      . Multiple Vitamins-Minerals (CENTRUM SILVER PO) Take 1 tablet by mouth daily.        Marland Kitchen omeprazole (PRILOSEC) 20 MG capsule Take 20 mg by mouth daily.      . potassium chloride SA (KLOR-CON M20) 20 MEQ tablet Take 1 tablet (20 mEq total) by mouth every Monday, Wednesday, and Friday.  30 tablet  0  . Rivaroxaban (XARELTO) 20 MG TABS tablet Take 1 tablet (20 mg total) by mouth daily with supper.  30 tablet  6  . timolol (BETIMOL) 0.5 % ophthalmic solution Place 1 drop into both eyes every morning.       Marland Kitchen amLODipine (NORVASC) 5 MG tablet Take  1 tablet (5 mg total) by mouth daily.  30 tablet  3  . atorvastatin (LIPITOR) 40 MG tablet Take 1 tablet (40 mg total) by mouth daily.  30 tablet  3  . metoprolol succinate (TOPROL-XL) 50 MG 24 hr tablet Take 1 tablet (50 mg total) by mouth daily. Take with or immediately following a meal.  30 tablet  3  . [DISCONTINUED] hydrochlorothiazide (MICROZIDE) 12.5 MG capsule Take 12.5 mg by mouth daily.        . [DISCONTINUED] olmesartan (BENICAR) 40 MG tablet Take 40 mg by mouth daily.         No current facility-administered medications for this visit.    No Known Allergies  History   Social History  . Marital Status: Widowed    Spouse Name: N/A    Number of Children: N/A  . Years of Education: N/A   Occupational History  . Not on file.   Social  History Main Topics  . Smoking status: Never Smoker   . Smokeless tobacco: Not on file  . Alcohol Use: 1.5 oz/week    3 drink(s) per week     Comment: 3/week - mixed drinks  . Drug Use: No  . Sexual Activity: Not on file   Other Topics Concern  . Not on file   Social History Narrative  . No narrative on file    Physical Exam: Filed Vitals:   01/03/13 1537  BP: 110/70  Pulse: 71  Height: 5\' 7"  (1.702 m)  Weight: 169 lb (76.658 kg)    GEN- The patient is well appearing, alert and oriented x 3 today.   Head- normocephalic, atraumatic Eyes-  Sclera clear, conjunctiva pink Ears- hearing intact Oropharynx- clear Neck- supple, no JVP Lymph- no cervical lymphadenopathy Lungs- Clear to ausculation bilaterally, normal work of breathing Heart- Regular rate and rhythm (paced) GI- soft, NT, ND, + BS Extremities- no clubbing, cyanosis, or edema MS- no significant deformity or atrophy Skin- no rash or lesion Psych- euthymic mood, full affect Neuro- strength and sensation are intact  ekg today reveals afib with V pacing at 70 bpm Device interrogation is reviewed (see paceart)  Assessment and Plan:  1. Persistent afib The patient presents today with symptomatic persistent afib.  This is not surprising given her severe biatrial enlargement.  Therapeutic strategies for afib including rate control and rhythm control were discussed in detail with the patient today. Risk, benefits, and alternatives to antiarrhythmic drug therapy and also cardioversion for afib were also discussed in detail today.  She is appropriately anticoagulated with xarelto.  She would like to proceed with cardioversion without AAD therapy at this time.  IF she has recurrence of her afib post cardioversion then should would consider an AAD at that point.  I think that her options would include flecainide, tikosyn, and amiodarone long term.  I would probably start with flecainide. She will proceed with cardioversion  tomorrow and I will see her again in 4 weeks As she does not have obstructive CAD, I will stop ASA at this time to avoid excessive bleeding in combination with xarelto.  2. Complete heart block Normal pacemaker function See Pace Art report No changes today  3. HTN Stable She reports difficulty affording medicines and requests that I switch her antihypertensives to generic I will switch bystolic to metoprolol succinate 50mg  daily I will switch azor to amlodipine 5mg  daily (will not restart ARB given difficulty with hyponatremia)  4. HL As above, will switch crestor to atorvastatin.  She will need to follow-up with Dr Evlyn Kanner for fasting lipids in 6 weeks  5. Hyponatremia She has scheduled labs with Dr Evlyn Kanner for later this week Stop ARB as above  Return to see me in 4 weeks

## 2013-01-04 NOTE — CV Procedure (Signed)
    Cardioversion Note  Beth Santiago [id number] 05-26-1924  Procedure: DC Cardioversion Indications: atrial fibrillation  Procedure Details Consent: Obtained Time Out: Verified patient identification, verified procedure, site/side was marked, verified correct patient position, special equipment/implants available, Radiology Safety Procedures followed,  medications/allergies/relevent history reviewed, required imaging and test results available.  Performed  The patient has been on adequate anticoagulation.  The patient received 40 mg of iv Propofol and 40 mg of iv Lidocain for sedation.  Synchronous cardioversion was performed at 120 joules.  The cardioversion was successful.    Complications: No apparent complications Patient did tolerate procedure well.   Tobias Alexander, Rexene Edison, MD, [facility] 01/04/2013, 3:20 PM

## 2013-01-04 NOTE — Transfer of Care (Signed)
Immediate Anesthesia Transfer of Care Note  Patient: Beth Santiago  Procedure(s) Performed: Procedure(s): CARDIOVERSION (N/A)  Patient Location: PACU  Anesthesia Type:General  Level of Consciousness: awake, alert  and unresponsive  Airway & Oxygen Therapy: Patient Spontanous Breathing and Patient connected to nasal cannula oxygen  Post-op Assessment: Report given to PACU RN, Post -op Vital signs reviewed and stable and Patient moving all extremities  Post vital signs: Reviewed and stable  Complications: No apparent anesthesia complications

## 2013-01-05 ENCOUNTER — Encounter: Payer: Self-pay | Admitting: Internal Medicine

## 2013-01-05 ENCOUNTER — Encounter (HOSPITAL_COMMUNITY): Payer: Self-pay | Admitting: Cardiology

## 2013-01-06 ENCOUNTER — Telehealth: Payer: Self-pay | Admitting: Internal Medicine

## 2013-01-06 NOTE — Telephone Encounter (Signed)
New message   Mom had cardioversion wed---want to talk to [facility]

## 2013-01-06 NOTE — Telephone Encounter (Signed)
Will get her mom in for a EPH in the next few weeks

## 2013-01-19 ENCOUNTER — Encounter: Payer: Medicare Other | Admitting: Internal Medicine

## 2013-01-25 ENCOUNTER — Encounter: Payer: Self-pay | Admitting: Internal Medicine

## 2013-01-25 ENCOUNTER — Encounter (INDEPENDENT_AMBULATORY_CARE_PROVIDER_SITE_OTHER): Payer: Self-pay

## 2013-01-25 ENCOUNTER — Ambulatory Visit (INDEPENDENT_AMBULATORY_CARE_PROVIDER_SITE_OTHER): Payer: Medicare Other | Admitting: Internal Medicine

## 2013-01-25 VITALS — BP 108/68 | HR 90 | Ht 67.0 in | Wt 162.2 lb

## 2013-01-25 DIAGNOSIS — R5383 Other fatigue: Secondary | ICD-10-CM

## 2013-01-25 DIAGNOSIS — E785 Hyperlipidemia, unspecified: Secondary | ICD-10-CM

## 2013-01-25 DIAGNOSIS — I10 Essential (primary) hypertension: Secondary | ICD-10-CM

## 2013-01-25 DIAGNOSIS — R5381 Other malaise: Secondary | ICD-10-CM

## 2013-01-25 DIAGNOSIS — I4891 Unspecified atrial fibrillation: Secondary | ICD-10-CM

## 2013-01-25 DIAGNOSIS — Z950 Presence of cardiac pacemaker: Secondary | ICD-10-CM

## 2013-01-25 DIAGNOSIS — I442 Atrioventricular block, complete: Secondary | ICD-10-CM

## 2013-01-25 LAB — PACEMAKER DEVICE OBSERVATION
AL IMPEDENCE PM: 666 Ohm
ATRIAL PACING PM: 90
BAMS-0001: 150 {beats}/min
BATTERY VOLTAGE: 2.78 V
RV LEAD IMPEDENCE PM: 527 Ohm

## 2013-01-25 MED ORDER — NEBIVOLOL HCL 5 MG PO TABS: 5.0000 mg | ORAL_TABLET | Freq: Every day | ORAL | Status: DC

## 2013-01-25 NOTE — Progress Notes (Signed)
PCP:  [name], MD  The patient presents today for routine electrophysiology followup.  Since her recent cardioversion, she has maintained sinus rhythm.  She feels better but does continue to have some fatigue.  She wonders if this is due to switch from bystolic to metoprolol for patient preference due to costs last visit.  Today, she denies symptoms of palpitations, chest pain,  orthopnea, PND, lower extremity edema, dizziness, presyncope, syncope, or neurologic sequela.  The patient feels that she is tolerating medications without difficulties and is otherwise without complaint today.   Past Medical History  Diagnosis Date  . Second degree heart block     a. s/p PPM 2005. b. Gen change to Medtronic by Dr Johney Frame  9/11 for premature ERI 11/2009.  Marland Kitchen Hypertension   . Hyperlipidemia   . DJD (degenerative joint disease)   . GERD (gastroesophageal reflux disease)   . CAD (coronary artery disease)     a. Nonobstructive by cath 2005 - 40-50% mid LAD. b. Nonischemic nuc 05/2011.  Marland Kitchen Hypothyroid   . Persistent atrial fibrillation 05/2011  . Biatrial enlargement     severe  . Carotid artery disease     a. 1-50% bilateral (upper end) - March 2011.  . Atrial flutter     a. Noted during pacemaker implantation 2011, spontaneously terminated.   Past Surgical History  Procedure Laterality Date  . Pacemaker insertion  2005, 12/11/09    implanted by Dr Reyes Ivan, generator change 9/11 by Fawn Kirk for premature ERI  . Tonsillectomy    . Colon surgery    . Hemorroidectomy    . Tee without cardioversion  06/11/2011    Procedure: TRANSESOPHAGEAL ECHOCARDIOGRAM (TEE);  Surgeon: Dolores Patty, MD;  Location: [facility] ENDOSCOPY;  Service: Cardiovascular;  Laterality: N/A;  . Cardioversion  06/11/2011    Procedure: CARDIOVERSION;  Surgeon: Dolores Patty, MD;  Location: [facility] ENDOSCOPY;  Service: Cardiovascular;  Laterality: N/A;  . Abdominal surgery      twisted bowel  . Cardioversion N/A 01/04/2013   Procedure: CARDIOVERSION;  Surgeon: Lars Masson, MD;  Location: [facility] ENDOSCOPY;  Service: Cardiovascular;  Laterality: N/A;    Current Outpatient Prescriptions  Medication Sig Dispense Refill  . amLODipine (NORVASC) 5 MG tablet Take 1 tablet (5 mg total) by mouth daily.  30 tablet  3  . atorvastatin (LIPITOR) 40 MG tablet Take 1 tablet (40 mg total) by mouth daily.  30 tablet  3  . fish oil-omega-3 fatty acids 1000 MG capsule Take 2 g by mouth daily.       . furosemide (LASIX) 20 MG tablet Take 1 tablet (20 mg total) by mouth every Monday, Wednesday, and Friday.  30 tablet  0  . Garlic 1000 MG CAPS Take 2 capsules by mouth daily.        Marland Kitchen latanoprost (XALATAN) 0.005 % ophthalmic solution Place 1 drop into both eyes at bedtime.        Marland Kitchen levothyroxine (SYNTHROID, LEVOTHROID) 100 MCG tablet Take 1 tablet once a day for 6 days and then take 1/2 tablet on the 7th day      . Multiple Vitamins-Minerals (CENTRUM SILVER PO) Take 1 tablet by mouth daily.        Marland Kitchen omeprazole (PRILOSEC) 20 MG capsule Take 20 mg by mouth daily.      . potassium chloride SA (KLOR-CON M20) 20 MEQ tablet Take 1 tablet (20 mEq total) by mouth every Monday, Wednesday, and Friday.  30 tablet  0  .  Rivaroxaban (XARELTO) 20 MG TABS tablet Take 1 tablet (20 mg total) by mouth daily with supper.  30 tablet  6  . timolol (BETIMOL) 0.5 % ophthalmic solution Place 1 drop into both eyes every morning.       . timolol (TIMOPTIC) 0.5 % ophthalmic solution 1 drop daily.      . [DISCONTINUED] hydrochlorothiazide (MICROZIDE) 12.5 MG capsule Take 12.5 mg by mouth daily.        . [DISCONTINUED] olmesartan (BENICAR) 40 MG tablet Take 40 mg by mouth daily.         No current facility-administered medications for this visit.    No Known Allergies  History   Social History  . Marital Status: Widowed    Spouse Name: N/A    Number of Children: N/A  . Years of Education: N/A   Occupational History  . Not on file.   Social History  Main Topics  . Smoking status: Never Smoker   . Smokeless tobacco: Not on file  . Alcohol Use: 1.5 oz/week    3 drink(s) per week     Comment: 3/week - mixed drinks  . Drug Use: No  . Sexual Activity: Not on file   Other Topics Concern  . Not on file   Social History Narrative  . No narrative on file    Physical Exam: Filed Vitals:   01/25/13 0855  BP: 108/68  Pulse: 90  Height: 5\' 7"  (1.702 m)  Weight: 162 lb 3.2 oz (73.573 kg)    GEN- The patient is well appearing, alert and oriented x 3 today.   Head- normocephalic, atraumatic Eyes-  Sclera clear, conjunctiva pink Ears- hearing intact Oropharynx- clear Neck- supple, no JVP Lymph- no cervical lymphadenopathy Lungs- Clear to ausculation bilaterally, normal work of breathing Heart- Regular rate and rhythm (paced) GI- soft, NT, ND, + BS Extremities- no clubbing, cyanosis, or edema MS- no significant deformity or atrophy Skin- no rash or lesion Psych- euthymic mood, full affect Neuro- strength and sensation are intact  ekg today reveals AV paced at 90 bpm Device interrogation is reviewed (see paceart)  Assessment and Plan:  1. Persistent afib maintianing sinus rhythm off of AAD therapy Given biatrial enlargement (pt aware), I am concerned that she will almost certainly require AAD at some point (options are flecainide, tikosyn, and amiodarone) Continue long term anticoagulation  2. Complete heart block Normal pacemaker function See Pace Art report No changes today  3. HTN Stable Given fatigue, I will switch back from metoprolol to bystolic  4. HL Last visit we switched crestor to atorvastatin.  She will need to follow-up with Dr Evlyn Kanner for fasting lipids in 6 weeks  5. Hyponatremia Stable No change required today   Return to see me in December  carelink

## 2013-01-25 NOTE — Patient Instructions (Addendum)
Your physician wants you to follow-up in: 12 months with Dr Jacquiline Doe will receive a reminder letter in the mail two months in advance. If you don't receive a letter, please call our office to schedule the follow-up appointment.    Remote monitoring is used to monitor your Pacemaker of ICD from home. This monitoring reduces the number of office visits required to check your device to one time per year. It allows [place] to keep an eye on the functioning of your device to ensure it is working properly. You are scheduled for a device check from home on 04/28/12. You may send your transmission at any time that day. If you have a wireless device, the transmission will be sent automatically. After your physician reviews your transmission, you will receive a postcard with your next transmission date.    Your physician has recommended you make the following change in your medication:  1) stop metoprolol 2) start Bystolic 5mg  daily

## 2013-02-01 ENCOUNTER — Encounter: Payer: Self-pay | Admitting: Internal Medicine

## 2013-02-10 ENCOUNTER — Other Ambulatory Visit: Payer: Self-pay

## 2013-02-10 MED ORDER — POTASSIUM CHLORIDE CRYS ER 20 MEQ PO TBCR: 20.0000 meq | EXTENDED_RELEASE_TABLET | ORAL | Status: DC

## 2013-02-10 MED ORDER — FUROSEMIDE 20 MG PO TABS: 20.0000 mg | ORAL_TABLET | ORAL | Status: DC

## 2013-03-01 ENCOUNTER — Encounter: Payer: Self-pay | Admitting: Internal Medicine

## 2013-03-01 ENCOUNTER — Ambulatory Visit (INDEPENDENT_AMBULATORY_CARE_PROVIDER_SITE_OTHER): Payer: Medicare Other | Admitting: Internal Medicine

## 2013-03-01 ENCOUNTER — Encounter (INDEPENDENT_AMBULATORY_CARE_PROVIDER_SITE_OTHER): Payer: Self-pay

## 2013-03-01 VITALS — BP 134/82 | HR 92 | Ht 66.0 in | Wt 160.2 lb

## 2013-03-01 DIAGNOSIS — Z950 Presence of cardiac pacemaker: Secondary | ICD-10-CM

## 2013-03-01 DIAGNOSIS — I442 Atrioventricular block, complete: Secondary | ICD-10-CM

## 2013-03-01 DIAGNOSIS — I4891 Unspecified atrial fibrillation: Secondary | ICD-10-CM

## 2013-03-01 DIAGNOSIS — E785 Hyperlipidemia, unspecified: Secondary | ICD-10-CM

## 2013-03-01 DIAGNOSIS — I10 Essential (primary) hypertension: Secondary | ICD-10-CM

## 2013-03-01 LAB — MDC_IDC_ENUM_SESS_TYPE_INCLINIC
Battery Remaining Longevity: 99 mo
Battery Voltage: 2.77 V
Brady Statistic AP VP Percent: 89 %
Brady Statistic AP VS Percent: 0 %
Brady Statistic AS VP Percent: 11 %
Lead Channel Impedance Value: 666 Ohm
Lead Channel Pacing Threshold Amplitude: 0.5 V
Lead Channel Pacing Threshold Amplitude: 1 V
Lead Channel Sensing Intrinsic Amplitude: 2.8 mV
Lead Channel Setting Pacing Amplitude: 2 V
Lead Channel Setting Pacing Pulse Width: 0.4 ms
Lead Channel Setting Sensing Sensitivity: 2.8 mV

## 2013-03-01 NOTE — Progress Notes (Signed)
PCP:  [name], MD  The patient presents today for routine electrophysiology followup.  Since her last visit, she has maintained sinus rhythm.  Her fatigue is improved with switching of metoprolol back to bystolic.  Today, she denies symptoms of palpitations, chest pain,  orthopnea, PND, lower extremity edema, dizziness, presyncope, syncope, or neurologic sequela.  The patient feels that she is tolerating medications without difficulties and is otherwise without complaint today.   Past Medical History  Diagnosis Date  . Second degree heart block     a. s/p PPM 2005. b. Gen change to [employer]tr[employer] by Dr Johney Frame  9/11 for premature ERI 11/2009.  Beth Santiago Hypertension   . Hyperlipidemia   . DJD (degenerative joint disease)   . GERD (gastroesophageal reflux disease)   . CAD (coronary artery disease)     a. Nonobstructive by cath 2005 - 40-50% mid LAD. b. Nonischemic nuc 05/2011.  Beth Santiago Hypothyroid   . Persistent atrial fibrillation 05/2011  . Biatrial enlargement     severe  . Carotid artery disease     a. 1-50% bilateral (upper end) - March 2011.  . Atrial flutter     a. Noted during pacemaker implantation 2011, spontaneously terminated.   Past Surgical History  Procedure Laterality Date  . Pacemaker insertion  2005, 12/11/09    implanted by Dr Reyes Ivan, generator change 9/11 by Fawn Kirk for premature ERI  . Tonsillectomy    . Colon surgery    . Hemorroidectomy    . Tee without cardioversion  06/11/2011    Procedure: TRANSESOPHAGEAL ECHOCARDIOGRAM (TEE);  Surgeon: Dolores Patty, MD;  Location: [facility] ENDOSCOPY;  Service: Cardiovascular;  Laterality: N/A;  . Cardioversion  06/11/2011    Procedure: CARDIOVERSION;  Surgeon: Dolores Patty, MD;  Location: [facility] ENDOSCOPY;  Service: Cardiovascular;  Laterality: N/A;  . Abdominal surgery      twisted bowel  . Cardioversion N/A 01/04/2013    Procedure: CARDIOVERSION;  Surgeon: Lars Masson, MD;  Location: [facility] ENDOSCOPY;  Service: Cardiovascular;   Laterality: N/A;    Current Outpatient Prescriptions  Medication Sig Dispense Refill  . amLODipine (NORVASC) 5 MG tablet Take 1 tablet (5 mg total) by mouth daily.  30 tablet  3  . atorvastatin (LIPITOR) 40 MG tablet Take 1 tablet (40 mg total) by mouth daily.  30 tablet  3  . fish oil-omega-3 fatty acids 1000 MG capsule Take 2 g by mouth daily.       . furosemide (LASIX) 20 MG tablet Take 1 tablet (20 mg total) by mouth every Monday, Wednesday, and Friday.  30 tablet  3  . Garlic 1000 MG CAPS Take 2 capsules by mouth daily.        Beth Santiago latanoprost (XALATAN) 0.005 % ophthalmic solution Place 1 drop into both eyes at bedtime.        Beth Santiago levothyroxine (SYNTHROID, LEVOTHROID) 100 MCG tablet Take 1 tablet once a day for 6 days and then take 1/2 tablet on the 7th day      . Multiple Vitamins-Minerals (CENTRUM SILVER PO) Take 1 tablet by mouth daily.        . nebivolol (BYSTOLIC) 5 MG tablet Take 1 tablet (5 mg total) by mouth daily.  90 tablet  3  . omeprazole (PRILOSEC) 20 MG capsule Take 20 mg by mouth daily.      . potassium chloride SA (KLOR-CON M20) 20 MEQ tablet Take 1 tablet (20 mEq total) by mouth every Monday, Wednesday, and Friday.  30 tablet  3  . Rivaroxaban (XARELTO) 20 MG TABS tablet Take 1 tablet (20 mg total) by mouth daily with supper.  30 tablet  6  . timolol (BETIMOL) 0.5 % ophthalmic solution Place 1 drop into both eyes every morning.       . timolol (TIMOPTIC) 0.5 % ophthalmic solution 1 drop daily.      . [DISCONTINUED] hydrochlorothiazide (MICROZIDE) 12.5 MG capsule Take 12.5 mg by mouth daily.        . [DISCONTINUED] olmesartan (BENICAR) 40 MG tablet Take 40 mg by mouth daily.         No current facility-administered medications for this visit.    No Known Allergies  History   Social History  . Marital Status: Widowed    Spouse Name: N/A    Number of Children: N/A  . Years of Education: N/A   Occupational History  . Not on file.   Social History Main Topics  .  Smoking status: Never Smoker   . Smokeless tobacco: Not on file  . Alcohol Use: 1.5 oz/week    3 drink(s) per week     Comment: 3/week - mixed drinks  . Drug Use: No  . Sexual Activity: Not on file   Other Topics Concern  . Not on file   Social History Narrative  . No narrative on file    Physical Exam: Filed Vitals:   03/01/13 1005  BP: 134/82  Pulse: 92  Height: 5\' 6"  (1.676 m)  Weight: 160 lb 4 oz (72.689 kg)    GEN- The patient is well appearing, alert and oriented x 3 today.   Head- normocephalic, atraumatic Eyes-  Sclera clear, conjunctiva pink Ears- hearing intact Oropharynx- clear Neck- supple, no JVP Lymph- no cervical lymphadenopathy Lungs- Clear to ausculation bilaterally, normal work of breathing Heart- Regular rate and rhythm (paced) GI- soft, NT, ND, + BS Extremities- no clubbing, cyanosis, or edema MS- no significant deformity or atrophy Skin- no rash or lesion Psych- euthymic mood, full affect Neuro- strength and sensation are intact  Device interrogation is reviewed (see paceart)  Assessment and Plan:  1. Persistent afib maintianing sinus rhythm off of AAD therapy Given biatrial enlargement (pt aware), I am concerned that she will almost certainly require AAD at some point (options are flecainide, tikosyn, and amiodarone) Continue long term anticoagulation  2. Complete heart block Normal pacemaker function See Pace Art report No changes today  3. HTN Stable  4. HL Last visit we switched crestor to atorvastatin.  She will need to follow-up with Dr Evlyn Kanner for fasting lipids.   carelink in 3 months I will see again in 6 months

## 2013-03-01 NOTE — Patient Instructions (Signed)
Your physician wants you to follow-up in: 6 months with Dr Jacquiline Doe will receive a reminder letter in the mail two months in advance. If you don't receive a letter, please call our office to schedule the follow-up appointment.    Remote monitoring is used to monitor your Pacemaker or ICD from home. This monitoring reduces the number of office visits required to check your device to one time per year. It allows [place] to keep an eye on the functioning of your device to ensure it is working properly. You are scheduled for a device check from home on 06/02/13. You may send your transmission at any time that day. If you have a wireless device, the transmission will be sent automatically. After your physician reviews your transmission, you will receive a postcard with your next transmission date.

## 2013-04-13 ENCOUNTER — Other Ambulatory Visit: Payer: Self-pay

## 2013-04-13 DIAGNOSIS — I4891 Unspecified atrial fibrillation: Secondary | ICD-10-CM

## 2013-04-13 MED ORDER — AMLODIPINE BESYLATE 5 MG PO TABS: 5.0000 mg | ORAL_TABLET | Freq: Every day | ORAL | Status: DC

## 2013-05-10 ENCOUNTER — Ambulatory Visit (INDEPENDENT_AMBULATORY_CARE_PROVIDER_SITE_OTHER): Payer: Medicare Other

## 2013-05-10 ENCOUNTER — Encounter: Payer: Self-pay | Admitting: Internal Medicine

## 2013-05-10 ENCOUNTER — Ambulatory Visit (INDEPENDENT_AMBULATORY_CARE_PROVIDER_SITE_OTHER): Payer: Medicare Other | Admitting: Internal Medicine

## 2013-05-10 DIAGNOSIS — I10 Essential (primary) hypertension: Secondary | ICD-10-CM

## 2013-05-10 DIAGNOSIS — Z5181 Encounter for therapeutic drug level monitoring: Secondary | ICD-10-CM

## 2013-05-10 DIAGNOSIS — I442 Atrioventricular block, complete: Secondary | ICD-10-CM

## 2013-05-10 DIAGNOSIS — I4891 Unspecified atrial fibrillation: Secondary | ICD-10-CM

## 2013-05-10 LAB — BASIC METABOLIC PANEL
BUN: 10 mg/dL (ref 6–23)
CHLORIDE: 100 meq/L (ref 96–112)
CO2: 26 mEq/L (ref 19–32)
Calcium: 9.6 mg/dL (ref 8.4–10.5)
Creatinine, Ser: 0.6 mg/dL (ref 0.4–1.2)
GFR: 96.91 mL/min (ref >60.00)
GLUCOSE: 99 mg/dL (ref 70–99)
POTASSIUM: 4.1 meq/L (ref 3.5–5.1)
Sodium: 134 mEq/L — ABNORMAL LOW (ref 135–145)

## 2013-05-10 LAB — CBC
HCT: 43 % (ref 36.0–46.0)
Hemoglobin: 14.1 g/dL (ref 12.0–15.0)
MCHC: 32.8 g/dL (ref 30.0–36.0)
MCV: 95.9 fl (ref 78.0–100.0)
PLATELETS: 318 10*3/uL (ref 150.0–400.0)
RBC: 4.48 Mil/uL (ref 3.87–5.11)
RDW: 14.4 % (ref 11.5–14.6)
WBC: 7.4 10*3/uL (ref 4.5–10.5)

## 2013-05-10 MED ORDER — FLECAINIDE ACETATE 50 MG PO TABS: 50.0000 mg | ORAL_TABLET | Freq: Two times a day (BID) | ORAL | Status: DC

## 2013-05-10 NOTE — Patient Instructions (Signed)

## 2013-05-10 NOTE — Patient Instructions (Signed)
Your physician recommends that you schedule a follow-up appointment as scheduled   Your physician has recommended you make the following change in your medication:  1) Start Flecainide 50mg  twice daily

## 2013-05-10 NOTE — Progress Notes (Signed)
PCP:  [name], MD  The patient presents today for routine electrophysiology followup. She presents today for an urgent add on appointment.  Over the past 2 weeks, her energy has decreased.  She feels like she is back in afib.  Today, she denies symptoms of palpitations, chest pain,  orthopnea, PND, lower extremity edema, dizziness, presyncope, syncope, or neurologic sequela.  The patient feels that she is tolerating medications without difficulties and is otherwise without complaint today.   Past Medical History  Diagnosis Date  . Second degree heart block     a. s/p PPM 2005. b. Gen change to Medtronic by Dr Johney Frame  9/11 for premature ERI 11/2009.  Marland Kitchen Hypertension   . Hyperlipidemia   . DJD (degenerative joint disease)   . GERD (gastroesophageal reflux disease)   . CAD (coronary artery disease)     a. Nonobstructive by cath 2005 - 40-50% mid LAD. b. Nonischemic nuc 05/2011.  Marland Kitchen Hypothyroid   . Persistent atrial fibrillation 05/2011  . Biatrial enlargement     severe  . Carotid artery disease     a. 1-50% bilateral (upper end) - March 2011.  . Atrial flutter     a. Noted during pacemaker implantation 2011, spontaneously terminated.   Past Surgical History  Procedure Laterality Date  . Pacemaker insertion  2005, 12/11/09    implanted by Dr Reyes Ivan, generator change 9/11 by Fawn Kirk for premature ERI  . Tonsillectomy    . Colon surgery    . Hemorroidectomy    . Tee without cardioversion  06/11/2011    Procedure: TRANSESOPHAGEAL ECHOCARDIOGRAM (TEE);  Surgeon: Dolores Patty, MD;  Location: [facility] ENDOSCOPY;  Service: Cardiovascular;  Laterality: N/A;  . Cardioversion  06/11/2011    Procedure: CARDIOVERSION;  Surgeon: Dolores Patty, MD;  Location: [facility] ENDOSCOPY;  Service: Cardiovascular;  Laterality: N/A;  . Abdominal surgery      twisted bowel  . Cardioversion N/A 01/04/2013    Procedure: CARDIOVERSION;  Surgeon: Lars Masson, MD;  Location: [facility] ENDOSCOPY;  Service:  Cardiovascular;  Laterality: N/A;    Current Outpatient Prescriptions  Medication Sig Dispense Refill  . amLODipine (NORVASC) 5 MG tablet Take 1 tablet (5 mg total) by mouth daily.  30 tablet  3  . atorvastatin (LIPITOR) 40 MG tablet Take 1 tablet (40 mg total) by mouth daily.  30 tablet  3  . fish oil-omega-3 fatty acids 1000 MG capsule Take 2 g by mouth daily.       . flecainide (TAMBOCOR) 50 MG tablet Take 1 tablet (50 mg total) by mouth 2 (two) times daily.  180 tablet  3  . furosemide (LASIX) 20 MG tablet Take 1 tablet (20 mg total) by mouth every Monday, Wednesday, and Friday.  30 tablet  3  . Garlic 1000 MG CAPS Take 2 capsules by mouth daily.        Marland Kitchen latanoprost (XALATAN) 0.005 % ophthalmic solution Place 1 drop into both eyes at bedtime.        Marland Kitchen levothyroxine (SYNTHROID, LEVOTHROID) 100 MCG tablet Take 1 tablet once a day for 6 days and then take 1/2 tablet on the 7th day      . Multiple Vitamins-Minerals (CENTRUM SILVER PO) Take 1 tablet by mouth daily.        . nebivolol (BYSTOLIC) 5 MG tablet Take 1 tablet (5 mg total) by mouth daily.  90 tablet  3  . omeprazole (PRILOSEC) 20 MG capsule Take 20 mg by mouth daily.      Marland Kitchen  potassium chloride SA (KLOR-CON M20) 20 MEQ tablet Take 1 tablet (20 mEq total) by mouth every Monday, Wednesday, and Friday.  30 tablet  3  . Rivaroxaban (XARELTO) 20 MG TABS tablet Take 1 tablet (20 mg total) by mouth daily with supper.  30 tablet  6  . timolol (BETIMOL) 0.5 % ophthalmic solution Place 1 drop into both eyes every morning.       . timolol (TIMOPTIC) 0.5 % ophthalmic solution 1 drop daily.      . [DISCONTINUED] hydrochlorothiazide (MICROZIDE) 12.5 MG capsule Take 12.5 mg by mouth daily.        . [DISCONTINUED] olmesartan (BENICAR) 40 MG tablet Take 40 mg by mouth daily.         No current facility-administered medications for this visit.    No Known Allergies  History   Social History  . Marital Status: Widowed    Spouse Name: N/A     Number of Children: N/A  . Years of Education: N/A   Occupational History  . Not on file.   Social History Main Topics  . Smoking status: Never Smoker   . Smokeless tobacco: Not on file  . Alcohol Use: 1.5 oz/week    3 drink(s) per week     Comment: 3/week - mixed drinks  . Drug Use: No  . Sexual Activity: Not on file   Other Topics Concern  . Not on file   Social History Narrative  . No narrative on file    Physical Exam: There were no vitals filed for this visit.  GEN- The patient is well appearing, alert and oriented x 3 today.   Head- normocephalic, atraumatic Eyes-  Sclera clear, conjunctiva pink Ears- hearing intact Oropharynx- clear Neck- supple, no JVP Lymph- no cervical lymphadenopathy Lungs- Clear to ausculation bilaterally, normal work of breathing Heart- Regular rate and rhythm (paced) GI- soft, NT, ND, + BS Extremities- no clubbing, cyanosis, or edema MS- no significant deformity or atrophy Skin- no rash or lesion Psych- euthymic mood, full affect Neuro- strength and sensation are intact  Device interrogation is reviewed (see paceart)  Assessment and Plan:  1. Persistent afib She has been back in afib since 05/03/13.   Given biatrial enlargement (pt aware), I am concerned that she will hav efurther afib.   Therapeutic strategies for afib including flecainide, tikosyn, and amiodarone were discussed in detail with the patient today. Risk, benefits, and alternatives to each medicine was discussed.  At this time, she would favor flecainide.  She does not have obstructive CAD.  I will therefore start flecainide 50mg  BID.  She should return in 7-10 days.  IF she remains in afib then we should increase flecainide to 100mg  BID.  I will see her back in four weeks.  IF she is still in afib then we will proceed with cardioversion. Continue long term anticoagulation  2. Complete heart block Normal pacemaker function See Pace Art report No changes today  3.  HTN Stable  Return in 4 weeks

## 2013-05-10 NOTE — Progress Notes (Signed)
Pt was started on Xarelto 20mg  QD by Dr Johney FrameAllred for afib on 05/2011.    Reviewed patients medication list.  Pt is not currently on any combined P-gp and strong CYP3A4 inhibitors/inducers (ketoconazole, traconazole, ritonavir, carbamazepine, phenytoin, rifampin, St. John's wort).  Reviewed labs.  SCr-0.6 on 05/10/13 , Weight-72.69 kg, CrCl-77.23 .  Dose appropriate based on CrCl.   Hgb and HCT 14.1/43.0 on 05/10/13 Within Normal Limits.

## 2013-05-17 ENCOUNTER — Ambulatory Visit (INDEPENDENT_AMBULATORY_CARE_PROVIDER_SITE_OTHER): Payer: Medicare Other | Admitting: *Deleted

## 2013-05-17 ENCOUNTER — Encounter: Payer: Self-pay | Admitting: Internal Medicine

## 2013-05-17 DIAGNOSIS — I4891 Unspecified atrial fibrillation: Secondary | ICD-10-CM

## 2013-05-17 DIAGNOSIS — I442 Atrioventricular block, complete: Secondary | ICD-10-CM

## 2013-05-17 LAB — MDC_IDC_ENUM_SESS_TYPE_INCLINIC

## 2013-05-17 NOTE — Progress Notes (Signed)
Interrogation only today for underlying rhythm and A-fib burden.  43.1% A-fib since last interrogation in December.  ROV in May with Dr. Johney FrameAllred.

## 2013-05-19 ENCOUNTER — Encounter: Payer: Self-pay | Admitting: Internal Medicine

## 2013-05-22 ENCOUNTER — Encounter: Payer: Self-pay | Admitting: Internal Medicine

## 2013-05-31 ENCOUNTER — Encounter: Payer: Self-pay | Admitting: Internal Medicine

## 2013-06-12 ENCOUNTER — Encounter: Payer: Medicare Other | Admitting: Internal Medicine

## 2013-08-04 ENCOUNTER — Other Ambulatory Visit: Payer: Self-pay

## 2013-08-04 MED ORDER — RIVAROXABAN 20 MG PO TABS: 20.0000 mg | ORAL_TABLET | Freq: Every day | ORAL | Status: DC

## 2013-08-07 ENCOUNTER — Telehealth: Payer: Self-pay | Admitting: Internal Medicine

## 2013-08-07 NOTE — Telephone Encounter (Signed)
New message      Drug store does not carry flecainide 50mg -----[facility] carries the medication but it is not convenient for the patient to go there.  [facility]

## 2013-08-08 NOTE — Telephone Encounter (Signed)
Spoke with patient and she is going to discuss with Dr Johney FrameAllred at her office visit on 08/16/13 in regards to changing medications

## 2013-08-15 ENCOUNTER — Other Ambulatory Visit: Payer: Self-pay

## 2013-08-16 ENCOUNTER — Encounter: Payer: Self-pay | Admitting: Internal Medicine

## 2013-08-16 ENCOUNTER — Ambulatory Visit (INDEPENDENT_AMBULATORY_CARE_PROVIDER_SITE_OTHER): Payer: Medicare Other | Admitting: Internal Medicine

## 2013-08-16 VITALS — BP 125/69 | HR 75 | Ht 67.0 in | Wt 158.2 lb

## 2013-08-16 DIAGNOSIS — I4891 Unspecified atrial fibrillation: Secondary | ICD-10-CM

## 2013-08-16 DIAGNOSIS — I442 Atrioventricular block, complete: Secondary | ICD-10-CM

## 2013-08-16 DIAGNOSIS — I10 Essential (primary) hypertension: Secondary | ICD-10-CM

## 2013-08-16 LAB — MDC_IDC_ENUM_SESS_TYPE_INCLINIC
Battery Impedance: 306 Ohm
Battery Remaining Longevity: 84 mo
Battery Voltage: 2.78 V
Brady Statistic AP VP Percent: 99 %
Brady Statistic AP VS Percent: 0 %
Brady Statistic AS VP Percent: 1 %
Date Time Interrogation Session: 20150527112725
Lead Channel Impedance Value: 644 Ohm
Lead Channel Pacing Threshold Amplitude: 0.5 V
Lead Channel Pacing Threshold Amplitude: 1.25 V
Lead Channel Pacing Threshold Pulse Width: 0.4 ms
Lead Channel Sensing Intrinsic Amplitude: 4 mV
Lead Channel Setting Pacing Pulse Width: 0.4 ms
MDC IDC MSMT LEADCHNL RA PACING THRESHOLD PULSEWIDTH: 0.4 ms
MDC IDC MSMT LEADCHNL RV IMPEDANCE VALUE: 498 Ohm
MDC IDC SET LEADCHNL RA PACING AMPLITUDE: 2 V
MDC IDC SET LEADCHNL RV PACING AMPLITUDE: 2.5 V
MDC IDC SET LEADCHNL RV SENSING SENSITIVITY: 2.8 mV
MDC IDC STAT BRADY AS VS PERCENT: 0 %

## 2013-08-16 NOTE — Progress Notes (Signed)
PCP:  Julian HySOUTH,[name] ALAN, MD  The patient presents today for routine electrophysiology followup. She is maintaining sinus rhythm at this time.  Her sister is with her today.  She has noticed some difficulty with memory recall.  Today, she denies symptoms of palpitations, chest pain,  orthopnea, PND, lower extremity edema, dizziness, presyncope, syncope, or neurologic sequela.  The patient feels that she is tolerating medications without difficulties and is otherwise without complaint today.   Past Medical History  Diagnosis Date  . Second degree heart block     a. s/p PPM 2005. b. Gen change to Medtronic by Dr Johney FrameAllred  9/11 for premature ERI 11/2009.  Marland Kitchen. Hypertension   . Hyperlipidemia   . DJD (degenerative joint disease)   . GERD (gastroesophageal reflux disease)   . CAD (coronary artery disease)     a. Nonobstructive by cath 2005 - 40-50% mid LAD. b. Nonischemic nuc 05/2011.  Marland Kitchen. Hypothyroid   . Persistent atrial fibrillation 05/2011  . Biatrial enlargement     severe  . Carotid artery disease     a. 1-50% bilateral (upper end) - March 2011.  . Atrial flutter     a. Noted during pacemaker implantation 2011, spontaneously terminated.   Past Surgical History  Procedure Laterality Date  . Pacemaker insertion  2005, 12/11/09    implanted by Dr Reyes IvanKersey, generator change 9/11 by Fawn KirkJA for premature ERI  . Tonsillectomy    . Colon surgery    . Hemorroidectomy    . Tee without cardioversion  06/11/2011    Procedure: TRANSESOPHAGEAL ECHOCARDIOGRAM (TEE);  Surgeon: Dolores Pattyaniel R Bensimhon, MD;  [facility];  Service: Cardiovascular;  Laterality: N/A;  . Cardioversion  06/11/2011    Procedure: CARDIOVERSION;  Surgeon: Dolores Pattyaniel R Bensimhon, MD;  [facility];  Service: Cardiovascular;  Laterality: N/A;  . Abdominal surgery      twisted bowel  . Cardioversion N/A 01/04/2013    Procedure: CARDIOVERSION;  Surgeon: Lars MassonKatarina H Nelson, MD;  [facility];  Service: Cardiovascular;   Laterality: N/A;    Current Outpatient Prescriptions  Medication Sig Dispense Refill  . amLODipine (NORVASC) 5 MG tablet Take 1 tablet (5 mg total) by mouth daily.  30 tablet  3  . atorvastatin (LIPITOR) 40 MG tablet Take 1 tablet (40 mg total) by mouth daily.  30 tablet  3  . CRESTOR 20 MG tablet Take 1 tablet by mouth at bedtime.      . fish oil-omega-3 fatty acids 1000 MG capsule Take 2 g by mouth daily.       . flecainide (TAMBOCOR) 50 MG tablet Take 1 tablet (50 mg total) by mouth 2 (two) times daily.  180 tablet  3  . furosemide (LASIX) 20 MG tablet Take 1 tablet (20 mg total) by mouth every Monday, Wednesday, and Friday.  30 tablet  3  . Garlic 1000 MG CAPS Take 2 capsules by mouth daily.        Marland Kitchen. latanoprost (XALATAN) 0.005 % ophthalmic solution Place 1 drop into both eyes at bedtime.        Marland Kitchen. levothyroxine (SYNTHROID, LEVOTHROID) 100 MCG tablet Take 1 tablet once a day for 6 days and then take 1/2 tablet on the 7th day      . Multiple Vitamins-Minerals (CENTRUM SILVER PO) Take 1 tablet by mouth daily.        . nebivolol (BYSTOLIC) 5 MG tablet Take 1 tablet (5 mg total) by mouth daily.  90 tablet  3  .  omeprazole (PRILOSEC) 20 MG capsule Take 20 mg by mouth daily.      . potassium chloride SA (KLOR-CON M20) 20 MEQ tablet Take 1 tablet (20 mEq total) by mouth every Monday, Wednesday, and Friday.  30 tablet  3  . rivaroxaban (XARELTO) 20 MG TABS tablet Take 1 tablet (20 mg total) by mouth daily with supper.  30 tablet  6  . timolol (BETIMOL) 0.5 % ophthalmic solution Place 1 drop into both eyes every morning.       . timolol (TIMOPTIC) 0.5 % ophthalmic solution 1 drop daily.      . [DISCONTINUED] hydrochlorothiazide (MICROZIDE) 12.5 MG capsule Take 12.5 mg by mouth daily.        . [DISCONTINUED] olmesartan (BENICAR) 40 MG tablet Take 40 mg by mouth daily.         No current facility-administered medications for this visit.    No Known Allergies  History   Social History  .  Marital Status: Widowed    Spouse Name: N/A    Number of Children: N/A  . Years of Education: N/A   Occupational History  . Not on file.   Social History Main Topics  . Smoking status: Never Smoker   . Smokeless tobacco: Not on file  . Alcohol Use: 1.5 oz/week    3 drink(s) per week     Comment: 3/week - mixed drinks  . Drug Use: No  . Sexual Activity: Not on file   Other Topics Concern  . Not on file   Social History Narrative  . No narrative on file    Physical Exam: Filed Vitals:   08/16/13 1013  BP: 125/69  Pulse: 75  Height: 5\' 7"  (1.702 m)  Weight: 158 lb 3.2 oz (71.759 kg)    GEN- The patient is well appearing, alert and oriented x 3 today.   Head- normocephalic, atraumatic Eyes-  Sclera clear, conjunctiva pink Ears- hearing intact Oropharynx- clear Neck- supple, no JVP Lymph- no cervical lymphadenopathy Lungs- Clear to ausculation bilaterally, normal work of breathing Heart- Regular rate and rhythm (paced) GI- soft, NT, ND, + BS Extremities- no clubbing, cyanosis, or edema Neuro- strength and sensation are intact  Device interrogation is reviewed (see paceart)  Assessment and Plan:  1. Persistent afib Maintaining sinus with low dose flecainide Continue long term anticoagulation  2. Complete heart block Normal pacemaker function See Pace Art report No changes today  3. HTN Stable  Will enroll in remote monitoring Return to see me in 6 months Will likely enroll in our afib clinic at that time

## 2013-08-16 NOTE — Patient Instructions (Addendum)
Remote monitoring is used to monitor your pacemaker from home. This monitoring reduces the number of office visits required to check your device to one time per year. It allows [place] to keep an eye on the functioning of your device to ensure it is working properly. You are scheduled for a device check from home on 11-21-2013. You may send your transmission at any time that day. If you have a wireless device, the transmission will be sent automatically. After your physician reviews your transmission, you will receive a postcard with your next transmission date.  Your physician recommends that you schedule a follow-up appointment in: 6 months with Dr.Allred

## 2013-09-06 ENCOUNTER — Other Ambulatory Visit: Payer: Self-pay | Admitting: Internal Medicine

## 2013-09-07 ENCOUNTER — Other Ambulatory Visit: Payer: Self-pay | Admitting: *Deleted

## 2013-09-07 MED ORDER — FUROSEMIDE 20 MG PO TABS: 20.0000 mg | ORAL_TABLET | ORAL | Status: DC

## 2013-11-16 ENCOUNTER — Other Ambulatory Visit: Payer: Self-pay

## 2013-11-16 MED ORDER — POTASSIUM CHLORIDE CRYS ER 20 MEQ PO TBCR: 20.0000 meq | EXTENDED_RELEASE_TABLET | ORAL | Status: DC

## 2013-11-21 ENCOUNTER — Ambulatory Visit (INDEPENDENT_AMBULATORY_CARE_PROVIDER_SITE_OTHER): Payer: Medicare Other | Admitting: *Deleted

## 2013-11-21 ENCOUNTER — Encounter: Payer: Self-pay | Admitting: Internal Medicine

## 2013-11-21 DIAGNOSIS — I4891 Unspecified atrial fibrillation: Secondary | ICD-10-CM

## 2013-11-21 NOTE — Progress Notes (Signed)
Remote pacemaker transmission.   

## 2013-11-22 LAB — MDC_IDC_ENUM_SESS_TYPE_REMOTE
Battery Impedance: 379 Ohm
Battery Remaining Longevity: 85 mo
Battery Voltage: 2.78 V
Brady Statistic AP VP Percent: 99 %
Lead Channel Impedance Value: 555 Ohm
Lead Channel Pacing Threshold Amplitude: 0.5 V
Lead Channel Pacing Threshold Amplitude: 1.25 V
Lead Channel Pacing Threshold Pulse Width: 0.4 ms
Lead Channel Setting Pacing Amplitude: 2 V
Lead Channel Setting Pacing Amplitude: 2.5 V
Lead Channel Setting Pacing Pulse Width: 0.4 ms
Lead Channel Setting Sensing Sensitivity: 2.8 mV
MDC IDC MSMT LEADCHNL RA IMPEDANCE VALUE: 678 Ohm
MDC IDC MSMT LEADCHNL RA PACING THRESHOLD PULSEWIDTH: 0.4 ms
MDC IDC SESS DTM: 20150901112110
MDC IDC STAT BRADY AP VS PERCENT: 0 %
MDC IDC STAT BRADY AS VP PERCENT: 1 %
MDC IDC STAT BRADY AS VS PERCENT: 0 %

## 2013-11-29 ENCOUNTER — Encounter: Payer: Self-pay | Admitting: Cardiology

## 2014-01-08 ENCOUNTER — Other Ambulatory Visit: Payer: Self-pay | Admitting: Internal Medicine

## 2014-02-12 ENCOUNTER — Other Ambulatory Visit: Payer: Self-pay

## 2014-03-05 ENCOUNTER — Other Ambulatory Visit: Payer: Self-pay | Admitting: Internal Medicine

## 2014-03-20 ENCOUNTER — Encounter: Payer: Self-pay | Admitting: *Deleted

## 2014-03-26 ENCOUNTER — Encounter: Payer: Self-pay | Admitting: Internal Medicine

## 2014-03-26 ENCOUNTER — Ambulatory Visit (INDEPENDENT_AMBULATORY_CARE_PROVIDER_SITE_OTHER): Payer: Medicare Other | Admitting: Internal Medicine

## 2014-03-26 VITALS — BP 130/80 | HR 87 | Ht 67.0 in | Wt 157.0 lb

## 2014-03-26 DIAGNOSIS — R5383 Other fatigue: Secondary | ICD-10-CM

## 2014-03-26 DIAGNOSIS — Z950 Presence of cardiac pacemaker: Secondary | ICD-10-CM

## 2014-03-26 DIAGNOSIS — R42 Dizziness and giddiness: Secondary | ICD-10-CM | POA: Insufficient documentation

## 2014-03-26 DIAGNOSIS — I4891 Unspecified atrial fibrillation: Secondary | ICD-10-CM

## 2014-03-26 DIAGNOSIS — I442 Atrioventricular block, complete: Secondary | ICD-10-CM

## 2014-03-26 DIAGNOSIS — I10 Essential (primary) hypertension: Secondary | ICD-10-CM

## 2014-03-26 LAB — MDC_IDC_ENUM_SESS_TYPE_INCLINIC
Battery Impedance: 453 Ohm
Battery Voltage: 2.78 V
Brady Statistic AP VS Percent: 0 %
Brady Statistic AS VP Percent: 1 %
Brady Statistic AS VS Percent: 0 %
Lead Channel Impedance Value: 527 Ohm
Lead Channel Pacing Threshold Amplitude: 0.75 V
Lead Channel Pacing Threshold Amplitude: 0.75 V
Lead Channel Pacing Threshold Pulse Width: 0.4 ms
Lead Channel Pacing Threshold Pulse Width: 0.4 ms
Lead Channel Sensing Intrinsic Amplitude: 4 mV
Lead Channel Setting Pacing Amplitude: 2.5 V
Lead Channel Setting Pacing Pulse Width: 0.4 ms
Lead Channel Setting Sensing Sensitivity: 2.8 mV
MDC IDC MSMT BATTERY REMAINING LONGEVITY: 72 mo
MDC IDC MSMT LEADCHNL RA IMPEDANCE VALUE: 644 Ohm
MDC IDC SESS DTM: 20160104114558
MDC IDC SET LEADCHNL RA PACING AMPLITUDE: 2 V
MDC IDC STAT BRADY AP VP PERCENT: 99 %

## 2014-03-26 NOTE — Progress Notes (Signed)
PCP:  [name], MD  The patient presents today for routine electrophysiology followup. She is maintaining sinus rhythm at this time.  Her sister is with her today.  She has early morning fatigue and also occasional dizziness.  She seems mostly unaware of afib.  Her memory is about the same.  Today, she denies symptoms of palpitations, chest pain,  orthopnea, PND, lower extremity edema, dizziness, presyncope, syncope, or neurologic sequela.  The patient feels that she is tolerating medications without difficulties and is otherwise without complaint today.   Past Medical History  Diagnosis Date  . Second degree heart block     a. s/p PPM 2005. b. Gen change to Medtronic by Dr Johney Frame  9/11 for premature ERI 11/2009.  Marland Kitchen Hypertension   . Hyperlipidemia   . DJD (degenerative joint disease)   . GERD (gastroesophageal reflux disease)   . CAD (coronary artery disease)     a. Nonobstructive by cath 2005 - 40-50% mid LAD. b. Nonischemic nuc 05/2011.  Marland Kitchen Hypothyroid   . Persistent atrial fibrillation 05/2011  . Biatrial enlargement     severe  . Carotid artery disease     a. 1-50% bilateral (upper end) - March 2011.  . Atrial flutter     a. Noted during pacemaker implantation 2011, spontaneously terminated.   Past Surgical History  Procedure Laterality Date  . Pacemaker insertion  2005, 12/11/09    implanted by Dr Reyes Ivan, generator change 9/11 by Fawn Kirk for premature ERI  . Tonsillectomy    . Colon surgery    . Hemorroidectomy    . Tee without cardioversion  06/11/2011    Procedure: TRANSESOPHAGEAL ECHOCARDIOGRAM (TEE);  Surgeon: Dolores Patty, MD;  Location[facility] ENDOSCOPY;  Service: Cardiovascular;  Laterality: N/A;  . Cardioversion  06/11/2011    Procedure: CARDIOVERSION;  Surgeon: Dolores Patty, MD;  Location: [facility] ENDOSCOPY;  Service: Cardiovascular;  Laterality: N/A;  . Abdominal surgery      twisted bowel  . Cardioversion N/A 01/04/2013    Procedure: CARDIOVERSION;  Surgeon:  Lars Masson, MD;  Location: [facility] ENDOSCOPY;  Service: Cardiovascular;  Laterality: N/A;    Current Outpatient Prescriptions  Medication Sig Dispense Refill  . amLODipine (NORVASC) 5 MG tablet TAKE 1 TABLET BY MOUTH DAILY 30 tablet 0  . CRESTOR 20 MG tablet Take 1 tablet by mouth at bedtime.    . fish oil-omega-3 fatty acids 1000 MG capsule Take 2 g by mouth daily.     . flecainide (TAMBOCOR) 50 MG tablet Take 1 tablet (50 mg total) by mouth 2 (two) times daily. 180 tablet 3  . furosemide (LASIX) 20 MG tablet Take 20 mg by mouth daily as needed (swelling).    . Garlic 1000 MG CAPS Take 2 capsules by mouth daily.      Marland Kitchen latanoprost (XALATAN) 0.005 % ophthalmic solution Place 1 drop into both eyes at bedtime.      Marland Kitchen levothyroxine (SYNTHROID, LEVOTHROID) 100 MCG tablet 88 mcg. Take 1 tablet by mouth once a day for 6 days and then take 1/2 tablet by mouth on the 7th day    . levothyroxine (SYNTHROID, LEVOTHROID) 88 MCG tablet     . Multiple Vitamins-Minerals (CENTRUM SILVER PO) Take 1 tablet by mouth daily.      . nebivolol (BYSTOLIC) 5 MG tablet Take 2.5 mg by mouth daily.    Marland Kitchen omeprazole (PRILOSEC) 20 MG capsule Take 20 mg by mouth daily.    . potassium chloride SA (KLOR-CON  M20) 20 MEQ tablet Take 1 tablet (20 mEq total) by mouth every Monday, Wednesday, and Friday. 30 tablet 3  . rivaroxaban (XARELTO) 20 MG TABS tablet Take 1 tablet (20 mg total) by mouth daily with supper. 30 tablet 6  . timolol (BETIMOL) 0.5 % ophthalmic solution Place 1 drop into both eyes every morning.     . timolol (TIMOPTIC) 0.5 % ophthalmic solution 1 drop daily.    . [DISCONTINUED] hydrochlorothiazide (MICROZIDE) 12.5 MG capsule Take 12.5 mg by mouth daily.      . [DISCONTINUED] olmesartan (BENICAR) 40 MG tablet Take 40 mg by mouth daily.       No current facility-administered medications for this visit.    No Known Allergies  History   Social History  . Marital Status: Widowed    Spouse Name: N/A     Number of Children: N/A  . Years of Education: N/A   Occupational History  . Not on file.   Social History Main Topics  . Smoking status: Never Smoker   . Smokeless tobacco: Not on file  . Alcohol Use: 1.5 oz/week    3 Not specified per week     Comment: 3/week - mixed drinks  . Drug Use: No  . Sexual Activity: Not on file   Other Topics Concern  . Not on file   Social History Narrative   ROS- all systems are reviewed and negative except as per HPI above  Physical Exam: Filed Vitals:   03/26/14 1035  BP: 130/80  Pulse: 87  Height:  (1.702 m)  Weight: 157 lb (71.215 kg)    GEN- The patient is well appearing, alert and oriented x 3 today.   Head- normocephalic, atraumatic Eyes-  Sclera clear, conjunctiva pink Ears- hearing intact Oropharynx- clear Neck- supple, no JVP Lymph- no cervical lymphadenopathy Lungs- Clear to ausculation bilaterally, normal work of breathing Heart- Regular rate and rhythm (paced) GI- soft, NT, ND, + BS Extremities- no clubbing, cyanosis, or edema Neuro- strength and sensation are intact  Device interrogation is reviewed (see paceart) ekg today reveals AV pacing at 87 bpm  Assessment and Plan:  1. Persistent afib Maintaining sinus with low dose flecainide Continue long term anticoagulation  2. Complete heart block Normal pacemaker function See Pace Art report See changes below  3. HTN Stable  4. Am fatigue and dizziness- new issue I have decreased her pacing rate to 60 bpm from 70.  I have also increased ADL/ exertional level from 3 to 4. We walked her in the office today and her symptoms seemed "a little better" with appropriate increase in heart rate response. Stop furosemide per patients request Decrease bystolic to 2.5mg  daily for fatigue.  Return to see Kara Mead in the device clinic for follow-up on changes in 4 weeks Return to see EP NP in 6 months I will see again in 1 year

## 2014-03-26 NOTE — Patient Instructions (Addendum)
Your physician wants you to follow-up in: April 23, 2014 at 10:30am with Acie Fredrickson in the Device Clinic, 6 months with Norma Fredrickson or [employer] and in 12 months with Dr. Johney Frame. You will receive a reminder letter in the mail two months in advance. If you don't receive a letter, please call our office to schedule the follow-up appointment.  Remote monitoring is used to monitor your Pacemaker or ICD from home. This monitoring reduces the number of office visits required to check your device to one time per year. It allows [place] to keep an eye on the functioning of your device to ensure it is working properly. You are scheduled for a device check from home on 06/25/14. You may send your transmission at any time that day. If you have a wireless device, the transmission will be sent automatically. After your physician reviews your transmission, you will receive a postcard with your next transmission date.   Your physician has recommended you make the following change in your medication:   1) Decrease Bystolic to 1/2 tablet (2.5 mg total) by mouth once a day 2) Only take Furosemide as needed for swelling

## 2014-04-02 ENCOUNTER — Other Ambulatory Visit: Payer: Self-pay | Admitting: Internal Medicine

## 2014-04-02 MED ORDER — NEBIVOLOL HCL 5 MG PO TABS: 2.5000 mg | ORAL_TABLET | Freq: Every day | ORAL | Status: DC

## 2014-04-10 ENCOUNTER — Other Ambulatory Visit: Payer: Self-pay | Admitting: *Deleted

## 2014-04-10 MED ORDER — RIVAROXABAN 20 MG PO TABS
20.0000 mg | ORAL_TABLET | Freq: Every day | ORAL | Status: DC
Start: 2014-04-10 — End: 2014-08-08

## 2014-04-23 ENCOUNTER — Ambulatory Visit: Payer: Medicare Other | Admitting: *Deleted

## 2014-04-23 DIAGNOSIS — Z950 Presence of cardiac pacemaker: Secondary | ICD-10-CM

## 2014-04-23 LAB — MDC_IDC_ENUM_SESS_TYPE_INCLINIC
Battery Impedance: 429 Ohm
Battery Remaining Longevity: 77 mo
Battery Voltage: 2.77 V
Brady Statistic AP VP Percent: 91 %
Brady Statistic AP VS Percent: 0 %
Brady Statistic AS VP Percent: 9 %
Brady Statistic AS VS Percent: 0 %
Date Time Interrogation Session: 20160201105943
Lead Channel Impedance Value: 490 Ohm
Lead Channel Impedance Value: 645 Ohm
Lead Channel Pacing Threshold Amplitude: 1 V
Lead Channel Pacing Threshold Pulse Width: 0.4 ms
Lead Channel Setting Pacing Amplitude: 3 V
Lead Channel Setting Sensing Sensitivity: 2.8 mV
MDC IDC MSMT LEADCHNL RA PACING THRESHOLD PULSEWIDTH: 0.4 ms
MDC IDC MSMT LEADCHNL RA SENSING INTR AMPL: 4 mV
MDC IDC MSMT LEADCHNL RV PACING THRESHOLD AMPLITUDE: 1.25 V
MDC IDC SET LEADCHNL RA PACING AMPLITUDE: 2 V
MDC IDC SET LEADCHNL RV PACING PULSEWIDTH: 0.4 ms

## 2014-04-23 NOTE — Progress Notes (Addendum)
Patient presents for device clinic pacemaker check.  No problems with shortness of breath, chest pain, palpitations, or syncope.  Still with c/o fatigue.    Device interrogated and found to be functioning normally.  Labs with Dr. Evlyn KannerSouth today.  Question need for repeat echo, Allred to decide after lab results. No changes made today.  See PaceArt report for full details.  Plan ROV with Dr. Johney FrameAllred in 11 months.  Bethanie DickerLong, Kaithlyn Teagle M, RN, BSN 04/23/2014 10:16 AM

## 2014-04-25 ENCOUNTER — Encounter: Payer: Self-pay | Admitting: Internal Medicine

## 2014-05-07 ENCOUNTER — Other Ambulatory Visit: Payer: Self-pay | Admitting: Nurse Practitioner

## 2014-05-07 MED ORDER — FLECAINIDE ACETATE 50 MG PO TABS: 50.0000 mg | ORAL_TABLET | Freq: Two times a day (BID) | ORAL | Status: DC

## 2014-06-25 ENCOUNTER — Ambulatory Visit (INDEPENDENT_AMBULATORY_CARE_PROVIDER_SITE_OTHER): Payer: Medicare Other | Admitting: *Deleted

## 2014-06-25 DIAGNOSIS — I442 Atrioventricular block, complete: Secondary | ICD-10-CM

## 2014-06-25 NOTE — Progress Notes (Signed)
Remote pacemaker transmission.   

## 2014-06-27 LAB — MDC_IDC_ENUM_SESS_TYPE_REMOTE
Battery Remaining Longevity: 70 mo
Brady Statistic AP VP Percent: 93 %
Brady Statistic AP VS Percent: 0 %
Brady Statistic AS VP Percent: 7 %
Date Time Interrogation Session: 20160404133422
Lead Channel Impedance Value: 527 Ohm
Lead Channel Impedance Value: 644 Ohm
Lead Channel Pacing Threshold Amplitude: 1.625 V
Lead Channel Pacing Threshold Pulse Width: 0.4 ms
Lead Channel Setting Pacing Amplitude: 2 V
Lead Channel Setting Pacing Amplitude: 3.25 V
Lead Channel Setting Sensing Sensitivity: 2.8 mV
MDC IDC MSMT BATTERY IMPEDANCE: 528 Ohm
MDC IDC MSMT BATTERY VOLTAGE: 2.78 V
MDC IDC MSMT LEADCHNL RA PACING THRESHOLD AMPLITUDE: 0.5 V
MDC IDC MSMT LEADCHNL RV PACING THRESHOLD PULSEWIDTH: 0.4 ms
MDC IDC SET LEADCHNL RV PACING PULSEWIDTH: 0.4 ms
MDC IDC STAT BRADY AS VS PERCENT: 0 %

## 2014-07-09 ENCOUNTER — Encounter: Payer: Self-pay | Admitting: Cardiology

## 2014-07-11 ENCOUNTER — Encounter: Payer: Self-pay | Admitting: Internal Medicine

## 2014-08-08 ENCOUNTER — Other Ambulatory Visit: Payer: Self-pay | Admitting: Internal Medicine

## 2014-08-30 ENCOUNTER — Other Ambulatory Visit: Payer: Self-pay | Admitting: Internal Medicine

## 2014-08-31 NOTE — Telephone Encounter (Signed)
Per note 1.4.16 

## 2014-09-26 ENCOUNTER — Encounter: Payer: Self-pay | Admitting: *Deleted

## 2014-09-30 NOTE — Progress Notes (Signed)
Electrophysiology Office Note Date: 10/01/2014  ID:  Beth Santiago, DOB Sep 07, 1924, MRN [id number]  PCP: [name], MD Electrophysiologist: Johney Frame  CC: Pacemaker follow-up  Beth Santiago is a 81 y.o. female seen today for Dr Johney Frame. She presents today for routine electrophysiology followup.  At office visit with Dr Johney Frame in January of this year, she was complaining of increased fatigue.  Her base rate was decreased from 70 to 60 and her rate response was adjusted.  Since last being seen in our clinic, the patient reports doing very well.  She denies chest pain, palpitations, dyspnea, PND, orthopnea, nausea, vomiting, dizziness, syncope, edema, weight gain, or early satiety.  Hemoccult cards have tested positive and she is undergoing further workup with Dr Evlyn Kanner.   She feels that her BP has been running low at home and that she still is occasionally fatigued.   Device History: MDT dual chamber PPM implanted 2005 for complete heart block; generator change 2011   Past Medical History  Diagnosis Date  . Second degree heart block     a. s/p PPM 2005. b. Gen change to Medtronic by Dr Johney Frame  9/11 for premature ERI 11/2009.  Marland Kitchen Hypertension   . Hyperlipidemia   . DJD (degenerative joint disease)   . GERD (gastroesophageal reflux disease)   . CAD (coronary artery disease)     a. Nonobstructive by cath 2005 - 40-50% mid LAD. b. Nonischemic nuc 05/2011.  Marland Kitchen Hypothyroid   . Persistent atrial fibrillation 05/2011  . Biatrial enlargement     severe  . Carotid artery disease     a. 1-50% bilateral (upper end) - March 2011.  . Atrial flutter     a. Noted during pacemaker implantation 2011, spontaneously terminated.   Past Surgical History  Procedure Laterality Date  . Pacemaker insertion  2005, 12/11/09    implanted by Dr Reyes Ivan, generator change 9/11 by Fawn Kirk for premature ERI  . Tonsillectomy    . Colon surgery    . Hemorroidectomy    . Tee without cardioversion   06/11/2011    Procedure: TRANSESOPHAGEAL ECHOCARDIOGRAM (TEE);  Surgeon: Dolores Patty, MD;  Location: [facility] ENDOSCOPY;  Service: Cardiovascular;  Laterality: N/A;  . Cardioversion  06/11/2011    Procedure: CARDIOVERSION;  Surgeon: Dolores Patty, MD;  Location: [facility] ENDOSCOPY;  Service: Cardiovascular;  Laterality: N/A;  . Abdominal surgery      twisted bowel  . Cardioversion N/A 01/04/2013    Procedure: CARDIOVERSION;  Surgeon: Lars Masson, MD;  Location: [facility] ENDOSCOPY;  Service: Cardiovascular;  Laterality: N/A;    Current Outpatient Prescriptions  Medication Sig Dispense Refill  . amLODipine (NORVASC) 5 MG tablet TAKE 1 TABLET BY MOUTH DAILY 30 tablet 5  . CRESTOR 20 MG tablet Take 1 tablet by mouth at bedtime.    . fish oil-omega-3 fatty acids 1000 MG capsule Take 2 g by mouth daily.     . flecainide (TAMBOCOR) 50 MG tablet Take 1 tablet (50 mg total) by mouth 2 (two) times daily. (Patient taking differently: Take 50 mg by mouth once. ) 180 tablet 3  . Garlic 1000 MG CAPS Take 2 capsules by mouth daily.      Marland Kitchen latanoprost (XALATAN) 0.005 % ophthalmic solution Place 1 drop into both eyes at bedtime.      Marland Kitchen levothyroxine (SYNTHROID, LEVOTHROID) 88 MCG tablet     . Multiple Vitamins-Minerals (CENTRUM SILVER PO) Take 1 tablet by mouth daily.      . nebivolol (BYSTOLIC)  5 MG tablet Take 0.5 tablets (2.5 mg total) by mouth daily. 30 tablet 5  . omeprazole (PRILOSEC) 20 MG capsule Take 20 mg by mouth 2 (two) times daily.     . potassium chloride SA (K-DUR,KLOR-CON) 20 MEQ tablet TAKE 1 TABLET BY MOUTH EVERY MONDAY, WEDNESDAY, AND FRIDAY 30 tablet 6  . timolol (BETIMOL) 0.5 % ophthalmic solution Place 1 drop into both eyCarlena Hurlry morning.     . timolol (TIMOPTIC) 0.5 % ophthalmic solution 1 drop daily.    . XARELTO 20 MG TABS tablet TAKE 1 TABLET BY MOUTH ONCE DAILY WITH SUPPER 30 tablet 5  . furosemide (LASIX) 20 MG tablet Take 20 mg by mouth daily as needed (swelling).    .  [DISCONTINUED] hydrochlorothiazide (MICROZIDE) 12.5 MG capsule Take 12.5 mg by mouth daily.      . [DISCONTINUED] olmesartan (BENICAR) 40 MG tablet Take 40 mg by mouth daily.       No current facility-administered medications for this visit.    Allergies:   Review of patient's allergies indicates no known allergies.   Social History: History   Social History  . Marital Status: Widowed    Spouse Name: N/A  . Number of Children: N/A  . Years of Education: N/A   Occupational History  . Not on file.   Social History Main Topics  . Smoking status: Never Smoker   . Smokeless tobacco: Not on file  . Alcohol Use: 1.5 oz/week    3 Standard drinks or equivalent per week     Comment: 3/week - mixed drinks  . Drug Use: No  . Sexual Activity: Not on file   Other Topics Concern  . Not on file   Social History Narrative    Family History: Family History  Problem Relation Age of Onset  . Cancer Mother   . Diabetes Mother   . Hypertension Mother   . Heart disease Father   . Cancer Brother   . Heart disease Brother      Review of Systems: All other systems reviewed and are otherwise negative except as noted above.   Physical Exam: VS:  BP 100/60 mmHg  Pulse 61  Ht  (1.702 m)  Wt 147 lb 12.8 oz (67.042 kg)  BMI 23.14 kg/m2 , BMI Body mass index is 23.14 kg/(m^2).  GEN- The patient is elderly appearing, alert and oriented x 3 today.   HEENT: normocephalic, atraumatic; sclera clear, conjunctiva pink; hearing intact; oropharynx clear; neck supple, no JVP Lymph- no cervical lymphadenopathy Lungs- Clear to ausculation bilaterally, normal work of breathing.  No wheezes, rales, rhonchi Heart- Regular rate and rhythm (paced) GI- soft, non-tender, non-distended, bowel sounds present, no hepatosplenomegaly Extremities- no clubbing, cyanosis, or edema MS- no significant deformity or atrophy Skin- warm and dry, no rash or lesion; PPM pocket well healed Psych- euthymic mood,  full affect Neuro- strength and sensation are intact  PPM Interrogation- reviewed in detail today,  See PACEART report  EKG:  EKG is not ordered today.  Recent Labs: No results found for requested labs within last 365 days.   Wt Readings from Last 3 Encounters:  10/01/14 147 lb 12.8 oz (67.042 kg)  03/26/14 157 lb (71.215 kg)  08/16/13 158 lb 3.2 oz (71.759 kg)     Other studies Reviewed: Additional studies/ records that were reviewed today include: Dr Jenel Lucks office notes; recent labs  Assessment and Plan:  1.  Complete heart block Normal PPM function - pt is dependent today  See Pace Art report No changes today  2.  Persistent atrial fibrillation Predominately maintaining SR by device interrogation today Continue Xarelto for CHADS2VASC of at least 5  3.  HTN BP today 100/60  Will discontinue Amlodipine today Pt aware to call the office to resume if SBP consistently >130 at home   Current medicines are reviewed at length with the patient today.   The patient does not have concerns regarding her medicines.  The following changes were made today:  Stop amlodipine  Labs/ tests ordered today include:  Orders Placed This Encounter  Procedures  . Implantable device check     Disposition:   Follow up with Dr Johney FrameAllred in 6 months as scheduled   Signed, Gypsy BalsamAmber Blaise Grieshaber, NP 10/01/2014 11:38 AM  [facility] [address] [place] [address] [phone] (office) [phone] (fax)

## 2014-10-01 ENCOUNTER — Encounter: Payer: Self-pay | Admitting: Internal Medicine

## 2014-10-01 ENCOUNTER — Ambulatory Visit (INDEPENDENT_AMBULATORY_CARE_PROVIDER_SITE_OTHER): Payer: Medicare Other | Admitting: Nurse Practitioner

## 2014-10-01 ENCOUNTER — Encounter: Payer: Self-pay | Admitting: Nurse Practitioner

## 2014-10-01 VITALS — BP 100/60 | HR 61 | Ht 67.0 in | Wt 147.8 lb

## 2014-10-01 DIAGNOSIS — I442 Atrioventricular block, complete: Secondary | ICD-10-CM

## 2014-10-01 DIAGNOSIS — I10 Essential (primary) hypertension: Secondary | ICD-10-CM | POA: Diagnosis not present

## 2014-10-01 DIAGNOSIS — I481 Persistent atrial fibrillation: Secondary | ICD-10-CM

## 2014-10-01 DIAGNOSIS — I4819 Other persistent atrial fibrillation: Secondary | ICD-10-CM

## 2014-10-01 LAB — CUP PACEART INCLINIC DEVICE CHECK
Date Time Interrogation Session: 20160711113806
Lead Channel Setting Pacing Amplitude: 3.25 V
Lead Channel Setting Pacing Pulse Width: 0.4 ms
MDC IDC SET LEADCHNL RA PACING AMPLITUDE: 2 V
MDC IDC SET LEADCHNL RV SENSING SENSITIVITY: 2.8 mV

## 2014-10-01 NOTE — Patient Instructions (Signed)
Medication Instructions:  Your physician has recommended you make the following change in your medication:   1-STOP Amlodipine   2- If SBP is over 130, you can start Amlodipine again.  Lab work: NONE  Testing/Procedures: NONE  Follow-Up: Your physician wants you to follow-up in: 6 months with Dr. Johney FrameAllred. You will receive a reminder letter in the mail two months in advance. If you don't receive a letter, please call our office to schedule the follow-up appointment.   Any Other Special Instructions Will Be Listed Below (If Applicable).

## 2014-10-24 ENCOUNTER — Telehealth: Payer: Self-pay | Admitting: Internal Medicine

## 2014-10-24 NOTE — Telephone Encounter (Signed)
Error

## 2014-12-28 ENCOUNTER — Other Ambulatory Visit: Payer: Self-pay | Admitting: Orthopedic Surgery

## 2014-12-28 DIAGNOSIS — R52 Pain, unspecified: Secondary | ICD-10-CM

## 2015-01-01 ENCOUNTER — Telehealth: Payer: Self-pay | Admitting: Internal Medicine

## 2015-01-01 NOTE — Telephone Encounter (Signed)
New message     1. What dental office are you calling from? Dr. Shelda Altes   2. What is your office phone and fax number? [phone]  3. What type of procedure is the patient having performed? Toot extraction    4. What date is procedure scheduled? Pending    5. What is your question (ex. Antibiotics prior to procedure, holding medication-we need to know how long dentist wants pt to hold med)?  On xarelto now , or any other med's that needs to be held.

## 2015-01-02 NOTE — Telephone Encounter (Signed)
Pt has a CHADS score of 2.  No history of TIA or stroke.  Okay to hold Xarelto x 24 hours prior to procedure.  Will fax to Dr. Hazeline JunkerMottinger's office.

## 2015-01-03 ENCOUNTER — Telehealth: Payer: Self-pay | Admitting: Internal Medicine

## 2015-01-03 NOTE — Telephone Encounter (Signed)
Called to reverify fax number [phone].  Clearance refaxed.

## 2015-01-03 NOTE — Telephone Encounter (Addendum)
New message       Please refax dental clearance.  The office did not receive it

## 2015-01-07 ENCOUNTER — Other Ambulatory Visit: Payer: Self-pay | Admitting: Internal Medicine

## 2015-02-26 ENCOUNTER — Other Ambulatory Visit: Payer: Self-pay | Admitting: Internal Medicine

## 2015-02-27 NOTE — Telephone Encounter (Signed)
Refill rqst for Amlodipine received from pt pharmacy. Pt o/v in 09/2014 with Hollie SalkAmber Seilers,NP, pt was instructed to stop, she should resume if her systolic bp was >130. Called pt to verify that she is taking Amlodipine 5mg  qd. Pt sts that she is and that she does need a refill.

## 2015-03-20 ENCOUNTER — Other Ambulatory Visit: Payer: Self-pay | Admitting: Internal Medicine

## 2015-04-15 ENCOUNTER — Encounter: Payer: Self-pay | Admitting: Internal Medicine

## 2015-04-15 ENCOUNTER — Other Ambulatory Visit: Payer: Self-pay | Admitting: Internal Medicine

## 2015-04-15 ENCOUNTER — Ambulatory Visit (INDEPENDENT_AMBULATORY_CARE_PROVIDER_SITE_OTHER): Payer: Medicare Other | Admitting: Internal Medicine

## 2015-04-15 VITALS — BP 118/78 | HR 63 | Ht 67.0 in | Wt 149.2 lb

## 2015-04-15 DIAGNOSIS — I481 Persistent atrial fibrillation: Secondary | ICD-10-CM | POA: Diagnosis not present

## 2015-04-15 DIAGNOSIS — I4819 Other persistent atrial fibrillation: Secondary | ICD-10-CM

## 2015-04-15 DIAGNOSIS — I442 Atrioventricular block, complete: Secondary | ICD-10-CM

## 2015-04-15 DIAGNOSIS — I10 Essential (primary) hypertension: Secondary | ICD-10-CM | POA: Diagnosis not present

## 2015-04-15 LAB — CUP PACEART INCLINIC DEVICE CHECK
Battery Impedance: 756 Ohm
Battery Remaining Longevity: 60 mo
Implantable Lead Implant Date: 20050927
Implantable Lead Implant Date: 20050927
Implantable Lead Model: 5076
Lead Channel Pacing Threshold Pulse Width: 0.4 ms
Lead Channel Setting Pacing Amplitude: 2 V
Lead Channel Setting Pacing Pulse Width: 0.4 ms
Lead Channel Setting Sensing Sensitivity: 2.8 mV
MDC IDC LEAD LOCATION: 753859
MDC IDC LEAD LOCATION: 753860
MDC IDC MSMT BATTERY VOLTAGE: 2.78 V
MDC IDC MSMT LEADCHNL RA IMPEDANCE VALUE: 582 Ohm
MDC IDC MSMT LEADCHNL RA SENSING INTR AMPL: 0.35 mV
MDC IDC MSMT LEADCHNL RV IMPEDANCE VALUE: 441 Ohm
MDC IDC MSMT LEADCHNL RV PACING THRESHOLD AMPLITUDE: 1.25 V
MDC IDC SESS DTM: 20170123113738
MDC IDC SET LEADCHNL RV PACING AMPLITUDE: 2.75 V
MDC IDC STAT BRADY AP VP PERCENT: 82 %
MDC IDC STAT BRADY AP VS PERCENT: 0 %
MDC IDC STAT BRADY AS VP PERCENT: 18 %
MDC IDC STAT BRADY AS VS PERCENT: 0 %

## 2015-04-15 MED ORDER — FLECAINIDE ACETATE 50 MG PO TABS: 50.0000 mg | ORAL_TABLET | Freq: Two times a day (BID) | ORAL | Status: DC

## 2015-04-15 NOTE — Progress Notes (Signed)
PCP:  [name], MD  The patient presents today for routine electrophysiology followup. She was taking flecainide  only once daily.  She has been in persistent afib since March 06, 2015.  She seems mostly unaware of afib.  She may have just a little more fatigue.  Her memory is about the same.  Today, she denies symptoms of palpitations, chest pain,  orthopnea, PND, lower extremity edema, dizziness, presyncope, syncope, or neurologic sequela.  The patient feels that she is tolerating medications without difficulties and is otherwise without complaint today.   Past Medical History  Diagnosis Date  . Second degree heart block     a. s/p PPM 2005. b. Gen change to Medtronic by Dr Johney Frame  9/11 for premature ERI 11/2009.  Marland Kitchen Hypertension   . Hyperlipidemia   . DJD (degenerative joint disease)   . GERD (gastroesophageal reflux disease)   . CAD (coronary artery disease)     a. Nonobstructive by cath 2005 - 40-50% mid LAD. b. Nonischemic nuc 05/2011.  Marland Kitchen Hypothyroid   . Persistent atrial fibrillation (HCC) 05/2011  . Biatrial enlargement     severe  . Carotid artery disease (HCC)     a. 1-50% bilateral (upper end) - March 2011.  . Atrial flutter (HCC)     a. Noted during pacemaker implantation 2011, spontaneously terminated.   Past Surgical History  Procedure Laterality Date  . Pacemaker insertion  2005, 12/11/09    implanted by Dr Reyes Ivan, generator change 9/11 by Fawn Kirk for premature ERI  . Tonsillectomy    . Colon surgery    . Hemorroidectomy    . Tee without cardioversion  06/11/2011    Procedure: TRANSESOPHAGEAL ECHOCARDIOGRAM (TEE);  Surgeon: Dolores Patty, MD;  Location: [facility] ENDOSCOPY;  Service: Cardiovascular;  Laterality: N/A;  . Cardioversion  06/11/2011    Procedure: CARDIOVERSION;  Surgeon: Dolores Patty, MD;  Location: [facility] ENDOSCOPY;  Service: Cardiovascular;  Laterality: N/A;  . Abdominal surgery      twisted bowel  . Cardioversion N/A 01/04/2013     Procedure: CARDIOVERSION;  Surgeon: Lars Masson, MD;  Location: [facility] ENDOSCOPY;  Service: Cardiovascular;  Laterality: N/A;    Current Outpatient Prescriptions  Medication Sig Dispense Refill  . amLODipine (NORVASC) 5 MG tablet TAKE 1 TABLET BY MOUTH DAILY 30 tablet 5  . CRESTOR 20 MG tablet Take 1 tablet by mouth at bedtime.    . fish oil-omega-3 fatty acids 1000 MG capsule Take 2 g by mouth daily.     . flecainide (TAMBOCOR) 50 MG tablet Take 50 mg by mouth daily.    . furosemide (LASIX) 20 MG tablet Take 20 mg by mouth daily as needed (swelling).    . Garlic 1000 MG CAPS Take 2 capsules by mouth daily.      Marland Kitchen latanoprost (XALATAN) 0.005 % ophthalmic solution Place 1 drop into both eyes at bedtime.      Marland Kitchen levothyroxine (SYNTHROID, LEVOTHROID) 88 MCG tablet Take 1 mcg by mouth daily.     . Multiple Vitamins-Minerals (CENTRUM SILVER PO) Take 1 tablet by mouth daily.      . nebivolol (BYSTOLIC) 5 MG tablet Take 0.5 tablets (2.5 mg total) by mouth daily. 30 tablet 5  . omeprazole (PRILOSEC) 20 MG capsule Take 20 mg by mouth 2 (two) times daily.     . potassium chloride SA (K-DUR,KLOR-CON) 20 MEQ tablet TAKE 1 TABLET BY MOUTH EVERY MONDAY, WEDNESDAY, AND FRIDAY 30 tablet 6  . timolol (BETIMOL) 0.5 %  ophthalmic solution Place 1 drop into both eyes every morning.     Carlena Hurl 20 MG TABS tablet TAKE 1 TABLET BY MOUTH ONCE DAILY WITH SUPPER 30 tablet 0  . [DISCONTINUED] hydrochlorothiazide (MICROZIDE) 12.5 MG capsule Take 12.5 mg by mouth daily.      . [DISCONTINUED] olmesartan (BENICAR) 40 MG tablet Take 40 mg by mouth daily.       No current facility-administered medications for this visit.    No Known Allergies  Social History   Social History  . Marital Status: Widowed    Spouse Name: N/A  . Number of Children: N/A  . Years of Education: N/A   Occupational History  . Not on file.   Social History Main Topics  . Smoking status: Never Smoker   . Smokeless tobacco: Not on file   . Alcohol Use: 1.5 oz/week    3 Standard drinks or equivalent per week     Comment: 3/week - mixed drinks  . Drug Use: No  . Sexual Activity: Not on file   Other Topics Concern  . Not on file   Social History Narrative   ROS- all systems are reviewed and negative except as per HPI above  Physical Exam: Filed Vitals:   04/15/15 1105  BP: 118/78  Pulse: 63  Height:  (1.702 m)  Weight: 149 lb 3.2 oz (67.677 kg)    GEN- The patient is well appearing, alert and oriented x 3 today.   Head- normocephalic, atraumatic Eyes-  Sclera clear, conjunctiva pink Ears- hearing intact Oropharynx- clear Neck- supple, no JVP Lymph- no cervical lymphadenopathy Lungs- Clear to ausculation bilaterally, normal work of breathing Heart- Regular rate and rhythm (paced) GI- soft, NT, ND, + BS Extremities- no clubbing, cyanosis, or edema Neuro- strength and sensation are intact  Device interrogation is reviewed (see paceart) ekg today reveals afib with V pacing  Assessment and Plan:  1. Persistent afib Now back in afib on flecainide  daily She is not certain if she is symptomatic and currently does not want to have cardioversion Will increase flecainide to  BID x 2 days then  BID thereafter Return in 6-8 weeks to follow-up with Gypsy Balsam NP.   If in afib and asymptomatic, could stop flecainide and rate control at that time If she remains in afib with symptoms then I would advise cardioversion.  The patient and her daughter like this approach.  2. Complete heart block Normal pacemaker function See Pace Art report See changes below  3. HTN Stable  Today, I have spent 25 minutes with the patient discussing afib .  More than 50% of the visit time today was spent on this issue.    [name] MD, [facility] 04/15/2015 11:30 AM

## 2015-04-15 NOTE — Patient Instructions (Signed)
Medication Instructions:  Your physician has recommended you make the following change in your medication:   1) Increase Flecainide to  twice daily for 2 days then back down to 50 mg twice daily   Labwork: None ordered   Testing/Procedures: None ordered   Follow-Up: Your physician recommends that you schedule a follow-up appointment in: 2 months with Gypsy Balsam, NP   Any Other Special Instructions Will Be Listed Below (If Applicable).     If you need a refill on your cardiac medications before your next appointment, please call your pharmacy.

## 2015-04-25 ENCOUNTER — Telehealth: Payer: Self-pay | Admitting: Internal Medicine

## 2015-04-25 NOTE — Telephone Encounter (Signed)
Please call,she have some concerns about her mother's condition. Pt might need to be seen,please call to advise.

## 2015-04-26 NOTE — Telephone Encounter (Signed)
I have scheduled for her for 05/23/15 at 11:20 to follow up

## 2015-04-26 NOTE — Telephone Encounter (Signed)
Left message on the machine for her daughter to return my call

## 2015-05-09 ENCOUNTER — Encounter: Payer: Self-pay | Admitting: Nurse Practitioner

## 2015-05-23 ENCOUNTER — Ambulatory Visit: Payer: Medicare Other | Admitting: Nurse Practitioner

## 2015-06-06 ENCOUNTER — Other Ambulatory Visit: Payer: Self-pay | Admitting: Internal Medicine

## 2015-06-12 ENCOUNTER — Ambulatory Visit (INDEPENDENT_AMBULATORY_CARE_PROVIDER_SITE_OTHER): Payer: Medicare Other | Admitting: Internal Medicine

## 2015-06-12 ENCOUNTER — Encounter: Payer: Self-pay | Admitting: Internal Medicine

## 2015-06-12 VITALS — BP 120/76 | HR 62 | Ht 67.0 in | Wt 151.8 lb

## 2015-06-12 DIAGNOSIS — I4819 Other persistent atrial fibrillation: Secondary | ICD-10-CM

## 2015-06-12 DIAGNOSIS — I442 Atrioventricular block, complete: Secondary | ICD-10-CM

## 2015-06-12 DIAGNOSIS — I481 Persistent atrial fibrillation: Secondary | ICD-10-CM | POA: Diagnosis not present

## 2015-06-12 DIAGNOSIS — I10 Essential (primary) hypertension: Secondary | ICD-10-CM | POA: Diagnosis not present

## 2015-06-12 NOTE — Progress Notes (Signed)
PCP:  [name], MD  The patient presents today for routine electrophysiology followup.  She has been in persistent afib since March 06, 2015 despite flecainide.  She seems mostly unaware of afib.  She may have just a little more fatigue.  Her memory is about the same.  Today, she denies symptoms of palpitations, chest pain,  orthopnea, PND, lower extremity edema, dizziness, presyncope, syncope, or neurologic sequela.  The patient feels that she is tolerating medications without difficulties and is otherwise without complaint today.   Past Medical History  Diagnosis Date  . Second degree heart block     a. s/p PPM 2005. b. Gen change to Medtronic by Dr Johney Frame  9/11 for premature ERI 11/2009.  Marland Kitchen Hypertension   . Hyperlipidemia   . DJD (degenerative joint disease)   . GERD (gastroesophageal reflux disease)   . CAD (coronary artery disease)     a. Nonobstructive by cath 2005 - 40-50% mid LAD. b. Nonischemic nuc 05/2011.  Marland Kitchen Hypothyroid   . Persistent atrial fibrillation (HCC) 05/2011  . Biatrial enlargement     severe  . Carotid artery disease (HCC)     a. 1-50% bilateral (upper end) - March 2011.  . Atrial flutter (HCC)     a. Noted during pacemaker implantation 2011, spontaneously terminated.   Past Surgical History  Procedure Laterality Date  . Pacemaker insertion  2005, 12/11/09    implanted by Dr Reyes Ivan, generator change 9/11 by Fawn Kirk for premature ERI  . Tonsillectomy    . Colon surgery    . Hemorroidectomy    . Tee without cardioversion  06/11/2011    Procedure: TRANSESOPHAGEAL ECHOCARDIOGRAM (TEE);  Surgeon: Dolores Patty, MD;  Location: [facility] ENDOSCOPY;  Service: Cardiovascular;  Laterality: N/A;  . Cardioversion  06/11/2011    Procedure: CARDIOVERSION;  Surgeon: Dolores Patty, MD;  Location: [facility] ENDOSCOPY;  Service: Cardiovascular;  Laterality: N/A;  . Abdominal surgery      twisted bowel  . Cardioversion N/A 01/04/2013    Procedure: CARDIOVERSION;  Surgeon:  Lars Masson, MD;  Location: [facility] ENDOSCOPY;  Service: Cardiovascular;  Laterality: N/A;    Current Outpatient Prescriptions  Medication Sig Dispense Refill  . amLODipine (NORVASC) 5 MG tablet TAKE 1 TABLET BY MOUTH DAILY 30 tablet 5  . BYSTOLIC 5 MG tablet TAKE 1/2 TABLET BY MOUTH DAILY 30 tablet 1  . CRESTOR 20 MG tablet Take 1 tablet by mouth at bedtime.    . fish oil-omega-3 fatty acids 1000 MG capsule Take 2 g by mouth daily.     . flecainide (TAMBOCOR) 50 MG tablet Take 1 tablet (50 mg total) by mouth 2 (two) times daily. 180 tablet 3  . furosemide (LASIX) 20 MG tablet Take 20 mg by mouth daily as needed (swelling).    . Garlic 1000 MG CAPS Take 2 capsules by mouth daily.      Marland Kitchen latanoprost (XALATAN) 0.005 % ophthalmic solution Place 1 drop into both eyes at bedtime.      Marland Kitchen levothyroxine (SYNTHROID, LEVOTHROID) 88 MCG tablet Take 1 mcg by mouth daily.     . Multiple Vitamins-Minerals (CENTRUM SILVER PO) Take 1 tablet by mouth daily.      Marland Kitchen omeprazole (PRILOSEC) 20 MG capsule Take 20 mg by mouth 2 (two) times daily.     . potassium chloride SA (K-DUR,KLOR-CON) 20 MEQ tablet TAKE 1 TABLET BY MOUTH EVERY MONDAY, WEDNESDAY, AND FRIDAY 30 tablet 6  . timolol (BETIMOL) 0.5 % ophthalmic solution  Place 1 drop into both eyes every morning.     Carlena Hurl. XARELTO 20 MG TABS tablet TAKE 1 TABLET BY MOUTH DAILY WITH SUPPER 30 tablet 9  . [DISCONTINUED] hydrochlorothiazide (MICROZIDE) 12.5 MG capsule Take 12.5 mg by mouth daily.      . [DISCONTINUED] olmesartan (BENICAR) 40 MG tablet Take 40 mg by mouth daily.       No current facility-administered medications for this visit.    No Known Allergies  Social History   Social History  . Marital Status: Widowed    Spouse Name: N/A  . Number of Children: N/A  . Years of Education: N/A   Occupational History  . Not on file.   Social History Main Topics  . Smoking status: Never Smoker   . Smokeless tobacco: Not on file  . Alcohol Use: 1.5 oz/week     3 Standard drinks or equivalent per week     Comment: 3/week - mixed drinks  . Drug Use: No  . Sexual Activity: Not on file   Other Topics Concern  . Not on file   Social History Narrative   ROS- all systems are reviewed and negative except as per HPI above  Physical Exam: Filed Vitals:   06/12/15 1412  BP: 120/76  Pulse: 62  Height: 5\' 7"  (1.702 m)  Weight: 151 lb 12.8 oz (68.856 kg)    GEN- The patient is well appearing, alert and oriented x 3 today.   Head- normocephalic, atraumatic Eyes-  Sclera clear, conjunctiva pink Ears- hearing intact Oropharynx- clear Neck- supple, no JVP Lymph- no cervical lymphadenopathy Lungs- Clear to ausculation bilaterally, normal work of breathing Heart- Regular rate and rhythm (paced) GI- soft, NT, ND, + BS Extremities- no clubbing, cyanosis, or edema Neuro- strength and sensation are intact  Device interrogation is reviewed (see paceart) ekg today reveals afib with V pacing  Assessment and Plan:  1. Persistent afib We discussed cardioversion today.  She is clear that she wishes to avoid this.  I will therefore stop flecainide today.  Continue long term anticoagulation  2. Complete heart block Normal pacemaker function See Pace Art report V threshold has increased slightly (1.5V @0 .5 msec today).  Will continue to follow this.  Impedance is stable. Will reprogram VVIR  3. HTN Stable  Return to see EP NP in 3 months to make she she is still doing well with AF and that RV lead threshold is stable carelink I will see again in 1 year   Hillis RangeJames Gilliam Hawkes MD, [facility] 06/12/2015 2:34 PM

## 2015-06-12 NOTE — Patient Instructions (Addendum)
Medication Instructions:  Your physician has recommended you make the following change in your medication:  1) STOP Flecainide  Labwork: None  ordered  Testing/Procedures: None ordered  Follow-Up: Your physician recommends that you schedule a follow-up appointment in: 3 months with Gypsy BalsamAmber Seiler, NP.  Your physician wants you to follow-up in: 1 year with Dr. Johney FrameAllred.  You will receive a reminder letter in the mail two months in advance. If you don't receive a letter, please call our office to schedule the follow-up appointment.  If you need a refill on your cardiac medications before your next appointment, please call your pharmacy.  Thank you for choosing [facility]!!

## 2015-06-13 LAB — CUP PACEART INCLINIC DEVICE CHECK
Implantable Lead Implant Date: 20050927
Implantable Lead Implant Date: 20050927
Implantable Lead Location: 753859
Implantable Lead Model: 5076
Lead Channel Impedance Value: 432 Ohm
Lead Channel Impedance Value: 564 Ohm
Lead Channel Pacing Threshold Amplitude: 1.5 V
Lead Channel Pacing Threshold Pulse Width: 0.4 ms
Lead Channel Sensing Intrinsic Amplitude: 0.25 mV
MDC IDC LEAD LOCATION: 753860
MDC IDC MSMT BATTERY IMPEDANCE: 859 Ohm
MDC IDC MSMT BATTERY REMAINING LONGEVITY: 44 mo
MDC IDC MSMT BATTERY VOLTAGE: 2.77 V
MDC IDC SESS DTM: 20170322171734
MDC IDC SET LEADCHNL RV PACING AMPLITUDE: 3.75 V
MDC IDC SET LEADCHNL RV PACING PULSEWIDTH: 0.4 ms
MDC IDC SET LEADCHNL RV SENSING SENSITIVITY: 2.8 mV
MDC IDC STAT BRADY RV PERCENT PACED: 100 %

## 2015-08-16 ENCOUNTER — Other Ambulatory Visit: Payer: Self-pay | Admitting: Internal Medicine

## 2015-09-11 ENCOUNTER — Telehealth: Payer: Self-pay | Admitting: Internal Medicine

## 2015-09-11 NOTE — Telephone Encounter (Signed)
New message      1. Has your device fired? no 2. Is you device beeping? no  3. Are you experiencing draining or swelling at device site? no  4. Are you calling to see if we received your device transmission? Making sure the clinic is getting the transmission, please call the daughter back  5. Have you passed out? no

## 2015-09-11 NOTE — Telephone Encounter (Signed)
Spoke with Beth Santiago. She was trying to send a transmission and wanted to know why it did not complete. I walked her through set-up with 2490 monitor and Wire X adapter and told her about the transmission process. I let her know that it was not necessary to send a transmission, but it will be fine if they just want to practice. [name] has an appointment with Beth BalsamAmber Seiler, NP 09/19/15 at 11am. She verbalizes understanding and is appreciative of returned call.

## 2015-09-12 ENCOUNTER — Other Ambulatory Visit: Payer: Self-pay | Admitting: Internal Medicine

## 2015-09-18 NOTE — Progress Notes (Signed)
Electrophysiology Office Note Date: 09/19/2015  ID:  Beth Santiago, DOB [birth year], MRN [id number]  PCP: Julian HySOUTH,[name] ALAN, MD Electrophysiologist: Johney FrameAllred  CC: Pacemaker follow-up  Beth Santiago is a 82 y.o. female seen today for Dr Johney FrameAllred. She presents today for routine electrophysiology followup.  She is now in permanent atrial fibrillation.  She was tried on increased Flecainide with persistence of AF and didn't want to try DCCV with few symptoms.  Since last being seen in our clinic, the patient reports doing relatively well.  She states that she does not have as much energy as she did when she was 37.  She denies chest pain, palpitations, dyspnea, PND, orthopnea, nausea, vomiting, dizziness, syncope, weight gain, or early satiety. She has had some LE edema that resolves with leg elevation and prn lasix.    Device History: MDT dual chamber PPM implanted 2005 for complete heart block; generator change 2011   Past Medical History  Diagnosis Date  . Second degree heart block     a. s/p PPM 2005. b. Gen change to Medtronic by Dr Johney FrameAllred  9/11 for premature ERI 11/2009.  Marland Kitchen. Hypertension   . Hyperlipidemia   . DJD (degenerative joint disease)   . GERD (gastroesophageal reflux disease)   . CAD (coronary artery disease)     a. Nonobstructive by cath 2005 - 40-50% mid LAD. b. Nonischemic nuc 05/2011.  Marland Kitchen. Hypothyroid   . Persistent atrial fibrillation (HCC) 05/2011  . Biatrial enlargement     severe  . Carotid artery disease (HCC)     a. 1-50% bilateral (upper end) - March 2011.  . Atrial flutter (HCC)     a. Noted during pacemaker implantation 2011, spontaneously terminated.   Past Surgical History  Procedure Laterality Date  . Pacemaker insertion  2005, 12/11/09    implanted by Dr Reyes IvanKersey, generator change 9/11 by Fawn KirkJA for premature ERI  . Tonsillectomy    . Colon surgery    . Hemorroidectomy    . Tee without cardioversion  06/11/2011    Procedure: TRANSESOPHAGEAL  ECHOCARDIOGRAM (TEE);  Surgeon: Dolores Pattyaniel R Bensimhon, MD;  [facility];  Service: Cardiovascular;  Laterality: N/A;  . Cardioversion  06/11/2011    Procedure: CARDIOVERSION;  Surgeon: Dolores Pattyaniel R Bensimhon, MD;  [facility];  Service: Cardiovascular;  Laterality: N/A;  . Abdominal surgery      twisted bowel  . Cardioversion N/A 01/04/2013    Procedure: CARDIOVERSION;  Surgeon: Lars MassonKatarina H Nelson, MD;  [facility];  Service: Cardiovascular;  Laterality: N/A;    Current Outpatient Prescriptions  Medication Sig Dispense Refill  . amLODipine (NORVASC) 5 MG tablet TAKE 1 TABLET BY MOUTH DAILY 30 tablet 5  . BYSTOLIC 5 MG tablet TAKE 1/2 TABLET BY MOUTH DAILY 15 tablet 9  . CRESTOR 20 MG tablet Take 1 tablet by mouth at bedtime.    . fish oil-omega-3 fatty acids 1000 MG capsule Take 2 g by mouth daily.     . furosemide (LASIX) 20 MG tablet Take 20 mg by mouth daily as needed (swelling).    . Garlic 1000 MG CAPS Take 2 capsules by mouth daily.      Marland Kitchen. latanoprost (XALATAN) 0.005 % ophthalmic solution Place 1 drop into both eyes at bedtime.      Marland Kitchen. levothyroxine (SYNTHROID, LEVOTHROID) 88 MCG tablet Take 1 mcg by mouth daily.     . Multiple Vitamins-Minerals (CENTRUM SILVER PO) Take 1 tablet by mouth daily.      Marland Kitchen. omeprazole (  PRILOSEC) 20 MG capsule Take 20 mg by mouth 2 (two) times daily.     . potassium chloride SA (K-DUR,KLOR-CON) 20 MEQ tablet TAKE 1 TABLET BY MOUTH ON MONDAY,WEDNESDAY AND FRIDAY 45 tablet 1  . timolol (BETIMOL) 0.5 % ophthalmic solution Place 1 drop into both eyes every morning.     . triamcinolone cream (KENALOG) 0.1 % Apply 1 application topically as directed.    Carlena Hurl. XARELTO 20 MG TABS tablet TAKE 1 TABLET BY MOUTH DAILY WITH SUPPER 30 tablet 9  . [DISCONTINUED] hydrochlorothiazide (MICROZIDE) 12.5 MG capsule Take 12.5 mg by mouth daily.      . [DISCONTINUED] olmesartan (BENICAR) 40 MG tablet Take 40 mg by mouth daily.       No current  facility-administered medications for this visit.    Allergies:   Review of patient's allergies indicates no known allergies.   Social History: Social History   Social History  . Marital Status: Widowed    Spouse Name: N/A  . Number of Children: N/A  . Years of Education: N/A   Occupational History  . Not on file.   Social History Main Topics  . Smoking status: Never Smoker   . Smokeless tobacco: Not on file  . Alcohol Use: 1.5 oz/week    3 Standard drinks or equivalent per week     Comment: 3/week - mixed drinks  . Drug Use: No  . Sexual Activity: Not on file   Other Topics Concern  . Not on file   Social History Narrative    Family History: Family History  Problem Relation Age of Onset  . Cancer Mother   . Diabetes Mother   . Hypertension Mother   . Heart disease Father   . Cancer Brother   . Heart disease Brother      Review of Systems: All other systems reviewed and are otherwise negative except as noted above.   Physical Exam: VS:  BP 128/76 mmHg  Pulse 87  Ht 5\' 7"  (1.702 m)  Wt 147 lb 12.8 oz (67.042 kg)  BMI 23.14 kg/m2  SpO2 98% , BMI Body mass index is 23.14 kg/(m^2).  GEN- The patient is elderly appearing, alert and oriented x 3 today.   HEENT: normocephalic, atraumatic; sclera clear, conjunctiva pink; hearing intact; oropharynx clear; neck supple  Lungs- Clear to ausculation bilaterally, normal work of breathing.  No wheezes, rales, rhonchi Heart- Regular rate and rhythm (paced) GI- soft, non-tender, non-distended, bowel sounds present  Extremities- no clubbing, cyanosis, or edema MS- no significant deformity or atrophy Skin- warm and dry, no rash or lesion; PPM pocket well healed Psych- euthymic mood, full affect Neuro- strength and sensation are intact  PPM Interrogation- reviewed in detail today,  See PACEART report  EKG:  EKG is not ordered today.  Recent Labs: No results found for requested labs within last 365 days.   Wt  Readings from Last 3 Encounters:  09/19/15 147 lb 12.8 oz (67.042 kg)  06/12/15 151 lb 12.8 oz (68.856 kg)  04/15/15 149 lb 3.2 oz (67.677 kg)     Other studies Reviewed: Additional studies/ records that were reviewed today include: Dr Jenel LucksAllred's office notes; recent labs  Assessment and Plan:  1.  Complete heart block Normal PPM function - pt is dependent today See Pace Art report Pulse width changed to 0.7976msec instead of 0.324msec with slightly elevated V threshold She has been unable to get home monitor to work with [employer] adapter. Will forgo remote checks for now  with relatively frequent office visits and single chamber PPM  2.  Permanent atrial fibrillation No plans to pursue further attempts at rhythm control with limited symptoms Continue Xarelto for CHADS2VASC of at least 5 CBC, BMET today   3.  HTN Stable No change required today  Current medicines are reviewed at length with the patient today.   The patient does not have concerns regarding her medicines.  The following changes were made today:  none  Labs/ tests ordered today include: CBC, BMET No orders of the defined types were placed in this encounter.     Disposition:   Follow up with Dr Johney Frame 6 months    Signed, Gypsy Balsam, NP 09/19/2015 11:13 AM  [facility] [address] [place] [address] [phone] (office) [phone] (fax)

## 2015-09-19 ENCOUNTER — Encounter: Payer: Self-pay | Admitting: Nurse Practitioner

## 2015-09-19 ENCOUNTER — Ambulatory Visit (INDEPENDENT_AMBULATORY_CARE_PROVIDER_SITE_OTHER): Payer: Medicare Other | Admitting: Nurse Practitioner

## 2015-09-19 ENCOUNTER — Encounter: Payer: Self-pay | Admitting: Internal Medicine

## 2015-09-19 VITALS — BP 128/76 | HR 87 | Ht 67.0 in | Wt 147.8 lb

## 2015-09-19 DIAGNOSIS — I10 Essential (primary) hypertension: Secondary | ICD-10-CM

## 2015-09-19 DIAGNOSIS — I4821 Permanent atrial fibrillation: Secondary | ICD-10-CM

## 2015-09-19 DIAGNOSIS — I482 Chronic atrial fibrillation: Secondary | ICD-10-CM | POA: Diagnosis not present

## 2015-09-19 DIAGNOSIS — I442 Atrioventricular block, complete: Secondary | ICD-10-CM

## 2015-09-19 LAB — CBC
HCT: 39.3 % (ref 35.0–45.0)
HEMOGLOBIN: 12.5 g/dL (ref 11.7–15.5)
MCH: 26.4 pg — AB (ref 27.0–33.0)
MCHC: 31.8 g/dL — ABNORMAL LOW (ref 32.0–36.0)
MCV: 82.9 fL (ref 80.0–100.0)
MPV: 9.7 fL (ref 7.5–12.5)
Platelets: 253 10*3/uL (ref 140–400)
RBC: 4.74 MIL/uL (ref 3.80–5.10)
RDW: 17.9 % — ABNORMAL HIGH (ref 11.0–15.0)
WBC: 6.6 10*3/uL (ref 3.8–10.8)

## 2015-09-19 LAB — CUP PACEART INCLINIC DEVICE CHECK
Date Time Interrogation Session: 20170629111834
Implantable Lead Implant Date: 20050927
Implantable Lead Model: 5076
MDC IDC LEAD IMPLANT DT: 20050927
MDC IDC LEAD LOCATION: 753859
MDC IDC LEAD LOCATION: 753860

## 2015-09-19 LAB — BASIC METABOLIC PANEL
BUN: 12 mg/dL (ref 7–25)
CALCIUM: 9.6 mg/dL (ref 8.6–10.4)
CHLORIDE: 101 mmol/L (ref 98–110)
CO2: 25 mmol/L (ref 20–31)
Creat: 0.63 mg/dL (ref 0.60–0.88)
GLUCOSE: 91 mg/dL (ref 65–99)
Potassium: 4.2 mmol/L (ref 3.5–5.3)
SODIUM: 134 mmol/L — AB (ref 135–146)

## 2015-09-19 NOTE — Patient Instructions (Addendum)
Medication Instructions:   Your physician recommends that you continue on your current medications as directed. Please refer to the Current Medication list given to you today.   If you need a refill on your cardiac medications before your next appointment, please call your pharmacy.  Labwork: CBC BMET TODAY    Testing/Procedures: NONE ORDER TODAY    Follow-Up:   Your physician wants you to follow-up in:  IN 6   MONTHS WITH DR Johney FrameALLRED.  You will receive a reminder letter in the mail two months in advance. If you don't receive a letter, please call our office to schedule the follow-up appointment.      Any Other Special Instructions Will Be Listed Below (If Applicable).

## 2015-09-23 ENCOUNTER — Telehealth: Payer: Self-pay | Admitting: *Deleted

## 2015-09-23 NOTE — Telephone Encounter (Signed)
-----   Message from Marily LenteAmber K Seiler, NP sent at 09/19/2015  4:32 PM EDT ----- Please notify patient of stable labs

## 2015-12-07 ENCOUNTER — Other Ambulatory Visit: Payer: Self-pay | Admitting: Internal Medicine

## 2016-02-03 ENCOUNTER — Telehealth (HOSPITAL_COMMUNITY): Payer: Self-pay

## 2016-02-03 NOTE — Telephone Encounter (Signed)
Daughter calling CHF clinic triage to report patient who has not been seen in a few years with Dr. Gala RomneyBensimhon has had trouble breathing for a few days. Reports patient has been SOB, "whistles" when she breathes, slurring speech, very fatigued, and has been sleeping on and off past day. Advised to go to ED to be evaluated as her breathing/perfusion appears compromised and worry about adequate oxygenation with these issues.  Advised if she can not safely transport patient or worry that she may be worsening in her condition to call 911. Aware and agreeable to plan as stated above.  Will keep patient's 12/20 apt for now.  Ave FilterBradley, Megan Genevea, RN

## 2016-02-06 ENCOUNTER — Encounter (HOSPITAL_COMMUNITY): Payer: Self-pay | Admitting: Internal Medicine

## 2016-02-06 ENCOUNTER — Ambulatory Visit (HOSPITAL_COMMUNITY)
Admission: RE | Admit: 2016-02-06 | Discharge: 2016-02-06 | Disposition: A | Discharge status: Home / Self Care | Payer: Medicare Other | Source: Ambulatory Visit | Attending: Internal Medicine | Admitting: Internal Medicine

## 2016-02-06 VITALS — BP 130/82 | HR 63 | Wt 138.0 lb

## 2016-02-06 DIAGNOSIS — I517 Cardiomegaly: Secondary | ICD-10-CM | POA: Diagnosis not present

## 2016-02-06 DIAGNOSIS — K449 Diaphragmatic hernia without obstruction or gangrene: Secondary | ICD-10-CM | POA: Insufficient documentation

## 2016-02-06 DIAGNOSIS — R0600 Dyspnea, unspecified: Secondary | ICD-10-CM | POA: Diagnosis not present

## 2016-02-06 DIAGNOSIS — I482 Chronic atrial fibrillation: Secondary | ICD-10-CM | POA: Diagnosis not present

## 2016-02-06 DIAGNOSIS — I4891 Unspecified atrial fibrillation: Secondary | ICD-10-CM | POA: Diagnosis not present

## 2016-02-06 DIAGNOSIS — I4821 Permanent atrial fibrillation: Secondary | ICD-10-CM

## 2016-02-06 DIAGNOSIS — Z950 Presence of cardiac pacemaker: Secondary | ICD-10-CM | POA: Diagnosis not present

## 2016-02-06 DIAGNOSIS — I5031 Acute diastolic (congestive) heart failure: Secondary | ICD-10-CM | POA: Diagnosis not present

## 2016-02-06 MED ORDER — FUROSEMIDE 20 MG PO TABS: 20.0000 mg | ORAL_TABLET | Freq: Every day | ORAL | 3 refills | Status: DC

## 2016-02-06 MED ORDER — POTASSIUM CHLORIDE CRYS ER 20 MEQ PO TBCR: 20.0000 meq | EXTENDED_RELEASE_TABLET | Freq: Every day | ORAL | 3 refills | Status: DC

## 2016-02-06 NOTE — Progress Notes (Signed)
    ReDS Vest - 02/06/16 1500      ReDS Vest   MR  No   Estimated volume prior to reading High   Fitting Posture Standing   Height Marker Tall   Ruler Value 15   Center Strip Aligned   ReDS Value 40

## 2016-02-06 NOTE — Patient Instructions (Signed)
Start taking Furosomide 20 mg (1 Tab) once Daily  Start taking Potassium 20 mEq (1 Tab) once Daily  Chest Xray and Echo have been ordered   Follow up in 1 month

## 2016-02-06 NOTE — Progress Notes (Signed)
ADVANCED HEART FAILURE CLINIC CONSULT NOTE  Patient ID: Randel PiggCharlotte Mchan, female   DOB: 15-Jan-1925, 82 y.o.   MRN: [id number]  HPI:  Claris GowerCharlotte is an 82 y/o woman with a h/o HTN, HL, permanent AF, CHB s/p pacer and non-obstructive CAD by cath in 2007 with EF 55-60%. Whom we last saw in 2013 who is referred back by Dr. Evlyn [facility]annerSouth for possible HF  She has been followed by Dr. Johney FrameAllred in [facility]. She is now in permanent atrial fibrillation.  She was tried on increased Flecainide with persistence of AF and didn't want to try DCCV with few symptoms.  Says she has been doing well but her family has noticed more of labored breathing at rest and with activity. She says that she just doesn't have "that surge of energy" that she used to have. She says she can do all ADls but her children clarify that she has to take more rest breaks. No CP. No orthopnea or PND. Minimal LE edema. By our scales she has lost almost 10 pounds. Had labs on Tuesday with Dr. Evlyn [place]anner[facility] and was instructed to start lasix 20mg  daily. Has not started that.    Last echo 3/13 EF 55% moderate to severe biatrial enlargement mild TR  Cath 2007 1. Left Main: Normal.  2. LAD: Mild proximal luminal irregularities with mid 40 to 50% stenosis  at bifurcation with first septal perforator. Distal vessel has mild  luminal irregularity.  3. D1: Moderate size, high branching, with mild luminal irregularities.  4. LCX: Nondominant with mild luminal irregularities.  5. RCA: Dominant with mild luminal irregularity.  6. PDA and PLV: Moderate size vessels and mild luminal irregularity.  7. LV: EF is 55 to 60%. No significant wall motion abnormalities. LVEDP  was 9-mmHg.     ROS: All systems negative except as listed in HPI, PMH and Problem List.  Past Medical History:  Diagnosis Date  . Atrial flutter (HCC)    a. Noted during pacemaker implantation 2011, spontaneously terminated.  . Biatrial enlargement    severe  . CAD (coronary artery  disease)    a. Nonobstructive by cath 2005 - 40-50% mid LAD. b. Nonischemic nuc 05/2011.  . Carotid artery disease (HCC)    a. 1-50% bilateral (upper end) - March 2011.  Marland Kitchen. DJD (degenerative joint disease)   . GERD (gastroesophageal reflux disease)   . Hyperlipidemia   . Hypertension   . Hypothyroid   . Persistent atrial fibrillation (HCC) 05/2011  . Second degree heart block    a. s/p PPM 2005. b. Gen change to [employer] by Dr Johney FrameAllred  9/11 for premature ERI 11/2009.    Current Outpatient Prescriptions  Medication Sig Dispense Refill  . amLODipine (NORVASC) 5 MG tablet TAKE 1 TABLET BY MOUTH DAILY 30 tablet 8  . BYSTOLIC 5 MG tablet TAKE 1/2 TABLET BY MOUTH DAILY 15 tablet 9  . fish oil-omega-3 fatty acids 1000 MG capsule Take 2 g by mouth daily.     . Garlic 1000 MG CAPS Take 2 capsules by mouth daily.      Marland Kitchen. latanoprost (XALATAN) 0.005 % ophthalmic solution Place 1 drop into both eyes at bedtime.      Marland Kitchen. levothyroxine (SYNTHROID, LEVOTHROID) 88 MCG tablet Take 1 mcg by mouth daily.     . Multiple Vitamins-Minerals (CENTRUM SILVER PO) Take 1 tablet by mouth daily.      Marland Kitchen. omeprazole (PRILOSEC) 20 MG capsule Take 20 mg by mouth 2 (two) times daily.     .Marland Kitchen  potassium chloride SA (K-DUR,KLOR-CON) 20 MEQ tablet TAKE 1 TABLET BY MOUTH ON MONDAY,WEDNESDAY AND FRIDAY 45 tablet 1  . timolol (BETIMOL) 0.5 % ophthalmic solution Place 1 drop into both eyes every morning.     . triamcinolone cream (KENALOG) 0.1 % Apply 1 application topically as directed.    Carlena Hurl. XARELTO 20 MG TABS tablet TAKE 1 TABLET BY MOUTH DAILY WITH SUPPER 30 tablet 9  . furosemide (LASIX) 20 MG tablet Take 20 mg by mouth daily as needed (swelling).     No current facility-administered medications for this encounter.    Wt Readings from Last 3 Encounters:  02/06/16 138 lb (62.6 kg)  09/19/15 147 lb 12.8 oz (67 kg)  06/12/15 151 lb 12.8 oz (68.9 kg)      PHYSICAL EXAM: Vitals:   02/06/16 1403  BP: 130/82  Pulse: 63    Weight change:  General:  Elderly woman in WC HEENT: normal Neck: supple. JVP to jaw with prominent CV waves. Carotids 2+ bilaterally; no bruits. No lymphadenopathy or thryomegaly appreciated. Cor: PMI normal. Iregular rate & rhythm. 2/6 TR Lungs: clear with slight crackles in right abuse Abdomen: soft, nontender, nondistended. No hepatosplenomegaly. No bruits or masses. Good bowel sounds. Extremities: no cyanosis, clubbing, rash, edema Neuro: alert & orientedx3, cranial nerves grossly intact. Moves all 4 extremities w/o difficulty. Affect pleasant.   ASSESSMENT & PLAN:  1. Permanent AF   --CHADSVASC =5 2. CHB s/p PPM 3. Non-obstructive CAD by cath 2007 4. Dyspnea = acute on chronic diastolic HF  On exam she appears to have volume overload and I suspect this is what is making her short of breath. Based on previous echo and progression of AF, I suspect she likely has restrictive CM. Agree with lasix 20 mg daily. Will also need potassium 20 meq daily to start. We will likely have to adjust her diuretics a bit to find the right dose. Will check ReDS Vest, echo and CXR.   RTC in 4 weeks.   Bensimhon, Daniel,MD 2:41 PM  Addendum:  ReDS = 40% confirms volume overload.   D/w Dr. Evlyn KannerSouth.   Bensimhon, Daniel,MD 10:40 PM

## 2016-03-03 ENCOUNTER — Encounter: Payer: Self-pay | Admitting: Internal Medicine

## 2016-03-05 ENCOUNTER — Ambulatory Visit (HOSPITAL_COMMUNITY)
Admission: RE | Admit: 2016-03-05 | Discharge: 2016-03-05 | Disposition: A | Discharge status: Home / Self Care | Payer: Medicare Other | Source: Ambulatory Visit | Attending: Endocrinology | Admitting: Endocrinology

## 2016-03-05 DIAGNOSIS — I4891 Unspecified atrial fibrillation: Secondary | ICD-10-CM

## 2016-03-05 DIAGNOSIS — R0600 Dyspnea, unspecified: Secondary | ICD-10-CM | POA: Insufficient documentation

## 2016-03-05 DIAGNOSIS — I083 Combined rheumatic disorders of mitral, aortic and tricuspid valves: Secondary | ICD-10-CM | POA: Diagnosis not present

## 2016-03-05 DIAGNOSIS — I5033 Acute on chronic diastolic (congestive) heart failure: Secondary | ICD-10-CM | POA: Diagnosis not present

## 2016-03-05 NOTE — Progress Notes (Signed)
  Echocardiogram 2D Echocardiogram has been performed.  Arvil ChacoFoster, Rajvi Armentor 03/05/2016, 12:13 PM

## 2016-03-11 ENCOUNTER — Ambulatory Visit (HOSPITAL_COMMUNITY)
Admission: RE | Admit: 2016-03-11 | Discharge: 2016-03-11 | Disposition: A | Discharge status: Home / Self Care | Payer: Medicare Other | Source: Ambulatory Visit | Attending: Internal Medicine | Admitting: Internal Medicine

## 2016-03-11 VITALS — BP 136/80 | HR 66 | Wt 164.4 lb

## 2016-03-11 DIAGNOSIS — E785 Hyperlipidemia, unspecified: Secondary | ICD-10-CM | POA: Diagnosis not present

## 2016-03-11 DIAGNOSIS — R0600 Dyspnea, unspecified: Secondary | ICD-10-CM | POA: Diagnosis not present

## 2016-03-11 DIAGNOSIS — I4821 Permanent atrial fibrillation: Secondary | ICD-10-CM

## 2016-03-11 DIAGNOSIS — I5032 Chronic diastolic (congestive) heart failure: Secondary | ICD-10-CM

## 2016-03-11 DIAGNOSIS — Z7901 Long term (current) use of anticoagulants: Secondary | ICD-10-CM | POA: Insufficient documentation

## 2016-03-11 DIAGNOSIS — I481 Persistent atrial fibrillation: Secondary | ICD-10-CM | POA: Insufficient documentation

## 2016-03-11 DIAGNOSIS — I2510 Atherosclerotic heart disease of native coronary artery without angina pectoris: Secondary | ICD-10-CM | POA: Diagnosis not present

## 2016-03-11 DIAGNOSIS — M1990 Unspecified osteoarthritis, unspecified site: Secondary | ICD-10-CM | POA: Diagnosis not present

## 2016-03-11 DIAGNOSIS — I4892 Unspecified atrial flutter: Secondary | ICD-10-CM | POA: Insufficient documentation

## 2016-03-11 DIAGNOSIS — I5033 Acute on chronic diastolic (congestive) heart failure: Secondary | ICD-10-CM | POA: Insufficient documentation

## 2016-03-11 DIAGNOSIS — E039 Hypothyroidism, unspecified: Secondary | ICD-10-CM | POA: Diagnosis not present

## 2016-03-11 DIAGNOSIS — I517 Cardiomegaly: Secondary | ICD-10-CM | POA: Diagnosis not present

## 2016-03-11 DIAGNOSIS — R0683 Snoring: Secondary | ICD-10-CM | POA: Diagnosis not present

## 2016-03-11 DIAGNOSIS — I482 Chronic atrial fibrillation: Secondary | ICD-10-CM

## 2016-03-11 DIAGNOSIS — Z950 Presence of cardiac pacemaker: Secondary | ICD-10-CM

## 2016-03-11 DIAGNOSIS — K219 Gastro-esophageal reflux disease without esophagitis: Secondary | ICD-10-CM | POA: Diagnosis not present

## 2016-03-11 DIAGNOSIS — I110 Hypertensive heart disease with heart failure: Secondary | ICD-10-CM | POA: Diagnosis not present

## 2016-03-11 LAB — BASIC METABOLIC PANEL
ANION GAP: 7 (ref 5–15)
BUN: 8 mg/dL (ref 6–20)
CALCIUM: 8.8 mg/dL — AB (ref 8.9–10.3)
CO2: 23 mmol/L (ref 22–32)
Chloride: 97 mmol/L — ABNORMAL LOW (ref 101–111)
Creatinine, Ser: 0.58 mg/dL (ref 0.44–1.00)
GLUCOSE: 198 mg/dL — AB (ref 65–99)
POTASSIUM: 4.1 mmol/L (ref 3.5–5.1)
SODIUM: 127 mmol/L — AB (ref 135–145)

## 2016-03-11 LAB — BRAIN NATRIURETIC PEPTIDE: B NATRIURETIC PEPTIDE 5: 570.5 pg/mL — AB (ref 0.0–100.0)

## 2016-03-11 MED ORDER — FUROSEMIDE 40 MG PO TABS: 40.0000 mg | ORAL_TABLET | Freq: Every day | ORAL | 3 refills | Status: DC

## 2016-03-11 NOTE — Patient Instructions (Signed)
Increase Furosemide to 40 mg daily, can take extra as needed in PM  Labs today  Your physician recommends that you schedule a follow-up appointment in: 4 weeks

## 2016-03-11 NOTE — Progress Notes (Signed)
Advanced Heart Failure Clinic Note   Patient ID: Beth Santiago, female   DOB: 1924/08/19, 82 y.o.   MRN: [id number]  HPI:  Beth Santiago is an 82 y/o woman with a h/o HTN, HL, permanent AF, CHB s/p pacer and non-obstructive CAD by cath in 2007 with EF 55-60%. Whom we last saw in 2013 who is referred back by Dr. Evlyn [facility]anner[facility] for possible HF  She has been followed by Dr. Johney FrameAllred in [facility]. She is now in permanent atrial fibrillation.  She was tried on increased Flecainide with persistence of AF and didn't want to try DCCV with few symptoms.  She presents today for follow up. At last visit, confirmed dose of 20 mg daily lasix and added 20 meq of potassium. ReDs vest at that visit 40% consistent with volume overload.  Today scale shows that she is up 26 lbs. Think that most likely last weight was inaccurate.   Overall she doesn't think she can tell a difference in her breathing. Mostly just doesn't have much energy.  Per daughter, pt is "much better". Able to get around better and work of breathing greatly improved.   Able to sleep much better but still snores. Drinks "a lot" of water. Unsure of home weight. Has not been weighing daily.  Echo 03/05/16 LVEF 55-60%, no WMA, mild AI, Mod LAE, Severe TR, Trivial PI. PA peak pressure 35 mm Hg  CXR 02/06/16 with no acute cardiopulmonary disease. + Mild cardiomegaly.  Echo 3/13 EF 55% moderate to severe biatrial enlargement mild TR   Cath 2007 1. Left Main: Normal.  2. LAD: Mild proximal luminal irregularities with mid 40 to 50% stenosis  at bifurcation with first septal perforator. Distal vessel has mild  luminal irregularity.  3. D1: Moderate size, high branching, with mild luminal irregularities.  4. LCX: Nondominant with mild luminal irregularities.  5. RCA: Dominant with mild luminal irregularity.  6. PDA and PLV: Moderate size vessels and mild luminal irregularity.  7. LV: EF is 55 to 60%. No significant wall motion abnormalities. LVEDP  was  9-mmHg.     ROS: All systems negative except as listed in HPI, PMH and Problem List.  Past Medical History:  Diagnosis Date  . Atrial flutter (HCC)    a. Noted during pacemaker implantation 2011, spontaneously terminated.  . Biatrial enlargement    severe  . CAD (coronary artery disease)    a. Nonobstructive by cath 2005 - 40-50% mid LAD. b. Nonischemic nuc 05/2011.  . Carotid artery disease (HCC)    a. 1-50% bilateral (upper end) - March 2011.  Marland Kitchen. DJD (degenerative joint disease)   . GERD (gastroesophageal reflux disease)   . Hyperlipidemia   . Hypertension   . Hypothyroid   . Persistent atrial fibrillation (HCC) 05/2011  . Second degree heart block    a. s/p PPM 2005. b. Gen change to [employer] by Dr Johney FrameAllred  9/11 for premature ERI 11/2009.    Current Outpatient Prescriptions  Medication Sig Dispense Refill  . amLODipine (NORVASC) 5 MG tablet TAKE 1 TABLET BY MOUTH DAILY 30 tablet 8  . BYSTOLIC 5 MG tablet TAKE 1/2 TABLET BY MOUTH DAILY 15 tablet 9  . fish oil-omega-3 fatty acids 1000 MG capsule Take 2 g by mouth daily.     . furosemide (LASIX) 20 MG tablet Take 1 tablet (20 mg total) by mouth daily. 30 tablet 3  . Garlic 1000 MG CAPS Take 2 capsules by mouth daily.      Marland Kitchen. latanoprost (XALATAN)  0.005 % ophthalmic solution Place 1 drop into both eyes at bedtime.      Marland Kitchen. levothyroxine (SYNTHROID, LEVOTHROID) 88 MCG tablet Take 1 mcg by mouth daily.     . Multiple Vitamins-Minerals (CENTRUM SILVER PO) Take 1 tablet by mouth daily.      Marland Kitchen. omeprazole (PRILOSEC) 20 MG capsule Take 20 mg by mouth 2 (two) times daily.     . potassium chloride SA (K-DUR,KLOR-CON) 20 MEQ tablet Take 1 tablet (20 mEq total) by mouth daily. 30 tablet 3  . rosuvastatin (CRESTOR) 20 MG tablet Take 10 mg by mouth 3 (three) times a week. Tue, Thur and Sat    . timolol (BETIMOL) 0.5 % ophthalmic solution Place 1 drop into both eyes every morning.     . triamcinolone cream (KENALOG) 0.1 % Apply 1 application  topically as directed.    Carlena Hurl. XARELTO 20 MG TABS tablet TAKE 1 TABLET BY MOUTH DAILY WITH SUPPER 30 tablet 9   No current facility-administered medications for this encounter.    Vitals:   03/11/16 1206  BP: 136/80  Pulse: 66  Weight: 164 lb 6.4 oz (74.6 kg)   Wt Readings from Last 3 Encounters:  03/11/16 164 lb 6.4 oz (74.6 kg)  02/06/16 138 lb (62.6 kg)  09/19/15 147 lb 12.8 oz (67 kg)   PHYSICAL EXAM: General:  Elderly. Walked into clinic with walker.  HEENT: Normal Neck: supple. JVP remains elevated to jaw with prominent CV waves. Carotids 2+ bilaterally; no bruits. No thyromegaly or nodule noted.  Cor: PMI normal. Iregular rate & rhythm. 2/6 HR Lungs: CTAB, normal effort. Much improved from last visit. Abdomen: soft, NT, ND, no HSM. No bruits or masses. +BS  Extremities: no cyanosis, clubbing, rash, Trace ankle edema at most. Neuro: alert & orientedx3, cranial nerves grossly intact. Moves all 4 extremities w/o difficulty. Affect pleasant.  ASSESSMENT & PLAN:  1. Chronic diastolic CHF 2. Dyspnea 3. Permanent AF   --CHADSVASC =5 4. CHB s/p PPM 5. Non-obstructive CAD by cath 2007 6. Snoring  Weight inacurate.  She has improved from a symptomatic standpoint, but remains volume overloaded. ReDS Vest 39%.   Increase lasix to 40 mg DAILY. Do not split dose. Reinforced the use of sliding scale diuretics. Can take an extra 40 mg as needed for weight gain of 2-3 lbs overnight, or 5 lbs within one week.  Has had significant snoring for years.  Will consider for Sleep Study once diuresed.  Reinforced fluid restriction to < 2 L daily, sodium restriction to less than 2000 mg daily, and the importance of daily weights.    Graciella FreerMichael Andrew Tillery, PA-C 12:18 PM  Patient seen and examined with Otilio SaberAndy Tillery, PA-C. We discussed all aspects of the encounter. I agree with the assessment and plan as stated above.   She is symptomatically improved but remains volume overloaded on exam and  by ReDS. Will increase lasix to 40 daily. Discussed need for fluid restriction and sliding scale lasix. I reviewed cho personally with her and her daughter and suspect she has advanced diastolic dysfunction with restrictive cardiomyopathy. Will see back in several weeks. Will likely need sleep study in snoring persists after further diuresis.   Total time spent 40 minutes. Over half that time spent discussing above.   Bensimhon, Daniel,MD 11:18 PM

## 2016-03-11 NOTE — Progress Notes (Signed)
    ReDS Vest - 03/11/16 1200      ReDS Vest   MR  No   Fitting Posture Standing   Height Marker Tall   Ruler Value 13   Center Strip Aligned   ReDS Value 39

## 2016-03-13 ENCOUNTER — Other Ambulatory Visit (HOSPITAL_COMMUNITY): Payer: Self-pay | Admitting: Internal Medicine

## 2016-04-08 ENCOUNTER — Encounter (HOSPITAL_COMMUNITY): Payer: Medicare Other | Admitting: Internal Medicine

## 2016-04-30 ENCOUNTER — Other Ambulatory Visit: Payer: Self-pay | Admitting: Internal Medicine

## 2016-05-08 ENCOUNTER — Ambulatory Visit (HOSPITAL_COMMUNITY)
Admission: RE | Admit: 2016-05-08 | Discharge: 2016-05-08 | Disposition: A | Discharge status: Home / Self Care | Payer: Medicare Other | Source: Ambulatory Visit | Attending: Internal Medicine | Admitting: Internal Medicine

## 2016-05-08 VITALS — BP 130/70 | HR 75 | Wt 165.0 lb

## 2016-05-08 DIAGNOSIS — I2510 Atherosclerotic heart disease of native coronary artery without angina pectoris: Secondary | ICD-10-CM | POA: Diagnosis not present

## 2016-05-08 DIAGNOSIS — I441 Atrioventricular block, second degree: Secondary | ICD-10-CM | POA: Diagnosis not present

## 2016-05-08 DIAGNOSIS — I4821 Permanent atrial fibrillation: Secondary | ICD-10-CM

## 2016-05-08 DIAGNOSIS — K219 Gastro-esophageal reflux disease without esophagitis: Secondary | ICD-10-CM | POA: Diagnosis not present

## 2016-05-08 DIAGNOSIS — I10 Essential (primary) hypertension: Secondary | ICD-10-CM

## 2016-05-08 DIAGNOSIS — Z950 Presence of cardiac pacemaker: Secondary | ICD-10-CM | POA: Insufficient documentation

## 2016-05-08 DIAGNOSIS — I442 Atrioventricular block, complete: Secondary | ICD-10-CM

## 2016-05-08 DIAGNOSIS — R0600 Dyspnea, unspecified: Secondary | ICD-10-CM | POA: Diagnosis not present

## 2016-05-08 DIAGNOSIS — I517 Cardiomegaly: Secondary | ICD-10-CM | POA: Insufficient documentation

## 2016-05-08 DIAGNOSIS — I5032 Chronic diastolic (congestive) heart failure: Secondary | ICD-10-CM

## 2016-05-08 DIAGNOSIS — M1990 Unspecified osteoarthritis, unspecified site: Secondary | ICD-10-CM | POA: Diagnosis not present

## 2016-05-08 DIAGNOSIS — I482 Chronic atrial fibrillation: Secondary | ICD-10-CM | POA: Diagnosis not present

## 2016-05-08 DIAGNOSIS — R0683 Snoring: Secondary | ICD-10-CM | POA: Insufficient documentation

## 2016-05-08 DIAGNOSIS — E785 Hyperlipidemia, unspecified: Secondary | ICD-10-CM | POA: Diagnosis not present

## 2016-05-08 DIAGNOSIS — E039 Hypothyroidism, unspecified: Secondary | ICD-10-CM | POA: Insufficient documentation

## 2016-05-08 LAB — BASIC METABOLIC PANEL
ANION GAP: 9 (ref 5–15)
BUN: 10 mg/dL (ref 6–20)
CO2: 25 mmol/L (ref 22–32)
Calcium: 9.3 mg/dL (ref 8.9–10.3)
Chloride: 99 mmol/L — ABNORMAL LOW (ref 101–111)
Creatinine, Ser: 0.62 mg/dL (ref 0.44–1.00)
GFR calc Af Amer: >60 mL/min (ref >60)
Glucose, Bld: 133 mg/dL — ABNORMAL HIGH (ref 65–99)
POTASSIUM: 3.6 mmol/L (ref 3.5–5.1)
SODIUM: 133 mmol/L — AB (ref 135–145)

## 2016-05-08 NOTE — Progress Notes (Signed)
Advanced Heart Failure Clinic Note   Patient ID: Beth PiggCharlotte Santiago, female   DOB: [birth year], 83 y.o.   MRN: [id number]  HPI:  Claris GowerCharlotte is an 83 y/o woman with a h/o HTN, HL, permanent AF, CHB s/p pacer and non-obstructive CAD by cath in 2007 with EF 55-60%. Whom we last saw in 2013 who is referred back by Dr. Evlyn [facility]anner[facility] for possible HF  She has been followed by Dr. Johney FrameAllred in [facility]. She is now in permanent atrial fibrillation.  She was tried on increased Flecainide with persistence of AF and didn't want to try DCCV with few symptoms.  She presents today for follow up. At last visit, lasix increased to 40 mg daily. Weight up 1 lb from last visit. ReDs vest reading at that visit 39%. Weight at home stable from 159 - 162. Continues to feel better, thinks her diuresis has helped.  Not very active exercise wise.  Denies any DOE since last visit, mostly limited by arthritis anything else. No orthopnea, bendopnea, lightheadedness, or dizziness. No CP. Trying to watch her fluid intake and salt intake.   Echo 03/05/16 LVEF 55-60%, no WMA, mild AI, Mod LAE, Severe TR, Trivial PI. PA peak pressure 35 mm Hg  CXR 02/06/16 with no acute cardiopulmonary disease. + Mild cardiomegaly.  Echo 3/13 EF 55% moderate to severe biatrial enlargement mild TR   Cath 2007 1. Left Main: Normal.  2. LAD: Mild proximal luminal irregularities with mid 40 to 50% stenosis  at bifurcation with first septal perforator. Distal vessel has mild  luminal irregularity.  3. D1: Moderate size, high branching, with mild luminal irregularities.  4. LCX: Nondominant with mild luminal irregularities.  5. RCA: Dominant with mild luminal irregularity.  6. PDA and PLV: Moderate size vessels and mild luminal irregularity.  7. LV: EF is 55 to 60%. No significant wall motion abnormalities. LVEDP  was 9-mmHg.     ROS: All systems negative except as listed in HPI, PMH and Problem List.  Past Medical History:  Diagnosis Date  .  Atrial flutter (HCC)    a. Noted during pacemaker implantation 2011, spontaneously terminated.  . Biatrial enlargement    severe  . CAD (coronary artery disease)    a. Nonobstructive by cath 2005 - 40-50% mid LAD. b. Nonischemic nuc 05/2011.  . Carotid artery disease (HCC)    a. 1-50% bilateral (upper end) - March 2011.  Marland Kitchen. DJD (degenerative joint disease)   . GERD (gastroesophageal reflux disease)   . Hyperlipidemia   . Hypertension   . Hypothyroid   . Persistent atrial fibrillation (HCC) 05/2011  . Second degree heart block    a. s/p PPM 2005. b. Gen change to [employer] by Dr Johney FrameAllred  9/11 for premature ERI 11/2009.    Current Outpatient Prescriptions  Medication Sig Dispense Refill  . amLODipine (NORVASC) 5 MG tablet TAKE 1 TABLET BY MOUTH DAILY 30 tablet 8  . BYSTOLIC 5 MG tablet TAKE 1/2 TABLET BY MOUTH DAILY 15 tablet 9  . fish oil-omega-3 fatty acids 1000 MG capsule Take 2 g by mouth daily.     . furosemide (LASIX) 40 MG tablet Take 1 tablet (40 mg total) by mouth daily. 40 tablet 3  . Garlic 1000 MG CAPS Take 2 capsules by mouth daily.      Marland Kitchen. latanoprost (XALATAN) 0.005 % ophthalmic solution Place 1 drop into both eyes at bedtime.      Marland Kitchen. levothyroxine (SYNTHROID, LEVOTHROID) 88 MCG tablet Take 1 mcg by mouth  daily.     . Multiple Vitamins-Minerals (CENTRUM SILVER PO) Take 1 tablet by mouth daily.      Marland Kitchen omeprazole (PRILOSEC) 20 MG capsule Take 20 mg by mouth 2 (two) times daily.     . potassium chloride SA (K-DUR,KLOR-CON) 20 MEQ tablet Take 1 tablet (20 mEq total) by mouth daily. 30 tablet 3  . rosuvastatin (CRESTOR) 20 MG tablet Take 10 mg by mouth 3 (three) times a week. Tue, Thur and Sat    . timolol (BETIMOL) 0.5 % ophthalmic solution Place 1 drop into both eyes every morning.     . triamcinolone cream (KENALOG) 0.1 % Apply 1 application topically as directed.    Carlena Hurl 20 MG TABS tablet TAKE 1 TABLET BY MOUTH DAILY WITH SUPPER 30 tablet 5   No current  facility-administered medications for this encounter.    Vitals:   05/08/16 1211  BP: 130/70  Pulse: 75  SpO2: 97%  Weight: 165 lb (74.8 kg)   Wt Readings from Last 3 Encounters:  05/08/16 165 lb (74.8 kg)  03/11/16 164 lb 6.4 oz (74.6 kg)  02/06/16 138 lb (62.6 kg)   PHYSICAL EXAM: General:  Elderly but well appearing. Ambulated into clinic without difficulty using walker. Daughter present  HEENT: Normal Neck: supple. JVP 7-8 with mild HJR. Carotids 2+ bilaterally; no bruits. No thyromegaly or nodule noted.  Cor: PMI normal. Iregular rate & rhythm. 2/6 HR Lungs: Clear, normal effort Abdomen: soft, NT, ND, no HSM. No bruits or masses. +BS  Extremities: no cyanosis, clubbing, rash, No peripheral edema.  Neuro: alert & orientedx3, cranial nerves grossly intact. Moves all 4 extremities w/o difficulty. Affect pleasant.  ASSESSMENT & PLAN:  1. Chronic diastolic CHF 2. Dyspnea 3. Permanent AF   --CHADSVASC =5 4. CHB s/p PPM 5. Non-obstructive CAD by cath 2007 6. Snoring  Weight stable from last visit.  Much improved symptomatically. NYHA class 2 symptoms.   Continue lasix 40 mg daily.  Can take extra 40 mg as needed for weight gain 3 lbs overnight, 5 lbs within one week, or weights of 164 lbs or more. Reinforced fluid restriction to < 2 L daily, sodium restriction to less than 2000 mg daily, and the importance of daily weights.    Has had significant snoring for years.  Could eventually consider Sleep Study.  Graciella Freer, PA-C 12:18 PM   Patient seen and examined with Otilio Saber, PA-C. We discussed all aspects of the encounter. I agree with the assessment and plan as stated above.   She is here with her daughter today who continues to help follow her quite closely with daily weights. Symptoms much improved. Volume status looks to still be mildly elevated on exam but she is completely asymptomatic. Given her age it will be particularly important to avoid  overdiuresis. Will continue current regimen. Chronic AF is stable. Repeat BMET to make sure we do not need to reduce dose of DOAC.   Bensimhon, Daniel,MD 10:22 PM

## 2016-05-08 NOTE — Patient Instructions (Signed)
Labs today (will call for abnormal results, otherwise no news is good news)  Take an extra 40 mg of Lasix for weight gain of 3 lb overnight, 5 lbs in a week, OR weight over 164 lbs.   Follow up in 3 Months

## 2016-05-21 DIAGNOSIS — R042 Hemoptysis: Secondary | ICD-10-CM

## 2016-05-21 HISTORY — DX: Hemoptysis: R04.2

## 2016-06-17 ENCOUNTER — Encounter (HOSPITAL_COMMUNITY): Payer: Self-pay | Admitting: Nurse Practitioner

## 2016-06-17 ENCOUNTER — Emergency Department (HOSPITAL_COMMUNITY): Payer: Medicare Other

## 2016-06-17 ENCOUNTER — Emergency Department (HOSPITAL_COMMUNITY)
Admission: EM | Admit: 2016-06-17 | Discharge: 2016-06-17 | Disposition: A | Discharge status: Home / Self Care | Payer: Medicare Other | Source: Home / Self Care | Attending: Emergency Medicine | Admitting: Emergency Medicine

## 2016-06-17 DIAGNOSIS — E039 Hypothyroidism, unspecified: Secondary | ICD-10-CM

## 2016-06-17 DIAGNOSIS — A419 Sepsis, unspecified organism: Secondary | ICD-10-CM | POA: Diagnosis not present

## 2016-06-17 DIAGNOSIS — Z950 Presence of cardiac pacemaker: Secondary | ICD-10-CM | POA: Insufficient documentation

## 2016-06-17 DIAGNOSIS — M2500 Hemarthrosis, unspecified joint: Secondary | ICD-10-CM

## 2016-06-17 DIAGNOSIS — M25061 Hemarthrosis, right knee: Secondary | ICD-10-CM | POA: Insufficient documentation

## 2016-06-17 DIAGNOSIS — Z79899 Other long term (current) drug therapy: Secondary | ICD-10-CM | POA: Insufficient documentation

## 2016-06-17 DIAGNOSIS — I110 Hypertensive heart disease with heart failure: Secondary | ICD-10-CM | POA: Insufficient documentation

## 2016-06-17 DIAGNOSIS — I5032 Chronic diastolic (congestive) heart failure: Secondary | ICD-10-CM

## 2016-06-17 DIAGNOSIS — I2510 Atherosclerotic heart disease of native coronary artery without angina pectoris: Secondary | ICD-10-CM

## 2016-06-17 DIAGNOSIS — R0602 Shortness of breath: Secondary | ICD-10-CM | POA: Diagnosis not present

## 2016-06-17 LAB — CBC WITH DIFFERENTIAL/PLATELET
BASOS ABS: 0 10*3/uL (ref 0.0–0.1)
BASOS PCT: 0 %
EOS PCT: 0 %
Eosinophils Absolute: 0 10*3/uL (ref 0.0–0.7)
HCT: 34.1 % — ABNORMAL LOW (ref 36.0–46.0)
Hemoglobin: 10.7 g/dL — ABNORMAL LOW (ref 12.0–15.0)
LYMPHS PCT: 13 %
Lymphs Abs: 1.2 10*3/uL (ref 0.7–4.0)
MCH: 23.9 pg — ABNORMAL LOW (ref 26.0–34.0)
MCHC: 31.4 g/dL (ref 30.0–36.0)
MCV: 76.1 fL — AB (ref 78.0–100.0)
Monocytes Absolute: 0.9 10*3/uL (ref 0.1–1.0)
Monocytes Relative: 10 %
Neutro Abs: 6.9 10*3/uL (ref 1.7–7.7)
Neutrophils Relative %: 77 %
PLATELETS: 219 10*3/uL (ref 150–400)
RBC: 4.48 MIL/uL (ref 3.87–5.11)
RDW: 19.7 % — ABNORMAL HIGH (ref 11.5–15.5)
WBC: 9 10*3/uL (ref 4.0–10.5)

## 2016-06-17 LAB — BASIC METABOLIC PANEL
ANION GAP: 8 (ref 5–15)
BUN: 15 mg/dL (ref 6–20)
CALCIUM: 9.4 mg/dL (ref 8.9–10.3)
CO2: 24 mmol/L (ref 22–32)
Chloride: 103 mmol/L (ref 101–111)
Creatinine, Ser: 0.62 mg/dL (ref 0.44–1.00)
Glucose, Bld: 159 mg/dL — ABNORMAL HIGH (ref 65–99)
POTASSIUM: 4.2 mmol/L (ref 3.5–5.1)
Sodium: 135 mmol/L (ref 135–145)

## 2016-06-17 LAB — SYNOVIAL CELL COUNT + DIFF, W/ CRYSTALS
Crystals, Fluid: NONE SEEN
Eosinophils-Synovial: 1 % (ref 0–1)
LYMPHOCYTES-SYNOVIAL FLD: 3 % (ref 0–20)
MONOCYTE-MACROPHAGE-SYNOVIAL FLUID: 0 % — AB (ref 50–90)
NEUTROPHIL, SYNOVIAL: 96 % — AB (ref 0–25)
WBC, Synovial: 23000 /mm3 — ABNORMAL HIGH (ref 0–200)

## 2016-06-17 LAB — C-REACTIVE PROTEIN

## 2016-06-17 LAB — SEDIMENTATION RATE: SED RATE: 16 mm/h (ref 0–22)

## 2016-06-17 MED ORDER — FENTANYL CITRATE (PF) 100 MCG/2ML IJ SOLN
25.0000 ug | Freq: Once | INTRAMUSCULAR | Status: AC
  Administered 2016-06-17: 25 ug via INTRAVENOUS
  Filled 2016-06-17: qty 2

## 2016-06-17 MED ORDER — TRAMADOL HCL 50 MG PO TABS
50.0000 mg | ORAL_TABLET | Freq: Once | ORAL | Status: AC
  Administered 2016-06-17: 50 mg via ORAL
  Filled 2016-06-17: qty 1

## 2016-06-17 MED ORDER — ONDANSETRON HCL 4 MG/2ML IJ SOLN
4.0000 mg | Freq: Once | INTRAMUSCULAR | Status: AC
  Administered 2016-06-17: 4 mg via INTRAVENOUS
  Filled 2016-06-17: qty 2

## 2016-06-17 MED ORDER — LIDOCAINE-EPINEPHRINE 1 %-1:100000 IJ SOLN
10.0000 mL | Freq: Once | INTRAMUSCULAR | Status: AC
  Administered 2016-06-17: 10 mL via INTRADERMAL
  Filled 2016-06-17: qty 10

## 2016-06-17 MED ORDER — FENTANYL CITRATE (PF) 100 MCG/2ML IJ SOLN
75.0000 ug | Freq: Once | INTRAMUSCULAR | Status: AC
  Administered 2016-06-17: 75 ug via INTRAVENOUS
  Filled 2016-06-17: qty 2

## 2016-06-17 MED ORDER — TRAMADOL HCL 50 MG PO TABS: 50.0000 mg | ORAL_TABLET | Freq: Four times a day (QID) | ORAL | 0 refills | Status: DC | PRN

## 2016-06-17 NOTE — ED Notes (Signed)
Gram stain resulted in no organisms but abundant white cells, predominately polymorphonuclear

## 2016-06-17 NOTE — ED Provider Notes (Signed)
[facility]-EMERGENCY DEPT Provider Note   CSN: [id number] Arrival date & time: 06/17/16  1234     History   Chief Complaint No chief complaint on file.   HPI Beth Santiago is a 83 y.o. female presenting with sudden onset right knee swelling in excruciating pain upon awakening this morning. She was in so much pain she could not get out of bed and had to call her daughter who came to try to help her to the bathroom. She even had difficulty using her walker and she had to be pushed on the desk rolling chair. She denies any trauma or injury to that knee. She denies redness or warmth, fever, chills or other symptoms. Denies history of DVT/PE, prolonged immobilization, recent surgery, hemoptysis, shortness of breath, chest pain or other symptoms.  HPI  Past Medical History:  Diagnosis Date  . Atrial flutter (HCC)    a. Noted during pacemaker implantation 2011, spontaneously terminated.  . Biatrial enlargement    severe  . CAD (coronary artery disease)    a. Nonobstructive by cath 2005 - 40-50% mid LAD. b. Nonischemic nuc 05/2011.  . Carotid artery disease (HCC)    a. 1-50% bilateral (upper end) - March 2011.  Marland Kitchen. DJD (degenerative joint disease)   . GERD (gastroesophageal reflux disease)   . Hyperlipidemia   . Hypertension   . Hypothyroid   . Persistent atrial fibrillation (HCC) 05/2011  . Second degree heart block    a. s/p PPM 2005. b. Gen change to Medtronic by Dr Johney FrameAllred  9/11 for premature ERI 11/2009.    Patient Active Problem List   Diagnosis Date Noted  . Chronic diastolic CHF (congestive heart failure) (HCC) 03/11/2016  . Snoring 03/11/2016  . Dizziness 03/26/2014  . Fatigue 12/09/2012  . Precordial chest pain 12/09/2012  . Dyspnea 06/11/2011  . Atrial fibrillation (HCC) 06/11/2011  . HYPERLIPIDEMIA-MIXED 12/11/2009  . HYPERTENSION, BENIGN 12/11/2009  . CAD, NATIVE VESSEL 12/11/2009  . AV BLOCK, COMPLETE 12/11/2009  . PACEMAKER-Medtronic 12/11/2009    Past Surgical  History:  Procedure Laterality Date  . ABDOMINAL SURGERY     twisted bowel  . CARDIOVERSION  06/11/2011   Procedure: CARDIOVERSION;  Surgeon: Dolores Pattyaniel R Bensimhon, MD;  [facility];  Service: Cardiovascular;  Laterality: N/A;  . CARDIOVERSION N/A 01/04/2013   Procedure: CARDIOVERSION;  Surgeon: Lars MassonKatarina H Nelson, MD;  [facility];  Service: Cardiovascular;  Laterality: N/A;  . COLON SURGERY    . HEMORROIDECTOMY    . PACEMAKER INSERTION  2005, 12/11/09   implanted by Dr Reyes IvanKersey, generator change 9/11 by Fawn KirkJA for premature ERI  . TEE WITHOUT CARDIOVERSION  06/11/2011   Procedure: TRANSESOPHAGEAL ECHOCARDIOGRAM (TEE);  Surgeon: Dolores Pattyaniel R Bensimhon, MD;  [facility];  Service: Cardiovascular;  Laterality: N/A;  . TONSILLECTOMY      OB History    No data available       Home Medications    Prior to Admission medications   Medication Sig Start Date End Date Taking? Authorizing Provider  amLODipine (NORVASC) 5 MG tablet TAKE 1 TABLET BY MOUTH DAILY 12/09/15  Yes Hillis RangeJames Allred, MD  BYSTOLIC 5 MG tablet TAKE 1/2 TABLET BY MOUTH DAILY 08/16/15  Yes Hillis RangeJames Allred, MD  fish oil-omega-3 fatty acids 1000 MG capsule Take 1 g by mouth daily.    Yes Historical Provider, MD  furosemide (LASIX) 40 MG tablet Take 1 tablet (40 mg total) by mouth daily. 03/11/16  Yes Dolores Pattyaniel R Bensimhon, MD  ibuprofen (ADVIL,MOTRIN) 200 MG  tablet Take 400 mg by mouth every 6 (six) hours as needed for moderate pain.   Yes Historical Provider, MD  latanoprost (XALATAN) 0.005 % ophthalmic solution Place 1 drop into both eyes at bedtime.     Yes Historical Provider, MD  levothyroxine (SYNTHROID, LEVOTHROID) 88 MCG tablet Take 1 mcg by mouth daily.  03/13/14  Yes Historical Provider, MD  Multiple Vitamins-Minerals (CENTRUM SILVER PO) Take 1 tablet by mouth daily.     Yes Historical Provider, MD  omeprazole (PRILOSEC) 20 MG capsule Take 20 mg by mouth 2 (two) times daily.    Yes Historical Provider, MD    potassium chloride SA (K-DUR,KLOR-CON) 20 MEQ tablet Take 1 tablet (20 mEq total) by mouth daily. 02/06/16  Yes Dolores Patty, MD  rosuvastatin (CRESTOR) 20 MG tablet Take 10 mg by mouth 3 (three) times a week. Andris Flurry and Sat   Yes Historical Provider, MD  timolol (BETIMOL) 0.5 % ophthalmic solution Place 1 drop into both eyes every morning.    Yes Historical Provider, MD  XARELTO 20 MG TABS tablet TAKE 1 TABLET BY MOUTH DAILY WITH SUPPER 04/30/16  Yes [name], MD  traMADol (ULTRAM) 50 MG tablet Take 1 tablet (50 mg total) by mouth every 6 (six) hours as needed. 06/17/16   Georgiana Shore, PA-C    Family History Family History  Problem Relation Age of Onset  . Cancer Mother   . Diabetes Mother   . Hypertension Mother   . Heart disease Father   . Cancer Brother   . Heart disease Brother     Social History Social History  Substance Use Topics  . Smoking status: Never Smoker  . Smokeless tobacco: Never Used  . Alcohol use 1.5 oz/week    3 Standard drinks or equivalent per week     Comment: 3/week - mixed drinks     Allergies   Patient has no known allergies.   Review of Systems Review of Systems  Constitutional: Negative for chills and fever.  HENT: Negative for ear pain and sore throat.   Eyes: Negative for pain and visual disturbance.  Respiratory: Negative for cough, choking, chest tightness, shortness of breath, wheezing and stridor.   Cardiovascular: Negative for chest pain, palpitations and leg swelling.  Gastrointestinal: Negative for abdominal distention, abdominal pain, diarrhea, nausea and vomiting.  Genitourinary: Negative for dysuria and hematuria.  Musculoskeletal: Positive for arthralgias and joint swelling. Negative for back pain, myalgias, neck pain and neck stiffness.  Skin: Negative for color change, pallor and rash.  Neurological: Negative for seizures, syncope, weakness and numbness.  All other systems reviewed and are  negative.    Physical Exam Updated Vital Signs BP 124/68   Pulse 60   Temp 98.5 F (36.9 C) (Oral)   Resp 16   SpO2 98%   Physical Exam  Constitutional: She appears well-developed and well-nourished. No distress.  Patient is afebrile, nontoxic-appearing, sitting in bed in discomfort if she moves her knee at all. Otherwise well-appearing and smiling.  HENT:  Head: Normocephalic and atraumatic.  Eyes: Conjunctivae are normal.  Neck: Normal range of motion. Neck supple.  Cardiovascular: Normal rate, regular rhythm, normal heart sounds and intact distal pulses.   No murmur heard. Pulmonary/Chest: Effort normal and breath sounds normal. No respiratory distress. She has no wheezes. She has no rales. She exhibits no tenderness.  Musculoskeletal: She exhibits edema and tenderness. She exhibits no deformity.  Decreased range of motion of the right knee due to  extreme pain. She is exquisitely tender to light touch. No erythema or warmth. Right Knee is swollen.   Neurological: She is alert.  Skin: Skin is warm and dry. No rash noted. She is not diaphoretic. No erythema. No pallor.  Psychiatric: She has a normal mood and affect. Her behavior is normal.  Nursing note and vitals reviewed.    ED Treatments / Results  Labs (all labs ordered are listed, but only abnormal results are displayed) Labs Reviewed  CBC WITH DIFFERENTIAL/PLATELET - Abnormal; Notable for the following:       Result Value   Hemoglobin 10.7 (*)    HCT 34.1 (*)    MCV 76.1 (*)    MCH 23.9 (*)    RDW 19.7 (*)    All other components within normal limits  BASIC METABOLIC PANEL - Abnormal; Notable for the following:    Glucose, Bld 159 (*)    All other components within normal limits  BODY FLUID CULTURE  C-REACTIVE PROTEIN  SEDIMENTATION RATE  GLUCOSE, SYNOVIAL FLUID  PROTEIN, SYNOVIAL FLUID  SYNOVIAL CELL COUNT + DIFF, W/ CRYSTALS    EKG  EKG Interpretation None       Radiology Dg Knee Complete 4  Views Right  Result Date: 06/17/2016 CLINICAL DATA:  Right knee pain and swelling without known injury. EXAM: RIGHT KNEE - COMPLETE 4+ VIEW COMPARISON:  None. FINDINGS: No fracture or dislocation is noted. Moderate suprapatellar joint effusion is noted. Vascular calcifications are noted. No significant joint space narrowing is noted. Chondrocalcinosis is noted laterally. IMPRESSION: Moderate suprapatellar joint effusion. No other acute abnormality seen in the right knee. Electronically Signed   By: Lupita Raider, M.D.   On: 06/17/2016 15:14    Procedures Procedures (including critical care time)  After consent was obtained, using sterile technique the right was prepped and plain Lidocaine 1% was used as local anesthetic. The joint was entered and 20 ml's of blood colored fluid was withdrawn and sent for fluid analysis . The procedure was well tolerated.  The patient is asked to continue to rest the joint for a few more days before resuming regular activities.  It may be more painful for the first 1-2 days.  Watch for fever, or increased swelling or persistent pain in the joint. Call or return to clinic prn if such symptoms occur or there is failure to improve as anticipated.  Medications Ordered in ED Medications  fentaNYL (SUBLIMAZE) injection 25 mcg (25 mcg Intravenous Given 06/17/16 1547)  ondansetron (ZOFRAN) injection 4 mg (4 mg Intravenous Given 06/17/16 1548)  lidocaine-EPINEPHrine (XYLOCAINE W/EPI) 1 %-1:100000 (with pres) injection 10 mL (10 mLs Intradermal Given 06/17/16 1808)  fentaNYL (SUBLIMAZE) injection 75 mcg (75 mcg Intravenous Given 06/17/16 1622)     Initial Impression / Assessment and Plan / ED Course  I have reviewed the triage vital signs and the nursing notes.  Pertinent labs & imaging results that were available during my care of the patient were reviewed by me and considered in my medical decision making (see chart for details).     Patient presenting with right knee  effusion of sudden onset pain upon awakening this morning. No erythema, fever, chills or other concerning findings. Patient is non-toxic and well-appearing otherwise.  Performed arthrocentesis which revealed a hemarthrosis and sent to the lab for evaluation. Patient's pain was well managed while in the emergency department. Patient was discussed with Dr. Dalene Seltzer who has seen patient and agrees with assessment and plan. She was present  for assistance during arthrocentesis.  Transferred patient care at end of shift to [facility], pending synovial fluid analysis to ensure no abnormalities on gram stain. Anticipate that this his hemarthrosis related to xarelto and discharge home with close follow up. Home with tramadol for pain.  Discussed strict return precautions and advised to return to the emergency department if experiencing any new or worsening symptoms. Instructions were understood and patient agreed with discharge plan.  Final Clinical Impressions(s) / ED Diagnoses   Final diagnoses:  Hemarthrosis, suspect secondary to blood thinners and osteoarthritis    New Prescriptions New Prescriptions   TRAMADOL (ULTRAM) 50 MG TABLET    Take 1 tablet (50 mg total) by mouth every 6 (six) hours as needed.     Georgiana Shore, PA-C 06/17/16 1832    Alvira Monday, MD 06/20/16 [phone]

## 2016-06-17 NOTE — ED Notes (Signed)
[facility] here to retrieve patient

## 2016-06-17 NOTE — ED Triage Notes (Signed)
Woke up with morning with right knee swelling. Can not bend it due to pain. No fall or injury that she knows of. It was fine last night when she went to bed.

## 2016-06-17 NOTE — ED Notes (Signed)
[facility] Expected date:  Expected time:  Means of arrival:  Comments: EMS/knee pain

## 2016-06-17 NOTE — ED Notes (Signed)
[facility] is notified of need to transport patient

## 2016-06-17 NOTE — ED Notes (Signed)
Pt is unable to get into a wheelchair even with the assistance of staff. Family also stated pt has many steps to get up to get into her home. Pt family requesting [facility] transport home for this reason

## 2016-06-17 NOTE — ED Provider Notes (Signed)
Pt's care turned over to me by Mathews RobinsonsJessica Mitchell.  Pt had aspiration of right knee.   Lab evaluation of fluid is pending.    Gram stain negative.  Pt has a lot of pain with moving.  Pt placed in knee imbolizer for comfort.  Pt is on anticoagulation.  Pt given rx and instructions for followup   Elson AreasLeslie K Kimani Bedoya, PA-C 06/17/16 2213    Lavera Guiseana Duo Liu, MD 06/18/16 1145

## 2016-06-17 NOTE — ED Notes (Signed)
Bed: [id number] Expected date:  Expected time:  Means of arrival:  [facility]

## 2016-06-18 ENCOUNTER — Emergency Department (HOSPITAL_COMMUNITY): Payer: Medicare Other

## 2016-06-18 ENCOUNTER — Encounter (HOSPITAL_COMMUNITY): Payer: Self-pay

## 2016-06-18 ENCOUNTER — Inpatient Hospital Stay (HOSPITAL_COMMUNITY)
Admission: EM | Admit: 2016-06-18 | Discharge: 2016-06-24 | DRG: 871 | Disposition: A | Discharge status: Skilled Nursing Facility | Payer: Medicare Other | Attending: Internal Medicine | Admitting: Internal Medicine

## 2016-06-18 DIAGNOSIS — I5032 Chronic diastolic (congestive) heart failure: Secondary | ICD-10-CM | POA: Diagnosis not present

## 2016-06-18 DIAGNOSIS — R6520 Severe sepsis without septic shock: Secondary | ICD-10-CM | POA: Diagnosis present

## 2016-06-18 DIAGNOSIS — I5033 Acute on chronic diastolic (congestive) heart failure: Secondary | ICD-10-CM | POA: Diagnosis present

## 2016-06-18 DIAGNOSIS — I10 Essential (primary) hypertension: Secondary | ICD-10-CM | POA: Diagnosis present

## 2016-06-18 DIAGNOSIS — T502X5A Adverse effect of carbonic-anhydrase inhibitors, benzothiadiazides and other diuretics, initial encounter: Secondary | ICD-10-CM | POA: Diagnosis not present

## 2016-06-18 DIAGNOSIS — B952 Enterococcus as the cause of diseases classified elsewhere: Secondary | ICD-10-CM | POA: Diagnosis present

## 2016-06-18 DIAGNOSIS — I248 Other forms of acute ischemic heart disease: Secondary | ICD-10-CM | POA: Diagnosis present

## 2016-06-18 DIAGNOSIS — M25461 Effusion, right knee: Secondary | ICD-10-CM | POA: Diagnosis present

## 2016-06-18 DIAGNOSIS — N390 Urinary tract infection, site not specified: Secondary | ICD-10-CM | POA: Diagnosis present

## 2016-06-18 DIAGNOSIS — R748 Abnormal levels of other serum enzymes: Secondary | ICD-10-CM | POA: Diagnosis not present

## 2016-06-18 DIAGNOSIS — E871 Hypo-osmolality and hyponatremia: Secondary | ICD-10-CM | POA: Diagnosis not present

## 2016-06-18 DIAGNOSIS — M2540 Effusion, unspecified joint: Secondary | ICD-10-CM

## 2016-06-18 DIAGNOSIS — Z8249 Family history of ischemic heart disease and other diseases of the circulatory system: Secondary | ICD-10-CM | POA: Diagnosis not present

## 2016-06-18 DIAGNOSIS — Z833 Family history of diabetes mellitus: Secondary | ICD-10-CM | POA: Diagnosis not present

## 2016-06-18 DIAGNOSIS — E876 Hypokalemia: Secondary | ICD-10-CM | POA: Diagnosis present

## 2016-06-18 DIAGNOSIS — I2510 Atherosclerotic heart disease of native coronary artery without angina pectoris: Secondary | ICD-10-CM | POA: Diagnosis present

## 2016-06-18 DIAGNOSIS — G8929 Other chronic pain: Secondary | ICD-10-CM | POA: Diagnosis present

## 2016-06-18 DIAGNOSIS — I110 Hypertensive heart disease with heart failure: Secondary | ICD-10-CM | POA: Diagnosis present

## 2016-06-18 DIAGNOSIS — E872 Acidosis, unspecified: Secondary | ICD-10-CM

## 2016-06-18 DIAGNOSIS — Z7901 Long term (current) use of anticoagulants: Secondary | ICD-10-CM | POA: Diagnosis not present

## 2016-06-18 DIAGNOSIS — K219 Gastro-esophageal reflux disease without esophagitis: Secondary | ICD-10-CM | POA: Diagnosis present

## 2016-06-18 DIAGNOSIS — J181 Lobar pneumonia, unspecified organism: Secondary | ICD-10-CM | POA: Diagnosis not present

## 2016-06-18 DIAGNOSIS — A419 Sepsis, unspecified organism: Secondary | ICD-10-CM | POA: Diagnosis present

## 2016-06-18 DIAGNOSIS — J9601 Acute respiratory failure with hypoxia: Secondary | ICD-10-CM | POA: Diagnosis not present

## 2016-06-18 DIAGNOSIS — Z7982 Long term (current) use of aspirin: Secondary | ICD-10-CM

## 2016-06-18 DIAGNOSIS — I482 Chronic atrial fibrillation: Secondary | ICD-10-CM | POA: Diagnosis present

## 2016-06-18 DIAGNOSIS — F0390 Unspecified dementia without behavioral disturbance: Secondary | ICD-10-CM | POA: Diagnosis present

## 2016-06-18 DIAGNOSIS — R778 Other specified abnormalities of plasma proteins: Secondary | ICD-10-CM

## 2016-06-18 DIAGNOSIS — R7989 Other specified abnormal findings of blood chemistry: Secondary | ICD-10-CM

## 2016-06-18 DIAGNOSIS — J189 Pneumonia, unspecified organism: Secondary | ICD-10-CM | POA: Diagnosis present

## 2016-06-18 DIAGNOSIS — I361 Nonrheumatic tricuspid (valve) insufficiency: Secondary | ICD-10-CM | POA: Diagnosis not present

## 2016-06-18 DIAGNOSIS — E038 Other specified hypothyroidism: Secondary | ICD-10-CM | POA: Diagnosis not present

## 2016-06-18 DIAGNOSIS — I4891 Unspecified atrial fibrillation: Secondary | ICD-10-CM | POA: Diagnosis not present

## 2016-06-18 DIAGNOSIS — R042 Hemoptysis: Secondary | ICD-10-CM

## 2016-06-18 DIAGNOSIS — Z950 Presence of cardiac pacemaker: Secondary | ICD-10-CM

## 2016-06-18 DIAGNOSIS — M25469 Effusion, unspecified knee: Secondary | ICD-10-CM | POA: Diagnosis not present

## 2016-06-18 DIAGNOSIS — R0602 Shortness of breath: Secondary | ICD-10-CM | POA: Diagnosis present

## 2016-06-18 DIAGNOSIS — J9600 Acute respiratory failure, unspecified whether with hypoxia or hypercapnia: Secondary | ICD-10-CM | POA: Diagnosis present

## 2016-06-18 DIAGNOSIS — E785 Hyperlipidemia, unspecified: Secondary | ICD-10-CM | POA: Diagnosis present

## 2016-06-18 DIAGNOSIS — H409 Unspecified glaucoma: Secondary | ICD-10-CM | POA: Diagnosis present

## 2016-06-18 DIAGNOSIS — R6521 Severe sepsis with septic shock: Secondary | ICD-10-CM

## 2016-06-18 HISTORY — DX: Heart failure, unspecified: I50.9

## 2016-06-18 HISTORY — DX: Hemoptysis: R04.2

## 2016-06-18 LAB — TROPONIN I
Troponin I: 0.2 ng/mL (ref <0.03)
Troponin I: 0.21 ng/mL (ref <0.03)

## 2016-06-18 LAB — GLUCOSE, BODY FLUID OTHER: GLUCOSE, BODY FLUID OTHER: 30 mg/dL

## 2016-06-18 LAB — CBC WITH DIFFERENTIAL/PLATELET
BASOS PCT: 0 %
Basophils Absolute: 0 10*3/uL (ref 0.0–0.1)
EOS ABS: 0 10*3/uL (ref 0.0–0.7)
Eosinophils Relative: 0 %
HEMATOCRIT: 34 % — AB (ref 36.0–46.0)
Hemoglobin: 10.9 g/dL — ABNORMAL LOW (ref 12.0–15.0)
LYMPHS PCT: 5 %
Lymphs Abs: 0.6 10*3/uL — ABNORMAL LOW (ref 0.7–4.0)
MCH: 24.9 pg — AB (ref 26.0–34.0)
MCHC: 32.1 g/dL (ref 30.0–36.0)
MCV: 77.8 fL — ABNORMAL LOW (ref 78.0–100.0)
MONOS PCT: 2 %
Monocytes Absolute: 0.2 10*3/uL (ref 0.1–1.0)
NEUTROS ABS: 11.4 10*3/uL — AB (ref 1.7–7.7)
NEUTROS PCT: 93 %
Platelets: 220 10*3/uL (ref 150–400)
RBC: 4.37 MIL/uL (ref 3.87–5.11)
RDW: 20 % — ABNORMAL HIGH (ref 11.5–15.5)
WBC: 12.2 10*3/uL — ABNORMAL HIGH (ref 4.0–10.5)

## 2016-06-18 LAB — PROTEIN, BODY FLUID (OTHER): TOTAL PROTEIN, BODY FLUID OTHER: 4.8 g/dL

## 2016-06-18 LAB — COMPREHENSIVE METABOLIC PANEL
ALBUMIN: 3.6 g/dL (ref 3.5–5.0)
ALT: 18 U/L (ref 14–54)
ANION GAP: 14 (ref 5–15)
AST: 43 U/L — ABNORMAL HIGH (ref 15–41)
Alkaline Phosphatase: 104 U/L (ref 38–126)
BUN: 17 mg/dL (ref 6–20)
CALCIUM: 9.5 mg/dL (ref 8.9–10.3)
CO2: 19 mmol/L — AB (ref 22–32)
Chloride: 101 mmol/L (ref 101–111)
Creatinine, Ser: 0.88 mg/dL (ref 0.44–1.00)
GFR calc non Af Amer: 57 mL/min — ABNORMAL LOW (ref >60)
GLUCOSE: 181 mg/dL — AB (ref 65–99)
POTASSIUM: 3.8 mmol/L (ref 3.5–5.1)
SODIUM: 134 mmol/L — AB (ref 135–145)
Total Bilirubin: 1.3 mg/dL — ABNORMAL HIGH (ref 0.3–1.2)
Total Protein: 6.9 g/dL (ref 6.5–8.1)

## 2016-06-18 LAB — URINALYSIS, ROUTINE W REFLEX MICROSCOPIC
Bilirubin Urine: NEGATIVE
Glucose, UA: >=500 mg/dL — AB
Hgb urine dipstick: NEGATIVE
Ketones, ur: NEGATIVE mg/dL
Nitrite: NEGATIVE
PROTEIN: 30 mg/dL — AB
SPECIFIC GRAVITY, URINE: 1.023 (ref 1.005–1.030)
pH: 5 (ref 5.0–8.0)

## 2016-06-18 LAB — PHOSPHORUS: Phosphorus: 3.6 mg/dL (ref 2.5–4.6)

## 2016-06-18 LAB — MRSA PCR SCREENING: MRSA by PCR: NEGATIVE

## 2016-06-18 LAB — POC OCCULT BLOOD, ED: Fecal Occult Bld: NEGATIVE

## 2016-06-18 LAB — I-STAT CG4 LACTIC ACID, ED: LACTIC ACID, VENOUS: 5.07 mmol/L — AB (ref 0.5–1.9)

## 2016-06-18 LAB — ABO/RH: ABO/RH(D): B POS

## 2016-06-18 LAB — LACTIC ACID, PLASMA
LACTIC ACID, VENOUS: 2.7 mmol/L — AB (ref 0.5–1.9)
Lactic Acid, Venous: 4 mmol/L (ref 0.5–1.9)

## 2016-06-18 LAB — MAGNESIUM: Magnesium: 1.8 mg/dL (ref 1.7–2.4)

## 2016-06-18 LAB — HEMOGLOBIN AND HEMATOCRIT, BLOOD
HCT: 31.5 % — ABNORMAL LOW (ref 36.0–46.0)
HEMOGLOBIN: 9.8 g/dL — AB (ref 12.0–15.0)

## 2016-06-18 MED ORDER — POTASSIUM CHLORIDE CRYS ER 20 MEQ PO TBCR
20.0000 meq | EXTENDED_RELEASE_TABLET | Freq: Every day | ORAL | Status: DC
  Administered 2016-06-18 – 2016-06-24 (×7): 20 meq via ORAL
  Filled 2016-06-18 (×7): qty 1

## 2016-06-18 MED ORDER — PANTOPRAZOLE SODIUM 40 MG PO TBEC
40.0000 mg | DELAYED_RELEASE_TABLET | Freq: Every day | ORAL | Status: DC
  Administered 2016-06-18 – 2016-06-24 (×7): 40 mg via ORAL
  Filled 2016-06-18 (×7): qty 1

## 2016-06-18 MED ORDER — IPRATROPIUM-ALBUTEROL 0.5-2.5 (3) MG/3ML IN SOLN
3.0000 mL | Freq: Four times a day (QID) | RESPIRATORY_TRACT | Status: DC
  Administered 2016-06-18: 3 mL via RESPIRATORY_TRACT
  Filled 2016-06-18: qty 3

## 2016-06-18 MED ORDER — ACETAMINOPHEN 650 MG RE SUPP
650.0000 mg | Freq: Four times a day (QID) | RECTAL | Status: DC | PRN
Start: 2016-06-18 — End: 2016-06-24

## 2016-06-18 MED ORDER — VANCOMYCIN HCL IN DEXTROSE 750-5 MG/150ML-% IV SOLN
750.0000 mg | Freq: Once | INTRAVENOUS | Status: AC
  Administered 2016-06-18: 750 mg via INTRAVENOUS
  Filled 2016-06-18: qty 150

## 2016-06-18 MED ORDER — VANCOMYCIN HCL IN DEXTROSE 750-5 MG/150ML-% IV SOLN
750.0000 mg | Freq: Two times a day (BID) | INTRAVENOUS | Status: DC
  Administered 2016-06-19 – 2016-06-23 (×10): 750 mg via INTRAVENOUS
  Filled 2016-06-18 (×10): qty 150

## 2016-06-18 MED ORDER — ONDANSETRON HCL 4 MG PO TABS: 4.0000 mg | ORAL_TABLET | Freq: Four times a day (QID) | ORAL | Status: DC | PRN

## 2016-06-18 MED ORDER — LATANOPROST 0.005 % OP SOLN
1.0000 [drp] | Freq: Every day | OPHTHALMIC | Status: DC
  Administered 2016-06-18 – 2016-06-23 (×6): 1 [drp] via OPHTHALMIC
  Filled 2016-06-18: qty 2.5

## 2016-06-18 MED ORDER — RIVAROXABAN 15 MG PO TABS
15.0000 mg | ORAL_TABLET | Freq: Every day | ORAL | Status: DC
  Administered 2016-06-18: 15 mg via ORAL
  Filled 2016-06-18 (×2): qty 1

## 2016-06-18 MED ORDER — RIVAROXABAN 20 MG PO TABS
20.0000 mg | ORAL_TABLET | Freq: Every day | ORAL | Status: DC
  Administered 2016-06-19 – 2016-06-24 (×6): 20 mg via ORAL
  Filled 2016-06-18 (×6): qty 1

## 2016-06-18 MED ORDER — ASPIRIN 325 MG PO TABS
325.0000 mg | ORAL_TABLET | Freq: Every day | ORAL | Status: DC
  Administered 2016-06-18 – 2016-06-23 (×6): 325 mg via ORAL
  Filled 2016-06-18 (×6): qty 1

## 2016-06-18 MED ORDER — ACETAMINOPHEN 325 MG PO TABS: 650.0000 mg | ORAL_TABLET | Freq: Four times a day (QID) | ORAL | Status: DC | PRN

## 2016-06-18 MED ORDER — ONDANSETRON HCL 4 MG/2ML IJ SOLN: 4.0000 mg | Freq: Four times a day (QID) | INTRAMUSCULAR | Status: DC | PRN

## 2016-06-18 MED ORDER — SODIUM CHLORIDE 0.9% FLUSH
3.0000 mL | Freq: Two times a day (BID) | INTRAVENOUS | Status: DC
  Administered 2016-06-18 – 2016-06-24 (×8): 3 mL via INTRAVENOUS

## 2016-06-18 MED ORDER — TIMOLOL MALEATE 0.5 % OP SOLN
1.0000 [drp] | Freq: Every morning | OPHTHALMIC | Status: DC
  Administered 2016-06-19 – 2016-06-24 (×7): 1 [drp] via OPHTHALMIC
  Filled 2016-06-18: qty 5

## 2016-06-18 MED ORDER — HYDROMORPHONE HCL 1 MG/ML IJ SOLN
0.5000 mg | Freq: Once | INTRAMUSCULAR | Status: AC
  Administered 2016-06-18: 0.5 mg via INTRAVENOUS
  Filled 2016-06-18: qty 1

## 2016-06-18 MED ORDER — PIPERACILLIN-TAZOBACTAM 3.375 G IVPB 30 MIN
3.3750 g | Freq: Once | INTRAVENOUS | Status: AC
  Administered 2016-06-18: 3.375 g via INTRAVENOUS
  Filled 2016-06-18: qty 50

## 2016-06-18 MED ORDER — IPRATROPIUM-ALBUTEROL 0.5-2.5 (3) MG/3ML IN SOLN
3.0000 mL | Freq: Three times a day (TID) | RESPIRATORY_TRACT | Status: DC
  Administered 2016-06-18 – 2016-06-20 (×5): 3 mL via RESPIRATORY_TRACT
  Filled 2016-06-18 (×5): qty 3

## 2016-06-18 MED ORDER — ALBUTEROL SULFATE (2.5 MG/3ML) 0.083% IN NEBU
2.5000 mg | INHALATION_SOLUTION | RESPIRATORY_TRACT | Status: DC | PRN
  Administered 2016-06-21 (×2): 2.5 mg via RESPIRATORY_TRACT
  Filled 2016-06-18 (×2): qty 3

## 2016-06-18 MED ORDER — PIPERACILLIN-TAZOBACTAM 3.375 G IVPB
3.3750 g | Freq: Three times a day (TID) | INTRAVENOUS | Status: DC
  Administered 2016-06-18 – 2016-06-23 (×15): 3.375 g via INTRAVENOUS
  Filled 2016-06-18 (×16): qty 50

## 2016-06-18 MED ORDER — LEVOTHYROXINE SODIUM 88 MCG PO TABS
88.0000 ug | ORAL_TABLET | Freq: Every day | ORAL | Status: DC
  Administered 2016-06-19 – 2016-06-24 (×6): 88 ug via ORAL
  Filled 2016-06-18 (×6): qty 1

## 2016-06-18 MED ORDER — SODIUM CHLORIDE 0.9 % IV BOLUS (SEPSIS)
250.0000 mL | Freq: Once | INTRAVENOUS | Status: AC
  Administered 2016-06-18: 250 mL via INTRAVENOUS

## 2016-06-18 MED ORDER — HYDRALAZINE HCL 20 MG/ML IJ SOLN: 10.0000 mg | Freq: Three times a day (TID) | INTRAMUSCULAR | Status: DC | PRN

## 2016-06-18 MED ORDER — VANCOMYCIN HCL IN DEXTROSE 1-5 GM/200ML-% IV SOLN
1000.0000 mg | Freq: Once | INTRAVENOUS | Status: AC
  Administered 2016-06-18: 1000 mg via INTRAVENOUS
  Filled 2016-06-18: qty 200

## 2016-06-18 MED ORDER — ROSUVASTATIN CALCIUM 10 MG PO TABS: 10.0000 mg | ORAL_TABLET | ORAL | Status: DC

## 2016-06-18 MED ORDER — SODIUM CHLORIDE 0.9 % IV BOLUS (SEPSIS)
1000.0000 mL | Freq: Once | INTRAVENOUS | Status: AC
  Administered 2016-06-18: 1000 mL via INTRAVENOUS

## 2016-06-18 MED ORDER — SODIUM CHLORIDE 0.9 % IV BOLUS (SEPSIS): 1000.0000 mL | Freq: Once | INTRAVENOUS | Status: DC

## 2016-06-18 MED ORDER — ONDANSETRON HCL 4 MG/2ML IJ SOLN
4.0000 mg | Freq: Once | INTRAMUSCULAR | Status: AC
  Administered 2016-06-18: 4 mg via INTRAVENOUS
  Filled 2016-06-18: qty 2

## 2016-06-18 MED ORDER — GUAIFENESIN ER 600 MG PO TB12
600.0000 mg | ORAL_TABLET | Freq: Two times a day (BID) | ORAL | Status: DC
  Administered 2016-06-19 – 2016-06-24 (×11): 600 mg via ORAL
  Filled 2016-06-18 (×11): qty 1

## 2016-06-18 MED ORDER — SODIUM CHLORIDE 0.9 % IV SOLN
INTRAVENOUS | Status: DC
  Administered 2016-06-18 – 2016-06-19 (×2): via INTRAVENOUS

## 2016-06-18 NOTE — Progress Notes (Signed)
CRITICAL VALUE ALERT  Critical value received: Lactic Acid  Date of notification:  06/18/2016  Time of notification:  0425  Critical value read back:Yes.    Nurse who received alert:  Raymon MuttonGwen Rembert Browe RN  MD notified (1st page):  Dr Melynda RippleHobbs  Time of first page:  1630  Responding MD:  Dr Melynda RippleHobbs  Time MD responded:  [phone]

## 2016-06-18 NOTE — H&P (Addendum)
[facility] History and Physical  Randel PiggCharlotte Underberg MVH:[id number] DOB: October 12, 1924 DOA: 06/18/2016  Referring physician:  PCP: Julian HySOUTH,[name] ALAN, MD   Chief Complaint: "She had blood in her mouth."  HPI: Randel PiggCharlotte Dispenza is a 83 y.o. female  significant for dementia, atrial fibrillation, coronary disease, high blood pressure, low thyroid present to the emergency room with chief complaint of shortness of breath. Of note patient was in the emergency room yesterday for right knee pain and had fluid drawn off. Patient then went home with her daughter and had extreme weakness this morning. Patient had exhibited no symptoms of fever, chills, nausea, vomiting the days prior. But did have chills for the daughter within admission. Patient was brought to the emergency room for evaluation.   ED course: Patient started on sepsis protocol. Hosp svc consulted. One episode of dark blood in the mouth.   Review of Systems:  As per HPI otherwise 10 point review of systems negative.    Past Medical History:  Diagnosis Date  . Atrial flutter (HCC)    a. Noted during pacemaker implantation 2011, spontaneously terminated.  . Biatrial enlargement    severe  . CAD (coronary artery disease)    a. Nonobstructive by cath 2005 - 40-50% mid LAD. b. Nonischemic nuc 05/2011.  . Carotid artery disease (HCC)    a. 1-50% bilateral (upper end) - March 2011.  Marland Kitchen. DJD (degenerative joint disease)   . GERD (gastroesophageal reflux disease)   . Hyperlipidemia   . Hypertension   . Hypothyroid   . Persistent atrial fibrillation (HCC) 05/2011  . Second degree heart block    a. s/p PPM 2005. b. Gen change to Medtronic by Dr Johney FrameAllred  9/11 for premature ERI 11/2009.   Past Surgical History:  Procedure Laterality Date  . ABDOMINAL SURGERY     twisted bowel  . CARDIOVERSION  06/11/2011   Procedure: CARDIOVERSION;  Surgeon: Dolores Pattyaniel R Bensimhon, MD;  [facility];  Service: Cardiovascular;  Laterality: N/A;    . CARDIOVERSION N/A 01/04/2013   Procedure: CARDIOVERSION;  Surgeon: Lars MassonKatarina H Nelson, MD;  [facility];  Service: Cardiovascular;  Laterality: N/A;  . COLON SURGERY    . HEMORROIDECTOMY    . PACEMAKER INSERTION  2005, 12/11/09   implanted by Dr Reyes IvanKersey, generator change 9/11 by Fawn KirkJA for premature ERI  . TEE WITHOUT CARDIOVERSION  06/11/2011   Procedure: TRANSESOPHAGEAL ECHOCARDIOGRAM (TEE);  Surgeon: Dolores Pattyaniel R Bensimhon, MD;  [facility];  Service: Cardiovascular;  Laterality: N/A;  . TONSILLECTOMY     Social History:  reports that she has never smoked. She has never used smokeless tobacco. She reports that she drinks alcohol. She reports that she does not use drugs.  No Known Allergies  Family History  Problem Relation Age of Onset  . Cancer Mother   . Diabetes Mother   . Hypertension Mother   . Heart disease Father   . Cancer Brother   . Heart disease Brother      Prior to Admission medications   Medication Sig Start Date End Date Taking? Authorizing Provider  amLODipine (NORVASC) 5 MG tablet TAKE 1 TABLET BY MOUTH DAILY 12/09/15   Hillis RangeJames Allred, MD  BYSTOLIC 5 MG tablet TAKE 1/2 TABLET BY MOUTH DAILY 08/16/15   Hillis RangeJames Allred, MD  fish oil-omega-3 fatty acids 1000 MG capsule Take 1 g by mouth daily.     Historical Provider, MD  furosemide (LASIX) 40 MG tablet Take 1 tablet (40 mg total) by mouth daily. 03/11/16  Dolores Patty, MD  ibuprofen (ADVIL,MOTRIN) 200 MG tablet Take 400 mg by mouth every 6 (six) hours as needed for moderate pain.    Historical Provider, MD  latanoprost (XALATAN) 0.005 % ophthalmic solution Place 1 drop into both eyes at bedtime.      Historical Provider, MD  levothyroxine (SYNTHROID, LEVOTHROID) 88 MCG tablet Take 1 mcg by mouth daily.  03/13/14   Historical Provider, MD  Multiple Vitamins-Minerals (CENTRUM SILVER PO) Take 1 tablet by mouth daily.      Historical Provider, MD  omeprazole (PRILOSEC) 20 MG capsule Take 20 mg by mouth 2  (two) times daily.     Historical Provider, MD  potassium chloride SA (K-DUR,KLOR-CON) 20 MEQ tablet Take 1 tablet (20 mEq total) by mouth daily. 02/06/16   Dolores Patty, MD  rosuvastatin (CRESTOR) 20 MG tablet Take 10 mg by mouth 3 (three) times a week. Andris Flurry and Sat    Historical Provider, MD  timolol (BETIMOL) 0.5 % ophthalmic solution Place 1 drop into both eyes every morning.     Historical Provider, MD  traMADol (ULTRAM) 50 MG tablet Take 1 tablet (50 mg total) by mouth every 6 (six) hours as needed. 06/17/16   Georgiana Shore, PA-C  XARELTO 20 MG TABS tablet TAKE 1 TABLET BY MOUTH DAILY WITH SUPPER 04/30/16   [name], MD   Physical Exam: Vitals:   06/18/16 1115 06/18/16 1147 06/18/16 1235 06/18/16 1245  BP: (!) 87/46 (!) 108/93 (!) 91/58 (!) 99/50  Pulse: 66 62 63 61  Resp: (!) 24 (!) 23 16 15   Temp:   98.6 F (37 C)   TempSrc:   Oral   SpO2: 95% 98% 95% 97%  Weight:      Height:        Wt Readings from Last 3 Encounters:  06/18/16 74.8 kg (165 lb)  05/08/16 74.8 kg (165 lb)  03/11/16 74.6 kg (164 lb 6.4 oz)    General:  Appears calm and comfortable, A&Ox3 Eyes:  PERRL, EOMI, normal lids, iris ENT:  grossly normal hearing, lips & tongue Neck:  no LAD, masses or thyromegaly Cardiovascular:  RRR, no m/r/g. No LE edema.  Respiratory:  decr airmovement, no w/r/r. incr wob, using accessory muscles Abdomen:  soft, ntnd Skin:  no rash or induration seen on limited exam Musculoskeletal:  grossly normal tone BUE/BLE Psychiatric:  grossly normal mood and affect, speech fluent and appropriate Neurologic:  CN 2-12 grossly intact, moves all extremities in coordinated fashion.          Labs on Admission:  Basic Metabolic Panel:  Recent Labs Lab 06/17/16 1433 06/18/16 1121  NA 135 134*  K 4.2 3.8  CL 103 101  CO2 24 19*  GLUCOSE 159* 181*  BUN 15 17  CREATININE 0.62 0.88  CALCIUM 9.4 9.5   Liver Function Tests:  Recent Labs Lab 06/18/16 1121  AST  43*  ALT 18  ALKPHOS 104  BILITOT 1.3*  PROT 6.9  ALBUMIN 3.6   No results for input(s): LIPASE, AMYLASE in the last 168 hours. No results for input(s): AMMONIA in the last 168 hours. CBC:  Recent Labs Lab 06/17/16 1433 06/18/16 1121  WBC 9.0 12.2*  NEUTROABS 6.9 11.4*  HGB 10.7* 10.9*  HCT 34.1* 34.0*  MCV 76.1* 77.8*  PLT 219 220   Cardiac Enzymes: No results for input(s): CKTOTAL, CKMB, CKMBINDEX, TROPONINI in the last 168 hours.  BNP (last 3 results)  Recent Labs  03/11/16  1241  BNP 570.5*    ProBNP (last 3 results) No results for input(s): PROBNP in the last 8760 hours.   Serum creatinine: 0.88 mg/dL 96/04/54 0981 Estimated creatinine clearance: 44.8 mL/min  CBG: No results for input(s): GLUCAP in the last 168 hours.  Radiological Exams on Admission: Dg Chest Portable 1 View  Result Date: 06/18/2016 CLINICAL DATA:  Shortness of Breath EXAM: PORTABLE CHEST 1 VIEW COMPARISON:  February 06, 2016 FINDINGS: There is patchy airspace opacity throughout much of the right upper lobe. Lungs elsewhere are clear. There is cardiomegaly with pulmonary venous hypertension. Pacemaker leads are attached to the right atrium and right ventricle. There is atherosclerotic calcification in the aorta. No adenopathy. No bone lesions. IMPRESSION: Right upper lobe airspace opacity, likely pneumonia. Lungs elsewhere clear. There is pulmonary vascular congestion which appear stable. Pacemaker leads unchanged. There is aortic atherosclerosis. Followup PA and lateral chest radiographs recommended in 3-4 weeks following trial of antibiotic therapy to ensure resolution and exclude underlying malignancy. Electronically Signed   By: Bretta Bang III M.D.   On: 06/18/2016 10:52   Dg Knee Complete 4 Views Right  Result Date: 06/17/2016 CLINICAL DATA:  Right knee pain and swelling without known injury. EXAM: RIGHT KNEE - COMPLETE 4+ VIEW COMPARISON:  None. FINDINGS: No fracture or dislocation  is noted. Moderate suprapatellar joint effusion is noted. Vascular calcifications are noted. No significant joint space narrowing is noted. Chondrocalcinosis is noted laterally. IMPRESSION: Moderate suprapatellar joint effusion. No other acute abnormality seen in the right knee. Electronically Signed   By: Lupita Raider, M.D.   On: 06/17/2016 15:14    EKG: Independently reviewed. No STEMI  Assessment/Plan Principal Problem:   Sepsis due to pneumonia [facility]) Active Problems:   HLD (hyperlipidemia)   HYPERTENSION, BENIGN   Atrial fibrillation (HCC)   Chronic diastolic CHF (congestive heart failure) (HCC)  CAP/Sepsis Scheduled DuoNeb's When necessary albuterol Antibiotic-Vanc and zosyn Oxygen therapy Continuous pulse oximetry Sputum cult Flutter valve & IS BID mucinex Trend LA Blood Cult x2 pending Add on trop and future trop ordered Repeat CXR in AM  Knee swelling Tap shows WBCs EDP consulted ortho  Dark blood in mouth Recheck hgb in evening and AM tomorrow  UTI See abx above UCx sent  Chronic pain Hold home tramadol  Afib Cont xarelto  CHF Hold lasix, ystolic due to soft BP Cont KDUr  Hyperlipidemia Continue statin  Hypertension When necessary hydralazine 10 mg IV as needed for severe blood pressure Hold norvasc  Hypothyroidism Cont OP synthroid 88 mcg qd No signs of hyper or hypothyroidism  GERD Cont PPI  Glaucoma Cont Xalatan & betimol drops in both eyes  Code Status: FULL  DVT Prophylaxis: Xarelto Family Communication: dgtr and son @ bedside Disposition Plan: Pending Improvement  Status: inpt tele  Haydee Salter, MD Family Medicine [facility] [email or web address] Password [email or web address]

## 2016-06-18 NOTE — ED Provider Notes (Addendum)
[facility]-EMERGENCY DEPT Provider Note   CSN: [id number] Arrival date & time: 06/18/16  1021     History   Chief Complaint Chief Complaint  Patient presents with  . GI Bleeding  . Shortness of Breath    HPI Beth Santiago is a 83 y.o. female.  Patient c/o sob, and had episode hemoptysis today. No hx same. Is on anticoag therapy for hx afib. Was in ED yesterday w c/o acute right knee pain for the past 2 days. Pain constant, dull, mod-severe, non radiating. No hx same pain. No trauma to knee. Had knee tapped in ED yesterday, was felt inflammatory effusion then.   Pt denies fever or chills. No sore throat runny nose or other uri c/o. No chest pain. No abd pain. No vomiting or diarrhea. No dysuria or gu c/o.    The history is provided by the patient.  Shortness of Breath  Associated symptoms include cough. Pertinent negatives include no fever, no headaches, no sore throat, no neck pain, no chest pain, no vomiting, no abdominal pain and no rash.    Past Medical History:  Diagnosis Date  . Atrial flutter (HCC)    a. Noted during pacemaker implantation 2011, spontaneously terminated.  . Biatrial enlargement    severe  . CAD (coronary artery disease)    a. Nonobstructive by cath 2005 - 40-50% mid LAD. b. Nonischemic nuc 05/2011.  . Carotid artery disease (HCC)    a. 1-50% bilateral (upper end) - March 2011.  Marland Kitchen DJD (degenerative joint disease)   . GERD (gastroesophageal reflux disease)   . Hyperlipidemia   . Hypertension   . Hypothyroid   . Persistent atrial fibrillation (HCC) 05/2011  . Second degree heart block    a. s/p PPM 2005. b. Gen change to Medtr[employer] by Dr Johney Frame  9/11 for premature ERI 11/2009.    Patient Active Problem List   Diagnosis Date Noted  . Chronic diastolic CHF (congestive heart failure) (HCC) 03/11/2016  . Snoring 03/11/2016  . Dizziness 03/26/2014  . Fatigue 12/09/2012  . Precordial chest pain 12/09/2012  . Dyspnea 06/11/2011  . Atrial fibrillation  (HCC) 06/11/2011  . HYPERLIPIDEMIA-MIXED 12/11/2009  . HYPERTENSION, BENIGN 12/11/2009  . CAD, NATIVE VESSEL 12/11/2009  . AV BLOCK, COMPLETE 12/11/2009  . PACEMAKER-Medtronic 12/11/2009    Past Surgical History:  Procedure Laterality Date  . ABDOMINAL SURGERY     twisted bowel  . CARDIOVERSION  06/11/2011   Procedure: CARDIOVERSION;  Surgeon: Dolores Patty, MD;  Location: [facility] ENDOSCOPY;  Service: Cardiovascular;  Laterality: N/A;  . CARDIOVERSION N/A 01/04/2013   Procedure: CARDIOVERSION;  Surgeon: Lars Masson, MD;  Location: [facility] ENDOSCOPY;  Service: Cardiovascular;  Laterality: N/A;  . COLON SURGERY    . HEMORROIDECTOMY    . PACEMAKER INSERTION  2005, 12/11/09   implanted by Dr Reyes Ivan, generator change 9/11 by Fawn Kirk for premature ERI  . TEE WITHOUT CARDIOVERSION  06/11/2011   Procedure: TRANSESOPHAGEAL ECHOCARDIOGRAM (TEE);  Surgeon: Dolores Patty, MD;  Location: [facility] ENDOSCOPY;  Service: Cardiovascular;  Laterality: N/A;  . TONSILLECTOMY      OB History    No data available       Home Medications    Prior to Admission medications   Medication Sig Start Date End Date Taking? Authorizing Provider  amLODipine (NORVASC) 5 MG tablet TAKE 1 TABLET BY MOUTH DAILY 12/09/15   [name], MD  BYSTOLIC 5 MG tablet TAKE 1/2 TABLET BY MOUTH DAILY 08/16/15   [name], MD  fish oil-omega-3 fatty acids 1000 MG capsule Take 1 g by mouth daily.     Historical Provider, MD  furosemide (LASIX) 40 MG tablet Take 1 tablet (40 mg total) by mouth daily. 03/11/16   Dolores Patty, MD  ibuprofen (ADVIL,MOTRIN) 200 MG tablet Take 400 mg by mouth every 6 (six) hours as needed for moderate pain.    Historical Provider, MD  latanoprost (XALATAN) 0.005 % ophthalmic solution Place 1 drop into both eyes at bedtime.      Historical Provider, MD  levothyroxine (SYNTHROID, LEVOTHROID) 88 MCG tablet Take 1 mcg by mouth daily.  03/13/14   Historical Provider, MD  Multiple Vitamins-Minerals  (CENTRUM SILVER PO) Take 1 tablet by mouth daily.      Historical Provider, MD  omeprazole (PRILOSEC) 20 MG capsule Take 20 mg by mouth 2 (two) times daily.     Historical Provider, MD  potassium chloride SA (K-DUR,KLOR-CON) 20 MEQ tablet Take 1 tablet (20 mEq total) by mouth daily. 02/06/16   Dolores Patty, MD  rosuvastatin (CRESTOR) 20 MG tablet Take 10 mg by mouth 3 (three) times a week. Andris Flurry and Sat    Historical Provider, MD  timolol (BETIMOL) 0.5 % ophthalmic solution Place 1 drop into both eyes every morning.     Historical Provider, MD  traMADol (ULTRAM) 50 MG tablet Take 1 tablet (50 mg total) by mouth every 6 (six) hours as needed. 06/17/16   Georgiana Shore, PA-C  XARELTO 20 MG TABS tablet TAKE 1 TABLET BY MOUTH DAILY WITH SUPPER 04/30/16   [name], MD    Family History Family History  Problem Relation Age of Onset  . Cancer Mother   . Diabetes Mother   . Hypertension Mother   . Heart disease Father   . Cancer Brother   . Heart disease Brother     Social History Social History  Substance Use Topics  . Smoking status: Never Smoker  . Smokeless tobacco: Never Used  . Alcohol use Yes     Comment: ETOH daily      Allergies   Patient has no known allergies.   Review of Systems Review of Systems  Constitutional: Negative for chills and fever.  HENT: Negative for sore throat.   Eyes: Negative for redness.  Respiratory: Positive for cough and shortness of breath.   Cardiovascular: Negative for chest pain.  Gastrointestinal: Negative for abdominal pain, diarrhea and vomiting.  Genitourinary: Negative for dysuria and flank pain.  Musculoskeletal: Negative for back pain and neck pain.  Skin: Negative for rash.  Neurological: Negative for headaches.  Hematological: Does not bruise/bleed easily.  Psychiatric/Behavioral: Negative for confusion.     Physical Exam Updated Vital Signs BP (!) 108/93   Pulse 62   Temp 98.3 F (36.8 C) (Oral)   Resp (!)  23   Ht 5\' 6"  (1.676 m)   Wt 74.8 kg   SpO2 98%   BMI 26.63 kg/m   Physical Exam  Constitutional: She appears well-developed and well-nourished. No distress.  HENT:  Mouth/Throat: Oropharynx is clear and moist.  Dried dark blood on lips  Eyes: Conjunctivae are normal. No scleral icterus.  Neck: Neck supple. No tracheal deviation present.  Cardiovascular: Normal rate, regular rhythm, normal heart sounds and intact distal pulses.   Pulmonary/Chest: Effort normal and breath sounds normal. No respiratory distress.  Right upper lobe rhonchi/rales.   Abdominal: Soft. Normal appearance and bowel sounds are normal. She exhibits no distension and no mass. There  is no tenderness. There is no rebound and no guarding. No hernia.  Genitourinary:  Genitourinary Comments: No cva tenderness  Musculoskeletal: She exhibits no edema.  Right knee swelling/effusion, mild increased warmth, no erythema. +pain w active and passive rom. Distal pulses palp.   Neurological: She is alert.  Skin: Skin is warm and dry. No rash noted. She is not diaphoretic.  Psychiatric: She has a normal mood and affect.  Nursing note and vitals reviewed.    ED Treatments / Results  Labs (all labs ordered are listed, but only abnormal results are displayed) Results for orders placed or performed during the hospital encounter of 06/18/16  Comprehensive metabolic panel  Result Value Ref Range   Sodium 134 (L) 135 - 145 mmol/L   Potassium 3.8 3.5 - 5.1 mmol/L   Chloride 101 101 - 111 mmol/L   CO2 19 (L) 22 - 32 mmol/L   Glucose, Bld 181 (H) 65 - 99 mg/dL   BUN 17 6 - 20 mg/dL   Creatinine, Ser 9.60 0.44 - 1.00 mg/dL   Calcium 9.5 8.9 - 45.4 mg/dL   Total Protein 6.9 6.5 - 8.1 g/dL   Albumin 3.6 3.5 - 5.0 g/dL   AST 43 (H) 15 - 41 U/L   ALT 18 14 - 54 U/L   Alkaline Phosphatase 104 38 - 126 U/L   Total Bilirubin 1.3 (H) 0.3 - 1.2 mg/dL   GFR calc non Af Amer 57 (L) >60 mL/min   GFR calc Af Amer >60 >60 mL/min    Anion gap 14 5 - 15  CBC WITH DIFFERENTIAL  Result Value Ref Range   WBC 12.2 (H) 4.0 - 10.5 K/uL   RBC 4.37 3.87 - 5.11 MIL/uL   Hemoglobin 10.9 (L) 12.0 - 15.0 g/dL   HCT 09.8 (L) 11.9 - 14.7 %   MCV 77.8 (L) 78.0 - 100.0 fL   MCH 24.9 (L) 26.0 - 34.0 pg   MCHC 32.1 30.0 - 36.0 g/dL   RDW 82.9 (H) 56.2 - 13.0 %   Platelets 220 150 - 400 K/uL   Neutrophils Relative % PENDING %   Neutro Abs PENDING 1.7 - 7.7 K/uL   Band Neutrophils PENDING %   Lymphocytes Relative PENDING %   Lymphs Abs PENDING 0.7 - 4.0 K/uL   Monocytes Relative PENDING %   Monocytes Absolute PENDING 0.1 - 1.0 K/uL   Eosinophils Relative PENDING %   Eosinophils Absolute PENDING 0.0 - 0.7 K/uL   Basophils Relative PENDING %   Basophils Absolute PENDING 0.0 - 0.1 K/uL   WBC Morphology PENDING    RBC Morphology PENDING    Smear Review PENDING    nRBC PENDING 0 /100 WBC   Metamyelocytes Relative PENDING %   Myelocytes PENDING %   Promyelocytes Absolute PENDING %   Blasts PENDING %  POC occult blood, ED Provider will collect  Result Value Ref Range   Fecal Occult Bld NEGATIVE NEGATIVE  I-Stat CG4 Lactic Acid, ED  (not at  [facility])  Result Value Ref Range   Lactic Acid, Venous 5.07 (HH) 0.5 - 1.9 mmol/L   Comment NOTIFIED PHYSICIAN   Type and screen  Result Value Ref Range   ABO/RH(D) B POS    Antibody Screen NEG    Sample Expiration 06/21/2016   ABO/Rh  Result Value Ref Range   ABO/RH(D) B POS    Dg Chest Portable 1 View  Result Date: 06/18/2016 CLINICAL DATA:  Shortness of Breath EXAM: PORTABLE CHEST  1 VIEW COMPARISON:  February 06, 2016 FINDINGS: There is patchy airspace opacity throughout much of the right upper lobe. Lungs elsewhere are clear. There is cardiomegaly with pulmonary venous hypertension. Pacemaker leads are attached to the right atrium and right ventricle. There is atherosclerotic calcification in the aorta. No adenopathy. No bone lesions. IMPRESSION: Right upper lobe airspace opacity,  likely pneumonia. Lungs elsewhere clear. There is pulmonary vascular congestion which appear stable. Pacemaker leads unchanged. There is aortic atherosclerosis. Followup PA and lateral chest radiographs recommended in 3-4 weeks following trial of antibiotic therapy to ensure resolution and exclude underlying malignancy. Electronically Signed   By: Bretta BangWilliam  Woodruff III M.D.   On: 06/18/2016 10:52   Dg Knee Complete 4 Views Right  Result Date: 06/17/2016 CLINICAL DATA:  Right knee pain and swelling without known injury. EXAM: RIGHT KNEE - COMPLETE 4+ VIEW COMPARISON:  None. FINDINGS: No fracture or dislocation is noted. Moderate suprapatellar joint effusion is noted. Vascular calcifications are noted. No significant joint space narrowing is noted. Chondrocalcinosis is noted laterally. IMPRESSION: Moderate suprapatellar joint effusion. No other acute abnormality seen in the right knee. Electronically Signed   By: Lupita RaiderJames  Green Jr, M.D.   On: 06/17/2016 15:14    EKG  EKG Interpretation  Date/Time:  Thursday June 18 2016 10:25:12 EDT Ventricular Rate:  61 PR Interval:    QRS Duration: 174 QT Interval:  506 QTC Calculation: 510 R Axis:   -86 Text Interpretation:  Electronic ventricular pacemaker Confirmed by Denton LankSTEINL  MD, Caryn BeeKEVIN ([id number]) on 06/18/2016 10:32:14 AM Also confirmed by Denton LankSTEINL  MD, Caryn BeeKEVIN ([id number]), editor [place] CT, CalhounMarilyn [phone]([id number])  on 06/18/2016 11:31:29 AM       Radiology Dg Chest Portable 1 View  Result Date: 06/18/2016 CLINICAL DATA:  Shortness of Breath EXAM: PORTABLE CHEST 1 VIEW COMPARISON:  February 06, 2016 FINDINGS: There is patchy airspace opacity throughout much of the right upper lobe. Lungs elsewhere are clear. There is cardiomegaly with pulmonary venous hypertension. Pacemaker leads are attached to the right atrium and right ventricle. There is atherosclerotic calcification in the aorta. No adenopathy. No bone lesions. IMPRESSION: Right upper lobe airspace opacity, likely  pneumonia. Lungs elsewhere clear. There is pulmonary vascular congestion which appear stable. Pacemaker leads unchanged. There is aortic atherosclerosis. Followup PA and lateral chest radiographs recommended in 3-4 weeks following trial of antibiotic therapy to ensure resolution and exclude underlying malignancy. Electronically Signed   By: Bretta BangWilliam  Woodruff III M.D.   On: 06/18/2016 10:52   Dg Knee Complete 4 Views Right  Result Date: 06/17/2016 CLINICAL DATA:  Right knee pain and swelling without known injury. EXAM: RIGHT KNEE - COMPLETE 4+ VIEW COMPARISON:  None. FINDINGS: No fracture or dislocation is noted. Moderate suprapatellar joint effusion is noted. Vascular calcifications are noted. No significant joint space narrowing is noted. Chondrocalcinosis is noted laterally. IMPRESSION: Moderate suprapatellar joint effusion. No other acute abnormality seen in the right knee. Electronically Signed   By: Lupita RaiderJames  Green Jr, M.D.   On: 06/17/2016 15:14    Procedures Procedures (including critical care time)  Medications Ordered in ED Medications  vancomycin (VANCOCIN) IVPB 1000 mg/200 mL premix (1,000 mg Intravenous New Bag/Given 06/18/16 1156)  vancomycin (VANCOCIN) IVPB 750 mg/150 ml premix (not administered)  sodium chloride 0.9 % bolus 1,000 mL (1,000 mLs Intravenous New Bag/Given 06/18/16 1133)  piperacillin-tazobactam (ZOSYN) IVPB 3.375 g (3.375 g Intravenous New Bag/Given 06/18/16 1156)  sodium chloride 0.9 % bolus 1,000 mL (1,000 mLs Intravenous New Bag/Given 06/18/16 1157)  And  sodium chloride 0.9 % bolus 250 mL (250 mLs Intravenous New Bag/Given 06/18/16 1157)  HYDROmorphone (DILAUDID) injection 0.5 mg (0.5 mg Intravenous Given 06/18/16 1156)  ondansetron (ZOFRAN) injection 4 mg (4 mg Intravenous Given 06/18/16 1156)     Initial Impression / Assessment and Plan / ED Course  I have reviewed the triage vital signs and the nursing notes.  Pertinent labs & imaging results that were available  during my care of the patient were reviewed by me and considered in my medical decision making (see chart for details).  Iv ns. o2 [place]. Labs. Cultures. Continuous pulse ox and monitor. ecg.   Stat labs and stat portable cxr.  cxr with infiltrate/pna.    Iv abx.  bp low 87/46.  Iv ns boluses.   Lactate high.  30 cc/kg ns bolus.   Reviewed nursing notes and prior charts for additional history.   Reviewed labs from yesterday, arthrocentesis w 23K wbc, which would most likely be c/w inflamm process.  Given lactate, pna on cxr, could also represent infection.  willl admit to med service.  Will consult ortho re knee pain/effusion, elev wbc.   CRITICAL CARE  RE pneumonia, dyspnea, hemoptysis, hypotension and elevated lactate suspect sepsis/septic shock, right knee effusion/pain.  Performed by: Suzi Roots Total critical care time: 40 minutes Critical care time was exclusive of separately billable procedures and treating other patients. Critical care was necessary to treat or prevent imminent or life-threatening deterioration. Critical care was time spent personally by me on the following activities: development of treatment plan with patient and/or surrogate as well as nursing, discussions with consultants, evaluation of patient's response to treatment, examination of patient, obtaining history from patient or surrogate, ordering and performing treatments and interventions, ordering and review of laboratory studies, ordering and review of radiographic studies, pulse oximetry and re-evaluation of patient's condition.   Pt/family indicates has seen Dr Ranell Patrick in past w left knee pain.  I discussed pts arthrocentesis yesterday with on call MD, Dr Charlann Boxer, incl 23K wbc, 96% pmn - he indicates that is consistent with inflamm process and would just have medicine service f/u on already sent culture, but no other rx for knee today.     Final Clinical Impressions(s) / ED Diagnoses   Final diagnoses:    None    New Prescriptions New Prescriptions   No medications on file         Cathren Laine, MD 06/18/16 1418

## 2016-06-18 NOTE — Progress Notes (Signed)
Pharmacy Antibiotic Note  Randel PiggCharlotte Dace is a 83 y.o. female admitted on 06/18/2016 with sepsis.  Pharmacy has been consulted for vancomycin/zosyn dosing. Afebrile, WBC 12.2, LA 5.07. SCr 0.88 on admit (baseline ~0.5-0.6), CrCl~45.  Zosyn 30 minute infusion and vancomycin 1g IV x 1 already started in the ED. Communicated with RN regarding additional load.  Plan: Zosyn 3.375g IV (30min inf) x1; then 3.375g IV q8h (4h inf) Vancomycin 750mg  IV x1 (to equal total 1750mg  load); then 750mg  IV q12h Monitor clinical progress, c/s, renal function, abx plan/LOT Vancomycin trough as indicated   Height: 5\' 6"  (167.6 cm) Weight: 165 lb (74.8 kg) IBW/kg (Calculated) : 59.3  Temp (24hrs), Avg:98.4 F (36.9 C), Min:98.3 F (36.8 C), Max:98.5 F (36.9 C)   Recent Labs Lab 06/17/16 1433 06/18/16 1144  WBC 9.0  --   CREATININE 0.62  --   LATICACIDVEN  --  5.07*    Estimated Creatinine Clearance: 49.3 mL/min (by C-G formula based on SCr of 0.62 mg/dL).    No Known Allergies  Babs BertinHaley Rida Loudin, PharmD, BCPS Clinical Pharmacist 06/18/2016 11:59 AM

## 2016-06-18 NOTE — Progress Notes (Signed)
CRITICAL VALUE ALERT  Critical value received:  Lactic Acid 4.0  Date of notification:  06/18/2016  Time of notification:  1940  Critical value read back:Yes.    Nurse who received alert:  Raymon MuttonGwen Athalia Setterlund RN  MD notified (1st page):  Dr Mickle MalloryHamad  Time of first page:  1948  Responding MD:  Dr Mickle MalloryHamad  Time MD responded:  [phone]

## 2016-06-18 NOTE — ED Notes (Signed)
EDP at bedside  

## 2016-06-18 NOTE — Progress Notes (Signed)
ANTICOAGULATION CONSULT NOTE  Pharmacy Consult for Xarelto Indication: atrial fibrillation   Assessment: 89 yof on Xarelto PTA for afib. Pharmacy consulted to dose inpatient. SCr 0.88 on admit (appears at baseline), CrCl~45 based on TBW. Hg low stable 10.9, plt wnl. No bleeding documented.  Last dose 3/28 PTA per med rec.  Goal of Therapy:  Stroke prevention Monitor platelets by anticoagulation protocol: Yes   Plan:  Adjust Xarelto 15mg  PO Qsupper for CrCl<50 Monitor SCr, CBC, s/sx bleeding  Babs BertinHaley Magnum Lunde, PharmD, BCPS Clinical Pharmacist 06/18/2016 1:52 PM

## 2016-06-18 NOTE — Progress Notes (Signed)
CRITICAL VALUE ALERT  Critical value received: Troponin 0.20  Date of notification:  06/18/2016  Time of notification:  1625  Critical value read back:Yes.    Nurse who received alert:  Raymon MuttonGwen Huong Luthi RN  MD notified (1st page):  Dr Melynda RippleHobbs  Time of first page:  1629  Responding MD: Dr Melynda RippleHobbs  Time MD responded:  1630

## 2016-06-18 NOTE — ED Triage Notes (Signed)
Pt. Coming from home via [facility] for shortness of breath. Upon arrival EMS noted dried dark blood around mouth. Pt. Also states she has been vomiting blood and having dark stools starting today. Pt. On xarelto and drinks ETOH every day. EMS noted bilateral wheezing in all lung fields. Patient initial O2 satuation 78%. Pt. Placed on CPAP and given 10mg  albuterol, 0.5mg  atrovent, and 125 solumedrol. Pt. Breath sounds improved. EMS reports mass to epigastric region, but patient could not tell them what it was.

## 2016-06-19 ENCOUNTER — Inpatient Hospital Stay (HOSPITAL_COMMUNITY): Payer: Medicare Other

## 2016-06-19 DIAGNOSIS — I361 Nonrheumatic tricuspid (valve) insufficiency: Secondary | ICD-10-CM

## 2016-06-19 DIAGNOSIS — I5032 Chronic diastolic (congestive) heart failure: Secondary | ICD-10-CM

## 2016-06-19 DIAGNOSIS — J189 Pneumonia, unspecified organism: Secondary | ICD-10-CM

## 2016-06-19 DIAGNOSIS — A419 Sepsis, unspecified organism: Principal | ICD-10-CM

## 2016-06-19 DIAGNOSIS — M25461 Effusion, right knee: Secondary | ICD-10-CM

## 2016-06-19 DIAGNOSIS — I4891 Unspecified atrial fibrillation: Secondary | ICD-10-CM

## 2016-06-19 DIAGNOSIS — I5033 Acute on chronic diastolic (congestive) heart failure: Secondary | ICD-10-CM

## 2016-06-19 DIAGNOSIS — I482 Chronic atrial fibrillation: Secondary | ICD-10-CM

## 2016-06-19 DIAGNOSIS — J9601 Acute respiratory failure with hypoxia: Secondary | ICD-10-CM

## 2016-06-19 LAB — CBC
HEMATOCRIT: 29.3 % — AB (ref 36.0–46.0)
HEMOGLOBIN: 9.1 g/dL — AB (ref 12.0–15.0)
MCH: 24 pg — ABNORMAL LOW (ref 26.0–34.0)
MCHC: 31.1 g/dL (ref 30.0–36.0)
MCV: 77.3 fL — ABNORMAL LOW (ref 78.0–100.0)
Platelets: 164 10*3/uL (ref 150–400)
RBC: 3.79 MIL/uL — ABNORMAL LOW (ref 3.87–5.11)
RDW: 19.8 % — ABNORMAL HIGH (ref 11.5–15.5)
WBC: 14.3 10*3/uL — ABNORMAL HIGH (ref 4.0–10.5)

## 2016-06-19 LAB — ECHOCARDIOGRAM COMPLETE
AVLVOTPG: 2 mmHg
CHL CUP DOP CALC LVOT VTI: 15.4 cm
E decel time: 332 msec
E/e' ratio: 10.68
FS: 24 % — AB (ref 28–44)
HEIGHTINCHES: 66 in
IVS/LV PW RATIO, ED: 0.74
LA ID, A-P, ES: 48 mm
LA diam end sys: 48 mm
LA vol index: 48.6 mL/m2
LA vol: 92.8 mL
LADIAMINDEX: 2.51 cm/m2
LAVOLA4C: 83 mL
LV PW d: 14.5 mm — AB (ref 0.6–1.1)
LV TDI E'LATERAL: 11.7
LVEEAVG: 10.68
LVEEMED: 10.68
LVELAT: 11.7 cm/s
LVOT area: 2.54 cm2
LVOT diameter: 18 mm
LVOT peak vel: 64.3 cm/s
LVOTSV: 39 mL
MV Dec: 332
MV Peak grad: 6 mmHg
MV pk E vel: 125 m/s
MVPKAVEL: 48.4 m/s
RV LATERAL S' VELOCITY: 8.59 cm/s
RV TAPSE: 28.7 mm
RV sys press: 30 mmHg
Reg peak vel: 194 cm/s
TDI e' medial: 5.98
TRMAXVEL: 194 cm/s
WEIGHTICAEL: 2857.6 [oz_av]

## 2016-06-19 LAB — TYPE AND SCREEN
ABO/RH(D): B POS
ANTIBODY SCREEN: NEGATIVE

## 2016-06-19 LAB — BASIC METABOLIC PANEL
ANION GAP: 8 (ref 5–15)
BUN: 22 mg/dL — ABNORMAL HIGH (ref 6–20)
CHLORIDE: 105 mmol/L (ref 101–111)
CO2: 22 mmol/L (ref 22–32)
Calcium: 9.2 mg/dL (ref 8.9–10.3)
Creatinine, Ser: 0.98 mg/dL (ref 0.44–1.00)
GFR calc Af Amer: 58 mL/min — ABNORMAL LOW (ref >60)
GFR, EST NON AFRICAN AMERICAN: 50 mL/min — AB (ref >60)
GLUCOSE: 162 mg/dL — AB (ref 65–99)
POTASSIUM: 4.3 mmol/L (ref 3.5–5.1)
SODIUM: 135 mmol/L (ref 135–145)

## 2016-06-19 LAB — TROPONIN I: Troponin I: 0.07 ng/mL (ref <0.03)

## 2016-06-19 MED ORDER — HYDROCODONE-ACETAMINOPHEN 5-325 MG PO TABS
1.0000 | ORAL_TABLET | Freq: Four times a day (QID) | ORAL | Status: DC | PRN
  Administered 2016-06-19 – 2016-06-20 (×5): 2 via ORAL
  Administered 2016-06-21 (×2): 1 via ORAL
  Administered 2016-06-21 – 2016-06-22 (×6): 2 via ORAL
  Administered 2016-06-23 (×2): 1 via ORAL
  Administered 2016-06-23 (×2): 2 via ORAL
  Administered 2016-06-24: 1 via ORAL
  Filled 2016-06-19 (×4): qty 2
  Filled 2016-06-19: qty 1
  Filled 2016-06-19 (×6): qty 2
  Filled 2016-06-19 (×2): qty 1
  Filled 2016-06-19 (×4): qty 2

## 2016-06-19 MED ORDER — FUROSEMIDE 10 MG/ML IJ SOLN: 40.0000 mg | Freq: Two times a day (BID) | INTRAMUSCULAR | Status: DC

## 2016-06-19 MED ORDER — ROSUVASTATIN CALCIUM 10 MG PO TABS
10.0000 mg | ORAL_TABLET | ORAL | Status: DC
  Administered 2016-06-19 – 2016-06-22 (×2): 10 mg via ORAL
  Filled 2016-06-19 (×2): qty 1

## 2016-06-19 MED ORDER — FUROSEMIDE 10 MG/ML IJ SOLN
40.0000 mg | Freq: Two times a day (BID) | INTRAMUSCULAR | Status: AC
  Administered 2016-06-19 – 2016-06-20 (×2): 40 mg via INTRAVENOUS
  Filled 2016-06-19 (×2): qty 4

## 2016-06-19 MED ORDER — CALCIUM CARBONATE ANTACID 500 MG PO CHEW
1.0000 | CHEWABLE_TABLET | ORAL | Status: DC | PRN
  Administered 2016-06-19 (×2): 200 mg via ORAL
  Filled 2016-06-19 (×2): qty 1

## 2016-06-19 MED ORDER — ZOLPIDEM TARTRATE 5 MG PO TABS
5.0000 mg | ORAL_TABLET | Freq: Every evening | ORAL | Status: DC | PRN
  Filled 2016-06-19: qty 1

## 2016-06-19 NOTE — Progress Notes (Addendum)
Patient ID: Beth Santiago, female   DOB: [birth year], 83 y.o.   MRN: [id number]  PROGRESS NOTE    Beth Santiago  FYB:[id number] DOB: 07-19-1924 DOA: 06/18/2016  PCP: Sheela Stack, MD   Brief Narrative:  83 y.o. female  significant for dementia, atrial fibrillation on xarelto, coronary disease, hypertension. Pt presented with right knee pain and swelling, weakness. Knee was tapped in ED. CXR showed right upper lobe airspace opacity, likely pneumonia. Her UA showed small leukocytes. She was started on broad spectrum abx while awaiting culture results.   Assessment & Plan:   Principal Problem:   Sepsis due to pneumonia and UTI ([place]) / Right upper lobe pneumonia / Leukocytosis  - Sepsis criteria met on admission - Source of infection pneumonia and UTI - Blood cx negative so far - Urine cx reincubated for better growth  - Continue current abx - Repeat lacitc acid in am - Continue to monitor on tele  Active Problems:   Moderate right knee effusion - Tapped in ED - Spoke with ortho on call, dose not look septic and would just treat conservatively  - Continue current abx  - Pt has less pain in the right knee    Troponin elevation - Likely demand ischemia from sepsis - Cardio consulted  - Continue aspirin daily     HLD (hyperlipidemia) - Continue Crestor    Hypothyroidism - Continue synthroid     Atrial fibrillation, chronic (HCC) - CHADS vasc score 4 - Continue xarelto - HR 60, without BB    Chronic diastolic CHF (congestive heart failure) (HCC) - Appreciate cardio input   DVT prophylaxis: On xarelto  Code Status: full code  Family Communication: daughter in law at bedside this am Disposition Plan: home or SNF once sepsis etiology resolves    Consultants:   Cardio  Ortho   Procedures:   ECHO - pending   Antimicrobials:   Vanco and zosyn 06/18/2016 -->    Subjective: No overnight events.   Objective: Vitals:   06/19/16 0759 06/19/16 0859  06/19/16 1148 06/19/16 1426  BP: 105/60  105/60   Pulse:   60   Resp:      Temp: 97.5 F (36.4 C)  98 F (36.7 C)   TempSrc: Oral  Oral   SpO2: 100% 100% 100% 100%  Weight:      Height:        Intake/Output Summary (Last 24 hours) at 06/19/16 1525 Last data filed at 06/19/16 0600  Gross per 24 hour  Intake             1485 ml  Output              300 ml  Net             1185 ml   Filed Weights   06/18/16 1024 06/19/16 0412  Weight: 74.8 kg (165 lb) 81 kg (178 lb 9.6 oz)    Examination:  General exam: Appears calm and comfortable  Respiratory system: diminished, wheezing in upper lung lobes  Cardiovascular system: S1 & S2 heard, Rate controlled  Gastrointestinal system: Abdomen is nondistended, soft and nontender. No organomegaly or masses felt. Normal bowel sounds heard. Central nervous system: No focal neurological deficits. Extremities: right knee swollen, less tender to touch  Skin: No rashes, lesions or ulcers Psychiatry: Mood & affect appropriate.   Data Reviewed: I have personally reviewed following labs and imaging studies  CBC:  Recent Labs Lab 06/17/16 1433 06/18/16 1121 06/18/16 2016 06/19/16  0407  WBC 9.0 12.2*  --  14.3*  NEUTROABS 6.9 11.4*  --   --   HGB 10.7* 10.9* 9.8* 9.1*  HCT 34.1* 34.0* 31.5* 29.3*  MCV 76.1* 77.8*  --  77.3*  PLT 219 220  --  267   Basic Metabolic Panel:  Recent Labs Lab 06/17/16 1433 06/18/16 1121 06/18/16 1523 06/19/16 0407  NA 135 134*  --  135  K 4.2 3.8  --  4.3  CL 103 101  --  105  CO2 24 19*  --  22  GLUCOSE 159* 181*  --  162*  BUN 15 17  --  22*  CREATININE 0.62 0.88  --  0.98  CALCIUM 9.4 9.5  --  9.2  MG  --   --  1.8  --   PHOS  --   --  3.6  --    GFR: Estimated Creatinine Clearance: 41.8 mL/min (by C-G formula based on SCr of 0.98 mg/dL). Liver Function Tests:  Recent Labs Lab 06/18/16 1121  AST 43*  ALT 18  ALKPHOS 104  BILITOT 1.3*  PROT 6.9  ALBUMIN 3.6   No results for  input(s): LIPASE, AMYLASE in the last 168 hours. No results for input(s): AMMONIA in the last 168 hours. Coagulation Profile: No results for input(s): INR, PROTIME in the last 168 hours. Cardiac Enzymes:  Recent Labs Lab 06/18/16 1523 06/18/16 1757  TROPONINI 0.20* 0.21*   BNP (last 3 results) No results for input(s): PROBNP in the last 8760 hours. HbA1C: No results for input(s): HGBA1C in the last 72 hours. CBG: No results for input(s): GLUCAP in the last 168 hours. Lipid Profile: No results for input(s): CHOL, HDL, LDLCALC, TRIG, CHOLHDL, LDLDIRECT in the last 72 hours. Thyroid Function Tests: No results for input(s): TSH, T4TOTAL, FREET4, T3FREE, THYROIDAB in the last 72 hours. Anemia Panel: No results for input(s): VITAMINB12, FOLATE, FERRITIN, TIBC, IRON, RETICCTPCT in the last 72 hours. Urine analysis:    Component Value Date/Time   COLORURINE AMBER (A) 06/18/2016 1238   APPEARANCEUR HAZY (A) 06/18/2016 1238   LABSPEC 1.023 06/18/2016 1238   PHURINE 5.0 06/18/2016 1238   GLUCOSEU >=500 (A) 06/18/2016 1238   HGBUR NEGATIVE 06/18/2016 1238   BILIRUBINUR NEGATIVE 06/18/2016 1238   KETONESUR NEGATIVE 06/18/2016 1238   PROTEINUR 30 (A) 06/18/2016 1238   UROBILINOGEN 0.2 12/09/2012 1401   NITRITE NEGATIVE 06/18/2016 1238   LEUKOCYTESUR SMALL (A) 06/18/2016 1238   Sepsis Labs: @LABRCNTIP (procalcitonin:4,lacticidven:4)    Body fluid culture     Status: None (Preliminary result)   Collection Time: 06/17/16  6:05 PM  Result Value Ref Range Status   Specimen Description KNEE RIGHT  Final   Special Requests NONE  Final   Gram Stain   Final   Culture   Final    NO GROWTH 2 DAYS Performed at [facility], [place] [address] [place], [place] [address]    Report Status PENDING  Incomplete  Blood Culture (routine x 2)     Status: None (Preliminary result)   Collection Time: 06/18/16 11:30 AM  Result Value Ref Range Status   Specimen Description BLOOD RIGHT HAND   Final   Special Requests BOTTLES DRAWN AEROBIC AND ANAEROBIC  5CC  Final   Culture NO GROWTH < 12 HOURS  Final   Report Status PENDING  Incomplete  Blood Culture (routine x 2)     Status: None (Preliminary result)   Collection Time: 06/18/16 11:35 AM  Result  Value Ref Range Status   Specimen Description BLOOD RIGHT HAND  Final   Special Requests BOTTLES DRAWN AEROBIC AND ANAEROBIC  5CC  Final   Culture NO GROWTH < 12 HOURS  Final   Report Status PENDING  Incomplete  Culture, Urine     Status: None (Preliminary result)   Collection Time: 06/18/16 12:38 PM  Result Value Ref Range Status   Specimen Description URINE, RANDOM  Final   Special Requests NONE  Final   Culture CULTURE REINCUBATED FOR BETTER GROWTH  Final   Report Status PENDING  Incomplete  MRSA PCR Screening     Status: None   Collection Time: 06/18/16  4:54 PM  Result Value Ref Range Status   MRSA by PCR NEGATIVE NEGATIVE Final      Radiology Studies: Portable Chest 1 View Result Date: 06/19/2016 Significant improvement in infiltrates as well as vascular congestion when compared with the prior exam.   Dg Chest Portable 1 View Result Date: 06/18/2016 Right upper lobe airspace opacity, likely pneumonia. Lungs elsewhere clear. There is pulmonary vascular congestion which appear stable. Pacemaker leads unchanged. There is aortic atherosclerosis. Followup PA and lateral chest radiographs recommended in 3-4 weeks following trial of antibiotic therapy to ensure resolution and exclude underlying malignancy.   Dg Knee Complete 4 Views Right Result Date: 06/17/2016 Moderate suprapatellar joint effusion. No other acute abnormality seen in the right knee. Electronically Signed   By: Marijo Conception, M.D.   On: 06/17/2016 15:14      Scheduled Meds: . aspirin  325 mg Oral Daily  . guaiFENesin  600 mg Oral BID  . ipratropium-albutero  3 mL Nebulization TID  . levothyroxine  88 mcg Oral QAC breakfast  . pantoprazole  40 mg Oral  Daily  . piperacillin-tazobactam (ZOSYN)  IV  3.375 g Intravenous Q8H  . potassium chloride   20 mEq Oral Daily  . Rivaroxaban  20 mg Oral Q supper  . rosuvastatin  10 mg Oral  Mon Wed Fri  . vancomycin  750 mg Intravenous Q12H   Continuous Infusions:   LOS: 1 day    Time spent: 25 minutes  Greater than 50% of the time spent on counseling and coordinating the care.   Leisa Lenz, MD [facility] Pager [phone]  If 7PM-7AM, please contact night-coverage [email or web address] Password [email or web address] 06/19/2016, 3:25 PM

## 2016-06-19 NOTE — Progress Notes (Signed)
  Echocardiogram 2D Echocardiogram has been performed.  Delcie Roch 06/19/2016, 11:07 AM

## 2016-06-19 NOTE — Consult Note (Signed)
Advanced Heart Failure Team Consult Note   Primary Physician: [name], MD  Primary Cardiologist:  Dr. Gala Romney   Reason for Consultation: SOB  HPI:    Beth Santiago is seen today for evaluation of SOB/acute CHF at the request of Dr. Elisabeth Pigeon.   Beth Santiago is a 83 year old female with a past medical history of HTN, HL, permanent AF, CHB s/p pacer and non-obstructive CAD by cath in 2007 with EF 55-60%.  Last seen in the HF clinic in Feb. 2018, was doing well. Weight and volume status was stable on  Lasix daily. Weight that visit was 165 lbs.   On 3/28 she had a left knee arthrocentesis in the ED the day prior to admission, fluid was sent for culture and there is no growth to date.   She re-presented to the ED On 06/18/16 with SOB and right knee pain. Also had one episode of hemoptysis.  Chest x ray showed diffuse right upper lobe PNA (with question of possible mild CHF) and she was started on Zoysn and Vanc. Pertinent admission labs - WBC 12.2, lactic acid 5.07, creatinine 0.88, hgb 10.9. Sepsis protocol was initiated, sputum and blood cultures.   Weight today is up to 178 pounds, has gotten approx. 4 L of fluid so far. Chest X ray improved today, but still requiring supplemental oxygen at 4L. She denies orthopnea, is lying mostly flat in the bed. She denies chest pain, no productive cough. Family is at the bedside, her son and daughter in law. They say she was feeling well up until a few days ago when she became progressively weak. Patient tells me that she has been drinking more than 2L a day, not really following a low salt diet. Denies productive cough. + chills and subjective fever. Has not received lasix.   Echo done today shows an EF of 55-60%, grade 3 diastolic dysfunction with severe RA and IVC dilatation consistent with volume overload.    Review of Systems: [y] = yes,  = no   General: Weight gain Cove.Etienne ]; Weight loss ; Anorexia ; Fatigue Cove.Etienne ]; Fever  [ y]; Chills Cove.Etienne ]; Weakness   Cardiac: Chest pain/pressure ; Resting SOB ; Exertional SOB [ y]; Orthopnea ; Pedal Edema ; Palpitations ; Syncope ; Presyncope ; Paroxysmal nocturnal dyspnea[ ]   Pulmonary: Cough Cove.Etienne ]; Wheezing[ ] ; Hemoptysis[y ]; Sputum ; Snoring   GI: Vomiting[ ] ; Dysphagia[ ] ; Melena[ ] ; Hematochezia ; Heartburn[ ] ; Abdominal pain ; Constipation ; Diarrhea ; BRBPR   GU: Hematuria[ ] ; Dysuria ; Nocturia[ ]   Vascular: Pain in legs with walking ; Pain in feet with lying flat ; Non-healing sores ; Stroke ; TIA ; Slurred speech ;  Neuro: Headaches[ ] ; Vertigo[ ] ; Seizures[ ] ; Paresthesias[ ] ;Blurred vision ; Diplopia ; Vision changes   Ortho/Skin: Arthritis [ y]; Joint pain Cove.Etienne ]; Muscle pain ; Joint swelling Cove.Etienne ]; Back Pain ; Rash   Psych: Depression[ ] ; Anxiety[ ]   Heme: Bleeding problems ; Clotting disorders ; Anemia   Endocrine: Diabetes ; Thyroid dysfunction[ ]   Home Medications Prior to Admission medications   Medication Sig Start Date End Date Taking? Authorizing Provider  amLODipine (NORVASC) 5 MG tablet TAKE 1 TABLET BY MOUTH DAILY 12/09/15  Yes [name], MD  BYSTOLIC 5 MG tablet TAKE 1/2 TABLET BY MOUTH DAILY 08/16/15  Yes [name], MD  fish oil-omega-3 fatty acids 1000 MG capsule Take 1 g by mouth daily.    Yes Historical Provider, MD  furosemide (LASIX) 40 MG tablet Take 1 tablet (40 mg total) by mouth daily. 03/11/16  Yes Dolores Patty, MD  ibuprofen (ADVIL,MOTRIN) 200 MG tablet Take 400 mg by mouth every 6 (six) hours as needed for moderate pain.   Yes Historical Provider, MD  latanoprost (XALATAN) 0.005 % ophthalmic solution Place 1 drop into both eyes at bedtime.     Yes Historical Provider, MD  levothyroxine (SYNTHROID, LEVOTHROID) 88 MCG tablet Take 1 mcg by mouth daily.  03/13/14  Yes Historical Provider, MD  Multiple Vitamins-Minerals (CENTRUM SILVER PO) Take  1 tablet by mouth daily.     Yes Historical Provider, MD  omeprazole (PRILOSEC) 20 MG capsule Take 20 mg by mouth 2 (two) times daily.    Yes Historical Provider, MD  potassium chloride SA (K-DUR,KLOR-CON) 20 MEQ tablet Take 1 tablet (20 mEq total) by mouth daily. 02/06/16  Yes Dolores Patty, MD  rosuvastatin (CRESTOR) 20 MG tablet Take 10 mg by mouth 3 (three) times a week. Andris Flurry and Sat   Yes Historical Provider, MD  timolol (BETIMOL) 0.5 % ophthalmic solution Place 1 drop into both eyes every morning.    Yes Historical Provider, MD  traMADol (ULTRAM) 50 MG tablet Take 1 tablet (50 mg total) by mouth every 6 (six) hours as needed. 06/17/16  Yes Georgiana Shore, PA-C  XARELTO 20 MG TABS tablet TAKE 1 TABLET BY MOUTH DAILY WITH SUPPER 04/30/16  Yes [name], MD    Past Medical History: Past Medical History:  Diagnosis Date  . Atrial flutter (HCC)    a. Noted during pacemaker implantation 2011, spontaneously terminated.  . Biatrial enlargement    severe  . CAD (coronary artery disease)    a. Nonobstructive by cath 2005 - 40-50% mid LAD. b. Nonischemic nuc 05/2011.  . Carotid artery disease (HCC)    a. 1-50% bilateral (upper end) - March 2011.  Marland Kitchen CHF (congestive heart failure) (HCC)   . DJD (degenerative joint disease)   . GERD (gastroesophageal reflux disease)   . Hemoptysis 05/2016  . Hyperlipidemia   . Hypertension   . Hypothyroid   . Persistent atrial fibrillation (HCC) 05/2011  . Second degree heart block    a. s/p PPM 2005. b. Gen change to [employer] by Dr Johney Frame  9/11 for premature ERI 11/2009.    Past Surgical History: Past Surgical History:  Procedure Laterality Date  . ABDOMINAL SURGERY     twisted bowel  . CARDIOVERSION  06/11/2011   Procedure: CARDIOVERSION;  Surgeon: Dolores Patty, MD;  Location: [facility] ENDOSCOPY;  Service: Cardiovascular;  Laterality: N/A;  . CARDIOVERSION N/A 01/04/2013   Procedure: CARDIOVERSION;  Surgeon: Lars Masson, MD;   Location: [facility] ENDOSCOPY;  Service: Cardiovascular;  Laterality: N/A;  . COLON SURGERY    . HEMORROIDECTOMY    . PACEMAKER INSERTION  2005, 12/11/09   implanted by Dr Reyes Ivan, generator change 9/11 by Fawn Kirk for premature ERI  . TEE WITHOUT CARDIOVERSION  06/11/2011   Procedure: TRANSESOPHAGEAL ECHOCARDIOGRAM (TEE);  Surgeon: Dolores Patty, MD;  Location[facility] ENDOSCOPY;  Service: Cardiovascular;  Laterality: N/A;  . TONSILLECTOMY      Family History: Family History  Problem Relation Age of Onset  . Cancer Mother   . Diabetes Mother   .  Hypertension Mother   . Heart disease Father   . Cancer Brother   . Heart disease Brother     Social History: Social History   Social History  . Marital status: Widowed    Spouse name: N/A  . Number of children: N/A  . Years of education: N/A   Social History Main Topics  . Smoking status: Never Smoker  . Smokeless tobacco: Never Used  . Alcohol use Yes     Comment: 05/2016    OCCASIONAL   . Drug use: No  . Sexual activity: Not Asked   Other Topics Concern  . None   Social History Narrative  . None    Allergies:  No Known Allergies  Objective:    Vital Signs:   Temp:  [97.2 F (36.2 C)-98 F (36.7 C)] 98 F (36.7 C) (03/30 1148) Pulse Rate:  [57-64] 60 (03/30 1148) Resp:  [12-17] 17 (03/30 0500) BP: (102-116)/(53-71) 105/60 (03/30 1148) SpO2:  [94 %-100 %] 100 % (03/30 1148) FiO2 (%):  [28 %] 28 % (03/30 0859) Weight:  [178 lb 9.6 oz (81 kg)] 178 lb 9.6 oz (81 kg) (03/30 0412) Last BM Date: 06/18/16  Weight change: Filed Weights   06/18/16 1024 06/19/16 0412  Weight: 165 lb (74.8 kg) 178 lb 9.6 oz (81 kg)    Intake/Output:   Intake/Output Summary (Last 24 hours) at 06/19/16 1346 Last data filed at 06/19/16 0600  Gross per 24 hour  Intake             2485 ml  Output              300 ml  Net             2185 ml     Physical Exam: General: Elderly, pleasant female in NAD. Family at bedside  Lying flat HEENT:  normal Neck: supple. JVP to jaw. . Carotids 2+ bilat; no bruits. No lymphadenopathy or thyromegaly appreciated. Cor: PMI nondisplaced. Regular rate & rhythm. No rubs, gallops or murmurs. Lungs: Minimal crackles. No wheeze Abdomen: soft, nontender, mild distension. No hepatosplenomegaly. No bruits or masses. Good bowel sounds. Extremities: no cyanosis, clubbing, rash, trace pretibial edema. Warm. Neuro: alert & orientedx3, cranial nerves grossly intact. moves all 4 extremities w/o difficulty. Affect pleasant  Telemetry: V paced at 60, personally reviewed.   Labs: Basic Metabolic Panel:  Recent Labs Lab 06/17/16 1433 06/18/16 1121 06/18/16 1523 06/19/16 0407  NA 135 134*  --  135  K 4.2 3.8  --  4.3  CL 103 101  --  105  CO2 24 19*  --  22  GLUCOSE 159* 181*  --  162*  BUN 15 17  --  22*  CREATININE 0.62 0.88  --  0.98  CALCIUM 9.4 9.5  --  9.2  MG  --   --  1.8  --   PHOS  --   --  3.6  --     Liver Function Tests:  Recent Labs Lab 06/18/16 1121  AST 43*  ALT 18  ALKPHOS 104  BILITOT 1.3*  PROT 6.9  ALBUMIN 3.6    CBC:  Recent Labs Lab 06/17/16 1433 06/18/16 1121 06/18/16 2016 06/19/16 0407  WBC 9.0 12.2*  --  14.3*  NEUTROABS 6.9 11.4*  --   --   HGB 10.7* 10.9* 9.8* 9.1*  HCT 34.1* 34.0* 31.5* 29.3*  MCV 76.1* 77.8*  --  77.3*  PLT 219 220  --  164  Cardiac Enzymes:  Recent Labs Lab 06/18/16 1523 06/18/16 1757  TROPONINI 0.20* 0.21*    BNP: BNP (last 3 results)  Recent Labs  03/11/16 1241  BNP 570.5*    Other results: EKG: V paced.  Personally reviewed   Imaging: Portable Chest 1 View  Result Date: 06/19/2016 CLINICAL DATA:  Pneumonia EXAM: PORTABLE CHEST 1 VIEW COMPARISON:  06/18/2016 FINDINGS: Cardiac shadow remains enlarged. A pacing device is again seen and stable. Significant improvement in the degree of vascular congestion is noted. There is also significant improvement in aeration in the lungs bilaterally. Some left  basilar atelectatic changes are seen as well as mild changes in the right upper lobe. No new focal infiltrate is seen. IMPRESSION: Significant improvement in infiltrates as well as vascular congestion when compared with the prior exam. Electronically Signed   By: Alcide Clever M.D.   On: 06/19/2016 07:54      Medications:     Current Medications: . aspirin  325 mg Oral Daily  . guaiFENesin  600 mg Oral BID  . ipratropium-albuterol  3 mL Nebulization TID  . latanoprost  1 drop Both Eyes QHS  . levothyroxine  88 mcg Oral QAC breakfast  . pantoprazole  40 mg Oral Daily  . piperacillin-tazobactam (ZOSYN)  IV  3.375 g Intravenous Q8H  . potassium chloride SA  20 mEq Oral Daily  . Rivaroxaban  20 mg Oral Q supper  . rosuvastatin  10 mg Oral Once per day on Mon Wed Fri  . sodium chloride flush  3 mL Intravenous Q12H  . timolol  1 drop Both Eyes q morning - 10a  . vancomycin  750 mg Intravenous Q12H     Infusions: . sodium chloride 100 mL/hr at 06/19/16 1159      Assessment/Plan   1. Acute on chronic diastolic CHF 2. CAP 3. Acute respiratory failure 4. Permanent atrial fibrillation.  5. Nonobstructive CAD  6. Urinary tract infection 7. History of CHG s/p pacemaker  8. Elevated troponin  Ms. Deisher presented with CAP, started on sepsis protocol and now volume overloaded as expected. Her lactic acid 5.07->2.7->4.0. Continue antibiotics as ordered.   She has JVP to her jaw, would give 40mg  IV lasix BID for 2 doses and re assess her volume status in the am. Echo today with grade 3 DD and IVC dilatation.   She has chronic atrial fibrillation and is on Xarelto for anticoagulation. Renal function within normal limits.   Elevated troponin likely not representative of ACS. Will check an additional troponin, but will stop 325mg  ASA to reduce her bleeding risk since she is on Xarelto as well.     Length of Stay: 1  Little Ishikawa, NP  06/19/2016, 1:46 PM Advanced Heart Failure Team   Pager [phone] M-F 7am-4pm.  Please contact [facility] for night-coverage after hours (4p -7a ) and weekends on [email or web address]  Patient seen and examined with the above-signed Advanced Practice Provider and/or Housestaff. I personally reviewed laboratory data, imaging studies and relevant notes. I independently examined the patient and formulated the important aspects of the plan. I have edited the note to reflect any of my changes or salient points. I have personally discussed the plan with the patient and/or family.  Echo images from earlier today and CXRs viewed personally. Ms. Masse is well known to me from CHF clinic. She presents with probable RUL pneumonia with associated sepsis and lactic acidosis. She has been well resuscitated with abx and IVF. She now has  mild CHF with O2 requirment. EF normal by echo.  Will give two doses of IV lasix and follow her clinically. Follow renal function and electrolytes closely.   Agree with continuing Xarelto for AF but would stop ASA. Discussed with her family at bedside.   Arvilla Meres, MD  8:02 PM

## 2016-06-20 DIAGNOSIS — J181 Lobar pneumonia, unspecified organism: Secondary | ICD-10-CM

## 2016-06-20 DIAGNOSIS — J189 Pneumonia, unspecified organism: Secondary | ICD-10-CM

## 2016-06-20 DIAGNOSIS — I10 Essential (primary) hypertension: Secondary | ICD-10-CM

## 2016-06-20 LAB — CBC WITH DIFFERENTIAL/PLATELET
Basophils Absolute: 0 K/uL (ref 0.0–0.1)
Basophils Relative: 0 %
Eosinophils Absolute: 0.1 K/uL (ref 0.0–0.7)
Eosinophils Relative: 1 %
HCT: 28.3 % — ABNORMAL LOW (ref 36.0–46.0)
Hemoglobin: 9 g/dL — ABNORMAL LOW (ref 12.0–15.0)
Lymphocytes Relative: 11 %
Lymphs Abs: 1.3 K/uL (ref 0.7–4.0)
MCH: 24.5 pg — ABNORMAL LOW (ref 26.0–34.0)
MCHC: 31.8 g/dL (ref 30.0–36.0)
MCV: 77.1 fL — ABNORMAL LOW (ref 78.0–100.0)
Monocytes Absolute: 0.9 K/uL (ref 0.1–1.0)
Monocytes Relative: 7 %
Neutro Abs: 9.5 K/uL — ABNORMAL HIGH (ref 1.7–7.7)
Neutrophils Relative %: 81 %
Platelets: 166 K/uL (ref 150–400)
RBC: 3.67 MIL/uL — ABNORMAL LOW (ref 3.87–5.11)
RDW: 19.8 % — ABNORMAL HIGH (ref 11.5–15.5)
WBC: 11.7 K/uL — ABNORMAL HIGH (ref 4.0–10.5)

## 2016-06-20 LAB — BASIC METABOLIC PANEL
Anion gap: 8 (ref 5–15)
BUN: 21 mg/dL — ABNORMAL HIGH (ref 6–20)
CALCIUM: 8.5 mg/dL — AB (ref 8.9–10.3)
CO2: 23 mmol/L (ref 22–32)
CREATININE: 0.87 mg/dL (ref 0.44–1.00)
Chloride: 101 mmol/L (ref 101–111)
GFR calc non Af Amer: 57 mL/min — ABNORMAL LOW (ref >60)
Glucose, Bld: 120 mg/dL — ABNORMAL HIGH (ref 65–99)
Potassium: 3.6 mmol/L (ref 3.5–5.1)
SODIUM: 132 mmol/L — AB (ref 135–145)

## 2016-06-20 MED ORDER — POTASSIUM CHLORIDE CRYS ER 20 MEQ PO TBCR
40.0000 meq | EXTENDED_RELEASE_TABLET | Freq: Once | ORAL | Status: AC
  Administered 2016-06-20: 40 meq via ORAL
  Filled 2016-06-20: qty 2

## 2016-06-20 MED ORDER — FUROSEMIDE 10 MG/ML IJ SOLN
40.0000 mg | Freq: Once | INTRAMUSCULAR | Status: AC
  Administered 2016-06-20: 40 mg via INTRAVENOUS
  Filled 2016-06-20: qty 4

## 2016-06-20 MED ORDER — IPRATROPIUM-ALBUTEROL 0.5-2.5 (3) MG/3ML IN SOLN
3.0000 mL | RESPIRATORY_TRACT | Status: DC | PRN
  Administered 2016-06-20 – 2016-06-23 (×3): 3 mL via RESPIRATORY_TRACT
  Filled 2016-06-20 (×3): qty 3

## 2016-06-20 NOTE — Evaluation (Signed)
Physical Therapy Evaluation Patient Details Name: Beth Santiago MRN: [id number] DOB: 11/08/1924 Today's Date: 06/20/2016   History of Present Illness  Patient is a 83 y/o female who presents with right knee pain/swelling and SOB. Knee was tapped in ED. CXR showed right upper lobe airspace opacity, likely pneumonia. UA showed small leukocytes. Admitted with sepsis secondary to PNA and UTI.   Clinical Impression  Patient presents with right knee pain/swelling, generalized weakness/deconditioning and impaired mobility s/p above. Tolerated taking a few steps to get to chair but required Mod A due to inability to place full weight through RLE secondary to pain as well as global weakness. Pt Mod I with RW PTA. Per daughter in law, pt's daughter will not be able to provide the necessary level of assist needed at home. Pt requiring mod A for mobility at this time. Would benefit from ST SNF to maximize independence and mobility prior to return home with family. Will follow acutely.     Follow Up Recommendations SNF    Equipment Recommendations  None recommended by PT    Recommendations for Other Services       Precautions / Restrictions Precautions Precautions: Fall Restrictions Weight Bearing Restrictions: No      Mobility  Bed Mobility Overal bed mobility: Needs Assistance Bed Mobility: Supine to Sit     Supine to sit: Min assist;HOB elevated     General bed mobility comments: Increased time and cues to get to EOB. Min A for scooting bottom to EOB.   Transfers Overall transfer level: Needs assistance Equipment used: Rolling walker (2 wheeled) Transfers: Sit to/from Stand Sit to Stand: Mod assist         General transfer comment: Assist to stand from EOB wtih cues for hand placement/technique. Cues for knee extension and upright posture. Creaking noted throughout shoulder joints.  Ambulation/Gait Ambulation/Gait assistance: Mod assist Ambulation Distance (Feet): 3  Feet Assistive device: Rolling walker (2 wheeled) Gait Pattern/deviations: Step-to pattern;Decreased stance time - right;Trunk flexed Gait velocity: decreased Gait velocity interpretation: Below normal speed for age/gender General Gait Details: Able to take a few steps to get to chair with decreased WB through RLE secondary to pain, trunk flexion, assist with RW management and step by step cues for technique. Mild DOE, 2/4. VSS. Sp02 99% on RA.  Stairs            Wheelchair Mobility    Modified Rankin (Stroke Patients Only)       Balance Overall balance assessment: Needs assistance Sitting-balance support: Feet supported;No upper extremity supported       Standing balance support: During functional activity;Bilateral upper extremity supported Standing balance-Leahy Scale: Poor Standing balance comment: Reliant on BUE support in standing and Min A for balance. Cues for upright posture- flexed trunk/hip/knees.                             Pertinent Vitals/Pain Pain Assessment: Faces Faces Pain Scale: Hurts whole lot Pain Location: right knee with standing Pain Descriptors / Indicators: Grimacing;Sore;Tender Pain Intervention(s): Monitored during session;Repositioned;Limited activity within patient's tolerance;Premedicated before session    Home Living Family/patient expects to be discharged to:: Private residence Living Arrangements: Children;Spouse/significant other (spouse and daughter) Available Help at Discharge: Family;Available 24 hours/day Type of Home: House Home Access: Stairs to enter Entrance Stairs-Rails: Right Entrance Stairs-Number of Steps: 8-10 Home Layout: One level Home Equipment: Walker - 2 wheels      Prior Function Level of Independence:  Independent with assistive device(s);Needs assistance   Gait / Transfers Assistance Needed: Minimal household ambulator with RW. Needs help with stairs.   ADL's / Homemaking Assistance Needed: gets  assist with ADLs as needed. Does not do any IADls.  Comments: Pt gets assist with ADLs as needed and does not do any IADLs. Minimal household ambulator at baseline with RW.      Hand Dominance   Dominant Hand: Right    Extremity/Trunk Assessment   Upper Extremity Assessment Upper Extremity Assessment: Defer to OT evaluation;Generalized weakness    Lower Extremity Assessment Lower Extremity Assessment: Generalized weakness;RLE deficits/detail RLE Deficits / Details: Swelling, erythema present right knee. Grossly ~3/5 throughout with pain testnig knee extension. RLE Sensation:  [facility].)    Cervical / Trunk Assessment Cervical / Trunk Assessment: Kyphotic  Communication   Communication: HOH  Cognition Arousal/Alertness: Awake/alert Behavior During Therapy: WFL for tasks assessed/performed Overall Cognitive Status: History of cognitive impairments - at baseline                                 General Comments: Able to state Easter was tomorrow and that she was in the hospital but forgot why she was here.      General Comments General comments (skin integrity, edema, etc.): Daughter in law present during session.    Exercises     Assessment/Plan    PT Assessment Patient needs continued PT services  PT Problem List Decreased range of motion;Decreased strength;Decreased mobility;Decreased cognition;Decreased activity tolerance;Decreased balance;Pain       PT Treatment Interventions Therapeutic activities;Gait training;Balance training;Functional mobility training;Therapeutic exercise;Patient/family education;DME instruction    PT Goals (Current goals can be found in the Care Plan section)  Acute Rehab PT Goals Patient Stated Goal: to get stronger and be able to walk PT Goal Formulation: With patient/family Time For Goal Achievement: 07/04/16 Potential to Achieve Goals: Good    Frequency Min 2X/week   Barriers to discharge Decreased caregiver support per  daughter in law, daughter cannot provide level of physical assist needed    Co-evaluation               End of Session Equipment Utilized During Treatment: Gait belt Activity Tolerance: Patient limited by pain Patient left: in chair;with call bell/phone within reach;with chair alarm set;with family/visitor present Nurse Communication: Mobility status;Need for lift equipment (stedy ) PT Visit Diagnosis: Unsteadiness on feet (R26.81);Pain;Muscle weakness (generalized) (M62.81) Pain - Right/Left: Right Pain - part of body: Knee    Time: 5784-6962 PT Time Calculation (min) (ACUTE ONLY): 37 min   Charges:   PT Evaluation $PT Eval Low Complexity: 1 Procedure PT Treatments $Therapeutic Activity: 8-22 mins   PT G Codes:        Mylo Red, PT, DPT [phone]    Beth Santiago 06/20/2016, 12:56 PM

## 2016-06-20 NOTE — Plan of Care (Signed)
Problem: Pain Managment: Goal: General experience of comfort will improve Outcome: Progressing Pt c/o 10/10 knee pain earlier on shift. On call notified and order for pain med given. Pt states that the pain medication "helped her so much and she was finally able to get some sleep". Will continue to monitor pt and adequately control her pain.     Problem: Skin Integrity: Goal: Risk for impaired skin integrity will decrease Outcome: Progressing Pt repositioned q2h and pillows used to support bony prominences. Foam pad was placed on sacrum to prevent skin breakdown. Pt is resting comfortably in bed at this time. Will continue to monitor pt.

## 2016-06-20 NOTE — Discharge Instructions (Signed)

## 2016-06-20 NOTE — Progress Notes (Signed)
Patient ID: Beth Santiago, female   DOB: Dec 04, 1924, 83 y.o.   MRN: [id number]  PROGRESS NOTE    Beth Santiago  DXA:[id number] DOB: May 25, 1924 DOA: 06/18/2016  PCP: Sheela Stack, MD   Brief Narrative:  83 y.o. female  significant for dementia, atrial fibrillation on xarelto, coronary disease, hypertension. Pt presented with right knee pain and swelling, weakness. Knee was tapped in ED. CXR showed right upper lobe airspace opacity, likely pneumonia. Her UA showed small leukocytes. She was started on broad spectrum abx while awaiting culture results.   Assessment & Plan:   Principal Problem:   Sepsis due to pneumonia and UTI ([place]) / Right upper lobe pneumonia / Leukocytosis  - Sepsis criteria met on admission - Source of infection pneumonia and UTI - Urine cx with more than 100,000 colonies but organism not yet identified; follow up final results - Blood cx negative so far - Continue current abx: vanco and zosyn - Obtain lactic acid in am  Active Problems:   Moderate right knee effusion - Tapped in ED; cx negative so far, no crystals  - Spoke with ortho on call, dose not look septic and would just treat conservatively  - Continue current abx: vanco and zosyn  - Will see how her knee looks like in am but now no pain on palpation of the knee; will get x rays in am     Troponin elevation - Likely demand ischemia from sepsis - Cardio following  - Continue aspirin daily     HLD (hyperlipidemia) - Continue Crestor    Hypothyroidism - Continue synthroid     Atrial fibrillation, chronic (HCC) - CHADS vasc score 4 - Continue xarelto for AC - Pt HR controlled without beta blockers     Chronic diastolic CHF (congestive heart failure) (HCC) - Given one dose of lasix 40 mg IV on 3/31 - Given 2 doses of lasix 3/30 40 mg IV  - Cardio following - Continue strict intake and output  - Continue daily weight    DVT prophylaxis: On xarelto  Code Status: full code  Family  Communication: daughter in law at bedside this am Disposition Plan: home or SNF once sepsis etiology resolves    Consultants:   Cardio  Ortho   Procedures:   ECHO 3/30 - EF 55%  Antimicrobials:   Vanco and zosyn 06/18/2016 -->    Subjective: No overnight events.   Objective: Vitals:   06/20/16 0024 06/20/16 0513 06/20/16 0736 06/20/16 0834  BP: 114/62 112/68  122/84  Pulse: 63 61    Resp: 15 17    Temp: 98.7 F (37.1 C) 98.6 F (37 C)  98.4 F (36.9 C)  TempSrc:    Oral  SpO2: 100% 100% 99% 98%  Weight:  80.7 kg (178 lb)    Height:        Intake/Output Summary (Last 24 hours) at 06/20/16 1015 Last data filed at 06/20/16 0513  Gross per 24 hour  Intake               30 ml  Output             2450 ml  Net            -2420 ml   Filed Weights   06/18/16 1024 06/19/16 0412 06/20/16 0513  Weight: 74.8 kg (165 lb) 81 kg (178 lb 9.6 oz) 80.7 kg (178 lb)    Examination:  General exam: Appears calm and comfortable, no distress  Respiratory system:  diminished, less wheezing compared with yesterday exam but she still has mild wheezing in upper lung lobes  Cardiovascular system: S1 & S2 heard, Rate controlled  Gastrointestinal system: (+) BS, non tender  Central nervous system: Nonfocal Extremities: right knee swollen but not tender to touch, no erythema Skin: warm and dry Psychiatry: NOrmal mood and affect, no restlessness   Data Reviewed: I have personally reviewed following labs and imaging studies  CBC:  Recent Labs Lab 06/17/16 1433 06/18/16 1121 06/18/16 2016 06/19/16 0407 06/20/16 0323  WBC 9.0 12.2*  --  14.3* 11.7*  NEUTROABS 6.9 11.4*  --   --  9.5*  HGB 10.7* 10.9* 9.8* 9.1* 9.0*  HCT 34.1* 34.0* 31.5* 29.3* 28.3*  MCV 76.1* 77.8*  --  77.3* 77.1*  PLT 219 220  --  164 916   Basic Metabolic Panel:  Recent Labs Lab 06/17/16 1433 06/18/16 1121 06/18/16 1523 06/19/16 0407 06/20/16 0323  NA 135 134*  --  135 132*  K 4.2 3.8  --  4.3  3.6  CL 103 101  --  105 101  CO2 24 19*  --  22 23  GLUCOSE 159* 181*  --  162* 120*  BUN 15 17  --  22* 21*  CREATININE 0.62 0.88  --  0.98 0.87  CALCIUM 9.4 9.5  --  9.2 8.5*  MG  --   --  1.8  --   --   PHOS  --   --  3.6  --   --    GFR: Estimated Creatinine Clearance: 47 mL/min (by C-G formula based on SCr of 0.87 mg/dL). Liver Function Tests:  Recent Labs Lab 06/18/16 1121  AST 43*  ALT 18  ALKPHOS 104  BILITOT 1.3*  PROT 6.9  ALBUMIN 3.6   No results for input(s): LIPASE, AMYLASE in the last 168 hours. No results for input(s): AMMONIA in the last 168 hours. Coagulation Profile: No results for input(s): INR, PROTIME in the last 168 hours. Cardiac Enzymes:  Recent Labs Lab 06/18/16 1523 06/18/16 1757 06/19/16 1449  TROPONINI 0.20* 0.21* 0.07*   BNP (last 3 results) No results for input(s): PROBNP in the last 8760 hours. HbA1C: No results for input(s): HGBA1C in the last 72 hours. CBG: No results for input(s): GLUCAP in the last 168 hours. Lipid Profile: No results for input(s): CHOL, HDL, LDLCALC, TRIG, CHOLHDL, LDLDIRECT in the last 72 hours. Thyroid Function Tests: No results for input(s): TSH, T4TOTAL, FREET4, T3FREE, THYROIDAB in the last 72 hours. Anemia Panel: No results for input(s): VITAMINB12, FOLATE, FERRITIN, TIBC, IRON, RETICCTPCT in the last 72 hours. Urine analysis:    Component Value Date/Time   COLORURINE AMBER (A) 06/18/2016 1238   APPEARANCEUR HAZY (A) 06/18/2016 1238   LABSPEC 1.023 06/18/2016 1238   PHURINE 5.0 06/18/2016 1238   GLUCOSEU >=500 (A) 06/18/2016 1238   HGBUR NEGATIVE 06/18/2016 1238   BILIRUBINUR NEGATIVE 06/18/2016 1238   KETONESUR NEGATIVE 06/18/2016 1238   PROTEINUR 30 (A) 06/18/2016 1238   UROBILINOGEN 0.2 12/09/2012 1401   NITRITE NEGATIVE 06/18/2016 1238   LEUKOCYTESUR SMALL (A) 06/18/2016 1238   Sepsis Labs: @LABRCNTIP (procalcitonin:4,lacticidven:4)    Body fluid culture     Status: None (Preliminary  result)   Collection Time: 06/17/16  6:05 PM  Result Value Ref Range Status   Specimen Description KNEE RIGHT  Final   Special Requests NONE  Final   Gram Stain   Final   Culture   Final    NO  GROWTH 2 DAYS Performed at [facility], [place] [address], [place], [place] [address]    Report Status PENDING  Incomplete  Blood Culture (routine x 2)     Status: None (Preliminary result)   Collection Time: 06/18/16 11:30 AM  Result Value Ref Range Status   Specimen Description BLOOD RIGHT HAND  Final   Special Requests BOTTLES DRAWN AEROBIC AND ANAEROBIC  5CC  Final   Culture NO GROWTH < 12 HOURS  Final   Report Status PENDING  Incomplete  Blood Culture (routine x 2)     Status: None (Preliminary result)   Collection Time: 06/18/16 11:35 AM  Result Value Ref Range Status   Specimen Description BLOOD RIGHT HAND  Final   Special Requests BOTTLES DRAWN AEROBIC AND ANAEROBIC  5CC  Final   Culture NO GROWTH < 12 HOURS  Final   Report Status PENDING  Incomplete  Culture, Urine     Status: None (Preliminary result)   Collection Time: 06/18/16 12:38 PM  Result Value Ref Range Status   Specimen Description URINE, RANDOM  Final   Special Requests NONE  Final   Culture CULTURE REINCUBATED FOR BETTER GROWTH  Final   Report Status PENDING  Incomplete  MRSA PCR Screening     Status: None   Collection Time: 06/18/16  4:54 PM  Result Value Ref Range Status   MRSA by PCR NEGATIVE NEGATIVE Final      Radiology Studies: Portable Chest 1 View Result Date: 06/19/2016 Significant improvement in infiltrates as well as vascular congestion when compared with the prior exam.   Dg Chest Portable 1 View Result Date: 06/18/2016 Right upper lobe airspace opacity, likely pneumonia. Lungs elsewhere clear. There is pulmonary vascular congestion which appear stable. Pacemaker leads unchanged. There is aortic atherosclerosis. Followup PA and lateral chest radiographs recommended in 3-4 weeks following trial of  antibiotic therapy to ensure resolution and exclude underlying malignancy.   Dg Knee Complete 4 Views Right Result Date: 06/17/2016 Moderate suprapatellar joint effusion. No other acute abnormality seen in the right knee. Electronically Signed   By: Marijo Conception, M.D.   On: 06/17/2016 15:14      Scheduled Meds: . aspirin  325 mg Oral Daily  . guaiFENesin  600 mg Oral BID  . ipratropium-albutero  3 mL Nebulization TID  . levothyroxine  88 mcg Oral QAC breakfast  . pantoprazole  40 mg Oral Daily  . piperacillin-tazobactam (ZOSYN)  IV  3.375 g Intravenous Q8H  . potassium chloride   20 mEq Oral Daily  . Rivaroxaban  20 mg Oral Q supper  . rosuvastatin  10 mg Oral  Mon Wed Fri  . vancomycin  750 mg Intravenous Q12H   Continuous Infusions:   LOS: 2 days    Time spent: 25 minutes  Greater than 50% of the time spent on counseling and coordinating the care.   Leisa Lenz, MD [facility] Pager [phone]  If 7PM-7AM, please contact night-coverage [email or web address] Password [email or web address] 06/20/2016, 10:15 AM

## 2016-06-20 NOTE — Progress Notes (Signed)
Advanced Heart Failure Rounding Note   Subjective:     Feeling better. Diuresed 2.5L but weight unchanged. Denies SOB or orthopnea. No fever. Left knee very painful. Cough improved.   Creatinine stable. K 3.6   Objective:   Weight Range:  Vital Signs:   Temp:  [97.2 F (36.2 C)-98.7 F (37.1 C)] 98.4 F (36.9 C) (03/31 0834) Pulse Rate:  [59-65] 61 (03/31 0513) Resp:  [15-18] 17 (03/31 0513) BP: (105-125)/(55-84) 122/84 (03/31 0834) SpO2:  [96 %-100 %] 98 % (03/31 0834) FiO2 (%):  [28 %] 28 % (03/30 1426) Weight:  [80.7 kg (178 lb)] 80.7 kg (178 lb) (03/31 0513) Last BM Date: 06/17/16  Weight change: Filed Weights   06/18/16 1024 06/19/16 0412 06/20/16 0513  Weight: 74.8 kg (165 lb) 81 kg (178 lb 9.6 oz) 80.7 kg (178 lb)    Intake/Output:   Intake/Output Summary (Last 24 hours) at 06/20/16 0857 Last data filed at 06/20/16 0513  Gross per 24 hour  Intake               30 ml  Output             2450 ml  Net            -2420 ml     Physical Exam: General:  Elderly lying flat in bed, NAD HEENT: normal Neck: supple.JVP to jaw with prominent CV waves  Carotids 2+ bilat; no bruits. No lymphadenopathy or thryomegaly appreciated. Cor: PMI nondisplaced. Regular rate & rhythm. No rubs, gallops or murmurs. Lungs: Clear anteriorly  No wheezing  Abdomen: soft, nontender, nondistended. No hepatosplenomegaly. No bruits or masses. Good bowel sounds. Extremities: no cyanosis, clubbing, rash, edema R knee swollen and painful  Neuro: alert & orientedx3, cranial nerves grossly intact. moves all 4 extremities w/o difficulty. Affect pleasant  Telemetry: AF with v-pacing t 60  Personally reviewed   Labs: Basic Metabolic Panel:  Recent Labs Lab 06/17/16 1433 06/18/16 1121 06/18/16 1523 06/19/16 0407 06/20/16 0323  NA 135 134*  --  135 132*  K 4.2 3.8  --  4.3 3.6  CL 103 101  --  105 101  CO2 24 19*  --  22 23  GLUCOSE 159* 181*  --  162* 120*  BUN 15 17  --  22*  21*  CREATININE 0.62 0.88  --  0.98 0.87  CALCIUM 9.4 9.5  --  9.2 8.5*  MG  --   --  1.8  --   --   PHOS  --   --  3.6  --   --     Liver Function Tests:  Recent Labs Lab 06/18/16 1121  AST 43*  ALT 18  ALKPHOS 104  BILITOT 1.3*  PROT 6.9  ALBUMIN 3.6   No results for input(s): LIPASE, AMYLASE in the last 168 hours. No results for input(s): AMMONIA in the last 168 hours.  CBC:  Recent Labs Lab 06/17/16 1433 06/18/16 1121 06/18/16 2016 06/19/16 0407 06/20/16 0323  WBC 9.0 12.2*  --  14.3* 11.7*  NEUTROABS 6.9 11.4*  --   --  9.5*  HGB 10.7* 10.9* 9.8* 9.1* 9.0*  HCT 34.1* 34.0* 31.5* 29.3* 28.3*  MCV 76.1* 77.8*  --  77.3* 77.1*  PLT 219 220  --  164 166    Cardiac Enzymes:  Recent Labs Lab 06/18/16 1523 06/18/16 1757 06/19/16 1449  TROPONINI 0.20* 0.21* 0.07*    BNP: BNP (last 3 results)  Recent  Labs  03/11/16 1241  BNP 570.5*    ProBNP (last 3 results) No results for input(s): PROBNP in the last 8760 hours.    Other results:  Imaging: Portable Chest 1 View  Result Date: 06/19/2016 CLINICAL DATA:  Pneumonia EXAM: PORTABLE CHEST 1 VIEW COMPARISON:  06/18/2016 FINDINGS: Cardiac shadow remains enlarged. A pacing device is again seen and stable. Significant improvement in the degree of vascular congestion is noted. There is also significant improvement in aeration in the lungs bilaterally. Some left basilar atelectatic changes are seen as well as mild changes in the right upper lobe. No new focal infiltrate is seen. IMPRESSION: Significant improvement in infiltrates as well as vascular congestion when compared with the prior exam. Electronically Signed   By: Alcide Clever M.D.   On: 06/19/2016 07:54   Dg Chest Portable 1 View  Result Date: 06/18/2016 CLINICAL DATA:  Shortness of Breath EXAM: PORTABLE CHEST 1 VIEW COMPARISON:  February 06, 2016 FINDINGS: There is patchy airspace opacity throughout much of the right upper lobe. Lungs elsewhere are  clear. There is cardiomegaly with pulmonary venous hypertension. Pacemaker leads are attached to the right atrium and right ventricle. There is atherosclerotic calcification in the aorta. No adenopathy. No bone lesions. IMPRESSION: Right upper lobe airspace opacity, likely pneumonia. Lungs elsewhere clear. There is pulmonary vascular congestion which appear stable. Pacemaker leads unchanged. There is aortic atherosclerosis. Followup PA and lateral chest radiographs recommended in 3-4 weeks following trial of antibiotic therapy to ensure resolution and exclude underlying malignancy. Electronically Signed   By: Bretta Bang III M.D.   On: 06/18/2016 10:52      Medications:     Scheduled Medications: . aspirin  325 mg Oral Daily  . guaiFENesin  600 mg Oral BID  . ipratropium-albuterol  3 mL Nebulization TID  . latanoprost  1 drop Both Eyes QHS  . levothyroxine  88 mcg Oral QAC breakfast  . pantoprazole  40 mg Oral Daily  . piperacillin-tazobactam (ZOSYN)  IV  3.375 g Intravenous Q8H  . potassium chloride SA  20 mEq Oral Daily  . Rivaroxaban  20 mg Oral Q supper  . rosuvastatin  10 mg Oral Once per day on Mon Wed Fri  . sodium chloride flush  3 mL Intravenous Q12H  . timolol  1 drop Both Eyes q morning - 10a  . vancomycin  750 mg Intravenous Q12H     Infusions:   PRN Medications:  acetaminophen **OR** acetaminophen, albuterol, calcium carbonate, hydrALAZINE, HYDROcodone-acetaminophen, ondansetron **OR** ondansetron (ZOFRAN) IV, zolpidem   Assessment:   1. Acute on chronic diastolic CHF   --EF 60% echo 06/19/16 2. CAP 3. Acute respiratory failure 4. Permanent atrial fibrillation.  5. Nonobstructive CAD  6. Urinary tract infection 7. History of CHG s/p pacemaker  8. Elevated troponin 9. Lactic acidosis 19. Hypokalemia   Plan/Discussion:    Much improved from respiratory point of view. Neck veins still up. Will give 2 doses IV lasix today. Supp K. Watch renal function.     AF stable. Continue Xarelto.   Continue abx for PNA. Will give incentive spirometer and ask PT to see.   R know pain per primary team. May need ortho to see. Culture remains negative. No crystals on aspirate   Length of Stay: 2   Mialynn Shelvin MD 06/20/2016, 8:57 AM  Advanced Heart Failure Team Pager [phone] (M-F; 7a - 4p)  Please contact [facility] for night-coverage after hours (4p -7a ) and weekends on [email or web address]

## 2016-06-21 ENCOUNTER — Inpatient Hospital Stay (HOSPITAL_COMMUNITY): Payer: Medicare Other

## 2016-06-21 DIAGNOSIS — M25469 Effusion, unspecified knee: Secondary | ICD-10-CM

## 2016-06-21 DIAGNOSIS — E038 Other specified hypothyroidism: Secondary | ICD-10-CM

## 2016-06-21 LAB — BASIC METABOLIC PANEL
ANION GAP: 9 (ref 5–15)
BUN: 17 mg/dL (ref 6–20)
CALCIUM: 8.6 mg/dL — AB (ref 8.9–10.3)
CO2: 24 mmol/L (ref 22–32)
Chloride: 97 mmol/L — ABNORMAL LOW (ref 101–111)
Creatinine, Ser: 0.8 mg/dL (ref 0.44–1.00)
GFR calc Af Amer: >60 mL/min (ref >60)
Glucose, Bld: 141 mg/dL — ABNORMAL HIGH (ref 65–99)
Potassium: 4.3 mmol/L (ref 3.5–5.1)
Sodium: 130 mmol/L — ABNORMAL LOW (ref 135–145)

## 2016-06-21 LAB — BODY FLUID CULTURE
CULTURE: NO GROWTH
GRAM STAIN: NONE SEEN

## 2016-06-21 LAB — LACTIC ACID, PLASMA: LACTIC ACID, VENOUS: 1.2 mmol/L (ref 0.5–1.9)

## 2016-06-21 MED ORDER — FUROSEMIDE 40 MG PO TABS
40.0000 mg | ORAL_TABLET | Freq: Every day | ORAL | Status: DC
  Administered 2016-06-22: 40 mg via ORAL
  Filled 2016-06-21: qty 1

## 2016-06-21 NOTE — Progress Notes (Signed)
Advanced Heart Failure Rounding Note   Subjective:     Feels fine. Diuresed well. Weight down 4 pounds. However CXR slightly worse. (reviewed personally)   Denies fevers, chills or cough. Renal function stable. K 4.3. Lactate 1.2   Objective:   Weight Range:  Vital Signs:   Temp:  [97.7 F (36.5 C)-98.8 F (37.1 C)] 98.8 F (37.1 C) (04/01 0823) Pulse Rate:  [60-69] 60 (04/01 0823) Resp:  [15-19] 17 (04/01 0823) BP: (115-139)/(61-77) 123/61 (04/01 0823) SpO2:  [97 %-100 %] 97 % (04/01 0823) Weight:  [79 kg (174 lb 1.6 oz)] 79 kg (174 lb 1.6 oz) (04/01 0400) Last BM Date: 06/17/16  Weight change: Filed Weights   06/19/16 0412 06/20/16 0513 06/21/16 0400  Weight: 81 kg (178 lb 9.6 oz) 80.7 kg (178 lb) 79 kg (174 lb 1.6 oz)    Intake/Output:   Intake/Output Summary (Last 24 hours) at 06/21/16 1113 Last data filed at 06/21/16 0400  Gross per 24 hour  Intake                0 ml  Output              700 ml  Net             -700 ml     Physical Exam: General:  Elderly sitting in chair NAD. No cough HEENT: normal anicteric Neck: supple. JVP 6-7  Carotids 2+ bilat; no bruits. No lymphadenopathy or thryomegaly appreciated. Cor: PMI nondisplaced. Regular rate & rhythm. No rubs, gallops or murmurs. Lungs: clear No wheezing Abdomen: soft, nontender, nondistended No hepatosplenomegaly. No bruits or masses. Good bowel sounds. Extremities: no cyanosis, clubbing, rash,  No edema. R knee sore but improving  Neuro: alert & orientedx3, cranial nerves grossly intact. moves all 4 extremities w/o difficulty. Affect pleasant  Telemetry: AF with v-pacing 60s   Labs: Basic Metabolic Panel:  Recent Labs Lab 06/17/16 1433 06/18/16 1121 06/18/16 1523 06/19/16 0407 06/20/16 0323 06/21/16 0416  NA 135 134*  --  135 132* 130*  K 4.2 3.8  --  4.3 3.6 4.3  CL 103 101  --  105 101 97*  CO2 24 19*  --  GLUCOSE 159* 181*  --  162* 120* 141*  BUN 15 17  --  22* 21* 17   CREATININE 0.62 0.88  --  0.98 0.87 0.80  CALCIUM 9.4 9.5  --  9.2 8.5* 8.6*  MG  --   --  1.8  --   --   --   PHOS  --   --  3.6  --   --   --     Liver Function Tests:  Recent Labs Lab 06/18/16 1121  AST 43*  ALT 18  ALKPHOS 104  BILITOT 1.3*  PROT 6.9  ALBUMIN 3.6   No results for input(s): LIPASE, AMYLASE in the last 168 hours. No results for input(s): AMMONIA in the last 168 hours.  CBC:  Recent Labs Lab 06/17/16 1433 06/18/16 1121 06/18/16 2016 06/19/16 0407 06/20/16 0323  WBC 9.0 12.2*  --  14.3* 11.7*  NEUTROABS 6.9 11.4*  --   --  9.5*  HGB 10.7* 10.9* 9.8* 9.1* 9.0*  HCT 34.1* 34.0* 31.5* 29.3* 28.3*  MCV 76.1* 77.8*  --  77.3* 77.1*  PLT 219 220  --  164 166    Cardiac Enzymes:  Recent Labs Lab 06/18/16 1523 06/18/16 1757 06/19/16 1449  TROPONINI 0.20*  0.21* 0.07*    BNP: BNP (last 3 results)  Recent Labs  03/11/16 1241  BNP 570.5*    ProBNP (last 3 results) No results for input(s): PROBNP in the last 8760 hours.    Other results:  Imaging: Dg Chest Port 1 View  Result Date: 06/21/2016 CLINICAL DATA:  Pneumonia EXAM: PORTABLE CHEST 1 VIEW COMPARISON:  06/19/2016 FINDINGS: Cardiac enlargement. Mild vascular congestion has progressed. Mild bilateral airspace disease also has progressed. Dual lead pacemaker unchanged. No effusion. IMPRESSION: Progression of vascular congestion and mild airspace disease. This may represent fluid overload versus pneumonia. Electronically Signed   By: Marlan Palau M.D.   On: 06/21/2016 07:26   Dg Knee Right Port  Result Date: 06/21/2016 CLINICAL DATA:  Effusion into joint Generalized pain X 4 days.   If EXAM: PORTABLE RIGHT KNEE - 1-2 VIEW COMPARISON:  06/17/2016 FINDINGS: Moderate effusion in the suprapatellar bursa. Possible free osteochondral body superolateral to the patella. Small marginal spurs from the patellar articular surface and tibial plateau. Chondrocalcinosis in medial and lateral  compartments. Negative for fracture or dislocation. Patchy femoral-popliteal arterial calcifications. IMPRESSION: 1. Tricompartmental degenerative change with moderate effusion, possible free osteochondral body. 2. Chondrocalcinosis suggesting CPPD. 3. Arterial atherosclerosis Electronically Signed   By: Corlis Leak M.D.   On: 06/21/2016 10:23     Medications:     Scheduled Medications: . aspirin  325 mg Oral Daily  . guaiFENesin  600 mg Oral BID  . latanoprost  1 drop Both Eyes QHS  . levothyroxine  88 mcg Oral QAC breakfast  . pantoprazole  40 mg Oral Daily  . piperacillin-tazobactam (ZOSYN)  IV  3.375 g Intravenous Q8H  . potassium chloride SA  20 mEq Oral Daily  . Rivaroxaban  20 mg Oral Q supper  . rosuvastatin  10 mg Oral Once per day on Mon Wed Fri  . sodium chloride flush  3 mL Intravenous Q12H  . timolol  1 drop Both Eyes q morning - 10a  . vancomycin  750 mg Intravenous Q12H    Infusions:   PRN Medications: acetaminophen **OR** acetaminophen, albuterol, calcium carbonate, hydrALAZINE, HYDROcodone-acetaminophen, ipratropium-albuterol, ondansetron **OR** ondansetron (ZOFRAN) IV, zolpidem   Assessment:   1. Acute on chronic diastolic CHF   --EF 60% echo 06/19/16 2. CAP 3. Acute respiratory failure 4. Permanent atrial fibrillation.  5. Nonobstructive CAD  6. Urinary tract infection 7. History of CHG s/p pacemaker  8. Elevated troponin 9. Lactic acidosis 19. Hypokalemia   Plan/Discussion:    Doing well. Volume status improved with IV lasix. Will switch to po.   Respiratory status improve. Lactic acidosis resolved. However CXR shows slight worsening of infiltrates. Continue abx per primary team. I reviewed use of incentive spirometer with her.    AF is chronic. Well rate controlled. Continue Xarelto   Discussed with family    Length of Stay: 3 Annelies Coyt MD 06/21/2016, 11:13 AM  Advanced Heart Failure Team Pager [phone] (M-F; 7a - 4p)  Please  contact [facility] for night-coverage after hours (4p -7a ) and weekends on [email or web address]

## 2016-06-21 NOTE — Progress Notes (Signed)
Pharmacy Antibiotic Note  Beth Santiago is a 83 y.o. female admitted on 06/18/2016 with sepsis secondary to pneumonia and UTI. Pharmacy has been consulted for vancomycin/zosyn dosing. Renal function has remained stable, blood cultures show no growth, urine culture is growing Enterococcus faecalis (susceptibilities pending).   Plan: -Continue Zosyn 3.375g IV q8h EI -Continue vancomycin  IV q12h -Monitor renal function, LOT, sensitivities closely - can likely narrow once Enterococcus culture susceptibilities return -Consider vancomycin level soon if continued    Height:  (167.6 cm) Weight: 174 lb 1.6 oz (79 kg) IBW/kg (Calculated) : 59.3  Temp (24hrs), Avg:98.4 F (36.9 C), Min:97.7 F (36.5 C), Max:99.2 F (37.3 C)   Recent Labs Lab 06/17/16 1433 06/18/16 1121 06/18/16 1144 06/18/16 1523 06/18/16 1757 06/19/16 0407 06/20/16 0323 06/21/16 0416  WBC 9.0 12.2*  --   --   --  14.3* 11.7*  --   CREATININE 0.62 0.88  --   --   --  0.98 0.87 0.80  LATICACIDVEN  --   --  5.07* 2.7* 4.0*  --   --  1.2    Estimated Creatinine Clearance: 50.6 mL/min (by C-G formula based on SCr of 0.8 mg/dL).    No Known Allergies  Antimicrobials: Vanco 3/29>> Zosyn 3/29>>  Dose Adjustments: None  Microbiology: 3/29 BCx: NGTD 3/29 UCx: >100k Enterococcus faecalis (susceptibilities pending) 3/29 MRSA PCR: negative  Fredonia Highland, PharmD PGY-1 Pharmacy Resident Pager: [phone] 06/21/2016

## 2016-06-21 NOTE — Progress Notes (Signed)
Patient ID: Beth Santiago, female   DOB: 12-30-1924, 83 y.o.   MRN: [id number]  PROGRESS NOTE    Beth Santiago  VOJ:[id number] DOB: Feb 04, 1925 DOA: 06/18/2016  PCP: Sheela Stack, MD   Brief Narrative:  83 y.o. female  significant for dementia, atrial fibrillation on xarelto, coronary disease, hypertension. Pt presented with right knee pain and swelling, weakness. Knee was tapped in ED. CXR showed right upper lobe airspace opacity, likely pneumonia. Her UA showed small leukocytes. She was started on broad spectrum abx while awaiting culture results.   Assessment & Plan:   Principal Problem:   Sepsis due to pneumonia and Enterococcus UTI ([place]) / Right upper lobe pneumonia / Leukocytosis  - Sepsis criteria met on admission - Source of infection pneumonia and UTI - Urine cx with more than 100,000 colonies, enterococcus faecalis  - Blood cx negative so far - Continue vanco and zosyn - Lactic acid WNL this am  Active Problems:   Moderate right knee effusion - Tapped in ED; cx negative so far, no crystals  - Spoke with ortho on call, dose not look septic and would just treat conservatively  - Continue vanco and zosyn  - Obtain x ray of the right knee this am    Troponin elevation - Likely demand ischemia from sepsis - Continue aspirin daily  - No chest pain    HLD (hyperlipidemia) - Continue Crestor    Hypothyroidism - Continue synthroid     Atrial fibrillation, chronic (HCC) - CHADS vasc score 4 - Continue xarelto for AC - Pt HR controlled without beta blockers     Chronic diastolic CHF (congestive heart failure) (HCC) - Given one dose of lasix 40 mg IV on 3/31 - Given 2 doses of lasix 3/30 40 mg IV  - Continue strict intake and output  - Continue daily weight  - Weight since 3/30: 81 kg --> 79 kg   DVT prophylaxis: On xarelto  Code Status: full code  Family Communication: daughter in law at bedside this am Disposition Plan: home or SNF once sepsis  etiology resolves    Consultants:   Cardio  Ortho   Procedures:   ECHO 3/30 - EF 55%  Antimicrobials:   Vanco and zosyn 06/18/2016 -->    Subjective: No overnight events.   Objective: Vitals:   06/20/16 1701 06/20/16 2100 06/21/16 0400 06/21/16 0823  BP: 139/77 121/74 135/68 123/61  Pulse:  66 69 60  Resp:  _0 Temp: 97.9 F (36.6 C) 97.7 F (36.5 C) 98.2 F (36.8 C) 98.8 F (37.1 C)  TempSrc: Oral Oral Oral Oral  SpO2: 100% 98% 97% 97%  Weight:   79 kg (174 lb 1.6 oz)   Height:        Intake/Output Summary (Last 24 hours) at 06/21/16 0826 Last data filed at 06/21/16 0400  Gross per 24 hour  Intake                0 ml  Output              700 ml  Net             -700 ml   Filed Weights   06/19/16 0412 06/20/16 0513 06/21/16 0400  Weight: 81 kg (178 lb 9.6 oz) 80.7 kg (178 lb) 79 kg (174 lb 1.6 oz)    Examination:  General exam: Appears in no distress  Respiratory system: diminished,no wheezing  Cardiovascular system: S1 & S2 heard, Rate controlled  Gastrointestinal system: (+) BS, non tender, non distended   Central nervous system: Nonfocal Extremities: right knee swollen, warmer to touch than left knee, no erythema Skin: warm and dry, no lesions or ulcers  Psychiatry: No agitation, normal mood   Data Reviewed: I have personally reviewed following labs and imaging studies  CBC:  Recent Labs Lab 06/17/16 1433 06/18/16 1121 06/18/16 2016 06/19/16 0407 06/20/16 0323  WBC 9.0 12.2*  --  14.3* 11.7*  NEUTROABS 6.9 11.4*  --   --  9.5*  HGB 10.7* 10.9* 9.8* 9.1* 9.0*  HCT 34.1* 34.0* 31.5* 29.3* 28.3*  MCV 76.1* 77.8*  --  77.3* 77.1*  PLT 219 220  --  164 672   Basic Metabolic Panel:  Recent Labs Lab 06/17/16 1433 06/18/16 1121 06/18/16 1523 06/19/16 0407 06/20/16 0323 06/21/16 0416  NA 135 134*  --  135 132* 130*  K 4.2 3.8  --  4.3 3.6 4.3  CL 103 101  --  105 101 97*  CO2 24 19*  --  _0 GLUCOSE 159* 181*  --  162*  120* 141*  BUN 15 17  --  22* 21* 17  CREATININE 0.62 0.88  --  0.98 0.87 0.80  CALCIUM 9.4 9.5  --  9.2 8.5* 8.6*  MG  --   --  1.8  --   --   --   PHOS  --   --  3.6  --   --   --    GFR: Estimated Creatinine Clearance: 50.6 mL/min (by C-G formula based on SCr of 0.8 mg/dL). Liver Function Tests:  Recent Labs Lab 06/18/16 1121  AST 43*  ALT 18  ALKPHOS 104  BILITOT 1.3*  PROT 6.9  ALBUMIN 3.6   No results for input(s): LIPASE, AMYLASE in the last 168 hours. No results for input(s): AMMONIA in the last 168 hours. Coagulation Profile: No results for input(s): INR, PROTIME in the last 168 hours. Cardiac Enzymes:  Recent Labs Lab 06/18/16 1523 06/18/16 1757 06/19/16 1449  TROPONINI 0.20* 0.21* 0.07*   BNP (last 3 results) No results for input(s): PROBNP in the last 8760 hours. HbA1C: No results for input(s): HGBA1C in the last 72 hours. CBG: No results for input(s): GLUCAP in the last 168 hours. Lipid Profile: No results for input(s): CHOL, HDL, LDLCALC, TRIG, CHOLHDL, LDLDIRECT in the last 72 hours. Thyroid Function Tests: No results for input(s): TSH, T4TOTAL, FREET4, T3FREE, THYROIDAB in the last 72 hours. Anemia Panel: No results for input(s): VITAMINB12, FOLATE, FERRITIN, TIBC, IRON, RETICCTPCT in the last 72 hours. Urine analysis:    Component Value Date/Time   COLORURINE AMBER (A) 06/18/2016 1238   APPEARANCEUR HAZY (A) 06/18/2016 1238   LABSPEC 1.023 06/18/2016 1238   PHURINE 5.0 06/18/2016 1238   GLUCOSEU >=500 (A) 06/18/2016 1238   HGBUR NEGATIVE 06/18/2016 1238   BILIRUBINUR NEGATIVE 06/18/2016 1238   KETONESUR NEGATIVE 06/18/2016 1238   PROTEINUR 30 (A) 06/18/2016 1238   UROBILINOGEN 0.2 12/09/2012 1401   NITRITE NEGATIVE 06/18/2016 1238   LEUKOCYTESUR SMALL (A) 06/18/2016 1238   Sepsis Labs: _1 (procalcitonin:4,lacticidven:4)    Body fluid culture     Status: None (Preliminary result)   Collection Time: 06/17/16  6:05 PM  Result  Value Ref Range Status   Specimen Description KNEE RIGHT  Final   Special Requests NONE  Final   Gram Stain   Final   Culture   Final    NO GROWTH 2 DAYS Performed  at [facility], [place] [address] [place], [place] [address]    Report Status PENDING  Incomplete  Blood Culture (routine x 2)     Status: None (Preliminary result)   Collection Time: 06/18/16 11:30 AM  Result Value Ref Range Status   Specimen Description BLOOD RIGHT HAND  Final   Special Requests BOTTLES DRAWN AEROBIC AND ANAEROBIC  5CC  Final   Culture NO GROWTH < 12 HOURS  Final   Report Status PENDING  Incomplete  Blood Culture (routine x 2)     Status: None (Preliminary result)   Collection Time: 06/18/16 11:35 AM  Result Value Ref Range Status   Specimen Description BLOOD RIGHT HAND  Final   Special Requests BOTTLES DRAWN AEROBIC AND ANAEROBIC  5CC  Final   Culture NO GROWTH < 12 HOURS  Final   Report Status PENDING  Incomplete  Culture, Urine     Status: None (Preliminary result)   Collection Time: 06/18/16 12:38 PM  Result Value Ref Range Status   Specimen Description URINE, RANDOM  Final   Special Requests NONE  Final   Culture CULTURE REINCUBATED FOR BETTER GROWTH  Final   Report Status PENDING  Incomplete  MRSA PCR Screening     Status: None   Collection Time: 06/18/16  4:54 PM  Result Value Ref Range Status   MRSA by PCR NEGATIVE NEGATIVE Final      Radiology Studies: Portable Chest 1 View Result Date: 06/19/2016 Significant improvement in infiltrates as well as vascular congestion when compared with the prior exam.   Dg Chest Portable 1 View Result Date: 06/18/2016 Right upper lobe airspace opacity, likely pneumonia. Lungs elsewhere clear. There is pulmonary vascular congestion which appear stable. Pacemaker leads unchanged. There is aortic atherosclerosis. Followup PA and lateral chest radiographs recommended in 3-4 weeks following trial of antibiotic therapy to ensure resolution and exclude  underlying malignancy.   Dg Knee Complete 4 Views Right Result Date: 06/17/2016 Moderate suprapatellar joint effusion. No other acute abnormality seen in the right knee. Electronically Signed   By: Marijo Conception, M.D.   On: 06/17/2016 15:14      Scheduled Meds: . aspirin  325 mg Oral Daily  . guaiFENesin  600 mg Oral BID  . ipratropium-albutero  3 mL Nebulization TID  . levothyroxine  88 mcg Oral QAC breakfast  . pantoprazole  40 mg Oral Daily  . piperacillin-tazobactam (ZOSYN)  IV  3.375 g Intravenous Q8H  . potassium chloride   20 mEq Oral Daily  . Rivaroxaban  20 mg Oral Q supper  . rosuvastatin  10 mg Oral  Mon Wed Fri  . vancomycin  750 mg Intravenous Q12H   Continuous Infusions:   LOS: 3 days    Time spent: 25 minutes  Greater than 50% of the time spent on counseling and coordinating the care.   Leisa Lenz, MD [facility] Pager [phone]  If 7PM-7AM, please contact night-coverage [email or web address] Password [facility] 06/21/2016, 8:26 AM

## 2016-06-22 LAB — BASIC METABOLIC PANEL WITH GFR
Anion gap: 7 (ref 5–15)
BUN: 9 mg/dL (ref 6–20)
CO2: 27 mmol/L (ref 22–32)
Calcium: 8.4 mg/dL — ABNORMAL LOW (ref 8.9–10.3)
Chloride: 98 mmol/L — ABNORMAL LOW (ref 101–111)
Creatinine, Ser: 0.66 mg/dL (ref 0.44–1.00)
GFR calc Af Amer: >60 mL/min
GFR calc non Af Amer: >60 mL/min
Glucose, Bld: 137 mg/dL — ABNORMAL HIGH (ref 65–99)
Potassium: 3.9 mmol/L (ref 3.5–5.1)
Sodium: 132 mmol/L — ABNORMAL LOW (ref 135–145)

## 2016-06-22 LAB — CBC
HCT: 28.5 % — ABNORMAL LOW (ref 36.0–46.0)
Hemoglobin: 9.1 g/dL — ABNORMAL LOW (ref 12.0–15.0)
MCH: 24.7 pg — ABNORMAL LOW (ref 26.0–34.0)
MCHC: 31.9 g/dL (ref 30.0–36.0)
MCV: 77.2 fL — ABNORMAL LOW (ref 78.0–100.0)
PLATELETS: 187 10*3/uL (ref 150–400)
RBC: 3.69 MIL/uL — AB (ref 3.87–5.11)
RDW: 19.9 % — ABNORMAL HIGH (ref 11.5–15.5)
WBC: 7.4 10*3/uL (ref 4.0–10.5)

## 2016-06-22 MED ORDER — POTASSIUM CHLORIDE CRYS ER 20 MEQ PO TBCR
20.0000 meq | EXTENDED_RELEASE_TABLET | Freq: Once | ORAL | Status: AC
  Administered 2016-06-22: 20 meq via ORAL
  Filled 2016-06-22: qty 1

## 2016-06-22 MED ORDER — DOCUSATE SODIUM 100 MG PO CAPS
100.0000 mg | ORAL_CAPSULE | Freq: Two times a day (BID) | ORAL | Status: DC
  Administered 2016-06-22 – 2016-06-24 (×4): 100 mg via ORAL
  Filled 2016-06-22 (×4): qty 1

## 2016-06-22 MED ORDER — FUROSEMIDE 10 MG/ML IJ SOLN
40.0000 mg | Freq: Once | INTRAMUSCULAR | Status: AC
  Administered 2016-06-22: 40 mg via INTRAVENOUS
  Filled 2016-06-22: qty 4

## 2016-06-22 MED ORDER — KETOROLAC TROMETHAMINE 15 MG/ML IJ SOLN
7.5000 mg | Freq: Four times a day (QID) | INTRAMUSCULAR | Status: DC
Start: 2016-06-22 — End: 2016-06-24
  Administered 2016-06-22 – 2016-06-24 (×9): 7.5 mg via INTRAVENOUS
  Filled 2016-06-22 (×10): qty 1

## 2016-06-22 NOTE — Progress Notes (Signed)
Advanced Heart Failure Rounding Note   Subjective:    CXR 06/21/16 slightly worsening fluid vs PNA  Breathing much better. Fatigued. R knee is primary complaint.   Renal function stable. K 3.9. Lactate 1.2 06/21/16.  Out 200 cc. Weight shows up 6 lbs. Doubt accuracy.   Objective:   Weight Range:  Vital Signs:   Temp:  [98 F (36.7 C)-99.2 F (37.3 C)] 98.1 F (36.7 C) (04/02 0002) Pulse Rate:  [61-66] 64 (04/02 0308) Resp:  [15-18] 16 (04/02 0308) BP: (106-140)/(61-92) 113/67 (04/02 0308) SpO2:  [97 %-100 %] 97 % (04/02 0308) Weight:  [180 lb 6.4 oz (81.8 kg)] 180 lb 6.4 oz (81.8 kg) (04/02 0304) Last BM Date: 06/17/16  Weight change: Filed Weights   06/20/16 0513 06/21/16 0400 06/22/16 0304  Weight: 178 lb (80.7 kg) 174 lb 1.6 oz (79 kg) 180 lb 6.4 oz (81.8 kg)    Intake/Output:   Intake/Output Summary (Last 24 hours) at 06/22/16 0845 Last data filed at 06/22/16 0530  Gross per 24 hour  Intake             2170 ml  Output             2500 ml  Net             -330 ml     Physical Exam: General:  Sitting up in bed eating breakfast, NAD.  HEENT: Normal Neck: Supple. JVP 9-10 cm with prominent CV wave.  Carotids 2+ bilat; no bruits. No thyromegaly or nodule noted.   Cor: PMI nondisplaced. RRR. No M/G/R noted.  Lungs: CTAB, normal effort.  Abdomen: Soft, NT, ND, no HSM. No bruits or masses. +BS.  Extremities. No cyanosis, clubbing, or rash. 1-2+ peripheral edema. R knee remains tender to the touch. Slightly warm.  Neuro: Alert & oriented x 3. Cranial nerves grossly intact. Moves all 4 extremities w/o difficulty. Affect pleasant    Telemetry: Reviewed personally, AF with V pacing in 60s.   Labs: Basic Metabolic Panel:  Recent Labs Lab 06/18/16 1121 06/18/16 1523 06/19/16 0407 06/20/16 0323 06/21/16 0416 06/22/16 0313  NA 134*  --  135 132* 130* 132*  K 3.8  --  4.3 3.6 4.3 3.9  CL 101  --  105 101 97* 98*  CO2 19*  --  GLUCOSE 181*  --   162* 120* 141* 137*  BUN 17  --  22* 21* 17 9  CREATININE 0.88  --  0.98 0.87 0.80 0.66  CALCIUM 9.5  --  9.2 8.5* 8.6* 8.4*  MG  --  1.8  --   --   --   --   PHOS  --  3.6  --   --   --   --    Liver Function Tests:  Recent Labs Lab 06/18/16 1121  AST 43*  ALT 18  ALKPHOS 104  BILITOT 1.3*  PROT 6.9  ALBUMIN 3.6   No results for input(s): LIPASE, AMYLASE in the last 168 hours. No results for input(s): AMMONIA in the last 168 hours.  CBC:  Recent Labs Lab 06/17/16 1433 06/18/16 1121 06/18/16 2016 06/19/16 0407 06/20/16 0323 06/22/16 0313  WBC 9.0 12.2*  --  14.3* 11.7* 7.4  NEUTROABS 6.9 11.4*  --   --  9.5*  --   HGB 10.7* 10.9* 9.8* 9.1* 9.0* 9.1*  HCT 34.1* 34.0* 31.5* 29.3* 28.3* 28.5*  MCV 76.1* 77.8*  --  77.3*  77.1* 77.2*  PLT 219 220  --  164 166 187    Cardiac Enzymes:  Recent Labs Lab 06/18/16 1523 06/18/16 1757 06/19/16 1449  TROPONINI 0.20* 0.21* 0.07*   BNP: BNP (last 3 results)  Recent Labs  03/11/16 1241  BNP 570.5*   ProBNP (last 3 results) No results for input(s): PROBNP in the last 8760 hours.  Other results:  Imaging: Dg Chest Port 1 View  Result Date: 06/21/2016 CLINICAL DATA:  Pneumonia EXAM: PORTABLE CHEST 1 VIEW COMPARISON:  06/19/2016 FINDINGS: Cardiac enlargement. Mild vascular congestion has progressed. Mild bilateral airspace disease also has progressed. Dual lead pacemaker unchanged. No effusion. IMPRESSION: Progression of vascular congestion and mild airspace disease. This may represent fluid overload versus pneumonia. Electronically Signed   By: Marlan Palau M.D.   On: 06/21/2016 07:26   Dg Knee Right Port  Result Date: 06/21/2016 CLINICAL DATA:  Effusion into joint Generalized pain X 4 days.   If EXAM: PORTABLE RIGHT KNEE - 1-2 VIEW COMPARISON:  06/17/2016 FINDINGS: Moderate effusion in the suprapatellar bursa. Possible free osteochondral body superolateral to the patella. Small marginal spurs from the patellar  articular surface and tibial plateau. Chondrocalcinosis in medial and lateral compartments. Negative for fracture or dislocation. Patchy femoral-popliteal arterial calcifications. IMPRESSION: 1. Tricompartmental degenerative change with moderate effusion, possible free osteochondral body. 2. Chondrocalcinosis suggesting CPPD. 3. Arterial atherosclerosis Electronically Signed   By: Corlis Leak M.D.   On: 06/21/2016 10:23     Medications:     Scheduled Medications: . aspirin  325 mg Oral Daily  . furosemide  40 mg Oral Daily  . guaiFENesin  600 mg Oral BID  . latanoprost  1 drop Both Eyes QHS  . levothyroxine  88 mcg Oral QAC breakfast  . pantoprazole  40 mg Oral Daily  . piperacillin-tazobactam (ZOSYN)  IV  3.375 g Intravenous Q8H  . potassium chloride SA  20 mEq Oral Daily  . Rivaroxaban  20 mg Oral Q supper  . rosuvastatin  10 mg Oral Once per day on Mon Wed Fri  . sodium chloride flush  3 mL Intravenous Q12H  . timolol  1 drop Both Eyes q morning - 10a  . vancomycin  750 mg Intravenous Q12H    Infusions:   PRN Medications: acetaminophen **OR** acetaminophen, albuterol, calcium carbonate, hydrALAZINE, HYDROcodone-acetaminophen, ipratropium-albuterol, ondansetron **OR** ondansetron (ZOFRAN) IV, zolpidem   Assessment:   1. Acute on chronic diastolic CHF   --EF 60% echo 06/19/16 2. CAP 3. Acute respiratory failure 4. Permanent atrial fibrillation.  5. Nonobstructive CAD  6. Urinary tract infection 7. History of CHG s/p pacemaker  8. Elevated troponin 9. Lactic acidosis 10. Hypokalemia   Plan/Discussion:    Volume status somewhat elevated again. Will give 40 mg IV lasix. ? accuracy of weights. K stable.   Respiratory status improving. Remains on Zosyn per primary. Continue incentive spirometry.   AF is chronic and rate controlled. No bleeding on Xarelto. No change.    Length of Stay: 4 Luane School  06/22/2016, 8:45 AM  Advanced Heart Failure  Team Pager [phone] (M-F; 7a - 4p)  Please contact [facility] for night-coverage after hours (4p -7a ) and weekends on [email or web address]  Patient seen and examined with the above-signed Advanced Practice Provider and/or Housestaff. I personally reviewed laboratory data, imaging studies and relevant notes. I independently examined the patient and formulated the important aspects of the plan. I have edited the note to reflect any of my changes or salient  points. I have personally discussed the plan with the patient and/or family.  Improving slowly. Neck veins look up again. Will give IV lasix again today. Renal function stable.   AF rate controlled. Continue Xarelto.   Abx per primary.   Arvilla Meres, MD  10:12 PM

## 2016-06-22 NOTE — Clinical Social Work Note (Signed)
Clinical Social Work Assessment  Patient Details  Name: Beth Santiago MRN: [id number] Date of Birth: Jul 09, 1924  Date of referral:  06/22/16               Reason for consult:  Facility Placement                Permission sought to share information with:    Permission granted to share information::  Yes, Verbal Permission Granted  Name::     Nature conservation officer::  SNF  Relationship::  dtr  Contact Information:     Housing/Transportation Living arrangements for the past 2 months:  [place] of Information:  Patient, Adult Children Patient Interpreter Needed:  None Criminal Activity/Legal Involvement Pertinent to Current Situation/Hospitalization:  No - Comment as needed Significant Relationships:  Adult Children Lives with:  Adult Children (dtr Beth Santiago and son-in-law) Do you feel safe going back to the place where you live?  No Need for family participation in patient care:  Yes (Comment) (help with care at home)  Care giving concerns:  Pt lives at home with dtr and son in law- would need to be more independent in transfers and mobility to return home safetly- dtr Beth Santiago is at home during the day but can't offer much physical assist and son-in-law works so is not available 24/7.   Social Worker assessment / plan:  CSW met with pt and pt dtr Beth Santiago concerning PT recommendation for SNF.  CSW explained SNF and SNF referral process to patient and dtr.  Explained general Medicare coverage for pt dtr.  Employment status:  Retired Nurse, adult PT Recommendations:  [place] / Referral to community resources:  [place]  Patient/Family's Response to care:  Pt somewhat hesitant about SNF and would prefer to go home but is willing to consider SNF especially if it is close to her family in [place].  Patient/Family's Understanding of and Emotional Response to Diagnosis, Current Treatment, and  Prognosis:  Unclear how much pt understands about her current care needs but she is willing to listen to reason from family- both pt and family hopeful for quick recovery at SNF.  Emotional Assessment Appearance:  Appears stated age Attitude/Demeanor/Rapport:    Affect (typically observed):  Appropriate, Pleasant Orientation:  Oriented to Self, Oriented to Place, Oriented to  Time, Oriented to Situation Alcohol / Substance use:  Not Applicable Psych involvement (Current and /or in the community):  No (Comment)  Discharge Needs  Concerns to be addressed:  Care Coordination Readmission within the last 30 days:  No Current discharge risk:  Physical Impairment Barriers to Discharge:  Continued Medical Work up   Jorge Ny, LCSW 06/22/2016, 2:35 PM

## 2016-06-22 NOTE — NC FL2 (Signed)
[place] MEDICAID FL2 LEVEL OF CARE SCREENING TOOL     IDENTIFICATION  Patient Name: Beth Santiago Birthdate: 1924/06/09 Sex: female Admission Date (Current Location): 06/18/2016  [facility] and [place] Number:  Producer, television/film/video and Address:  [facility], [address], [place], [place] [address]      Provider Number: [id number]  Attending Physician Name and Address:  Alison Murray, MD  Relative Name and Phone Number:       Current Level of Care: Hospital Recommended Level of Care: Skilled Nursing Facility Prior Approval Number:    Date Approved/Denied:   PASRR Number: [id number]  Discharge Plan: SNF    Current Diagnoses: Patient Active Problem List   Diagnosis Date Noted  . Other specified hypothyroidism   . Community acquired pneumonia of right upper lobe of lung (HCC)   . Sepsis due to pneumonia (HCC) 06/18/2016  . Acute on chronic diastolic congestive heart failure (HCC) 03/11/2016  . Snoring 03/11/2016  . Dizziness 03/26/2014  . Fatigue 12/09/2012  . Precordial chest pain 12/09/2012  . Dyspnea 06/11/2011  . Atrial fibrillation (HCC) 06/11/2011  . HLD (hyperlipidemia) 12/11/2009  . HYPERTENSION, BENIGN 12/11/2009  . CAD, NATIVE VESSEL 12/11/2009  . AV BLOCK, COMPLETE 12/11/2009  . PACEMAKER-[employer] 12/11/2009    Orientation RESPIRATION BLADDER Height & Weight     Self, Time, Situation, Place  Normal Indwelling catheter Weight: 180 lb 6.4 oz (81.8 kg) Height:   (167.6 cm)  BEHAVIORAL SYMPTOMS/MOOD NEUROLOGICAL BOWEL NUTRITION STATUS      Continent Diet (cardiac)  AMBULATORY STATUS COMMUNICATION OF NEEDS Skin   Limited Assist Verbally Normal                       Personal Care Assistance Level of Assistance  Bathing, Dressing Bathing Assistance: Limited assistance   Dressing Assistance: Limited assistance     Functional Limitations Info             SPECIAL CARE FACTORS FREQUENCY  PT (By licensed  PT), OT (By licensed OT)     PT Frequency: 5/wk OT Frequency: 5/wk            Contractures      Additional Factors Info  Code Status, Allergies Code Status Info: FULL Allergies Info: NKA           Current Medications (06/22/2016):  This is the current hospital active medication list Current Facility-Administered Medications  Medication Dose Route Frequency Provider Last Rate Last Dose  . acetaminophen (TYLENOL) tablet 650 mg  650 mg Oral Q6H PRN Haydee Salter, MD       Or  . acetaminophen (TYLENOL) suppository 650 mg  650 mg Rectal Q6H PRN Haydee Salter, MD      . albuterol (PROVENTIL) (2.5 MG/3ML) 0.083% nebulizer solution 2.5 mg  2.5 mg Nebulization Q2H PRN Haydee Salter, MD   2.5 mg at 06/21/16 2055  . aspirin tablet 325 mg  325 mg Oral Daily Haydee Salter, MD   325 mg at 06/22/16 0905  . calcium carbonate (TUMS - dosed in mg elemental calcium) chewable tablet 200 mg of elemental calcium  1 tablet Oral PRN Eston Esters, MD   200 mg of elemental calcium at 06/19/16 1716  . furosemide (LASIX) injection 40 mg  40 mg Intravenous Once Mariam Dollar Tillery, PA-C      . guaiFENesin [facility]) 12 hr tablet 600 mg  600 mg Oral BID Haydee Salter,  MD   600 mg at 06/22/16 0905  . hydrALAZINE (APRESOLINE) injection 10 mg  10 mg Intravenous Q8H PRN Haydee Salter, MD      . HYDROcodone-acetaminophen (NORCO/VICODIN) 5-325 MG per tablet 1-2 tablet  1-2 tablet Oral Q6H PRN Briscoe Deutscher, MD   2 tablet at 06/22/16 0905  . ipratropium-albuterol (DUONEB) 0.5-2.5 (3) MG/3ML nebulizer solution 3 mL  3 mL Nebulization Q4H PRN Alison Murray, MD   3 mL at 06/22/16 1320  . ketorolac (TORADOL) 15 MG/ML injection 7.5 mg  7.5 mg Intravenous Q6H Alison Murray, MD   7.5 mg at 06/22/16 1207  . latanoprost (XALATAN) 0.005 % ophthalmic solution 1 drop  1 drop Both Eyes QHS Haydee Salter, MD   1 drop at 06/21/16 2126  . levothyroxine (SYNTHROID, LEVOTHROID) tablet 88 mcg  88 mcg Oral QAC breakfast  Haydee Salter, MD   88 mcg at 06/22/16 0747  . ondansetron (ZOFRAN) tablet 4 mg  4 mg Oral Q6H PRN Haydee Salter, MD       Or  . ondansetron [facility]) injection 4 mg  4 mg Intravenous Q6H PRN Haydee Salter, MD      . pantoprazole (PROTONIX) EC tablet 40 mg  40 mg Oral Daily Haydee Salter, MD   40 mg at 06/22/16 0747  . piperacillin-tazobactam (ZOSYN) IVPB 3.375 g  3.375 g Intravenous Q8H Almon Hercules, RPH   3.375 g at 06/22/16 1206  . potassium chloride SA (K-DUR,KLOR-CON) CR tablet 20 mEq  20 mEq Oral Daily Haydee Salter, MD   20 mEq at 06/22/16 0905  . potassium chloride SA (K-DUR,KLOR-CON) CR tablet 20 mEq  20 mEq Oral Once Mariam Dollar Tillery, PA-C      . rivaroxaban Carlena Hurl) tablet 20 mg  20 mg Oral Q supper Lynita Lombard Farley, RPH   20 mg at 06/21/16 1730  . rosuvastatin (CRESTOR) tablet 10 mg  10 mg Oral Once per day on Mon Wed Fri Alison Murray, MD   10 mg at 06/19/16 2048  . sodium chloride flush (NS) 0.9 % injection 3 mL  3 mL Intravenous Q12H Haydee Salter, MD   3 mL at 06/21/16 2126  . timolol (TIMOPTIC) 0.5 % ophthalmic solution 1 drop  1 drop Both Eyes q morning - 10a Haydee Salter, MD   1 drop at 06/22/16 0906  . vancomycin (VANCOCIN) IVPB 750 mg/150 ml premix  750 mg Intravenous Q12H Almon Hercules, RPH   750 mg at 06/22/16 1206  . zolpidem (AMBIEN) tablet 5 mg  5 mg Oral QHS PRN Briscoe Deutscher, MD         Discharge Medications: Please see discharge summary for a list of discharge medications.  Relevant Imaging Results:  Relevant Lab Results:   Additional Information SS#: [id number]  Burna Sis, LCSW

## 2016-06-22 NOTE — Care Management Important Message (Signed)
Important Message  Patient Details  Name: Beth Santiago MRN: [id number] Date of Birth: [birth year]   Medicare Important Message Given:  Yes    Seirra Kos Stefan Church 06/22/2016, 4:17 PM

## 2016-06-22 NOTE — Progress Notes (Signed)
Patient ID: Beth Santiago, female   DOB: [birth year], 83 y.o.   MRN: [id number]  PROGRESS NOTE    Beth Santiago  GEZ:[id number] DOB: 07/11/1924 DOA: 06/18/2016  PCP: Sheela Stack, MD   Brief Narrative:  83 y.o. female  significant for dementia, atrial fibrillation on xarelto, coronary disease, hypertension. Pt presented with right knee pain and swelling, weakness. Knee was tapped in ED. CXR showed right upper lobe airspace opacity, likely pneumonia. Her UA showed small leukocytes. She was started on broad spectrum abx while awaiting culture results.   Assessment & Plan:   Principal Problem:   Sepsis due to pneumonia and Enterococcus UTI ([place]) / Right upper lobe pneumonia / Leukocytosis  - Sepsis criteria met on admission - Source of infection pneumonia and UTI - Urine cx with more than 100,000 colonies, enterococcus faecalis  - Blood cx negative so far - Continue vanco and zosyn - Lactic acid WNL  Active Problems:   Moderate right knee effusion - Tapped in ED; cx negative so far, no crystals  - Spoke with ortho on call 3/30 Dr. Edmonia Lynch, dose not look septic and would just treat conservatively  - Called Dr. Alvan Dame this am, said to apply ace warp and ice and use Toradol 7.5 mg IV for inflammation; would not tap at this time due to pneumonia (acute infection), pt to follow upon discharge in their office  - Continue vanco and zosyn  - X ray of the knee 4/1 showed possible pseudogout     Troponin elevation - Likely demand ischemia from sepsis - Continue aspirin daily  - No chest pain    HLD (hyperlipidemia) - Continue Crestor    Hypothyroidism - Continue synthroid     Atrial fibrillation, chronic (HCC) - CHADS vasc score 4 - Continue xarelto for [facility] - Pt HR controlled without beta blockers     Chronic diastolic CHF (congestive heart failure) ([place]) - Given one dose of lasix 40 mg IV on 3/31 - Continue lasix per cardio recommendations  - Continue strict  intake and output  - Continue daily weight and strict intake and output    DVT prophylaxis: On xarelto  Code Status: full code  Family Communication: daughter in law at bedside this am Disposition Plan: home or SNF once sepsis etiology resolves; possibly by Wednesday    Consultants:   Cardio  Ortho   Procedures:   ECHO 3/30 - EF 55%  Antimicrobials:   Vanco and zosyn 06/18/2016 -->    Subjective: No overnight events.   Objective: Vitals:   06/21/16 2057 06/22/16 0002 06/22/16 0304 06/22/16 0308  BP:  114/61  113/67  Pulse: 62 61  64  Resp: 18 18  16   Temp:  98.1 F (36.7 C)    TempSrc:      SpO2: 100% 100%  97%  Weight:   81.8 kg (180 lb 6.4 oz)   Height:        Intake/Output Summary (Last 24 hours) at 06/22/16 1214 Last data filed at 06/22/16 0530  Gross per 24 hour  Intake             2170 ml  Output             2500 ml  Net             -330 ml   Filed Weights   06/20/16 0513 06/21/16 0400 06/22/16 0304  Weight: 80.7 kg (178 lb) 79 kg (174 lb 1.6 oz) 81.8 kg (180 lb 6.4  oz)    Examination:  General exam: Appears in no distress, calm and comfortable  Respiratory system: wheezing in upper lung lobes, no crackles  Cardiovascular system: S1 & S2 heard, Rate controlled  Gastrointestinal system: (+) BS, no distention  Central nervous system: Nonfocal Extremities: right knee swollen but not so tender to palpation  Skin: no lesions or ulcers  Psychiatry: normal mood and behavior   Data Reviewed: I have personally reviewed following labs and imaging studies  CBC:  Recent Labs Lab 06/17/16 1433 06/18/16 1121 06/18/16 2016 06/19/16 0407 06/20/16 0323 06/22/16 0313  WBC 9.0 12.2*  --  14.3* 11.7* 7.4  NEUTROABS 6.9 11.4*  --   --  9.5*  --   HGB 10.7* 10.9* 9.8* 9.1* 9.0* 9.1*  HCT 34.1* 34.0* 31.5* 29.3* 28.3* 28.5*  MCV 76.1* 77.8*  --  77.3* 77.1* 77.2*  PLT 219 220  --  164 166 324   Basic Metabolic Panel:  Recent Labs Lab 06/18/16 1121  06/18/16 1523 06/19/16 0407 06/20/16 0323 06/21/16 0416 06/22/16 0313  NA 134*  --  135 132* 130* 132*  K 3.8  --  4.3 3.6 4.3 3.9  CL 101  --  105 101 97* 98*  CO2 19*  --  22 23 24 27   GLUCOSE 181*  --  162* 120* 141* 137*  BUN 17  --  22* 21* 17 9  CREATININE 0.88  --  0.98 0.87 0.80 0.66  CALCIUM 9.5  --  9.2 8.5* 8.6* 8.4*  MG  --  1.8  --   --   --   --   PHOS  --  3.6  --   --   --   --    GFR: Estimated Creatinine Clearance: 51.4 mL/min (by C-G formula based on SCr of 0.66 mg/dL). Liver Function Tests:  Recent Labs Lab 06/18/16 1121  AST 43*  ALT 18  ALKPHOS 104  BILITOT 1.3*  PROT 6.9  ALBUMIN 3.6   No results for input(s): LIPASE, AMYLASE in the last 168 hours. No results for input(s): AMMONIA in the last 168 hours. Coagulation Profile: No results for input(s): INR, PROTIME in the last 168 hours. Cardiac Enzymes:  Recent Labs Lab 06/18/16 1523 06/18/16 1757 06/19/16 1449  TROPONINI 0.20* 0.21* 0.07*   BNP (last 3 results) No results for input(s): PROBNP in the last 8760 hours. HbA1C: No results for input(s): HGBA1C in the last 72 hours. CBG: No results for input(s): GLUCAP in the last 168 hours. Lipid Profile: No results for input(s): CHOL, HDL, LDLCALC, TRIG, CHOLHDL, LDLDIRECT in the last 72 hours. Thyroid Function Tests: No results for input(s): TSH, T4TOTAL, FREET4, T3FREE, THYROIDAB in the last 72 hours. Anemia Panel: No results for input(s): VITAMINB12, FOLATE, FERRITIN, TIBC, IRON, RETICCTPCT in the last 72 hours. Urine analysis:    Component Value Date/Time   COLORURINE AMBER (A) 06/18/2016 1238   APPEARANCEUR HAZY (A) 06/18/2016 1238   LABSPEC 1.023 06/18/2016 1238   PHURINE 5.0 06/18/2016 1238   GLUCOSEU >=500 (A) 06/18/2016 1238   HGBUR NEGATIVE 06/18/2016 1238   BILIRUBINUR NEGATIVE 06/18/2016 1238   KETONESUR NEGATIVE 06/18/2016 1238   PROTEINUR 30 (A) 06/18/2016 1238   UROBILINOGEN 0.2 12/09/2012 1401   NITRITE NEGATIVE  06/18/2016 1238   LEUKOCYTESUR SMALL (A) 06/18/2016 1238   Sepsis Labs: @LABRCNTIP (procalcitonin:4,lacticidven:4)    Body fluid culture     Status: None (Preliminary result)   Collection Time: 06/17/16  6:05 PM  Result Value Ref  Range Status   Specimen Description KNEE RIGHT  Final   Special Requests NONE  Final   Gram Stain   Final   Culture   Final    NO GROWTH 2 DAYS Performed at [facility], [address] [place], [place] [address]    Report Status PENDING  Incomplete  Blood Culture (routine x 2)     Status: None (Preliminary result)   Collection Time: 06/18/16 11:30 AM  Result Value Ref Range Status   Specimen Description BLOOD RIGHT HAND  Final   Special Requests BOTTLES DRAWN AEROBIC AND ANAEROBIC  5CC  Final   Culture NO GROWTH < 12 HOURS  Final   Report Status PENDING  Incomplete  Blood Culture (routine x 2)     Status: None (Preliminary result)   Collection Time: 06/18/16 11:35 AM  Result Value Ref Range Status   Specimen Description BLOOD RIGHT HAND  Final   Special Requests BOTTLES DRAWN AEROBIC AND ANAEROBIC  5CC  Final   Culture NO GROWTH < 12 HOURS  Final   Report Status PENDING  Incomplete  Culture, Urine     Status: None (Preliminary result)   Collection Time: 06/18/16 12:38 PM  Result Value Ref Range Status   Specimen Description URINE, RANDOM  Final   Special Requests NONE  Final   Culture CULTURE REINCUBATED FOR BETTER GROWTH  Final   Report Status PENDING  Incomplete  MRSA PCR Screening     Status: None   Collection Time: 06/18/16  4:54 PM  Result Value Ref Range Status   MRSA by PCR NEGATIVE NEGATIVE Final      Radiology Studies: Portable Chest 1 View Result Date: 06/19/2016 Significant improvement in infiltrates as well as vascular congestion when compared with the prior exam.   Dg Chest Portable 1 View Result Date: 06/18/2016 Right upper lobe airspace opacity, likely pneumonia. Lungs elsewhere clear. There is pulmonary vascular  congestion which appear stable. Pacemaker leads unchanged. There is aortic atherosclerosis. Followup PA and lateral chest radiographs recommended in 3-4 weeks following trial of antibiotic therapy to ensure resolution and exclude underlying malignancy.   Dg Knee Complete 4 Views Right Result Date: 06/17/2016 Moderate suprapatellar joint effusion. No other acute abnormality seen in the right knee. Electronically Signed   By: Marijo Conception, M.D.   On: 06/17/2016 15:14      Scheduled Meds: . aspirin  325 mg Oral Daily  . guaiFENesin  600 mg Oral BID  . ipratropium-albutero  3 mL Nebulization TID  . levothyroxine  88 mcg Oral QAC breakfast  . pantoprazole  40 mg Oral Daily  . piperacillin-tazobactam (ZOSYN)  IV  3.375 g Intravenous Q8H  . potassium chloride   20 mEq Oral Daily  . Rivaroxaban  20 mg Oral Q supper  . rosuvastatin  10 mg Oral  Mon Wed Fri  . vancomycin  750 mg Intravenous Q12H   Continuous Infusions:   LOS: 4 days    Time spent: 25 minutes  Greater than 50% of the time spent on counseling and coordinating the care.   Leisa Lenz, MD [facility] Pager [phone]  If 7PM-7AM, please contact night-coverage [email or web address] Password [email or web address] 06/22/2016, 12:14 PM

## 2016-06-23 LAB — CBC
HCT: 29.5 % — ABNORMAL LOW (ref 36.0–46.0)
Hemoglobin: 9.4 g/dL — ABNORMAL LOW (ref 12.0–15.0)
MCH: 24.7 pg — AB (ref 26.0–34.0)
MCHC: 31.9 g/dL (ref 30.0–36.0)
MCV: 77.4 fL — AB (ref 78.0–100.0)
PLATELETS: 229 10*3/uL (ref 150–400)
RBC: 3.81 MIL/uL — ABNORMAL LOW (ref 3.87–5.11)
RDW: 19.5 % — AB (ref 11.5–15.5)
WBC: 8.7 10*3/uL (ref 4.0–10.5)

## 2016-06-23 LAB — CULTURE, BLOOD (ROUTINE X 2)
Culture: NO GROWTH
Culture: NO GROWTH

## 2016-06-23 LAB — BASIC METABOLIC PANEL
Anion gap: 10 (ref 5–15)
BUN: 12 mg/dL (ref 6–20)
CALCIUM: 8.7 mg/dL — AB (ref 8.9–10.3)
CO2: 26 mmol/L (ref 22–32)
Chloride: 97 mmol/L — ABNORMAL LOW (ref 101–111)
Creatinine, Ser: 0.88 mg/dL (ref 0.44–1.00)
GFR calc Af Amer: >60 mL/min (ref >60)
GFR, EST NON AFRICAN AMERICAN: 57 mL/min — AB (ref >60)
Glucose, Bld: 114 mg/dL — ABNORMAL HIGH (ref 65–99)
POTASSIUM: 4.3 mmol/L (ref 3.5–5.1)
Sodium: 133 mmol/L — ABNORMAL LOW (ref 135–145)

## 2016-06-23 LAB — MAGNESIUM: Magnesium: 1.8 mg/dL (ref 1.7–2.4)

## 2016-06-23 LAB — VANCOMYCIN, TROUGH: Vancomycin Tr: 20 ug/mL (ref 15–20)

## 2016-06-23 MED ORDER — VANCOMYCIN HCL 500 MG IV SOLR: 500.0000 mg | Freq: Two times a day (BID) | INTRAVENOUS | Status: DC

## 2016-06-23 MED ORDER — ACETAMINOPHEN 325 MG PO TABS: 650.0000 mg | ORAL_TABLET | Freq: Four times a day (QID) | ORAL | 0 refills | Status: DC | PRN

## 2016-06-23 MED ORDER — TRAMADOL HCL 50 MG PO TABS: 50.0000 mg | ORAL_TABLET | Freq: Four times a day (QID) | ORAL | 0 refills | Status: DC | PRN

## 2016-06-23 MED ORDER — LEVOFLOXACIN 750 MG PO TABS
750.0000 mg | ORAL_TABLET | ORAL | Status: DC
  Administered 2016-06-23: 750 mg via ORAL
  Filled 2016-06-23: qty 1

## 2016-06-23 MED ORDER — HYDROCODONE-ACETAMINOPHEN 5-325 MG PO TABS: 1.0000 | ORAL_TABLET | Freq: Four times a day (QID) | ORAL | 0 refills | Status: DC | PRN

## 2016-06-23 NOTE — Progress Notes (Signed)
Advanced Heart Failure Rounding Note   Subjective:    CXR 06/21/16 slightly worsening fluid vs PNA  Continues to feel better. Feet still slightly swollen. Needs SNF per PT. Needs assistance to transfer from chair to toilet, etc. Denies SOB currently. No CP, lightheadedness, or dizziness.   Creatinine 0.6 -> 0.8. K 4.3.   Out 200 cc with 40 mg IV lasix yesterday. Weight shows down 1 lb.    Objective:   Weight Range:  Vital Signs:   Temp:  [97.5 F (36.4 C)-98.8 F (37.1 C)] 97.6 F (36.4 C) (04/03 0615) Pulse Rate:  [61-65] 62 (04/03 0615) Resp:  [13-18] 13 (04/03 0615) BP: (110-149)/(66-85) 141/85 (04/03 0615) SpO2:  [98 %-100 %] 100 % (04/03 0615) Weight:  [179 lb 9.6 oz (81.5 kg)] 179 lb 9.6 oz (81.5 kg) (04/03 0459) Last BM Date:  (per pt is has been a while)  Weight change: Filed Weights   06/21/16 0400 06/22/16 0304 06/23/16 0459  Weight: 174 lb 1.6 oz (79 kg) 180 lb 6.4 oz (81.8 kg) 179 lb 9.6 oz (81.5 kg)    Intake/Output:   Intake/Output Summary (Last 24 hours) at 06/23/16 0745 Last data filed at 06/23/16 0017  Gross per 24 hour  Intake              400 ml  Output              600 ml  Net             -200 ml     Physical Exam: General:  Elderly. Seated on bedside commode.  NAD.  HEENT: Normal Neck: Supple. JVP 8-9 with prominent CV wave. Carotids 2+ bilat; no bruits. No thyromegaly or nodule noted.   Cor: PMI non-displaced. RRR. No M/G/R noted.   Lungs: Minimally diminished basilar sounds.  Abdomen: soft, NT, ND, no HSM. No bruits or masses. +BS   Extremities. No cyanosis, clubbing, or rash. 1+ peripheral edema. R knee tender and slightly warm to the touch. Neuro: Alert & oriented x 3. Cranial nerves grossly intact. Moves all 4 extremities w/o difficulty. Affect pleasant    Telemetry: Personally reviewed, AF with V pacing in 60s.    Labs: Basic Metabolic Panel:  Recent Labs Lab 06/18/16 1523 06/19/16 0407 06/20/16 0323 06/21/16 0416  06/22/16 0313 06/23/16 0415  NA  --  135 132* 130* 132* 133*  K  --  4.3 3.6 4.3 3.9 4.3  CL  --  105 101 97* 98* 97*  CO2  --  GLUCOSE  --  162* 120* 141* 137* 114*  BUN  --  22* 21* CREATININE  --  0.98 0.87 0.80 0.66 0.88  CALCIUM  --  9.2 8.5* 8.6* 8.4* 8.7*  MG 1.8  --   --   --   --   --   PHOS 3.6  --   --   --   --   --    Liver Function Tests:  Recent Labs Lab 06/18/16 1121  AST 43*  ALT 18  ALKPHOS 104  BILITOT 1.3*  PROT 6.9  ALBUMIN 3.6   No results for input(s): LIPASE, AMYLASE in the last 168 hours. No results for input(s): AMMONIA in the last 168 hours.  CBC:  Recent Labs Lab 06/17/16 1433 06/18/16 1121 06/18/16 2016 06/19/16 0407 06/20/16 0323 06/22/16 0313 06/23/16 0415  WBC 9.0 12.2*  --  14.3* 11.7* 7.4  8.7  NEUTROABS 6.9 11.4*  --   --  9.5*  --   --   HGB 10.7* 10.9* 9.8* 9.1* 9.0* 9.1* 9.4*  HCT 34.1* 34.0* 31.5* 29.3* 28.3* 28.5* 29.5*  MCV 76.1* 77.8*  --  77.3* 77.1* 77.2* 77.4*  PLT 219 220  --  164 166 187 229    Cardiac Enzymes:  Recent Labs Lab 06/18/16 1523 06/18/16 1757 06/19/16 1449  TROPONINI 0.20* 0.21* 0.07*   BNP: BNP (last 3 results)  Recent Labs  03/11/16 1241  BNP 570.5*   ProBNP (last 3 results) No results for input(s): PROBNP in the last 8760 hours.  Other results:  Imaging: Dg Knee Right Port  Result Date: 06/21/2016 CLINICAL DATA:  Effusion into joint Generalized pain X 4 days.   If EXAM: PORTABLE RIGHT KNEE - 1-2 VIEW COMPARISON:  06/17/2016 FINDINGS: Moderate effusion in the suprapatellar bursa. Possible free osteochondral body superolateral to the patella. Small marginal spurs from the patellar articular surface and tibial plateau. Chondrocalcinosis in medial and lateral compartments. Negative for fracture or dislocation. Patchy femoral-popliteal arterial calcifications. IMPRESSION: 1. Tricompartmental degenerative change with moderate effusion, possible free osteochondral  body. 2. Chondrocalcinosis suggesting CPPD. 3. Arterial atherosclerosis Electronically Signed   By: Corlis Leak M.D.   On: 06/21/2016 10:23     Medications:     Scheduled Medications: . aspirin  325 mg Oral Daily  . docusate sodium  100 mg Oral BID  . guaiFENesin  600 mg Oral BID  . ketorolac  7.5 mg Intravenous Q6H  . latanoprost  1 drop Both Eyes QHS  . levothyroxine  88 mcg Oral QAC breakfast  . pantoprazole  40 mg Oral Daily  . piperacillin-tazobactam (ZOSYN)  IV  3.375 g Intravenous Q8H  . potassium chloride SA  20 mEq Oral Daily  . Rivaroxaban  20 mg Oral Q supper  . rosuvastatin  10 mg Oral Once per day on Mon Wed Fri  . sodium chloride flush  3 mL Intravenous Q12H  . timolol  1 drop Both Eyes q morning - 10a  . vancomycin  750 mg Intravenous Q12H    Infusions:   PRN Medications: acetaminophen **OR** acetaminophen, albuterol, calcium carbonate, hydrALAZINE, HYDROcodone-acetaminophen, ipratropium-albuterol, ondansetron **OR** ondansetron (ZOFRAN) IV, zolpidem   Assessment:   1. Acute on chronic diastolic CHF   --EF 60% echo 06/19/16 2. CAP 3. Acute respiratory failure 4. Permanent atrial fibrillation.  5. Nonobstructive CAD  6. Urinary tract infection 7. History of CHG s/p pacemaker  8. Elevated troponin 9. Lactic acidosis 10. Hypokalemia  Plan/Discussion:    Volume status remains mildly elevated, though suspect some of this is her TR which was severe by Echo. With 1+ edema, will give one additional dose of 40 mg IV lasix this am.  Likely to po diuretics tonight vs tomorrow am.   K stable. Will re-check Mg. Continue to supp as needed with diuresis  Respiratory status continues to improve. ABX per primary.  Continue hourly IS.  Lactic acid back down on most recent check.   AF is chronic and rate controlled. No bleeding on Xarelto. No change.     Length of Stay: 5 Luane School  06/23/2016, 7:45 AM  Advanced Heart Failure Team Pager [phone]  (M-F; 7a - 4p)  Please contact [facility] for night-coverage after hours (4p -7a ) and weekends on [email or web address]   Patient seen and examined with the above-signed Advanced Practice Provider and/or Housestaff. I personally reviewed laboratory data, imaging  studies and relevant notes. I independently examined the patient and formulated the important aspects of the plan. I have edited the note to reflect any of my changes or salient points. I have personally discussed the plan with the patient and/or family.  Respiratory status continues to improve. Volume status improving after IV diuresis this am. Now a bit dizzy so will cut back. AF rate controlled. Continue Xarelto. I had her do Incentive Spirometry exercises in front of me and now able to get to almost 1,000 cc.   Hopefully to SNF tomorrow.   Arvilla Meres, MD  9:43 PM

## 2016-06-23 NOTE — Progress Notes (Signed)
Patient ID: Beth Santiago, female   DOB: [birth year], 83 y.o.   MRN: [id number]  PROGRESS NOTE    Beth Santiago  KZS:[id number] DOB: Apr 10, 1924 DOA: 06/18/2016  PCP: Sheela Stack, MD   Brief Narrative:  83 y.o. female  significant for dementia, atrial fibrillation on xarelto, coronary disease, hypertension. Pt presented with right knee pain and swelling, weakness. Knee was tapped in ED. CXR showed right upper lobe airspace opacity, likely pneumonia. Her UA showed small leukocytes. She was started on broad spectrum abx while awaiting culture results.   Assessment & Plan:   Principal Problem:   Sepsis due to pneumonia and Enterococcus UTI ([place]) / Right upper lobe pneumonia / Leukocytosis  - Sepsis criteria met on admission - Source of infection pneumonia and UTI - Urine cx with more than 100,000 colonies, enterococcus faecalis  - Blood cx negative so far - Stop vanoc and zosyn, awaiting urine cx sens report  - Lactic acid WNL  Active Problems:   Moderate right knee effusion - Tapped in ED; cx negative so far, no crystals  - Spoke with ortho on call 3/30 Dr. Edmonia Lynch, dose not look septic and would just treat conservatively  - Called Dr. Alvan Dame 4/2, said to apply ace warp and ice and use Toradol 7.5 mg IV for inflammation; would not tap at this time due to pneumonia (acute infection), pt to follow upon discharge in their office  - Continue vanco and zosyn  - X ray of the knee 4/1 showed possible pseudogout  - Less knee swelling this am    Troponin elevation - Likely demand ischemia from sepsis - Continue aspirin daily  - No chest pain    HLD (hyperlipidemia) - Continue Crestor    Hypothyroidism - Continue synthroid     Atrial fibrillation, chronic (HCC) - CHADS vasc score 4 - Continue xarelto for [facility] - Pt HR controlled without beta blockers     Chronic diastolic CHF (congestive heart failure) (HCC) - Given one dose of lasix 40 mg IV on 3/31 - Continue  lasix per cardio recommendations  - Continue strict intake and output  - Continue daily weight and strict intake and output    DVT prophylaxis: On xarelto  Code Status: full code  Family Communication: daughter in law at bedside this am Disposition Plan: home or SNF possibly in am if cleared by cardio    Consultants:   Cardio  Ortho   Procedures:   ECHO 3/30 - EF 55%  Antimicrobials:   Vanco and zosyn 06/18/2016 -->    Subjective: No overnight events.   Objective: Vitals:   06/22/16 1950 06/23/16 0459 06/23/16 0500 06/23/16 0615  BP: 122/67  (!) 149/76 (!) 141/85  Pulse: 65  61 62  Resp: 14  17 13   Temp: 97.9 F (36.6 C)  98.8 F (37.1 C) 97.6 F (36.4 C)  TempSrc: Oral  Oral Oral  SpO2: 100%  98% 100%  Weight:  81.5 kg (179 lb 9.6 oz)    Height:        Intake/Output Summary (Last 24 hours) at 06/23/16 1243 Last data filed at 06/23/16 0947  Gross per 24 hour  Intake              520 ml  Output              600 ml  Net              -80 ml   Filed Weights   06/21/16  0400 06/22/16 0304 06/23/16 0459  Weight: 79 kg (174 lb 1.6 oz) 81.8 kg (180 lb 6.4 oz) 81.5 kg (179 lb 9.6 oz)    Examination:  General exam: calm and comfortable  Respiratory system: wless wheezing in upper lung lobes  Cardiovascular system: S1 & S2 heard, Rate controlled  Gastrointestinal system: (+) BS, no distention  Central nervous system: No focal deficits  Extremities: right knee less swollen this am Skin: no lesions or ulcers, skin is warm and dry  Psychiatry: normal mood and behavior   Data Reviewed: I have personally reviewed following labs and imaging studies  CBC:  Recent Labs Lab 06/17/16 1433 06/18/16 1121 06/18/16 2016 06/19/16 0407 06/20/16 0323 06/22/16 0313 06/23/16 0415  WBC 9.0 12.2*  --  14.3* 11.7* 7.4 8.7  NEUTROABS 6.9 11.4*  --   --  9.5*  --   --   HGB 10.7* 10.9* 9.8* 9.1* 9.0* 9.1* 9.4*  HCT 34.1* 34.0* 31.5* 29.3* 28.3* 28.5* 29.5*  MCV 76.1*  77.8*  --  77.3* 77.1* 77.2* 77.4*  PLT 219 220  --  164 166 187 195   Basic Metabolic Panel:  Recent Labs Lab 06/18/16 1523 06/19/16 0407 06/20/16 0323 06/21/16 0416 06/22/16 0313 06/23/16 0415  NA  --  135 132* 130* 132* 133*  K  --  4.3 3.6 4.3 3.9 4.3  CL  --  105 101 97* 98* 97*  CO2  --  22 23 24 27 26   GLUCOSE  --  162* 120* 141* 137* 114*  BUN  --  22* 21* 17 9 12   CREATININE  --  0.98 0.87 0.80 0.66 0.88  CALCIUM  --  9.2 8.5* 8.6* 8.4* 8.7*  MG 1.8  --   --   --   --  1.8  PHOS 3.6  --   --   --   --   --    GFR: Estimated Creatinine Clearance: 46.7 mL/min (by C-G formula based on SCr of 0.88 mg/dL). Liver Function Tests:  Recent Labs Lab 06/18/16 1121  AST 43*  ALT 18  ALKPHOS 104  BILITOT 1.3*  PROT 6.9  ALBUMIN 3.6   No results for input(s): LIPASE, AMYLASE in the last 168 hours. No results for input(s): AMMONIA in the last 168 hours. Coagulation Profile: No results for input(s): INR, PROTIME in the last 168 hours. Cardiac Enzymes:  Recent Labs Lab 06/18/16 1523 06/18/16 1757 06/19/16 1449  TROPONINI 0.20* 0.21* 0.07*   BNP (last 3 results) No results for input(s): PROBNP in the last 8760 hours. HbA1C: No results for input(s): HGBA1C in the last 72 hours. CBG: No results for input(s): GLUCAP in the last 168 hours. Lipid Profile: No results for input(s): CHOL, HDL, LDLCALC, TRIG, CHOLHDL, LDLDIRECT in the last 72 hours. Thyroid Function Tests: No results for input(s): TSH, T4TOTAL, FREET4, T3FREE, THYROIDAB in the last 72 hours. Anemia Panel: No results for input(s): VITAMINB12, FOLATE, FERRITIN, TIBC, IRON, RETICCTPCT in the last 72 hours. Urine analysis:    Component Value Date/Time   COLORURINE AMBER (A) 06/18/2016 1238   APPEARANCEUR HAZY (A) 06/18/2016 1238   LABSPEC 1.023 06/18/2016 1238   PHURINE 5.0 06/18/2016 1238   GLUCOSEU >=500 (A) 06/18/2016 1238   HGBUR NEGATIVE 06/18/2016 1238   BILIRUBINUR NEGATIVE 06/18/2016 1238    KETONESUR NEGATIVE 06/18/2016 1238   PROTEINUR 30 (A) 06/18/2016 1238   UROBILINOGEN 0.2 12/09/2012 1401   NITRITE NEGATIVE 06/18/2016 1238   LEUKOCYTESUR SMALL (A) 06/18/2016 1238  Sepsis Labs: @LABRCNTIP (procalcitonin:4,lacticidven:4)    Body fluid culture     Status: None (Preliminary result)   Collection Time: 06/17/16  6:05 PM  Result Value Ref Range Status   Specimen Description KNEE RIGHT  Final   Special Requests NONE  Final   Gram Stain   Final   Culture   Final    NO GROWTH 2 DAYS Performed at [facility], [address], [place], [place] [address]    Report Status PENDING  Incomplete  Blood Culture (routine x 2)     Status: None (Preliminary result)   Collection Time: 06/18/16 11:30 AM  Result Value Ref Range Status   Specimen Description BLOOD RIGHT HAND  Final   Special Requests BOTTLES DRAWN AEROBIC AND ANAEROBIC  5CC  Final   Culture NO GROWTH < 12 HOURS  Final   Report Status PENDING  Incomplete  Blood Culture (routine x 2)     Status: None (Preliminary result)   Collection Time: 06/18/16 11:35 AM  Result Value Ref Range Status   Specimen Description BLOOD RIGHT HAND  Final   Special Requests BOTTLES DRAWN AEROBIC AND ANAEROBIC  5CC  Final   Culture NO GROWTH < 12 HOURS  Final   Report Status PENDING  Incomplete  Culture, Urine     Status: None (Preliminary result)   Collection Time: 06/18/16 12:38 PM  Result Value Ref Range Status   Specimen Description URINE, RANDOM  Final   Special Requests NONE  Final   Culture CULTURE REINCUBATED FOR BETTER GROWTH  Final   Report Status PENDING  Incomplete  MRSA PCR Screening     Status: None   Collection Time: 06/18/16  4:54 PM  Result Value Ref Range Status   MRSA by PCR NEGATIVE NEGATIVE Final      Radiology Studies: Portable Chest 1 View Result Date: 06/19/2016 Significant improvement in infiltrates as well as vascular congestion when compared with the prior exam.   Dg Chest Portable 1  View Result Date: 06/18/2016 Right upper lobe airspace opacity, likely pneumonia. Lungs elsewhere clear. There is pulmonary vascular congestion which appear stable. Pacemaker leads unchanged. There is aortic atherosclerosis. Followup PA and lateral chest radiographs recommended in 3-4 weeks following trial of antibiotic therapy to ensure resolution and exclude underlying malignancy.   Dg Knee Complete 4 Views Right Result Date: 06/17/2016 Moderate suprapatellar joint effusion. No other acute abnormality seen in the right knee. Electronically Signed   By: Marijo Conception, M.D.   On: 06/17/2016 15:14      Scheduled Meds: . aspirin  325 mg Oral Daily  . guaiFENesin  600 mg Oral BID  . ipratropium-albutero  3 mL Nebulization TID  . levothyroxine  88 mcg Oral QAC breakfast  . pantoprazole  40 mg Oral Daily  . piperacillin-tazobactam (ZOSYN)  IV  3.375 g Intravenous Q8H  . potassium chloride   20 mEq Oral Daily  . Rivaroxaban  20 mg Oral Q supper  . rosuvastatin  10 mg Oral  Mon Wed Fri  . vancomycin  750 mg Intravenous Q12H   Continuous Infusions:   LOS: 5 days    Time spent: 15 minutes  Greater than 50% of the time spent on counseling and coordinating the care.   Leisa Lenz, MD [facility] Pager [phone]  If 7PM-7AM, please contact night-coverage [email or web address] Password [facility] 06/23/2016, 12:43 PM

## 2016-06-23 NOTE — Progress Notes (Signed)
Patient ID: Beth Santiago, female   DOB: [birth year], 83 y.o.   MRN: [id number]  PROGRESS NOTE    Beth Santiago  NIO:[id number] DOB: [birth year] DOA: 06/18/2016  PCP: Sheela Stack, MD   Brief Narrative:  83 y.o. female  significant for dementia, atrial fibrillation on xarelto, coronary disease, hypertension. Pt presented with right knee pain and swelling, weakness. Knee was tapped in ED. CXR showed right upper lobe airspace opacity, likely pneumonia. Her UA showed small leukocytes. She was started on broad spectrum abx while awaiting culture results.   Assessment & Plan:   Principal Problem:   Sepsis due to pneumonia and Enterococcus UTI ([place]) / Right upper lobe pneumonia / Leukocytosis  - Sepsis criteria met on admission - Source of infection pneumonia and UTI - Urine cx with more than 100,000 colonies, enterococcus faecalis  - Blood cx negative so far - Stop vanoc and zosyn today, change to Levaquin today - Lactic acid WNL  Active Problems:   Moderate right knee effusion - Tapped in ED; cx negative so far, no crystals  - Spoke with ortho on call 3/30 Dr. Edmonia Lynch, dose not look septic and would just treat conservatively  - Called Dr. Alvan Dame 4/2, said to apply ace warp and ice and use Toradol 7.5 mg IV for inflammation; would not tap at this time due to pneumonia (acute infection), pt to follow upon discharge in their office  - X ray of the knee 4/1 showed possible pseudogout  - Less knee swelling this am    Troponin elevation - Likely demand ischemia from sepsis - Continue aspirin daily  - No chest pain    HLD (hyperlipidemia) - Continue Crestor    Hypothyroidism - Continue synthroid     Atrial fibrillation, chronic (HCC) - CHADS vasc score 4 - Continue xarelto for [facility] - Pt HR controlled without beta blockers     Chronic diastolic CHF (congestive heart failure) ([place]) - Given one dose of lasix 40 mg IV on 3/31 - Continue lasix per cardio recommendations   - Continue strict intake and output  - Continue daily weight and strict intake and output    DVT prophylaxis: On xarelto  Code Status: full code  Family Communication: daughter in law at bedside this am Disposition Plan: home or SNF possibly in am if cleared by cardio    Consultants:   Cardio  Ortho   Procedures:   ECHO 3/30 - EF 55%  Antimicrobials:   Vanco and zosyn 06/18/2016 --> 06/23/2016  Levaquin 06/23/2016 -->   Subjective: No overnight events.   Objective: Vitals:   06/23/16 0459 06/23/16 0500 06/23/16 0615 06/23/16 1401  BP:  (!) 149/76 (!) 141/85 134/65  Pulse:  61 62 63  Resp:  17 13 15   Temp:  98.8 F (37.1 C) 97.6 F (36.4 C) 98.4 F (36.9 C)  TempSrc:  Oral Oral Oral  SpO2:  98% 100% 100%  Weight: 81.5 kg (179 lb 9.6 oz)     Height:        Intake/Output Summary (Last 24 hours) at 06/23/16 1522 Last data filed at 06/23/16 1300  Gross per 24 hour  Intake              440 ml  Output              600 ml  Net             -160 ml   Filed Weights   06/21/16 0400 06/22/16 0304 06/23/16  0459  Weight: 79 kg (174 lb 1.6 oz) 81.8 kg (180 lb 6.4 oz) 81.5 kg (179 lb 9.6 oz)    Examination:  General exam: calm and comfortable  Respiratory system: wless wheezing in upper lung lobes  Cardiovascular system: S1 & S2 heard, Rate controlled  Gastrointestinal system: (+) BS, no distention  Central nervous system: No focal deficits  Extremities: right knee less swollen this am Skin: no lesions or ulcers, skin is warm and dry  Psychiatry: normal mood and behavior   Data Reviewed: I have personally reviewed following labs and imaging studies  CBC:  Recent Labs Lab 06/17/16 1433 06/18/16 1121 06/18/16 2016 06/19/16 0407 06/20/16 0323 06/22/16 0313 06/23/16 0415  WBC 9.0 12.2*  --  14.3* 11.7* 7.4 8.7  NEUTROABS 6.9 11.4*  --   --  9.5*  --   --   HGB 10.7* 10.9* 9.8* 9.1* 9.0* 9.1* 9.4*  HCT 34.1* 34.0* 31.5* 29.3* 28.3* 28.5* 29.5*  MCV 76.1*  77.8*  --  77.3* 77.1* 77.2* 77.4*  PLT 219 220  --  164 166 187 846   Basic Metabolic Panel:  Recent Labs Lab 06/18/16 1523 06/19/16 0407 06/20/16 0323 06/21/16 0416 06/22/16 0313 06/23/16 0415  NA  --  135 132* 130* 132* 133*  K  --  4.3 3.6 4.3 3.9 4.3  CL  --  105 101 97* 98* 97*  CO2  --  22 23 24 27 26   GLUCOSE  --  162* 120* 141* 137* 114*  BUN  --  22* 21* 17 9 12   CREATININE  --  0.98 0.87 0.80 0.66 0.88  CALCIUM  --  9.2 8.5* 8.6* 8.4* 8.7*  MG 1.8  --   --   --   --  1.8  PHOS 3.6  --   --   --   --   --    GFR: Estimated Creatinine Clearance: 46.7 mL/min (by C-G formula based on SCr of 0.88 mg/dL). Liver Function Tests:  Recent Labs Lab 06/18/16 1121  AST 43*  ALT 18  ALKPHOS 104  BILITOT 1.3*  PROT 6.9  ALBUMIN 3.6   No results for input(s): LIPASE, AMYLASE in the last 168 hours. No results for input(s): AMMONIA in the last 168 hours. Coagulation Profile: No results for input(s): INR, PROTIME in the last 168 hours. Cardiac Enzymes:  Recent Labs Lab 06/18/16 1523 06/18/16 1757 06/19/16 1449  TROPONINI 0.20* 0.21* 0.07*   BNP (last 3 results) No results for input(s): PROBNP in the last 8760 hours. HbA1C: No results for input(s): HGBA1C in the last 72 hours. CBG: No results for input(s): GLUCAP in the last 168 hours. Lipid Profile: No results for input(s): CHOL, HDL, LDLCALC, TRIG, CHOLHDL, LDLDIRECT in the last 72 hours. Thyroid Function Tests: No results for input(s): TSH, T4TOTAL, FREET4, T3FREE, THYROIDAB in the last 72 hours. Anemia Panel: No results for input(s): VITAMINB12, FOLATE, FERRITIN, TIBC, IRON, RETICCTPCT in the last 72 hours. Urine analysis:    Component Value Date/Time   COLORURINE AMBER (A) 06/18/2016 1238   APPEARANCEUR HAZY (A) 06/18/2016 1238   LABSPEC 1.023 06/18/2016 1238   PHURINE 5.0 06/18/2016 1238   GLUCOSEU >=500 (A) 06/18/2016 1238   HGBUR NEGATIVE 06/18/2016 1238   BILIRUBINUR NEGATIVE 06/18/2016 1238    KETONESUR NEGATIVE 06/18/2016 1238   PROTEINUR 30 (A) 06/18/2016 1238   UROBILINOGEN 0.2 12/09/2012 1401   NITRITE NEGATIVE 06/18/2016 1238   LEUKOCYTESUR SMALL (A) 06/18/2016 1238   Sepsis Labs: @  LABRCNTIP(procalcitonin:4,lacticidven:4)    Body fluid culture     Status: None (Preliminary result)   Collection Time: 06/17/16  6:05 PM  Result Value Ref Range Status   Specimen Description KNEE RIGHT  Final   Special Requests NONE  Final   Gram Stain   Final   Culture   Final    NO GROWTH 2 DAYS Performed at [facility], [address] [place], [place] [address]    Report Status PENDING  Incomplete  Blood Culture (routine x 2)     Status: None (Preliminary result)   Collection Time: 06/18/16 11:30 AM  Result Value Ref Range Status   Specimen Description BLOOD RIGHT HAND  Final   Special Requests BOTTLES DRAWN AEROBIC AND ANAEROBIC  5CC  Final   Culture NO GROWTH < 12 HOURS  Final   Report Status PENDING  Incomplete  Blood Culture (routine x 2)     Status: None (Preliminary result)   Collection Time: 06/18/16 11:35 AM  Result Value Ref Range Status   Specimen Description BLOOD RIGHT HAND  Final   Special Requests BOTTLES DRAWN AEROBIC AND ANAEROBIC  5CC  Final   Culture NO GROWTH < 12 HOURS  Final   Report Status PENDING  Incomplete  Culture, Urine     Status: None (Preliminary result)   Collection Time: 06/18/16 12:38 PM  Result Value Ref Range Status   Specimen Description URINE, RANDOM  Final   Special Requests NONE  Final   Culture CULTURE REINCUBATED FOR BETTER GROWTH  Final   Report Status PENDING  Incomplete  MRSA PCR Screening     Status: None   Collection Time: 06/18/16  4:54 PM  Result Value Ref Range Status   MRSA by PCR NEGATIVE NEGATIVE Final      Radiology Studies: Portable Chest 1 View Result Date: 06/19/2016 Significant improvement in infiltrates as well as vascular congestion when compared with the prior exam.   Dg Chest Portable 1  View Result Date: 06/18/2016 Right upper lobe airspace opacity, likely pneumonia. Lungs elsewhere clear. There is pulmonary vascular congestion which appear stable. Pacemaker leads unchanged. There is aortic atherosclerosis. Followup PA and lateral chest radiographs recommended in 3-4 weeks following trial of antibiotic therapy to ensure resolution and exclude underlying malignancy.   Dg Knee Complete 4 Views Right Result Date: 06/17/2016 Moderate suprapatellar joint effusion. No other acute abnormality seen in the right knee. Electronically Signed   By: Marijo Conception, M.D.   On: 06/17/2016 15:14      Scheduled Meds: . aspirin  325 mg Oral Daily  . guaiFENesin  600 mg Oral BID  . ipratropium-albutero  3 mL Nebulization TID  . levothyroxine  88 mcg Oral QAC breakfast  . pantoprazole  40 mg Oral Daily  . piperacillin-tazobactam (ZOSYN)  IV  3.375 g Intravenous Q8H  . potassium chloride   20 mEq Oral Daily  . Rivaroxaban  20 mg Oral Q supper  . rosuvastatin  10 mg Oral  Mon Wed Fri  . vancomycin  750 mg Intravenous Q12H   Continuous Infusions:   LOS: 5 days    Time spent: 15 minutes  Greater than 50% of the time spent on counseling and coordinating the care.   Leisa Lenz, MD [facility] Pager [phone]  If 7PM-7AM, please contact night-coverage [email or web address] Password [email or web address] 06/23/2016, 3:22 PM

## 2016-06-23 NOTE — Progress Notes (Signed)
Pharmacy Antibiotic Note  Beth Santiago is a 83 y.o. female admitted on 06/18/2016 with sepsis secondary to PNA and E.faecalis UTI.  Pharmacy has been consulted to narrow from vancomycin and Zosyn to Levaquin.  She is afebrile and her WBC is WNL.  Her renal function is stable.  Plan: - Levaquin  PO Q48H, start tonight - Pharmacy will sign off and follow peripherally.  Thank you for the consult!   Height:  (167.6 cm) Weight: 179 lb 9.6 oz (81.5 kg) IBW/kg (Calculated) : 59.3  Temp (24hrs), Avg:98.2 F (36.8 C), Min:97.6 F (36.4 C), Max:98.8 F (37.1 C)   Recent Labs Lab 06/18/16 1121 06/18/16 1144 06/18/16 1523 06/18/16 1757 06/19/16 0407 06/20/16 0323 06/21/16 0416 06/22/16 0313 06/23/16 0415 06/23/16 1206  WBC 12.2*  --   --   --  14.3* 11.7*  --  7.4 8.7  --   CREATININE 0.88  --   --   --  0.98 0.87 0.80 0.66 0.88  --   LATICACIDVEN  --  5.07* 2.7* 4.0*  --   --  1.2  --   --   --   VANCOTROUGH  --   --   --   --   --   --   --   --   --  20    Estimated Creatinine Clearance: 46.7 mL/min (by C-G formula based on SCr of 0.88 mg/dL).    No Known Allergies   Vanco 3/29>>4/3 Zosyn 3/29>>4/3 LVQ 4/3  4/3 VT 20>>given age 61 risk for accumulation - decrease dose to  q12  3/29 BC x 2: NG 3/29 MRSA PCR: negative 3/29 UCx: >100k Enterococcus faecalis (sens IP)   [name] D. Laney Potash, PharmD, BCPS Pager:  [phone] 06/23/2016, 3:28 PM

## 2016-06-23 NOTE — Progress Notes (Signed)
CSW spoke with patient and family at bedside to discuss SNF placement. Patient stated she lives in [place] [facility] [place] and would prefer [facility]. Family was in agreement and would like the facility since it is close to her support system. Admissions Coordinator was made aware of patients pending discharge.  Beth Santiago, MSW,  [employer] [phone]

## 2016-06-23 NOTE — Progress Notes (Signed)
Pharmacy Antibiotic Note  Beth Santiago is a 83 y.o. female admitted on 06/18/2016 with sepsis secondary to pneumonia and UTI. Pharmacy has been consulted for vancomycin/zosyn dosing.   Renal function has remained stable, blood cultures show no growth, urine culture is growing Enterococcus faecalis (susceptibilities pending).   Vancomycin level today was at upper end of goal, given age and risk for accumulation will decrease dose.  Plan: -Continue Zosyn 3.375g IV q8h EI -Decrease vancomycin  IV q12h -Monitor renal function, LOT, sensitivities closely - can likely narrow once Enterococcus culture susceptibilities return   Height:  (167.6 cm) Weight: 179 lb 9.6 oz (81.5 kg) IBW/kg (Calculated) : 59.3  Temp (24hrs), Avg:98.2 F (36.8 C), Min:97.6 F (36.4 C), Max:98.8 F (37.1 C)   Recent Labs Lab 06/18/16 1121 06/18/16 1144 06/18/16 1523 06/18/16 1757 06/19/16 0407 06/20/16 0323 06/21/16 0416 06/22/16 0313 06/23/16 0415 06/23/16 1206  WBC 12.2*  --   --   --  14.3* 11.7*  --  7.4 8.7  --   CREATININE 0.88  --   --   --  0.98 0.87 0.80 0.66 0.88  --   LATICACIDVEN  --  5.07* 2.7* 4.0*  --   --  1.2  --   --   --   VANCOTROUGH  --   --   --   --   --   --   --   --   --  20    Estimated Creatinine Clearance: 46.7 mL/min (by C-G formula based on SCr of 0.88 mg/dL).    No Known Allergies  Antimicrobials: Vanco 3/29>> Zosyn 3/29>>  Dose Adjustments: 4/3 VT 20>>decrease vancomycin to 500 q12 hours  Microbiology: 3/29 BCx: NGTD 3/29 UCx: >100k Enterococcus faecalis (susceptibilities pending) 3/29 MRSA PCR: negative  Sheppard Coil PharmD., BCPS Clinical Pharmacist Pager [phone] 06/23/2016 3:05 PM

## 2016-06-23 NOTE — Progress Notes (Signed)
Physical Therapy Treatment Patient Details Name: Beth Santiago MRN: [id number] DOB: 1924-11-18 Today's Date: 06/23/2016    History of Present Illness Patient is a 83 y/o female who presents with right knee pain/swelling and SOB. Knee was tapped in ED. CXR showed right upper lobe airspace opacity, likely pneumonia. UA showed small leukocytes. Admitted with sepsis secondary to PNA and UTI.     PT Comments    Pt moving much better than last session, but still needing increased A and needs more A than her daughter can give her at home.  Con't to recommend short term SNF and family aware of recommendation and supportive. They are requesting [facility] and discussed with social work.  Lengthy discussion with family regarding d/c planning.  Follow Up Recommendations  SNF     Equipment Recommendations  None recommended by PT    Recommendations for Other Services       Precautions / Restrictions Precautions Precautions: Fall Restrictions Weight Bearing Restrictions: No    Mobility  Bed Mobility               General bed mobility comments: Sitting up in recliner upon arrival.  Transfers Overall transfer level: Needs assistance Equipment used: Rolling walker (2 wheeled) Transfers: Sit to/from Stand Sit to Stand: Min guard;Min assist         General transfer comment: min/guard initially, but needed MIN A to finish powering up.  Ambulation/Gait Ambulation/Gait assistance: Min assist Ambulation Distance (Feet): 30 Feet Assistive device: Rolling walker (2 wheeled) Gait Pattern/deviations: Step-to pattern;Trunk flexed Gait velocity: decreased   General Gait Details: Pt needs cues for RW placement and needed A with turning.  Pt with 2/4 dyspnea with o2 sat in 90's on room air   Stairs            Wheelchair Mobility    Modified Rankin (Stroke Patients Only)       Balance                                             Cognition Arousal/Alertness: Awake/alert Behavior During Therapy: WFL for tasks assessed/performed Overall Cognitive Status: History of cognitive impairments - at baseline                                 General Comments: repetitive questions      Exercises      General Comments General comments (skin integrity, edema, etc.): Lengthy discussion with patient and daughter from [place] regarding home with HHPT vs SNF.        Pertinent Vitals/Pain Pain Assessment: No/denies pain    Home Living                      Prior Function            PT Goals (current goals can now be found in the care plan section) Acute Rehab PT Goals Patient Stated Goal: to get stronger and be able to walk PT Goal Formulation: With patient/family Time For Goal Achievement: 07/04/16 Potential to Achieve Goals: Good Progress towards PT goals: Progressing toward goals    Frequency    Min 3X/week      PT Plan Current plan remains appropriate;Frequency needs to be updated    Co-evaluation  End of Session Equipment Utilized During Treatment: Gait belt Activity Tolerance: Patient tolerated treatment well;Patient limited by fatigue Patient left: in chair;with call bell/phone within reach;with chair alarm set;with family/visitor present Nurse Communication: Mobility status PT Visit Diagnosis: Difficulty in walking, not elsewhere classified (R26.2)     Time: 1610-9604 PT Time Calculation (min) (ACUTE ONLY): 31 min  Charges:  $Gait Training: 8-22 mins $Therapeutic Activity: 8-22 mins                    G Codes:       Beth Santiago,  Pager [phone] 06/23/2016    Enzo Montgomery 06/23/2016, 12:08 PM

## 2016-06-24 DIAGNOSIS — R7989 Other specified abnormal findings of blood chemistry: Secondary | ICD-10-CM

## 2016-06-24 DIAGNOSIS — E872 Acidosis, unspecified: Secondary | ICD-10-CM

## 2016-06-24 DIAGNOSIS — M25461 Effusion, right knee: Secondary | ICD-10-CM

## 2016-06-24 DIAGNOSIS — R778 Other specified abnormalities of plasma proteins: Secondary | ICD-10-CM

## 2016-06-24 DIAGNOSIS — R748 Abnormal levels of other serum enzymes: Secondary | ICD-10-CM

## 2016-06-24 DIAGNOSIS — E871 Hypo-osmolality and hyponatremia: Secondary | ICD-10-CM | POA: Clinically undetermined

## 2016-06-24 DIAGNOSIS — E876 Hypokalemia: Secondary | ICD-10-CM

## 2016-06-24 DIAGNOSIS — E785 Hyperlipidemia, unspecified: Secondary | ICD-10-CM

## 2016-06-24 LAB — BASIC METABOLIC PANEL
Anion gap: 9 (ref 5–15)
Anion gap: 9 (ref 5–15)
BUN: 13 mg/dL (ref 6–20)
BUN: 13 mg/dL (ref 6–20)
CHLORIDE: 94 mmol/L — AB (ref 101–111)
CO2: 24 mmol/L (ref 22–32)
CO2: 22 mmol/L (ref 22–32)
CREATININE: 0.89 mg/dL (ref 0.44–1.00)
Calcium: 9 mg/dL (ref 8.9–10.3)
Calcium: 8.6 mg/dL — ABNORMAL LOW (ref 8.9–10.3)
Chloride: 100 mmol/L — ABNORMAL LOW (ref 101–111)
Creatinine, Ser: 0.84 mg/dL (ref 0.44–1.00)
GFR calc Af Amer: >60 mL/min (ref >60)
GFR calc Af Amer: >60 mL/min (ref >60)
GFR calc non Af Amer: 56 mL/min — ABNORMAL LOW (ref >60)
GFR calc non Af Amer: 60 mL/min — ABNORMAL LOW (ref >60)
GLUCOSE: 130 mg/dL — AB (ref 65–99)
Glucose, Bld: 144 mg/dL — ABNORMAL HIGH (ref 65–99)
POTASSIUM: 4.6 mmol/L (ref 3.5–5.1)
Potassium: 4.7 mmol/L (ref 3.5–5.1)
SODIUM: 127 mmol/L — AB (ref 135–145)
Sodium: 131 mmol/L — ABNORMAL LOW (ref 135–145)

## 2016-06-24 LAB — CBC WITH DIFFERENTIAL/PLATELET
Basophils Absolute: 0 10*3/uL (ref 0.0–0.1)
Basophils Relative: 0 %
EOS ABS: 0.3 10*3/uL (ref 0.0–0.7)
Eosinophils Relative: 3 %
HEMATOCRIT: 32.4 % — AB (ref 36.0–46.0)
HEMOGLOBIN: 10.3 g/dL — AB (ref 12.0–15.0)
LYMPHS ABS: 1.6 10*3/uL (ref 0.7–4.0)
LYMPHS PCT: 14 %
MCH: 24.3 pg — ABNORMAL LOW (ref 26.0–34.0)
MCHC: 31.8 g/dL (ref 30.0–36.0)
MCV: 76.6 fL — ABNORMAL LOW (ref 78.0–100.0)
MONOS PCT: 8 %
Monocytes Absolute: 0.9 10*3/uL (ref 0.1–1.0)
NEUTROS PCT: 75 %
Neutro Abs: 8.1 10*3/uL — ABNORMAL HIGH (ref 1.7–7.7)
Platelets: 272 10*3/uL (ref 150–400)
RBC: 4.23 MIL/uL (ref 3.87–5.11)
RDW: 19.2 % — ABNORMAL HIGH (ref 11.5–15.5)
WBC: 10.9 10*3/uL — ABNORMAL HIGH (ref 4.0–10.5)

## 2016-06-24 MED ORDER — DOCUSATE SODIUM 100 MG PO CAPS: 100.0000 mg | ORAL_CAPSULE | Freq: Two times a day (BID) | ORAL | 0 refills | Status: DC

## 2016-06-24 MED ORDER — FUROSEMIDE 40 MG PO TABS
40.0000 mg | ORAL_TABLET | Freq: Every day | ORAL | Status: DC
  Administered 2016-06-24: 40 mg via ORAL
  Filled 2016-06-24: qty 1

## 2016-06-24 MED ORDER — LEVOFLOXACIN 750 MG PO TABS: 750.0000 mg | ORAL_TABLET | ORAL | 0 refills | Status: AC

## 2016-06-24 MED ORDER — NEBIVOLOL HCL 5 MG PO TABS
2.5000 mg | ORAL_TABLET | Freq: Every day | ORAL | Status: DC
  Administered 2016-06-24: 2.5 mg via ORAL
  Filled 2016-06-24: qty 1

## 2016-06-24 MED ORDER — CALCIUM CARBONATE ANTACID 500 MG PO CHEW: 1.0000 | CHEWABLE_TABLET | ORAL | Status: DC | PRN

## 2016-06-24 MED ORDER — GUAIFENESIN ER 600 MG PO TB12: 600.0000 mg | ORAL_TABLET | Freq: Two times a day (BID) | ORAL | 0 refills | Status: AC

## 2016-06-24 NOTE — Progress Notes (Signed)
PROGRESS NOTE    Beth Santiago  KVQ:[id number] DOB: 1925-02-08 DOA: 06/18/2016 PCP: Sheela Stack, MD    Brief Narrative:  82 y.o.femalesignificant for dementia, atrial fibrillation on xarelto, coronary disease, hypertension. Pt presented with right knee pain and swelling, weakness. Knee was tapped in ED. CXR showed right upper lobe airspace opacity, likely pneumonia. Her UA showed small leukocytes. She was started on broad spectrum abx while awaiting culture results.     Assessment & Plan:   Principal Problem:   Sepsis due to pneumonia [facility]) Active Problems:   HLD (hyperlipidemia)   HYPERTENSION, BENIGN   Atrial fibrillation (HCC)   Acute on chronic diastolic congestive heart failure ([place])   Community acquired pneumonia of right upper lobe of lung ([place])   Other specified hypothyroidism   Elevated troponin   Lactic acidosis   Hypokalemia   Hyponatremia  Sepsis due to pneumonia and Enterococcus UTI ([place]) / Right upper lobe pneumonia / Leukocytosis  - Sepsis criteria met on admission - Source of infection pneumonia and UTI - Urine cx with more than 100,000 colonies, enterococcus faecalis  - Blood cx negative so far - Patient afebrile. - Discontinued IV vanc and zosyn on 06/23/2016. Patient currently on oral Levaquin.  - Lactic acid WNL  Active Problems:   Moderate right knee effusion - Tapped in ED; cx negative so far, no crystals  - Spoke with ortho on call 3/30 Dr. Edmonia Lynch, dose not look septic and would just treat conservatively  - Called Dr. Alvan Dame 4/2, said to apply ace warp and ice and use Toradol 7.5 mg IV for inflammation; would not tap at this time due to pneumonia (acute infection), pt to follow upon discharge in their office  - X ray of the knee 4/1 showed possible pseudogout  - Less knee swelling this am    Troponin elevation - Likely demand ischemia from sepsis - Continue aspirin daily, bystolic,lasix - Asymptomatic. - Per  cardiology.    HLD (hyperlipidemia) - Continue Crestor    Hypothyroidism - Continue synthroid     Atrial fibrillation, chronic (HCC) - CHADS vasc score 4 - Continue xarelto for [facility] - Continue bystolic for rate control.    Chronic diastolic CHF (congestive heart failure) (HCC) - Given one dose of lasix 40 mg IV on 3/31 - 2-D echo of 06/19/2016 with a EF of 64%, grade 3 diastolic dysfunction and severe TR - Clinical improvement with IV diuresis. Patient noted to have some dizziness yesterday evening on 40 mg of IV Lasix. Patient states some improvement with dizziness however still with some slight dizziness. - Continue lasix per cardio recommendations  - Continue strict intake and output  - Continue daily weight and strict intake and output  - Patient has been transitioned to oral Lasix 40 mg daily per cardiology. - Per cardiology.  Hyponatremia Likely secondary to overdiuresis as patient had some complaints of dizziness yesterday after 40 mg of IV Lasix. IV Lasix has been changed to oral Lasix per cardiology and patient already received her daily dose. Repeat be met this afternoon. If worsening hyponatremia will check urine studies and urine and serum osmolalities as well as TSH. may need to hold diuretics for the next 24-48 hours.    DVT prophylaxis: xarelto Code Status: Full Family Communication: Updated patient and family at bedside. Disposition Plan: Skilled nursing facility when medically stable, improvement with hyponatremia and per cardiology.   Consultants:   Cardiology: Dr.BENSIMHON 06/19/2016  Procedures:   2-D echo 06/19/2016  X-ray of  the right knee 06/21/2016  Chest x-ray 06/18/2016, 06/19/2016, 06/21/2016  Antimicrobials:   Oral Levaquin 06/23/2016  IV Zosyn 06/18/2016>>>>> 06/23/2016  IV vancomycin 06/18/2016>>>> 06/23/2016   Subjective: Patient sitting up in chair. Family at bedside. Patient denies any chest pain. No shortness of breath.  Patient states some improvement with the dizziness however still with some dizziness. Patient denies any right knee pain.  Objective: Vitals:   06/23/16 1401 06/23/16 2122 06/24/16 0440 06/24/16 0852  BP: 134/65 (!) 143/72 (!) 147/77 134/65  Pulse: 63 66 63 62  Resp: _0 Temp: 98.4 F (36.9 C) 97.6 F (36.4 C) 97.5 F (36.4 C)   TempSrc: Oral Oral Oral   SpO2: 100% 97% 95% 95%  Weight:   82.3 kg (181 lb 6.4 oz)   Height:        Intake/Output Summary (Last 24 hours) at 06/24/16 1045 Last data filed at 06/24/16 0800  Gross per 24 hour  Intake              460 ml  Output             1250 ml  Net             -790 ml   Filed Weights   06/22/16 0304 06/23/16 0459 06/24/16 0440  Weight: 81.8 kg (180 lb 6.4 oz) 81.5 kg (179 lb 9.6 oz) 82.3 kg (181 lb 6.4 oz)    Examination:  General exam: Appears calm and comfortable  Respiratory system: Clear to auscultation. Respiratory effort normal. Cardiovascular system: S1 & S2 heard, RRR. No JVD, murmurs, rubs, gallops or clicks. No pedal edema. Gastrointestinal system: Abdomen is nondistended, soft and nontender. No organomegaly or masses felt. Normal bowel sounds heard. Central nervous system: Alert and oriented. No focal neurological deficits. Extremities: Symmetric 5 x 5 power.Right knee with no warmth, nontender to palpation. Skin: No rashes, lesions or ulcers Psychiatry: Judgement and insight appear normal. Mood & affect appropriate.     Data Reviewed: I have personally reviewed following labs and imaging studies  CBC:  Recent Labs Lab 06/17/16 1433 06/18/16 1121  06/19/16 0407 06/20/16 0323 06/22/16 0313 06/23/16 0415 06/24/16 0507  WBC 9.0 12.2*  --  14.3* 11.7* 7.4 8.7 10.9*  NEUTROABS 6.9 11.4*  --   --  9.5*  --   --  8.1*  HGB 10.7* 10.9*  < > 9.1* 9.0* 9.1* 9.4* 10.3*  HCT 34.1* 34.0*  < > 29.3* 28.3* 28.5* 29.5* 32.4*  MCV 76.1* 77.8*  --  77.3* 77.1* 77.2* 77.4* 76.6*  PLT 219 220  --  164 166 187  229 272  < > = values in this interval not displayed. Basic Metabolic Panel:  Recent Labs Lab 06/18/16 1523  06/20/16 0323 06/21/16 0416 06/22/16 0313 06/23/16 0415 06/24/16 0507  NA  --   < > 132* 130* 132* 133* 127*  K  --   < > 3.6 4.3 3.9 4.3 4.6  CL  --   < > 101 97* 98* 97* 94*  CO2  --   < > _1 GLUCOSE  --   < > 120* 141* 137* 114* 130*  BUN  --   < > 21* _2 CREATININE  --   < > 0.87 0.80 0.66 0.88 0.89  CALCIUM  --   < > 8.5* 8.6* 8.4* 8.7* 9.0  MG 1.8  --   --   --   --  1.8  --   PHOS 3.6  --   --   --   --   --   --   < > = values in this interval not displayed. GFR: Estimated Creatinine Clearance: 46.3 mL/min (by C-G formula based on SCr of 0.89 mg/dL). Liver Function Tests:  Recent Labs Lab 06/18/16 1121  AST 43*  ALT 18  ALKPHOS 104  BILITOT 1.3*  PROT 6.9  ALBUMIN 3.6   No results for input(s): LIPASE, AMYLASE in the last 168 hours. No results for input(s): AMMONIA in the last 168 hours. Coagulation Profile: No results for input(s): INR, PROTIME in the last 168 hours. Cardiac Enzymes:  Recent Labs Lab 06/18/16 1523 06/18/16 1757 06/19/16 1449  TROPONINI 0.20* 0.21* 0.07*   BNP (last 3 results) No results for input(s): PROBNP in the last 8760 hours. HbA1C: No results for input(s): HGBA1C in the last 72 hours. CBG: No results for input(s): GLUCAP in the last 168 hours. Lipid Profile: No results for input(s): CHOL, HDL, LDLCALC, TRIG, CHOLHDL, LDLDIRECT in the last 72 hours. Thyroid Function Tests: No results for input(s): TSH, T4TOTAL, FREET4, T3FREE, THYROIDAB in the last 72 hours. Anemia Panel: No results for input(s): VITAMINB12, FOLATE, FERRITIN, TIBC, IRON, RETICCTPCT in the last 72 hours. Sepsis Labs:  Recent Labs Lab 06/18/16 1144 06/18/16 1523 06/18/16 1757 06/21/16 0416  LATICACIDVEN 5.07* 2.7* 4.0* 1.2    Recent Results (from the past 240 hour(s))  Body fluid culture     Status: None   Collection  Time: 06/17/16  6:05 PM  Result Value Ref Range Status   Specimen Description KNEE RIGHT  Final   Special Requests NONE  Final   Gram Stain   Final    NO ORGANISMS SEEN ABUNDANT WBC PRESENT, PREDOMINANTLY PMN Gram Stain Report Called to,Read Back By and Verified With: K.[place] RN AT [phone] ON 06/17/16 BY S.VANHOORNE    Culture   Final    NO GROWTH 3 DAYS Performed at [facility], [place] [address] [place], [place] [address]    Report Status 06/21/2016 FINAL  Final  Blood Culture (routine x 2)     Status: None   Collection Time: 06/18/16 11:30 AM  Result Value Ref Range Status   Specimen Description BLOOD RIGHT HAND  Final   Special Requests BOTTLES DRAWN AEROBIC AND ANAEROBIC  5CC  Final   Culture NO GROWTH 5 DAYS  Final   Report Status 06/23/2016 FINAL  Final  Blood Culture (routine x 2)     Status: None   Collection Time: 06/18/16 11:35 AM  Result Value Ref Range Status   Specimen Description BLOOD RIGHT HAND  Final   Special Requests BOTTLES DRAWN AEROBIC AND ANAEROBIC  5CC  Final   Culture NO GROWTH 5 DAYS  Final   Report Status 06/23/2016 FINAL  Final  Culture, Urine     Status: Abnormal (Preliminary result)   Collection Time: 06/18/16 12:38 PM  Result Value Ref Range Status   Specimen Description URINE, RANDOM  Final   Special Requests NONE  Final   Culture (A)  Final    >=100,000 COLONIES/mL ENTEROCOCCUS FAECALIS Sent to [place] for further susceptibility testing. RESULT CALLED TO, READ BACK BY AND VERIFIED WITH: Ranae Pila, RN AT 0945 ON 06/22/16 BY C. JESSUP, MLT.    Report Status PENDING  Incomplete  Susceptibility, Aer + Anaerob     Status: None   Collection Time: 06/18/16 12:38 PM  Result Value Ref Range Status  Suscept, Aer + Anaerob Preliminary report  Final    Comment: (NOTE) Performed At: [facility] [place], [place] [id number] Lindon Romp MD [id number]    Source of Sample URINE, RANDOM  Final    Comment: E. FAECIUM   Susceptibility Result     Status: None (Preliminary result)   Collection Time: 06/18/16 12:38 PM  Result Value Ref Range Status   Suscept Result 1 Enterococcus faecium  Final    Comment: (NOTE) Identification performed by account, not confirmed by this laboratory. Performed At: [facility] [place], [place] [id number] Lindon Romp MD QM:[id number]    Antimicrobial Suscept PENDING  Incomplete  MRSA PCR Screening     Status: None   Collection Time: 06/18/16  4:54 PM  Result Value Ref Range Status   MRSA by PCR NEGATIVE NEGATIVE Final    Comment:        The GeneXpert MRSA Assay (FDA approved for NASAL specimens only), is one component of a comprehensive MRSA colonization surveillance program. It is not intended to diagnose MRSA infection nor to guide or monitor treatment for MRSA infections.          Radiology Studies: No results found.      Scheduled Meds: . docusate sodium  100 mg Oral BID  . furosemide  40 mg Oral Daily  . guaiFENesin  600 mg Oral BID  . ketorolac  7.5 mg Intravenous Q6H  . latanoprost  1 drop Both Eyes QHS  . levofloxacin  750 mg Oral Q48H  . levothyroxine  88 mcg Oral QAC breakfast  . nebivolol  2.5 mg Oral Daily  . pantoprazole  40 mg Oral Daily  . potassium chloride SA  20 mEq Oral Daily  . Rivaroxaban  20 mg Oral Q supper  . rosuvastatin  10 mg Oral Once per day on Mon Wed Fri  . sodium chloride flush  3 mL Intravenous Q12H  . timolol  1 drop Both Eyes q morning - 10a   Continuous Infusions:   LOS: 6 days    Time spent: 35 minutes    Kely Dohn, MD [facility] Pager [phone]  If 7PM-7AM, please contact night-coverage [email or web address] Password [email or web address] 06/24/2016, 10:45 AM

## 2016-06-24 NOTE — Progress Notes (Addendum)
Advanced Heart Failure Rounding Note   Subjective:    CXR 06/21/16 with slightly worsening fluid vs PNA. Personally reviewed.  Feeling better today.  Had some difficulty urinating last night, but much improved this am. Breathing much improved from last few days.  Denies SOB getting around room. Still needs assistance for transfers which is slightly worse than her baseline.   Creatinine stable at 0.8 compared to yesterday. K 4.6.    Out 300 cc with 40 mg IV lasix yesterday. Weight shows up 2 lbs.     Objective:   Weight Range:  Vital Signs:   Temp:  [97.5 F (36.4 C)-98.4 F (36.9 C)] 97.5 F (36.4 C) (04/04 0440) Pulse Rate:  [63-66] 63 (04/04 0440) Resp:  [15-17] 17 (04/04 0440) BP: (134-147)/(65-77) 147/77 (04/04 0440) SpO2:  [95 %-100 %] 95 % (04/04 0440) Weight:  [181 lb 6.4 oz (82.3 kg)] 181 lb 6.4 oz (82.3 kg) (04/04 0440) Last BM Date:  (per pt is has been a while)  Weight change: Filed Weights   06/22/16 0304 06/23/16 0459 06/24/16 0440  Weight: 180 lb 6.4 oz (81.8 kg) 179 lb 9.6 oz (81.5 kg) 181 lb 6.4 oz (82.3 kg)    Intake/Output:   Intake/Output Summary (Last 24 hours) at 06/24/16 0738 Last data filed at 06/24/16 0500  Gross per 24 hour  Intake              580 ml  Output              875 ml  Net             -295 ml     Physical Exam: General:  Elderly. Lying in bed. NAD.  HEENT: Normocephalic and atraumatic.  Neck: Supple. JVP 8-9 cm with prominent CV waves. Carotids 2+ bilat; no bruits. No thyromegaly or nodule noted.   Cor: PMI non-displaced. RRR.   2/6 TR murmur Lungs: Mildly diminished basilar sounds that have improved from yesterday.  Abdomen: soft, NT, ND, no HSM. No bruits or masses. +BS  Extremities. No cyanosis, clubbing or rash. Trace to 1+ ankle edema. R knee remains tender and slightly warm to touch.  Neuro: Alert & oriented x 3. Cranial nerves grossly intact. Moves all 4 extremities w/o difficulty. Affect pleasant    Telemetry:  Reviewed personally, AF with V pacing in 60s     Labs: Basic Metabolic Panel:  Recent Labs Lab 06/18/16 1523  06/20/16 0323 06/21/16 0416 06/22/16 0313 06/23/16 0415 06/24/16 0507  NA  --   < > 132* 130* 132* 133* 127*  K  --   < > 3.6 4.3 3.9 4.3 4.6  CL  --   < > 101 97* 98* 97* 94*  CO2  --   < > GLUCOSE  --   < > 120* 141* 137* 114* 130*  BUN  --   < > 21* CREATININE  --   < > 0.87 0.80 0.66 0.88 0.89  CALCIUM  --   < > 8.5* 8.6* 8.4* 8.7* 9.0  MG 1.8  --   --   --   --  1.8  --   PHOS 3.6  --   --   --   --   --   --   < > = values in this interval not displayed. Liver Function Tests:  Recent Labs Lab 06/18/16 1121  AST 43*  ALT  18  ALKPHOS 104  BILITOT 1.3*  PROT 6.9  ALBUMIN 3.6   No results for input(s): LIPASE, AMYLASE in the last 168 hours. No results for input(s): AMMONIA in the last 168 hours.  CBC:  Recent Labs Lab 06/17/16 1433 06/18/16 1121  06/19/16 0407 06/20/16 0323 06/22/16 0313 06/23/16 0415 06/24/16 0507  WBC 9.0 12.2*  --  14.3* 11.7* 7.4 8.7 10.9*  NEUTROABS 6.9 11.4*  --   --  9.5*  --   --  8.1*  HGB 10.7* 10.9*  < > 9.1* 9.0* 9.1* 9.4* 10.3*  HCT 34.1* 34.0*  < > 29.3* 28.3* 28.5* 29.5* 32.4*  MCV 76.1* 77.8*  --  77.3* 77.1* 77.2* 77.4* 76.6*  PLT 219 220  --  164 166 187 229 272  < > = values in this interval not displayed.  Cardiac Enzymes:  Recent Labs Lab 06/18/16 1523 06/18/16 1757 06/19/16 1449  TROPONINI 0.20* 0.21* 0.07*   BNP: BNP (last 3 results)  Recent Labs  03/11/16 1241  BNP 570.5*   ProBNP (last 3 results) No results for input(s): PROBNP in the last 8760 hours.  Other results:  Imaging: No results found.   Medications:     Scheduled Medications: . aspirin  325 mg Oral Daily  . docusate sodium  100 mg Oral BID  . guaiFENesin  600 mg Oral BID  . ketorolac  7.5 mg Intravenous Q6H  . latanoprost  1 drop Both Eyes QHS  . levofloxacin  750 mg Oral Q48H  .  levothyroxine  88 mcg Oral QAC breakfast  . pantoprazole  40 mg Oral Daily  . potassium chloride SA  20 mEq Oral Daily  . Rivaroxaban  20 mg Oral Q supper  . rosuvastatin  10 mg Oral Once per day on Mon Wed Fri  . sodium chloride flush  3 mL Intravenous Q12H  . timolol  1 drop Both Eyes q morning - 10a    Infusions:   PRN Medications: acetaminophen **OR** acetaminophen, albuterol, calcium carbonate, hydrALAZINE, HYDROcodone-acetaminophen, ipratropium-albuterol, ondansetron **OR** ondansetron (ZOFRAN) IV, zolpidem   Assessment/Plan   1. Acute on chronic diastolic CHF   --EF 60% echo 06/19/16 - Volume status much improved with IV lasix. Received 40 mg IV yesterday and became slightly dizzy in the evening.  - Transition to po diuretics today for home/SNF.  Will resume previous home dose of 40 mg daily.  2. CAP - ABX per primary as below.  - Continue hourly incentive spirometer.  3. Acute respiratory failure - Breathing status much improved with diuresis and ABX.  - Completed Vanc/Zosyn. Narrowed to Levaquin per primary.  4. Permanent atrial fibrillation.  - Chronic and rate controlled. - Continue Xarelto.  No bleeding on Xarelto.  5. Nonobstructive CAD  - See elevated troponin below.  6. Urinary tract infection - Completed Vanc/Zosyn and narrowed to Levaquin 06/23/16. 7. History of CHB s/p pacemaker  - Stable with no interruptions in pacing on tele.  8. Elevated troponin - Low suspicion for ACS. Likely demand ischemia in setting of sepsis 2/2 UTI + PNA.  - No CP. Continue statin. Stop ASA with non-obstructive CAD and Xarelto.  9. Lactic acidosis - Resolved with ABX and fluid resuscitation.  10. Hypokalemia - Resolved with supplementation.  11. HTN - Pressures elevated into 130-140s now mostly recovered from infection.  Would resume bystolic at 2.5 mg daily and her amlodipine at 5 mg daily on discharge.   12. Deconditioning - PT following. Planning for SNF.  OK for SNF from  HF perspective.   HF meds for home Bystolic 2.5 mg daily Amlodipine 5 mg daily Lasix 40 mg daily Potassium 20 meq daily Xarelto 20 mg daily  Follow up scheduled for 07/02/16 at 1000 am.   Length of Stay: [address] Graciella Freer, Cordelia Poche  06/24/2016, 7:38 AM  Advanced Heart Failure Team Pager [phone] (M-F; 7a - 4p)  Please contact [facility] for night-coverage after hours (4p -7a ) and weekends on [email or web address]  Patient seen and examined with the above-signed Advanced Practice Provider and/or Housestaff. I personally reviewed laboratory data, imaging studies and relevant notes. I independently examined the patient and formulated the important aspects of the plan. I have edited the note to reflect any of my changes or salient points. I have personally discussed the plan with the patient and/or family.  She is improved. Ok for d/c from our standpoint on above meds. Will see in HF Clinic soon.   Arvilla Meres, MD  8:56 PM

## 2016-06-24 NOTE — Discharge Summary (Signed)
Physician Discharge Summary  Beth Santiago MRN:[id number] DOB: 09/17/1924 DOA: 06/18/2016  PCP: SOUTH,[name] ALAN, MD  Admit date: 06/18/2016 Discharge date: 06/24/2016  Time spent: 65 minutes  Recommendations for Outpatient Follow-up:  1. Patient be discharged to a skilled nursing facility. Patient will follow-up with M.D. at the skilled nursing facility. Patient needs a basic metabolic profile done in 1 week to follow-up on electrolytes and renal function. 2. Follow-up at the heart failure clinic on 07/02/2016 at 10 AM. 3. Patient is to follow-up with Dr. Olin of orthopedics in 2 weeks for follow-up on moderate right knee effusion.   Discharge Diagnoses:  Principal Problem:   Sepsis due to pneumonia (HCC) Active Problems:   HLD (hyperlipidemia)   HYPERTENSION, BENIGN   Atrial fibrillation (HCC)   Acute on chronic diastolic congestive heart failure (HCC)   Community acquired pneumonia of right upper lobe of lung (HCC)   Other specified hypothyroidism   Elevated troponin   Lactic acidosis   Hypokalemia   Hyponatremia   Effusion of right knee joint   Elevated lactic acid level   Discharge Condition: Stable and improved  Diet recommendation: Heart healthy  Filed Weights   06/22/16 0304 06/23/16 0459 06/24/16 0440  Weight: 81.8 kg (180 lb 6.4 oz) 81.5 kg (179 lb 9.6 oz) 82.3 kg (181 lb 6.4 oz)    History of present illness:  Per Dr. Hobbs [name] is a 83 y.o. female  significant for dementia, atrial fibrillation, coronary disease, high blood pressure, low thyroid present to the emergency room with chief complaint of shortness of breath. Of note patient was in the emergency room 1 day prior to admission for right knee pain and had fluid drawn off. Patient then went home with her daughter and had extreme weakness the morning of admission. Patient had exhibited no symptoms of fever, chills, nausea, vomiting the days prior. But did have chills for the daughter within  admission. Patient was brought to the emergency room for evaluation.   ED course: Patient started on sepsis protocol. Hosp svc consulted. One episode of dark blood in the mouth.   Hospital Course:  Sepsis due to pneumonia and Enterococcus UTI (HCC) / Right upper lobe pneumonia / Leukocytosis  - Sepsis criteria met on admission - Patient presented with shortness of breath. Chest x-ray on admission was concerning for pneumonia as well as volume overload. Patient's workup was consistent with a pneumonia as well as a UTI. - Source of infection pneumonia and UTI - Urine cx with more than 100,000 colonies, enterococcus faecalis  - Blood cx negative so far - Patient remained afebrile. - Patient was initially placed on IV vancomycin and IV Zosyn and subsequently transitioned to oral Levaquin. Patient be discharged on Levaquin every 48 hours 3 days. Patient improved clinically. Patient remained a febrile. Patient be discharged in stable and improved condition. Outpatient follow-up.  Active Problems: Moderate right knee effusion - Tapped in ED; cx negative, no crystals  - Dr Devine spoke with ortho on call 3/30 Dr. Timothy Murphy, dose not look septic and would just treat conservatively  - Dr Devine called Dr. Olin 4/2, said to apply ace warp and ice and use Toradol 7.5 mg IV for inflammation; would not tap at this time due to pneumonia (acute infection), pt to follow upon discharge in their office  - X ray of the knee 4/1 showed possible pseudogout  - Patient swelling in the right knee improved as well as warmth and pain. Patient be discharged in   stable and improved condition on Ultram as needed. Patient will follow-up with orthopedics 2 weeks post discharge.  Troponin elevation - Likely demand ischemia from sepsis - Continued on aspirin daily, bystolic,lasix - Cardiology was consulted and followed the patient throughout the hospitalization. 2-D echo which was done on 06/19/2016 had a EF of  60%, grade 3 diastolic dysfunction with severe tricuspid regurgitation. Patient improved clinically. Patient remained asymptomatic. Outpatient follow-up with cardiology.  HLD (hyperlipidemia) - Continued on home regimen of Crestor  Hypothyroidism - Continued on home regimen of synthroid   Atrial fibrillation, chronic (HCC) - CHADS vasc score 4 - Patient maintained on by systolic for rate control and xarelto for anticoagulation. Outpatient follow-up.  Acute on Chronic diastolic CHF (congestive heart failure) (HCC) - During the hospitalization patient was noted to have complaints of shortness of breath. Chest x-ray done was concerning for volume overload/pulmonary edema and probable pneumonia. Patient was placed on IV diuretics. Cardiology consulted. - Given one dose of lasix 40 mg IV on 3/31 - 2-D echo of 06/19/2016 with a EF of 60%, grade 3 diastolic dysfunction and severe TR - Clinical improvement with IV diuresis. Patient noted to have some dizziness yesterday evening on 40 mg of IV Lasix. Patient stated improvement with dizziness however still with some slight dizziness. - Patient was followed by cardiology throughout the hospitalization. Patient underwent strict I's and O's daily weights. Patient's IV diuretics were subsequently transitioned to oral Lasix 40 mg daily per cardiology. Patient improved clinically and was euvolemic by day of discharge. Patient will follow-up with cardiology in the outpatient setting.  Hyponatremia Likely secondary to overdiuresis as patient had some complaints of dizziness yesteday after 40 mg of IV Lasix. IV Lasix has been changed to oral Lasix per cardiology and patient already received her daily dose. Repeat basic metabolic profile on the afternoon of discharge showed improvement with hyponatremia with a sodium level at 131 from 127. Outpatient follow-up.    Procedures:  2-D echo 06/19/2016  X-ray of the right knee 06/21/2016  Chest  x-ray 06/18/2016, 06/19/2016, 06/21/2016  Consultations:  Cardiology: Dr.BENSIMHON 06/19/2016   Discharge Exam: Vitals:   06/24/16 0440 06/24/16 0852  BP: (!) 147/77 134/65  Pulse: 63 62  Resp: 17 13  Temp: 97.5 F (36.4 C)     General: NAD Cardiovascular: RRR with 2-3/6 murmur. Respiratory: CTAB  Discharge Instructions   Discharge Instructions    Diet - low sodium heart healthy    Complete by:  As directed    Increase activity slowly    Complete by:  As directed      Current Discharge Medication List    START taking these medications   Details  acetaminophen (TYLENOL) 325 MG tablet Take 2 tablets (650 mg total) by mouth every 6 (six) hours as needed for mild pain (or Fever >/= 101). Qty: 30 tablet, Refills: 0    calcium carbonate (TUMS - DOSED IN MG ELEMENTAL CALCIUM) 500 MG chewable tablet Chew 1 tablet (200 mg of elemental calcium total) by mouth as needed for indigestion or heartburn.    docusate sodium (COLACE) 100 MG capsule Take 1 capsule (100 mg total) by mouth 2 (two) times daily. Qty: 10 capsule, Refills: 0    guaiFENesin (MUCINEX) 600 MG 12 hr tablet Take 1 tablet (600 mg total) by mouth 2 (two) times daily. Take for 3 days then stop. Qty: 6 tablet, Refills: 0    HYDROcodone-acetaminophen (NORCO/VICODIN) 5-325 MG tablet Take 1-2 tablets by mouth every 6 (six)   hours as needed for moderate pain or severe pain. Qty: 30 tablet, Refills: 0    levofloxacin (LEVAQUIN) 750 MG tablet Take 1 tablet (750 mg total) by mouth every other day. Qty: 1 tablet, Refills: 0      CONTINUE these medications which have CHANGED   Details  traMADol (ULTRAM) 50 MG tablet Take 1 tablet (50 mg total) by mouth every 6 (six) hours as needed. Qty: 30 tablet, Refills: 0      CONTINUE these medications which have NOT CHANGED   Details  amLODipine (NORVASC) 5 MG tablet TAKE 1 TABLET BY MOUTH DAILY Qty: 30 tablet, Refills: 8    BYSTOLIC 5 MG tablet TAKE 1/2 TABLET BY MOUTH  DAILY Qty: 15 tablet, Refills: 9    fish oil-omega-3 fatty acids 1000 MG capsule Take 1 g by mouth daily.     furosemide (LASIX) 40 MG tablet Take 1 tablet (40 mg total) by mouth daily. Qty: 40 tablet, Refills: 3    ibuprofen (ADVIL,MOTRIN) 200 MG tablet Take 400 mg by mouth every 6 (six) hours as needed for moderate pain.    latanoprost (XALATAN) 0.005 % ophthalmic solution Place 1 drop into both eyes at bedtime.      levothyroxine (SYNTHROID, LEVOTHROID) 88 MCG tablet Take 1 mcg by mouth daily.     Multiple Vitamins-Minerals (CENTRUM SILVER PO) Take 1 tablet by mouth daily.      omeprazole (PRILOSEC) 20 MG capsule Take 20 mg by mouth 2 (two) times daily.     potassium chloride SA (K-DUR,KLOR-CON) 20 MEQ tablet Take 1 tablet (20 mEq total) by mouth daily. Qty: 30 tablet, Refills: 3    rosuvastatin (CRESTOR) 20 MG tablet Take 10 mg by mouth 3 (three) times a week. Tue, Thur and Sat    timolol (BETIMOL) 0.5 % ophthalmic solution Place 1 drop into both eyes every morning.     XARELTO 20 MG TABS tablet TAKE 1 TABLET BY MOUTH DAILY WITH SUPPER Qty: 30 tablet, Refills: 5       No Known Allergies  Contact information for follow-up providers    [facility] Follow up on 07/02/2016.   Specialty:  Cardiology Why:  at 1000 am for post hospital follow up. The code for parking is [id number]. Please bring all medications or an up-to-date medication list to your appointment.  Contact information: [address] [id number] [place] [phone]       MD AT SNF Follow up.   Why:  f/u with MD at [facility]       Paralee Cancel D, MD. Schedule an appointment as soon as possible for a visit in 2 week(s).   Specialty:  Orthopedic Surgery Contact information: [address] [place] [address] [phone]            Contact information for after-discharge care    Destination    [facility] Follow up.   Specialty:  [place] information: [place] [phone]                   The results of significant diagnostics from this hospitalization (including imaging, microbiology, ancillary and laboratory) are listed below for reference.    Significant Diagnostic Studies: Dg Chest Port 1 View  Result Date: 06/21/2016 CLINICAL DATA:  Pneumonia EXAM: PORTABLE CHEST 1 VIEW COMPARISON:  06/19/2016 FINDINGS: Cardiac enlargement. Mild vascular congestion has progressed. Mild bilateral  airspace disease also has progressed. Dual lead pacemaker unchanged. No effusion. IMPRESSION: Progression of vascular congestion and mild airspace disease. This may represent fluid overload versus pneumonia. Electronically Signed   By: Charles  Clark M.D.   On: 06/21/2016 07:26   Portable Chest 1 View  Result Date: 06/19/2016 CLINICAL DATA:  Pneumonia EXAM: PORTABLE CHEST 1 VIEW COMPARISON:  06/18/2016 FINDINGS: Cardiac shadow remains enlarged. A pacing device is again seen and stable. Significant improvement in the degree of vascular congestion is noted. There is also significant improvement in aeration in the lungs bilaterally. Some left basilar atelectatic changes are seen as well as mild changes in the right upper lobe. No new focal infiltrate is seen. IMPRESSION: Significant improvement in infiltrates as well as vascular congestion when compared with the prior exam. Electronically Signed   By: Mark  Lukens M.D.   On: 06/19/2016 07:54   Dg Chest Portable 1 View  Result Date: 06/18/2016 CLINICAL DATA:  Shortness of Breath EXAM: PORTABLE CHEST 1 VIEW COMPARISON:  February 06, 2016 FINDINGS: There is patchy airspace opacity throughout much of the right upper lobe. Lungs elsewhere are clear. There is cardiomegaly with pulmonary venous hypertension. Pacemaker leads are attached to the right atrium and right ventricle. There  is atherosclerotic calcification in the aorta. No adenopathy. No bone lesions. IMPRESSION: Right upper lobe airspace opacity, likely pneumonia. Lungs elsewhere clear. There is pulmonary vascular congestion which appear stable. Pacemaker leads unchanged. There is aortic atherosclerosis. Followup PA and lateral chest radiographs recommended in 3-4 weeks following trial of antibiotic therapy to ensure resolution and exclude underlying malignancy. Electronically Signed   By: William  Woodruff III M.D.   On: 06/18/2016 10:52   Dg Knee Complete 4 Views Right  Result Date: 06/17/2016 CLINICAL DATA:  Right knee pain and swelling without known injury. EXAM: RIGHT KNEE - COMPLETE 4+ VIEW COMPARISON:  None. FINDINGS: No fracture or dislocation is noted. Moderate suprapatellar joint effusion is noted. Vascular calcifications are noted. No significant joint space narrowing is noted. Chondrocalcinosis is noted laterally. IMPRESSION: Moderate suprapatellar joint effusion. No other acute abnormality seen in the right knee. Electronically Signed   By: James  Green Jr, M.D.   On: 06/17/2016 15:14   Dg Knee Right Port  Result Date: 06/21/2016 CLINICAL DATA:  Effusion into joint Generalized pain X 4 days.   If EXAM: PORTABLE RIGHT KNEE - 1-2 VIEW COMPARISON:  06/17/2016 FINDINGS: Moderate effusion in the suprapatellar bursa. Possible free osteochondral body superolateral to the patella. Small marginal spurs from the patellar articular surface and tibial plateau. Chondrocalcinosis in medial and lateral compartments. Negative for fracture or dislocation. Patchy femoral-popliteal arterial calcifications. IMPRESSION: 1. Tricompartmental degenerative change with moderate effusion, possible free osteochondral body. 2. Chondrocalcinosis suggesting CPPD. 3. Arterial atherosclerosis Electronically Signed   By: D  Hassell M.D.   On: 06/21/2016 10:23    Microbiology: Recent Results (from the past 240 hour(s))  Body fluid culture      Status: None   Collection Time: 06/17/16  6:05 PM  Result Value Ref Range Status   Specimen Description KNEE RIGHT  Final   Special Requests NONE  Final   Gram Stain   Final    NO ORGANISMS SEEN ABUNDANT WBC PRESENT, PREDOMINANTLY PMN Gram Stain Report Called to,Read Back By and Verified With: K.MINGIA RN AT 1851 ON 06/17/16 BY S.VANHOORNE    Culture   Final    NO GROWTH 3 DAYS Performed at [facility], [address] [place], [place] [address]      Report Status 06/21/2016 FINAL  Final  Blood Culture (routine x 2)     Status: None   Collection Time: 06/18/16 11:30 AM  Result Value Ref Range Status   Specimen Description BLOOD RIGHT HAND  Final   Special Requests BOTTLES DRAWN AEROBIC AND ANAEROBIC  5CC  Final   Culture NO GROWTH 5 DAYS  Final   Report Status 06/23/2016 FINAL  Final  Blood Culture (routine x 2)     Status: None   Collection Time: 06/18/16 11:35 AM  Result Value Ref Range Status   Specimen Description BLOOD RIGHT HAND  Final   Special Requests BOTTLES DRAWN AEROBIC AND ANAEROBIC  5CC  Final   Culture NO GROWTH 5 DAYS  Final   Report Status 06/23/2016 FINAL  Final  Culture, Urine     Status: Abnormal (Preliminary result)   Collection Time: 06/18/16 12:38 PM  Result Value Ref Range Status   Specimen Description URINE, RANDOM  Final   Special Requests NONE  Final   Culture (A)  Final    >=100,000 COLONIES/mL ENTEROCOCCUS FAECALIS Sent to [facility] for further susceptibility testing. RESULT CALLED TO, READ BACK BY AND VERIFIED WITH: J. TOWNES, RN AT 0945 ON 06/22/16 BY C. JESSUP, MLT.    Report Status PENDING  Incomplete  Susceptibility, Aer + Anaerob     Status: None   Collection Time: 06/18/16 12:38 PM  Result Value Ref Range Status   Suscept, Aer + Anaerob Preliminary report  Final    Comment: (NOTE) Performed At: [facility] [address] [place], [place] [id number] [place] William F MD Ph:[phone]    Source of Sample URINE, RANDOM  Final     Comment: E. FAECIUM  Susceptibility Result     Status: None (Preliminary result)   Collection Time: 06/18/16 12:38 PM  Result Value Ref Range Status   Suscept Result 1 Enterococcus faecium  Final    Comment: (NOTE) Identification performed by account, not confirmed by this laboratory. Performed At: [facility] [address] [place], [place] [id number] [place] William F MD Ph:[phone]    Antimicrobial Suscept PENDING  Incomplete  MRSA PCR Screening     Status: None   Collection Time: 06/18/16  4:54 PM  Result Value Ref Range Status   MRSA by PCR NEGATIVE NEGATIVE Final    Comment:        The GeneXpert MRSA Assay (FDA approved for NASAL specimens only), is one component of a comprehensive MRSA colonization surveillance program. It is not intended to diagnose MRSA infection nor to guide or monitor treatment for MRSA infections.      Labs: Basic Metabolic Panel:  Recent Labs Lab 06/18/16 1523  06/21/16 0416 06/22/16 0313 06/23/16 0415 06/24/16 0507 06/24/16 1328  NA  --   < > 130* 132* 133* 127* 131*  K  --   < > 4.3 3.9 4.3 4.6 4.7  CL  --   < > 97* 98* 97* 94* 100*  CO2  --   < > 24 27 26 24 22  GLUCOSE  --   < > 141* 137* 114* 130* 144*  BUN  --   < > 17 9 12 13 13  CREATININE  --   < > 0.80 0.66 0.88 0.89 0.84  CALCIUM  --   < > 8.6* 8.4* 8.7* 9.0 8.6*  MG 1.8  --   --   --  1.8  --   --   PHOS 3.6  --   --   --   --   --   --   < > =   values in this interval not displayed. Liver Function Tests:  Recent Labs Lab 06/18/16 1121  AST 43*  ALT 18  ALKPHOS 104  BILITOT 1.3*  PROT 6.9  ALBUMIN 3.6   No results for input(s): LIPASE, AMYLASE in the last 168 hours. No results for input(s): AMMONIA in the last 168 hours. CBC:  Recent Labs Lab 06/18/16 1121  06/19/16 0407 06/20/16 0323 06/22/16 0313 06/23/16 0415 06/24/16 0507  WBC 12.2*  --  14.3* 11.7* 7.4 8.7 10.9*  NEUTROABS 11.4*  --   --  9.5*  --   --  8.1*  HGB 10.9*  < > 9.1*  9.0* 9.1* 9.4* 10.3*  HCT 34.0*  < > 29.3* 28.3* 28.5* 29.5* 32.4*  MCV 77.8*  --  77.3* 77.1* 77.2* 77.4* 76.6*  PLT 220  --  164 166 187 229 272  < > = values in this interval not displayed. Cardiac Enzymes:  Recent Labs Lab 06/18/16 1523 06/18/16 1757 06/19/16 1449  TROPONINI 0.20* 0.21* 0.07*   BNP: BNP (last 3 results)  Recent Labs  03/11/16 1241  BNP 570.5*    ProBNP (last 3 results) No results for input(s): PROBNP in the last 8760 hours.  CBG: No results for input(s): GLUCAP in the last 168 hours.     Signed:  , MD.  [facility] 06/24/2016, 4:35 PM   

## 2016-06-24 NOTE — Progress Notes (Signed)
Report was called to receiving RN at [facility]. [facility] at bedside to transport patient. Patient's daughter-in-law at bedside is aware.

## 2016-06-24 NOTE — Progress Notes (Signed)
qPhysical Therapy Treatment Patient Details Name: Beth Santiago MRN: [id number] DOB: [birth year] Today's Date: 06/24/2016    History of Present Illness Patient is a 83 y/o female who presents with right knee pain/swelling and SOB. Knee was tapped in ED. CXR showed right upper lobe airspace opacity, likely pneumonia. UA showed small leukocytes. Admitted with sepsis secondary to PNA and UTI.     PT Comments    Pt performed increased mobility during session.  Pt remains to require short term SNF placement at d/c.  Continues to fatigue quickly but motivated to improve.     Follow Up Recommendations  SNF     Equipment Recommendations  None recommended by PT    Recommendations for Other Services       Precautions / Restrictions Precautions Precautions: Fall Restrictions Weight Bearing Restrictions: No    Mobility  Bed Mobility               General bed mobility comments: Sitting up in recliner upon arrival.  Transfers Overall transfer level: Needs assistance Equipment used: Rolling walker (2 wheeled) Transfers: Sit to/from Stand Sit to Stand: Min guard         General transfer comment: Cues for hand placement to and from seated surface for safety with transfer.  Slow to ascend.    Ambulation/Gait Ambulation/Gait assistance: Min assist Ambulation Distance (Feet): 70 Feet Assistive device: Rolling walker (2 wheeled) Gait Pattern/deviations: Step-through pattern;Trunk flexed;Decreased stride length Gait velocity: decreased Gait velocity interpretation: Below normal speed for age/gender General Gait Details: Cues for upper trunk control, increasing B stride length, and maintain close proximity to RW.  Pt fatigues quickly with mobility.     Stairs            Wheelchair Mobility    Modified Rankin (Stroke Patients Only)       Balance Overall balance assessment: Needs assistance   Sitting balance-Leahy Scale: Good       Standing balance-Leahy  Scale: Poor                              Cognition Arousal/Alertness: Awake/alert Behavior During Therapy: WFL for tasks assessed/performed Overall Cognitive Status: History of cognitive impairments - at baseline                                 General Comments: repetitive questions      Exercises Other Exercises Other Exercises: Incentive Spirometer 1x10 reps, quality: -    General Comments        Pertinent Vitals/Pain Pain Assessment: No/denies pain Faces Pain Scale: Hurts little more Pain Location: right knee with standing Pain Descriptors / Indicators: Sore Pain Intervention(s): Monitored during session;Repositioned    Home Living                      Prior Function            PT Goals (current goals can now be found in the care plan section) Acute Rehab PT Goals Patient Stated Goal: to get stronger and be able to walk Potential to Achieve Goals: Good Progress towards PT goals: Progressing toward goals    Frequency    Min 3X/week      PT Plan Current plan remains appropriate;Frequency needs to be updated    Co-evaluation  End of Session Equipment Utilized During Treatment: Gait belt Activity Tolerance: Patient tolerated treatment well;Patient limited by fatigue Patient left: in chair;with call bell/phone within reach;with family/visitor present Nurse Communication: Mobility status PT Visit Diagnosis: Difficulty in walking, not elsewhere classified (R26.2) Pain - Right/Left: Right Pain - part of body: Knee     Time: 1151-1207 PT Time Calculation (min) (ACUTE ONLY): 16 min  Charges:  $Gait Training: 8-22 mins                    G Codes:       Joycelyn Rua, PTA pager [phone]    Florestine Avers 06/24/2016, 12:10 PM

## 2016-06-24 NOTE — Progress Notes (Signed)
Clinical Social Worker facilitated patient discharge including contacting patient family and facility to confirm patient discharge plans.  Clinical information faxed to facility and family agreeable with plan.  CSW arranged ambulance transport via [facility] to [facility] .  RN to call [phone] (pt will be going to room 103A) report prior to discharge.  Clinical Social Worker will sign off for now as social work intervention is no longer needed. Please consult [place] again if new need arises.  Marrianne Mood, MSW, [employer] [phone]

## 2016-06-27 LAB — SUSCEPTIBILITY RESULT

## 2016-06-27 LAB — SUSCEPTIBILITY, AER + ANAEROB

## 2016-06-28 LAB — URINE CULTURE

## 2016-07-01 ENCOUNTER — Ambulatory Visit (HOSPITAL_COMMUNITY)
Admission: RE | Admit: 2016-07-01 | Discharge: 2016-07-01 | Disposition: A | Discharge status: Home / Self Care | Payer: Medicare Other | Source: Ambulatory Visit | Attending: Cardiology | Admitting: Cardiology

## 2016-07-01 ENCOUNTER — Encounter: Payer: Medicare Other | Admitting: Internal Medicine

## 2016-07-01 VITALS — BP 120/86 | HR 65 | Wt 173.5 lb

## 2016-07-01 DIAGNOSIS — Z7901 Long term (current) use of anticoagulants: Secondary | ICD-10-CM | POA: Insufficient documentation

## 2016-07-01 DIAGNOSIS — I482 Chronic atrial fibrillation: Secondary | ICD-10-CM | POA: Diagnosis not present

## 2016-07-01 DIAGNOSIS — I2510 Atherosclerotic heart disease of native coronary artery without angina pectoris: Secondary | ICD-10-CM | POA: Diagnosis not present

## 2016-07-01 DIAGNOSIS — J189 Pneumonia, unspecified organism: Secondary | ICD-10-CM | POA: Diagnosis not present

## 2016-07-01 DIAGNOSIS — K219 Gastro-esophageal reflux disease without esophagitis: Secondary | ICD-10-CM | POA: Diagnosis not present

## 2016-07-01 DIAGNOSIS — I110 Hypertensive heart disease with heart failure: Secondary | ICD-10-CM | POA: Diagnosis not present

## 2016-07-01 DIAGNOSIS — I10 Essential (primary) hypertension: Secondary | ICD-10-CM | POA: Diagnosis not present

## 2016-07-01 DIAGNOSIS — Z950 Presence of cardiac pacemaker: Secondary | ICD-10-CM | POA: Insufficient documentation

## 2016-07-01 DIAGNOSIS — I5032 Chronic diastolic (congestive) heart failure: Secondary | ICD-10-CM | POA: Insufficient documentation

## 2016-07-01 DIAGNOSIS — E785 Hyperlipidemia, unspecified: Secondary | ICD-10-CM | POA: Diagnosis not present

## 2016-07-01 DIAGNOSIS — I4821 Permanent atrial fibrillation: Secondary | ICD-10-CM

## 2016-07-01 DIAGNOSIS — I4892 Unspecified atrial flutter: Secondary | ICD-10-CM | POA: Insufficient documentation

## 2016-07-01 DIAGNOSIS — M1990 Unspecified osteoarthritis, unspecified site: Secondary | ICD-10-CM | POA: Diagnosis not present

## 2016-07-01 DIAGNOSIS — E039 Hypothyroidism, unspecified: Secondary | ICD-10-CM | POA: Insufficient documentation

## 2016-07-01 DIAGNOSIS — I481 Persistent atrial fibrillation: Secondary | ICD-10-CM | POA: Diagnosis not present

## 2016-07-01 NOTE — Progress Notes (Signed)
PCP: Dr Evlyn Kanner  Primary HF Cardiologist: Dr Gala Romney  HPI: Ms Windle is an 83 year old with a history of , HTN, hyperlipidemia, permanent a fib, CHB PPM, nonobstructive CAD cath 2007, and chronic diastolic heart failure.   Admitted the March 28 th with R knee pain and dyspnea. Treated for pneumonia and heart failure. Diuresed with IV lasix and transitioned to po lasix 40 mg daily. Discharge weight was 181 pounds. Discharged to [facility].   Today she returns for post hospital HF follow up. Overall feeling ok. Denies SOB/PND. Sleeps with HOB elevated. She is not sure what her weight has been. Participating in therapy at [facility]. Limited mobility due to knee pain. All medications provided at that facility.    ECHO 06/19/2016: Ef 60-65%.  ROS: All systems negative except as listed in HPI, PMH and Problem List.  SH:  Social History   Social History  . Marital status: Widowed    Spouse name: N/A  . Number of children: N/A  . Years of education: N/A   Occupational History  . Not on file.   Social History Main Topics  . Smoking status: Never Smoker  . Smokeless tobacco: Never Used  . Alcohol use Yes     Comment: 05/2016    OCCASIONAL   . Drug use: No  . Sexual activity: Not on file   Other Topics Concern  . Not on file   Social History Narrative  . No narrative on file    FH:  Family History  Problem Relation Age of Onset  . Cancer Mother   . Diabetes Mother   . Hypertension Mother   . Heart disease Father   . Cancer Brother   . Heart disease Brother     Past Medical History:  Diagnosis Date  . Atrial flutter (HCC)    a. Noted during pacemaker implantation 2011, spontaneously terminated.  . Biatrial enlargement    severe  . CAD (coronary artery disease)    a. Nonobstructive by cath 2005 - 40-50% mid LAD. b. Nonischemic nuc 05/2011.  . Carotid artery disease (HCC)    a. 1-50% bilateral (upper end) - March 2011.  Marland Kitchen CHF (congestive heart  failure) (HCC)   . DJD (degenerative joint disease)   . GERD (gastroesophageal reflux disease)   . Hemoptysis 05/2016  . Hyperlipidemia   . Hypertension   . Hypothyroid   . Persistent atrial fibrillation (HCC) 05/2011  . Second degree heart block    a. s/p PPM 2005. b. Gen change to Medtronic by Dr Johney Frame  9/11 for premature ERI 11/2009.    Current Outpatient Prescriptions  Medication Sig Dispense Refill  . acetaminophen (TYLENOL) 325 MG tablet Take 2 tablets (650 mg total) by mouth every 6 (six) hours as needed for mild pain (or Fever >/= 101). 30 tablet 0  . amLODipine (NORVASC) 5 MG tablet TAKE 1 TABLET BY MOUTH DAILY 30 tablet 8  . BYSTOLIC 5 MG tablet TAKE 1/2 TABLET BY MOUTH DAILY 15 tablet 9  . calcium carbonate (TUMS - DOSED IN MG ELEMENTAL CALCIUM) 500 MG chewable tablet Chew 1 tablet (200 mg of elemental calcium total) by mouth as needed for indigestion or heartburn.    . docusate sodium (COLACE) 100 MG capsule Take 1 capsule (100 mg total) by mouth 2 (two) times daily. 10 capsule 0  . fish oil-omega-3 fatty acids 1000 MG capsule Take 1 g by mouth daily.     . furosemide (LASIX) 40  MG tablet Take 1 tablet (40 mg total) by mouth daily. 40 tablet 3  . HYDROcodone-acetaminophen (NORCO/VICODIN) 5-325 MG tablet Take 1-2 tablets by mouth every 6 (six) hours as needed for moderate pain or severe pain. 30 tablet 0  . latanoprost (XALATAN) 0.005 % ophthalmic solution Place 1 drop into both eyes at bedtime.      Marland Kitchen levothyroxine (SYNTHROID, LEVOTHROID) 88 MCG tablet Take 1 mcg by mouth daily.     . Multiple Vitamins-Minerals (CENTRUM SILVER PO) Take 1 tablet by mouth daily.      Marland Kitchen omeprazole (PRILOSEC) 20 MG capsule Take 20 mg by mouth 2 (two) times daily.     . potassium chloride SA (K-DUR,KLOR-CON) 20 MEQ tablet Take 1 tablet (20 mEq total) by mouth daily. 30 tablet 3  . rosuvastatin (CRESTOR) 20 MG tablet Take 10 mg by mouth 3 (three) times a week. Tue, Thur and Sat    . timolol (BETIMOL)  0.5 % ophthalmic solution Place 1 drop into both eyes every morning.     . traMADol (ULTRAM) 50 MG tablet Take 1 tablet (50 mg total) by mouth every 6 (six) hours as needed. 30 tablet 0  . XARELTO 20 MG TABS tablet TAKE 1 TABLET BY MOUTH DAILY WITH SUPPER 30 tablet 5  . ibuprofen (ADVIL,MOTRIN) 200 MG tablet Take 400 mg by mouth every 6 (six) hours as needed for moderate pain.     No current facility-administered medications for this encounter.     Vitals:   07/01/16 1436  BP: 120/86  Pulse: 65  SpO2: 97%  Weight: 173 lb 8 oz (78.7 kg)    PHYSICAL EXAM: General:  Elderly arrived in a wheel chair. Daughter present. No resp difficulty HEENT: normal Neck: supple. JVP 6-7 CV waves. Carotids 2+ bilaterally; no bruits. No lymphadenopathy or thryomegaly appreciated. Cor: PMI normal. Irregular rate & rhythm. No rubs, gallops or murmurs. Lungs: CTAB. No wheeze. On room air.  Abdomen: soft, nontender, nondistended. No hepatosplenomegaly. No bruits or masses. Good bowel sounds. Extremities: no cyanosis, clubbing, rash, edema Neuro: alert & orientedx3, cranial nerves grossly intact. Moves all 4 extremities w/o difficulty. Affect pleasant.  ASSESSMENT & PLAN: 1. Chronic Diastolic Heart Failure ECHO 05/2016 EF 55-60%. Appears stable today.  Volume status stable. Continue lasix 40 mg daily with an extra 20 mg for weight 180  Pounds or greater.  Provided instructions for SNF to weight and record twice a week Also recommended low salt diet.  2. Permanent A fib-  Controlled rate. On xarelto. No bleeding problems.  3. HTN- stable. Continue current regimen.  4. CAP- completing antibiotics for pneumonia.   Follow up in 2 months to reassess volume status. Check BMEt at that time.  Tiamarie Furnari 3:21 PM

## 2016-07-01 NOTE — Patient Instructions (Signed)
Your physician recommends that you schedule a follow-up appointment in: 2 months    Do the following things EVERYDAY: 1) Weigh yourself in the morning before breakfast. Write it down and keep it in a log. 2) Take your medicines as prescribed 3) Eat low salt foods-Limit salt (sodium) to 2000 mg per day.  4) Stay as active as you can everyday 5) Limit all fluids for the day to less than 2 liters   

## 2016-07-02 ENCOUNTER — Encounter (HOSPITAL_COMMUNITY): Payer: Medicare Other

## 2016-07-14 ENCOUNTER — Encounter: Payer: Medicare Other | Admitting: Internal Medicine

## 2016-08-03 ENCOUNTER — Ambulatory Visit (INDEPENDENT_AMBULATORY_CARE_PROVIDER_SITE_OTHER): Payer: Medicare Other | Admitting: Internal Medicine

## 2016-08-03 ENCOUNTER — Encounter (INDEPENDENT_AMBULATORY_CARE_PROVIDER_SITE_OTHER): Payer: Self-pay

## 2016-08-03 ENCOUNTER — Encounter: Payer: Self-pay | Admitting: Internal Medicine

## 2016-08-03 VITALS — BP 120/64 | HR 62 | Ht 66.0 in | Wt 165.0 lb

## 2016-08-03 DIAGNOSIS — I10 Essential (primary) hypertension: Secondary | ICD-10-CM | POA: Diagnosis not present

## 2016-08-03 DIAGNOSIS — I4821 Permanent atrial fibrillation: Secondary | ICD-10-CM

## 2016-08-03 DIAGNOSIS — I482 Chronic atrial fibrillation: Secondary | ICD-10-CM

## 2016-08-03 DIAGNOSIS — I442 Atrioventricular block, complete: Secondary | ICD-10-CM

## 2016-08-03 LAB — CUP PACEART INCLINIC DEVICE CHECK
Battery Remaining Longevity: 59 mo
Battery Voltage: 2.77 V
Brady Statistic RV Percent Paced: 100 %
Date Time Interrogation Session: 20180514133058
Implantable Lead Implant Date: 20050927
Implantable Lead Implant Date: 20050927
Implantable Lead Location: 753859
Implantable Lead Model: 5076
Implantable Lead Model: 5076
Lead Channel Impedance Value: 467 Ohm
Lead Channel Impedance Value: 467 Ohm
Lead Channel Pacing Threshold Amplitude: 1 V
Lead Channel Pacing Threshold Amplitude: 1 V
Lead Channel Pacing Threshold Pulse Width: 0.4 ms
Lead Channel Pacing Threshold Pulse Width: 0.4 ms
Lead Channel Setting Pacing Amplitude: 2.5 V
MDC IDC LEAD LOCATION: 753860
MDC IDC MSMT BATTERY IMPEDANCE: 1148 Ohm
MDC IDC MSMT LEADCHNL RA IMPEDANCE VALUE: 67 Ohm
MDC IDC MSMT LEADCHNL RA IMPEDANCE VALUE: 67 Ohm
MDC IDC MSMT LEADCHNL RV PACING THRESHOLD AMPLITUDE: 1.25 V
MDC IDC MSMT LEADCHNL RV PACING THRESHOLD AMPLITUDE: 1.25 V
MDC IDC MSMT LEADCHNL RV PACING THRESHOLD PULSEWIDTH: 0.4 ms
MDC IDC MSMT LEADCHNL RV PACING THRESHOLD PULSEWIDTH: 0.4 ms
MDC IDC PG IMPLANT DT: 20110921
MDC IDC SET LEADCHNL RV PACING PULSEWIDTH: 0.4 ms
MDC IDC SET LEADCHNL RV SENSING SENSITIVITY: 2.8 mV

## 2016-08-03 NOTE — Patient Instructions (Signed)
Medication Instructions:  Your physician recommends that you continue on your current medications as directed. Please refer to the Current Medication list given to you today.  Follow-Up: Remote monitoring is used to monitor your Pacemaker from home. This monitoring reduces the number of office visits required to check your device to one time per year. It allows us to keep an eye on the functioning of your device to ensure it is working properly. You are scheduled for a device check from home on 11/02/16. You may send your transmission at any time that day. If you have a wireless device, the transmission will be sent automatically. After your physician reviews your transmission, you will receive a postcard with your next transmission date.  Your physician wants you to follow-up in: 1 year with Dr. Johney FrameAllred.  You will receive a reminder letter in the mail two months in advance. If you don't receive a letter, please call our office to schedule the follow-up appointment.   Any Other Special Instructions Will Be Listed Below (If Applicable).     If you need a refill on your cardiac medications before your next appointment, please call your pharmacy.

## 2016-08-03 NOTE — Progress Notes (Signed)
PCP: Beth Santiago, [name], MD Primary Cardiologist:  Dr Beth Santiago  Beth Santiago is a 83 y.o. female who presents today for routine electrophysiology followup.  She has recently hospitalized with pneumonia, CHF, and UTI.  Blood cultures from that admit are reviewed today and were negative.  She was quite sick.  Fortunately, she has made very good progress!  She is near her baseline.  Mostly limited by DJD and walks slowly with a cane.  Today, she denies symptoms of palpitations, chest pain, shortness of breath,  lower extremity edema, dizziness, presyncope, or syncope.  The patient is otherwise without complaint today.   Past Medical History:  Diagnosis Date  . Atrial flutter (HCC)    a. Noted during pacemaker implantation 2011, spontaneously terminated.  . Biatrial enlargement    severe  . CAD (coronary artery disease)    a. Nonobstructive by cath 2005 - 40-50% mid LAD. b. Nonischemic nuc 05/2011.  . Carotid artery disease (HCC)    a. 1-50% bilateral (upper end) - March 2011.  Marland Kitchen. CHF (congestive heart failure) (HCC)   . DJD (degenerative joint disease)   . GERD (gastroesophageal reflux disease)   . Hemoptysis 05/2016  . Hyperlipidemia   . Hypertension   . Hypothyroid   . Persistent atrial fibrillation (HCC) 05/2011  . Second degree heart block    a. s/p PPM 2005. b. Gen change to Medtronic by Dr Beth Santiago  9/11 for premature ERI 11/2009.   Past Surgical History:  Procedure Laterality Date  . ABDOMINAL SURGERY     twisted bowel  . CARDIOVERSION  06/11/2011   Procedure: CARDIOVERSION;  Surgeon: Beth Pattyaniel R Bensimhon, MD;  [facility];  Service: Cardiovascular;  Laterality: N/A;  . CARDIOVERSION N/A 01/04/2013   Procedure: CARDIOVERSION;  Surgeon: Beth MassonKatarina H Nelson, MD;  [facility];  Service: Cardiovascular;  Laterality: N/A;  . COLON SURGERY    . HEMORROIDECTOMY    . PACEMAKER INSERTION  2005, 12/11/09   implanted by Dr Beth Santiago, generator change 9/11 by Beth KirkJA for  premature ERI  . TEE WITHOUT CARDIOVERSION  06/11/2011   Procedure: TRANSESOPHAGEAL ECHOCARDIOGRAM (TEE);  Surgeon: Beth Pattyaniel R Bensimhon, MD;  [facility];  Service: Cardiovascular;  Laterality: N/A;  . TONSILLECTOMY      ROS- all systems are reviewed and negative except as per HPI above  Current Outpatient Prescriptions  Medication Sig Dispense Refill  . acetaminophen (TYLENOL) 325 MG tablet Take 2 tablets (650 mg total) by mouth every 6 (six) hours as needed for mild pain (or Fever >/= 101). 30 tablet 0  . amLODipine (NORVASC) 5 MG tablet TAKE 1 TABLET BY MOUTH DAILY 30 tablet 8  . BYSTOLIC 5 MG tablet TAKE 1/2 TABLET BY MOUTH DAILY 15 tablet 9  . calcium carbonate (TUMS - DOSED IN MG ELEMENTAL CALCIUM) 500 MG chewable tablet Chew 1 tablet (200 mg of elemental calcium total) by mouth as needed for indigestion or heartburn.    . fish oil-omega-3 fatty acids 1000 MG capsule Take 1 g by mouth daily.     . furosemide (LASIX) 40 MG tablet Take 1 tablet (40 mg total) by mouth daily. 40 tablet 3  . latanoprost (XALATAN) 0.005 % ophthalmic solution Place 1 drop into both eyes at bedtime.      Marland Kitchen. levothyroxine (SYNTHROID, LEVOTHROID) 88 MCG tablet Take 1 mcg by mouth daily.     . Multiple Vitamins-Minerals (CENTRUM SILVER PO) Take 1 tablet by mouth daily.      . NON FORMULARY Take  1 tablet by mouth daily. Zyflamend - Supplement    . omeprazole (PRILOSEC) 20 MG capsule Take 20 mg by mouth as directed. Take once or twice a day    . potassium chloride SA (K-DUR,KLOR-CON) 20 MEQ tablet Take 1 tablet (20 mEq total) by mouth daily. 30 tablet 3  . rosuvastatin (CRESTOR) 20 MG tablet Take 10 mg by mouth 3 (three) times a week. Tue, Thur and Sat    . timolol (BETIMOL) 0.5 % ophthalmic solution Place 1 drop into both eyes every morning.     Beth Santiago 20 MG TABS tablet TAKE 1 TABLET BY MOUTH DAILY WITH SUPPER 30 tablet 5   No current facility-administered medications for this visit.     Physical  Exam: Vitals:   08/03/16 1057  BP: 120/64  Pulse: 62  SpO2: 98%  Weight: 165 lb (74.8 kg)  Height: 5\' 6"  (1.676 m)    GEN- The patient is pleasant but elderly appearing, alert and oriented x 3 today.   Head- normocephalic, atraumatic Eyes-  Sclera clear, conjunctiva pink Ears- hearing intact Oropharynx- clear Lungs- Clear to ausculation bilaterally, normal work of breathing Chest- pacemaker pocket is well healed Heart- Regular rate and rhythm (paced), 3/6 SEM LLSB GI- soft, NT, ND, + BS Extremities- no clubbing, cyanosis, or edema MS- walks slowly with a cane  Pacemaker interrogation- personally reviewed in detail today,  See PACEART report  Assessment and Plan:  1. Complete heart block Normal pacemaker function See Pace Art report No changes today  2. Permanent afib Rate controlled Anticoagulated with xarelto  3. HTN Stable No change required today  carelink Return to see me in a year unless problems arise in the interim.  [name] MD, [facility] 08/03/2016 11:28 AM

## 2016-08-05 ENCOUNTER — Telehealth (HOSPITAL_COMMUNITY): Payer: Self-pay | Admitting: Cardiology

## 2016-08-05 ENCOUNTER — Encounter (HOSPITAL_COMMUNITY): Payer: Medicare Other | Admitting: Internal Medicine

## 2016-08-05 NOTE — Telephone Encounter (Signed)
Patients daughter called with concerns about increased weight. Patients [facility] to inform that she gave patient two doses of Lasix 40.  Daughter reports weight was up x 4 lbs over night. Was told in the past to give second dose of diuretic.    patient is not symptomatic, no swelling, no SOB patient has chronic fatigue.  Weight normally 155-156  weight 5/15 160  Please note patient was admitted for pneumonia and she lost a lot of weight during admission .  Patient scheduled for follow up 08/2016

## 2016-08-05 NOTE — Telephone Encounter (Signed)
pts daughter aware 

## 2016-08-05 NOTE — Telephone Encounter (Signed)
Two extra doses of lasix 40?  She is supposed to be on 40 mg daily.  She is OK to take extra as needed.     Casimiro NeedleMichael [address]Andy" Kistlerillery, PA-C 08/05/2016 9:57 AM

## 2016-08-11 ENCOUNTER — Telehealth: Payer: Self-pay | Admitting: Internal Medicine

## 2016-08-11 NOTE — Telephone Encounter (Signed)
[facility] she states pt had an episode of dizziness a few days ago when she was working w/therapy but none since, she states pt takes her time and gets up slowly and has no dizziness with this.  Advised to continue to monitor and let us know pt is symptomatic, BP readings are in ok range as long as pt is asymptomatic, she is in agreement and will continue to monitor.  Pt is sch for f/u appt in early June.

## 2016-08-11 NOTE — Telephone Encounter (Signed)
New Message:    Please call,she needs to give you an update on thr pt's condition.

## 2016-08-11 NOTE — Telephone Encounter (Signed)
Home health nurse is calling concerning patient's BP medications and needing parameters. Patient is on Bystolic 5 mg, Amlodipine 5 mg and Furosemide 40 mg. Patient's BP before morning medications was 118/60 and HR 62. After medications patient's SBP was 104. Will forward to Dr. Gala RomneyBensimhon patient's primary cardiologist.

## 2016-08-13 ENCOUNTER — Other Ambulatory Visit: Payer: Self-pay | Admitting: Internal Medicine

## 2016-08-14 ENCOUNTER — Other Ambulatory Visit: Payer: Self-pay | Admitting: *Deleted

## 2016-08-14 MED ORDER — FUROSEMIDE 40 MG PO TABS: 40.0000 mg | ORAL_TABLET | Freq: Every day | ORAL | 3 refills | Status: DC

## 2016-09-03 ENCOUNTER — Encounter (HOSPITAL_COMMUNITY): Payer: Medicare Other | Admitting: Internal Medicine

## 2016-09-08 ENCOUNTER — Encounter (HOSPITAL_COMMUNITY): Payer: Self-pay | Admitting: Internal Medicine

## 2016-09-08 ENCOUNTER — Ambulatory Visit (HOSPITAL_COMMUNITY)
Admission: RE | Admit: 2016-09-08 | Discharge: 2016-09-08 | Disposition: A | Discharge status: Home / Self Care | Payer: Medicare Other | Source: Ambulatory Visit | Attending: Internal Medicine | Admitting: Internal Medicine

## 2016-09-08 VITALS — BP 134/78 | HR 78 | Wt 165.5 lb

## 2016-09-08 DIAGNOSIS — M1990 Unspecified osteoarthritis, unspecified site: Secondary | ICD-10-CM | POA: Diagnosis not present

## 2016-09-08 DIAGNOSIS — I110 Hypertensive heart disease with heart failure: Secondary | ICD-10-CM | POA: Insufficient documentation

## 2016-09-08 DIAGNOSIS — E785 Hyperlipidemia, unspecified: Secondary | ICD-10-CM | POA: Diagnosis not present

## 2016-09-08 DIAGNOSIS — I4821 Permanent atrial fibrillation: Secondary | ICD-10-CM

## 2016-09-08 DIAGNOSIS — E039 Hypothyroidism, unspecified: Secondary | ICD-10-CM | POA: Diagnosis not present

## 2016-09-08 DIAGNOSIS — I2510 Atherosclerotic heart disease of native coronary artery without angina pectoris: Secondary | ICD-10-CM | POA: Diagnosis not present

## 2016-09-08 DIAGNOSIS — I10 Essential (primary) hypertension: Secondary | ICD-10-CM | POA: Diagnosis not present

## 2016-09-08 DIAGNOSIS — I5032 Chronic diastolic (congestive) heart failure: Secondary | ICD-10-CM | POA: Diagnosis not present

## 2016-09-08 DIAGNOSIS — K219 Gastro-esophageal reflux disease without esophagitis: Secondary | ICD-10-CM | POA: Diagnosis not present

## 2016-09-08 DIAGNOSIS — Z7901 Long term (current) use of anticoagulants: Secondary | ICD-10-CM | POA: Insufficient documentation

## 2016-09-08 DIAGNOSIS — I482 Chronic atrial fibrillation: Secondary | ICD-10-CM | POA: Diagnosis not present

## 2016-09-08 LAB — BASIC METABOLIC PANEL
Anion gap: 8 (ref 5–15)
BUN: 11 mg/dL (ref 6–20)
CALCIUM: 9.2 mg/dL (ref 8.9–10.3)
CO2: 22 mmol/L (ref 22–32)
CREATININE: 0.68 mg/dL (ref 0.44–1.00)
Chloride: 103 mmol/L (ref 101–111)
GFR calc Af Amer: >60 mL/min (ref >60)
GFR calc non Af Amer: >60 mL/min (ref >60)
Glucose, Bld: 154 mg/dL — ABNORMAL HIGH (ref 65–99)
Potassium: 3.7 mmol/L (ref 3.5–5.1)
SODIUM: 133 mmol/L — AB (ref 135–145)

## 2016-09-08 NOTE — Patient Instructions (Signed)
Routine lab work today. Will notify you of abnormal results  Follow up in 3-4 months with Dr.Bensimhon.

## 2016-09-08 NOTE — Addendum Note (Signed)
Encounter addended by: Modesta MessingBrown, Bobbi Yount Q, CMA on: 09/08/2016 11:56 AM<BR>    Actions taken: Diagnosis association updated, Order list changed, Sign clinical note

## 2016-09-08 NOTE — Progress Notes (Signed)
ADVANCED HF CLINIC NOTE  PCP: Dr Evlyn KannerSouth  Primary HF Cardiologist: Dr Gala RomneyBensimhon  HPI: Ms Beth Santiago is an 83 year old with a history of , HTN, hyperlipidemia, permanent a fib, CHB PPM, nonobstructive CAD cath 2007, and chronic diastolic heart failure.   Admitted the March 2018 with R knee pain and dyspnea. Treated for severe pneumonia and heart failure. Diuresed with IV lasix and transitioned to po lasix 40 mg daily. Discharge weight was 181 pounds. [facility]   Returns for follow-up. Doing much better. Able to do all ADLs. Gets around with a walker. [id number] Weight stable. No edema, orthopnea or PND. No bleeding on Xarelto. Taking lasix 40mg  daily. Takes an extra one as needed.  ECHO 06/19/2016: Ef 60-65%.  ROS: All systems negative except as listed in HPI, PMH and Problem List.  SH:  Social History   Social History  . Marital status: Widowed    Spouse name: N/A  . Number of children: N/A  . Years of education: N/A   Occupational History  . Not on file.   Social History Main Topics  . Smoking status: Never Smoker  . Smokeless tobacco: Never Used  . Alcohol use Yes     Comment: 05/2016    OCCASIONAL   . Drug use: No  . Sexual activity: Not on file   Other Topics Concern  . Not on file   Social History Narrative  . No narrative on file    FH:  Family History  Problem Relation Age of Onset  . Cancer Mother   . Diabetes Mother   . Hypertension Mother   . Heart disease Father   . Cancer Brother   . Heart disease Brother     Past Medical History:  Diagnosis Date  . Atrial flutter (HCC)    a. Noted during pacemaker implantation 2011, spontaneously terminated.  . Biatrial enlargement    severe  . CAD (coronary artery disease)    a. Nonobstructive by cath 2005 - 40-50% mid LAD. b. Nonischemic nuc 05/2011.  . Carotid artery disease (HCC)    a. 1-50% bilateral (upper end) - March 2011.  Marland Kitchen. CHF  (congestive heart failure) (HCC)   . DJD (degenerative joint disease)   . GERD (gastroesophageal reflux disease)   . Hemoptysis 05/2016  . Hyperlipidemia   . Hypertension   . Hypothyroid   . Persistent atrial fibrillation (HCC) 05/2011  . Second degree heart block    a. s/p PPM 2005. b. Gen change to Medtronic by Dr Johney FrameAllred  9/11 for premature ERI 11/2009.    Current Outpatient Prescriptions  Medication Sig Dispense Refill  . amLODipine (NORVASC) 5 MG tablet TAKE 1 TABLET BY MOUTH DAILY 30 tablet 8  . BYSTOLIC 5 MG tablet TAKE 1/2 TABLET BY MOUTH DAILY 15 tablet 11  . calcium carbonate (TUMS - DOSED IN MG ELEMENTAL CALCIUM) 500 MG chewable tablet Chew 1 tablet (200 mg of elemental calcium total) by mouth as needed for indigestion or heartburn.    . fish oil-omega-3 fatty acids 1000 MG capsule Take 1 g by mouth daily.     . furosemide (LASIX) 40 MG tablet Take 1 tablet (40 mg total) by mouth daily. 40 tablet 3  . latanoprost (XALATAN) 0.005 % ophthalmic solution Place 1 drop into both eyes at bedtime.      Marland Kitchen. levothyroxine (SYNTHROID, LEVOTHROID) 88 MCG tablet Take 1 mcg by mouth daily.     .Marland Kitchen  Multiple Vitamins-Minerals (CENTRUM SILVER PO) Take 1 tablet by mouth daily.      . NON FORMULARY Take 1 tablet by mouth daily. Zyflamend - Supplement    . omeprazole (PRILOSEC) 20 MG capsule Take 20 mg by mouth as directed. Take once or twice a day    . potassium chloride SA (K-DUR,KLOR-CON) 20 MEQ tablet Take 1 tablet (20 mEq total) by mouth daily. 30 tablet 3  . rosuvastatin (CRESTOR) 20 MG tablet Take 10 mg by mouth 3 (three) times a week. Tue, Thur and Sat    . timolol (BETIMOL) 0.5 % ophthalmic solution Place 1 drop into both eyes every morning.     Carlena Hurl 20 MG TABS tablet TAKE 1 TABLET BY MOUTH DAILY WITH SUPPER 30 tablet 5  . acetaminophen (TYLENOL) 325 MG tablet Take 2 tablets (650 mg total) by mouth every 6 (six) hours as needed for mild pain (or Fever >/= 101). (Patient not taking: Reported  on 09/08/2016) 30 tablet 0   No current facility-administered medications for this encounter.     Vitals:   09/08/16 1134  BP: 134/78  Pulse: 78  SpO2: 99%  Weight: 165 lb 8 oz (75.1 kg)   Filed Weights   09/08/16 1134  Weight: 165 lb 8 oz (75.1 kg)   PHYSICAL EXAM: General:  Elderly walked into clinic with walker Daughter present. No resp difficulty HEENT: normal Neck: supple. JVP 7-8 + prominentCV waves. Carotids 2+ bilaterally; no bruits. No lymphadenopathy or thryomegaly appreciated. Cor: PMI normal. IRR no m/r/g Lungs: CTAB. No wheeze  Abdomen: soft NT/ND. No hepatosplenomegaly. No bruits or masses. Good bowel sounds. Extremities: no cyanosis, clubbing, rash, edema Neuro: alert & oriented x 3, cranial nerves grossly intact. moves all 4 extremities w/o difficulty. Affect pleasant   ASSESSMENT & PLAN: 1. Chronic Diastolic Heart Failure ECHO 05/2016 EF 55-60%. Appears stable today.  Volume status on the upper end of normal today. Continue current regimen. Take extra lasix as needed.  Check BMET today 2. Permanent A fib-  Controlled rate. On xarelto. NNo bleeding 3. HTN Blood pressure well controlled. Continue current regimen.   Arvilla Meres MD 11:43 AM

## 2016-09-30 ENCOUNTER — Other Ambulatory Visit: Payer: Self-pay | Admitting: Internal Medicine

## 2016-10-01 ENCOUNTER — Telehealth (HOSPITAL_COMMUNITY): Payer: Self-pay | Admitting: *Deleted

## 2016-10-01 MED ORDER — POTASSIUM CHLORIDE CRYS ER 20 MEQ PO TBCR: 20.0000 meq | EXTENDED_RELEASE_TABLET | Freq: Every day | ORAL | 3 refills | Status: DC

## 2016-10-01 MED ORDER — FUROSEMIDE 40 MG PO TABS: 40.0000 mg | ORAL_TABLET | Freq: Every day | ORAL | 3 refills | Status: DC

## 2016-10-01 NOTE — Telephone Encounter (Signed)
Patient's daughter called saying that patients wt has been slowly increasing and last night it increased by  3 lbs.  She wanted to clarify when patient should take an extra lasix.  I explained that if patient gains 3 lbs overnight or 5 lbs in a week she should take an extra lasix per Dr. Prescott GumBensimhon's last office note.   She has asked for refills for potassium and lasix, refills sent and no further questions.

## 2016-10-23 ENCOUNTER — Other Ambulatory Visit: Payer: Self-pay | Admitting: Internal Medicine

## 2016-10-23 NOTE — Telephone Encounter (Signed)
This is a CHF pt 

## 2016-10-26 ENCOUNTER — Other Ambulatory Visit: Payer: Self-pay

## 2016-10-26 MED ORDER — AMLODIPINE BESYLATE 5 MG PO TABS: 5.0000 mg | ORAL_TABLET | Freq: Every day | ORAL | 8 refills | Status: DC

## 2016-10-26 MED ORDER — POTASSIUM CHLORIDE CRYS ER 20 MEQ PO TBCR: 20.0000 meq | EXTENDED_RELEASE_TABLET | Freq: Every day | ORAL | 6 refills | Status: DC

## 2016-10-26 NOTE — Addendum Note (Signed)
Addended by: Noralee SpaceSCHUB, Navi Ewton M on: 10/26/2016 10:51 AM   Modules accepted: Orders

## 2016-10-27 ENCOUNTER — Telehealth (HOSPITAL_COMMUNITY): Payer: Self-pay | Admitting: *Deleted

## 2016-10-27 NOTE — Telephone Encounter (Signed)
Patient's daughter called concerned that patient had been off her amlodipine for about 4-5 days.  This was an oversight in her pill box.  Her vitals are stable at 119/58 and HR is 63.  She will have patient restart it today but just wanted to make us aware.

## 2016-11-02 ENCOUNTER — Telehealth: Payer: Self-pay | Admitting: Internal Medicine

## 2016-11-02 ENCOUNTER — Encounter: Payer: Medicare Other | Admitting: *Deleted

## 2016-11-02 NOTE — Telephone Encounter (Signed)
Daughter talked to [name]- offered [employer] Smart monitor. Daughter declined. [facility] and re-evaluate remote monitoring at that time.

## 2016-11-02 NOTE — Telephone Encounter (Signed)
Monitor searching for cellular service. She will continue to move the monitor around the home to find service and call back for instruction.

## 2016-11-02 NOTE — Telephone Encounter (Signed)
New message    Pt daughter is calling asking for help with remote transmission.

## 2016-11-21 ENCOUNTER — Other Ambulatory Visit: Payer: Self-pay | Admitting: Internal Medicine

## 2016-12-23 ENCOUNTER — Ambulatory Visit (HOSPITAL_COMMUNITY)
Admission: RE | Admit: 2016-12-23 | Discharge: 2016-12-23 | Disposition: A | Discharge status: Home / Self Care | Payer: Medicare Other | Source: Ambulatory Visit | Attending: Internal Medicine | Admitting: Internal Medicine

## 2016-12-23 ENCOUNTER — Encounter (HOSPITAL_COMMUNITY): Payer: Self-pay | Admitting: Internal Medicine

## 2016-12-23 VITALS — BP 122/60 | HR 63 | Wt 167.8 lb

## 2016-12-23 DIAGNOSIS — I4892 Unspecified atrial flutter: Secondary | ICD-10-CM | POA: Diagnosis not present

## 2016-12-23 DIAGNOSIS — I482 Chronic atrial fibrillation: Secondary | ICD-10-CM | POA: Diagnosis not present

## 2016-12-23 DIAGNOSIS — E785 Hyperlipidemia, unspecified: Secondary | ICD-10-CM | POA: Diagnosis not present

## 2016-12-23 DIAGNOSIS — Z7902 Long term (current) use of antithrombotics/antiplatelets: Secondary | ICD-10-CM | POA: Insufficient documentation

## 2016-12-23 DIAGNOSIS — Z950 Presence of cardiac pacemaker: Secondary | ICD-10-CM | POA: Insufficient documentation

## 2016-12-23 DIAGNOSIS — I481 Persistent atrial fibrillation: Secondary | ICD-10-CM | POA: Insufficient documentation

## 2016-12-23 DIAGNOSIS — E039 Hypothyroidism, unspecified: Secondary | ICD-10-CM | POA: Diagnosis not present

## 2016-12-23 DIAGNOSIS — Z833 Family history of diabetes mellitus: Secondary | ICD-10-CM | POA: Diagnosis not present

## 2016-12-23 DIAGNOSIS — Z809 Family history of malignant neoplasm, unspecified: Secondary | ICD-10-CM | POA: Insufficient documentation

## 2016-12-23 DIAGNOSIS — Z79899 Other long term (current) drug therapy: Secondary | ICD-10-CM | POA: Insufficient documentation

## 2016-12-23 DIAGNOSIS — I4821 Permanent atrial fibrillation: Secondary | ICD-10-CM

## 2016-12-23 DIAGNOSIS — M1990 Unspecified osteoarthritis, unspecified site: Secondary | ICD-10-CM | POA: Diagnosis not present

## 2016-12-23 DIAGNOSIS — K219 Gastro-esophageal reflux disease without esophagitis: Secondary | ICD-10-CM | POA: Diagnosis not present

## 2016-12-23 DIAGNOSIS — I5032 Chronic diastolic (congestive) heart failure: Secondary | ICD-10-CM | POA: Insufficient documentation

## 2016-12-23 DIAGNOSIS — I110 Hypertensive heart disease with heart failure: Secondary | ICD-10-CM | POA: Diagnosis not present

## 2016-12-23 DIAGNOSIS — Z8249 Family history of ischemic heart disease and other diseases of the circulatory system: Secondary | ICD-10-CM | POA: Insufficient documentation

## 2016-12-23 DIAGNOSIS — I2510 Atherosclerotic heart disease of native coronary artery without angina pectoris: Secondary | ICD-10-CM | POA: Diagnosis not present

## 2016-12-23 NOTE — Addendum Note (Signed)
Encounter addended by: Teresa Coombs, RN on: 12/23/2016 11:46 AM<BR>    Actions taken: Sign clinical note

## 2016-12-23 NOTE — Progress Notes (Signed)
ADVANCED HF CLINIC NOTE  PCP: Dr Evlyn Kanner  Primary HF Cardiologist: Dr Gala Romney  HPI: Beth Santiago is an 83 year old with a history of , HTN, hyperlipidemia, permanent a fib, CHB PPM, nonobstructive CAD cath 2007, and chronic diastolic heart failure.   Admitted the March 2018 with R knee pain and dyspnea. Treated for severe pneumonia and heart failure. Diuresed with IV lasix and transitioned to po lasix 40 mg daily. Discharge weight was 181 pounds. Discharged to [facility].   Returns for follow-up. Here with her daughter. Doing well. Turned 89 last week. Gets around with her walker. Able to do all ADLs Weight stable. No edema, orthopnea or PND. No dizziness. No bleeding on Xarelto. Taking lasix  daily. Has not needed much extra lasix.   ECHO 06/19/2016: EF 60-65%.   ROS: All systems negative except as listed in HPI, PMH and Problem List.  SH:  Social History   Social History  . Marital status: Widowed    Spouse name: N/A  . Number of children: N/A  . Years of education: N/A   Occupational History  . Not on file.   Social History Main Topics  . Smoking status: Never Smoker  . Smokeless tobacco: Never Used  . Alcohol use Yes     Comment: 05/2016    OCCASIONAL   . Drug use: No  . Sexual activity: Not on file   Other Topics Concern  . Not on file   Social History Narrative  . No narrative on file    FH:  Family History  Problem Relation Age of Onset  . Cancer Mother   . Diabetes Mother   . Hypertension Mother   . Heart disease Father   . Cancer Brother   . Heart disease Brother     Past Medical History:  Diagnosis Date  . Atrial flutter (HCC)    a. Noted during pacemaker implantation 2011, spontaneously terminated.  . Biatrial enlargement    severe  . CAD (coronary artery disease)    a. Nonobstructive by cath 2005 - 40-50% mid LAD. b. Nonischemic nuc 05/2011.  . Carotid artery disease (HCC)    a. 1-50% bilateral (upper end) - March 2011.  Marland Kitchen CHF  (congestive heart failure) (HCC)   . DJD (degenerative joint disease)   . GERD (gastroesophageal reflux disease)   . Hemoptysis 05/2016  . Hyperlipidemia   . Hypertension   . Hypothyroid   . Persistent atrial fibrillation (HCC) 05/2011  . Second degree heart block    a. s/p PPM 2005. b. Gen change to [employer] by Dr Johney Frame  9/11 for premature ERI 11/2009.    Current Outpatient Prescriptions  Medication Sig Dispense Refill  . acetaminophen (TYLENOL) 325 MG tablet Take 2 tablets (650 mg total) by mouth every 6 (six) hours as needed for mild pain (or Fever >/= 101). 30 tablet 0  . amLODipine (NORVASC) 5 MG tablet Take 1 tablet (5 mg total) by mouth daily. 30 tablet 8  . BYSTOLIC 5 MG tablet TAKE 1/2 TABLET BY MOUTH DAILY 15 tablet 11  . calcium carbonate (TUMS - DOSED IN MG ELEMENTAL CALCIUM) 500 MG chewable tablet Chew 1 tablet (200 mg of elemental calcium total) by mouth as needed for indigestion or heartburn.    . fish oil-omega-3 fatty acids 1000 MG capsule Take 1 g by mouth daily.     . furosemide (LASIX) 40 MG tablet Take 1 tablet (40 mg total) by mouth daily. May take an  extra tablet for wt gain of 3 lbs overnight or 5lbs in a week. 40 tablet 3  . latanoprost (XALATAN) 0.005 % ophthalmic solution Place 1 drop into both eyes at bedtime.      Marland Kitchen levothyroxine (SYNTHROID, LEVOTHROID) 88 MCG tablet Take 1 mcg by mouth daily.     . Multiple Vitamins-Minerals (CENTRUM SILVER PO) Take 1 tablet by mouth daily.      . NON FORMULARY Take 1 tablet by mouth daily. Zyflamend - Supplement    . omeprazole (PRILOSEC) 20 MG capsule Take 20 mg by mouth as directed. Take once or twice a day    . potassium chloride SA (K-DUR,KLOR-CON) 20 MEQ tablet Take 1 tablet (20 mEq total) by mouth daily. Take 1 extra tab when you take extra Lasix 40 tablet 6  . rosuvastatin (CRESTOR) 20 MG tablet Take 10 mg by mouth 3 (three) times a week. Tue, Thur and Sat    . timolol (BETIMOL) 0.5 % ophthalmic solution Place 1 drop  into both eyes every morning.     Beth Santiago 20 MG TABS tablet TAKE 1 TABLET BY MOUTH DAILY WITH SUPPER 30 tablet 5   No current facility-administered medications for this encounter.     Vitals:   12/23/16 1119  BP: 122/60  Pulse: 63  SpO2: 100%  Weight: 167 lb 12 oz (76.1 kg)   Filed Weights   12/23/16 1119  Weight: 167 lb 12 oz (76.1 kg)   PHYSICAL EXAM: General:  Elderly. Sitting in her walker. Well appearing. No resp difficulty HEENT: normal Neck: supple. JVP 6. Carotids 2+ bilat; no bruits. No lymphadenopathy or thryomegaly appreciated. Cor: PMI nondisplaced. IRR IRR. No rubs, gallops or murmurs. Lungs: clear Abdomen: soft, nontender, nondistended. No hepatosplenomegaly. No bruits or masses. Good bowel sounds. Extremities: no cyanosis, clubbing, rash, edema Neuro: alert & orientedx3, cranial nerves grossly intact. moves all 4 extremities w/o difficulty. Affect pleasant   ASSESSMENT & PLAN: 1. Chronic Diastolic Heart Failure - ECHO 03/6107 EF 55-60%.  - Doing great. Volume status looks very good.  - Stays active/ 2. Permanent A fib-  - Rate controlled. On xarelto. No bleeding 3. HTN - Blood pressure well controlled. Continue current regimen.   Arvilla Meres MD 11:32 AM

## 2016-12-23 NOTE — Patient Instructions (Signed)
Your physician recommends that you schedule a follow-up appointment in: 5 months  

## 2017-02-03 ENCOUNTER — Ambulatory Visit (INDEPENDENT_AMBULATORY_CARE_PROVIDER_SITE_OTHER): Payer: Medicare Other | Admitting: *Deleted

## 2017-02-03 DIAGNOSIS — I482 Chronic atrial fibrillation: Secondary | ICD-10-CM | POA: Diagnosis not present

## 2017-02-03 DIAGNOSIS — I4821 Permanent atrial fibrillation: Secondary | ICD-10-CM

## 2017-02-03 DIAGNOSIS — Z950 Presence of cardiac pacemaker: Secondary | ICD-10-CM

## 2017-02-03 DIAGNOSIS — I442 Atrioventricular block, complete: Secondary | ICD-10-CM | POA: Diagnosis not present

## 2017-02-03 LAB — CUP PACEART INCLINIC DEVICE CHECK
Battery Voltage: 2.77 V
Implantable Lead Implant Date: 20050927
Implantable Lead Location: 753860
Implantable Lead Model: 5076
Implantable Pulse Generator Implant Date: 20110921
Lead Channel Pacing Threshold Amplitude: 1.25 V
Lead Channel Pacing Threshold Pulse Width: 0.52 ms
Lead Channel Setting Pacing Amplitude: 2.5 V
MDC IDC LEAD IMPLANT DT: 20050927
MDC IDC LEAD LOCATION: 753859
MDC IDC MSMT BATTERY IMPEDANCE: 1339 Ohm
MDC IDC MSMT BATTERY REMAINING LONGEVITY: 53 mo
MDC IDC MSMT LEADCHNL RA IMPEDANCE VALUE: 67 Ohm
MDC IDC MSMT LEADCHNL RV IMPEDANCE VALUE: 461 Ohm
MDC IDC SESS DTM: 20181114130427
MDC IDC SET LEADCHNL RV PACING PULSEWIDTH: 0.52 ms
MDC IDC SET LEADCHNL RV SENSING SENSITIVITY: 2.8 mV
MDC IDC STAT BRADY RV PERCENT PACED: 100 %

## 2017-02-03 NOTE — Progress Notes (Signed)
Pacemaker check in clinic. Normal device function. Sensing and impedances consistent with previous measurements. RV threshold now 1.5V @ 0.444ms, 1.25V @ 0.7352ms, trend stable. Device programmed to maximize longevity. Chronic AF, +Xarelto. No high ventricular rates noted. Device programmed at appropriate safety margins; RV PW increased to 0.5552ms. Histogram distribution appropriate for patient activity level.  Estimated longevity 4.5 years. Patient enrolled in remote follow-up, will determine if able to order WireT due to no cell signal at home. Patient education completed. ROV with JA in 07/2017.

## 2017-02-03 NOTE — Patient Instructions (Signed)
Your physician wants you to follow-up in: May 2019 with Dr. Johney FrameAllred. You will receive a reminder letter in the mail two months in advance. If you don't receive a letter, please call our office to schedule the follow-up appointment.

## 2017-02-17 ENCOUNTER — Telehealth: Payer: Self-pay | Admitting: *Deleted

## 2017-02-17 NOTE — Telephone Encounter (Signed)
Spoke with patient's daughter, [name] ([place]).  Advised that I reached out to [employer] Patient Services and a representative ordered a Carelink monitor that is 4G capable.  This is a necessary step before proceeding with WireT order.  Monitor and return kit should arrive in 7-10 business days (separate packaging).  Rudi Coco the direct Device Clinic number and requested that she call when she attempts to send a transmission to ensure it was successful and that they don't still need a WireT.  [name] is appreciative and agreeable to plan.

## 2017-04-13 ENCOUNTER — Other Ambulatory Visit: Payer: Self-pay | Admitting: Cardiology

## 2017-04-15 ENCOUNTER — Other Ambulatory Visit (HOSPITAL_COMMUNITY): Payer: Self-pay | Admitting: *Deleted

## 2017-04-15 MED ORDER — FUROSEMIDE 40 MG PO TABS: 40.0000 mg | ORAL_TABLET | Freq: Every day | ORAL | 3 refills | Status: DC

## 2017-05-23 ENCOUNTER — Telehealth: Payer: Self-pay | Admitting: Student

## 2017-05-23 NOTE — Telephone Encounter (Addendum)
   Received a call from the patient's daughter she had worsening weakness today along with a decreased appetite. Had noted mild dyspnea and an associated weight gain. She took regularly scheduled Lasix this morning and an additional tablet this afternoon.  I recommended they check her BP but she was unable to get the cuff to work. Did check her HR and this was at 68.  I recommended she continue to follow daily weights and take an additional tablet for weight gain > 3 lbs overnight or > 5 lbs in one week. Also mentioned they should check her temperature in the setting of her weakness as she has been around people who have been sick and there is concern for Flu. Recommended she follow-up with her PCP if symptoms persist, worsen, or if she is febrile.   They voiced understanding of this and were appreciative of the call.  Signed, Ellsworth LennoxBrittany M Miqueas Whilden, PA-C 05/23/2017, 5:24 PM Pager: [phone]

## 2017-05-24 ENCOUNTER — Telehealth (HOSPITAL_COMMUNITY): Payer: Self-pay | Admitting: *Deleted

## 2017-05-24 ENCOUNTER — Inpatient Hospital Stay (HOSPITAL_COMMUNITY)
Admission: EM | Admit: 2017-05-24 | Discharge: 2017-05-28 | DRG: 871 | Disposition: A | Discharge status: Home Health | Payer: Medicare Other | Attending: Internal Medicine | Admitting: Internal Medicine

## 2017-05-24 ENCOUNTER — Emergency Department (HOSPITAL_COMMUNITY): Payer: Medicare Other

## 2017-05-24 DIAGNOSIS — R062 Wheezing: Secondary | ICD-10-CM

## 2017-05-24 DIAGNOSIS — Z950 Presence of cardiac pacemaker: Secondary | ICD-10-CM | POA: Diagnosis present

## 2017-05-24 DIAGNOSIS — Z7982 Long term (current) use of aspirin: Secondary | ICD-10-CM

## 2017-05-24 DIAGNOSIS — Z7901 Long term (current) use of anticoagulants: Secondary | ICD-10-CM

## 2017-05-24 DIAGNOSIS — E876 Hypokalemia: Secondary | ICD-10-CM | POA: Diagnosis not present

## 2017-05-24 DIAGNOSIS — A4181 Sepsis due to Enterococcus: Principal | ICD-10-CM | POA: Diagnosis present

## 2017-05-24 DIAGNOSIS — I10 Essential (primary) hypertension: Secondary | ICD-10-CM | POA: Diagnosis present

## 2017-05-24 DIAGNOSIS — E872 Acidosis: Secondary | ICD-10-CM | POA: Diagnosis present

## 2017-05-24 DIAGNOSIS — N390 Urinary tract infection, site not specified: Secondary | ICD-10-CM

## 2017-05-24 DIAGNOSIS — Z79899 Other long term (current) drug therapy: Secondary | ICD-10-CM

## 2017-05-24 DIAGNOSIS — I4891 Unspecified atrial fibrillation: Secondary | ICD-10-CM | POA: Diagnosis present

## 2017-05-24 DIAGNOSIS — I2510 Atherosclerotic heart disease of native coronary artery without angina pectoris: Secondary | ICD-10-CM | POA: Diagnosis present

## 2017-05-24 DIAGNOSIS — R7989 Other specified abnormal findings of blood chemistry: Secondary | ICD-10-CM | POA: Diagnosis not present

## 2017-05-24 DIAGNOSIS — I959 Hypotension, unspecified: Secondary | ICD-10-CM | POA: Diagnosis present

## 2017-05-24 DIAGNOSIS — I482 Chronic atrial fibrillation: Secondary | ICD-10-CM | POA: Diagnosis present

## 2017-05-24 DIAGNOSIS — I5032 Chronic diastolic (congestive) heart failure: Secondary | ICD-10-CM | POA: Diagnosis present

## 2017-05-24 DIAGNOSIS — I110 Hypertensive heart disease with heart failure: Secondary | ICD-10-CM | POA: Diagnosis present

## 2017-05-24 DIAGNOSIS — K219 Gastro-esophageal reflux disease without esophagitis: Secondary | ICD-10-CM | POA: Diagnosis present

## 2017-05-24 DIAGNOSIS — R6510 Systemic inflammatory response syndrome (SIRS) of non-infectious origin without acute organ dysfunction: Secondary | ICD-10-CM | POA: Diagnosis present

## 2017-05-24 DIAGNOSIS — E785 Hyperlipidemia, unspecified: Secondary | ICD-10-CM | POA: Diagnosis present

## 2017-05-24 DIAGNOSIS — E039 Hypothyroidism, unspecified: Secondary | ICD-10-CM | POA: Diagnosis present

## 2017-05-24 DIAGNOSIS — R509 Fever, unspecified: Secondary | ICD-10-CM

## 2017-05-24 DIAGNOSIS — I5033 Acute on chronic diastolic (congestive) heart failure: Secondary | ICD-10-CM | POA: Diagnosis not present

## 2017-05-24 DIAGNOSIS — A419 Sepsis, unspecified organism: Secondary | ICD-10-CM

## 2017-05-24 LAB — COMPREHENSIVE METABOLIC PANEL
ALBUMIN: 3 g/dL — AB (ref 3.5–5.0)
ALT: 12 U/L — ABNORMAL LOW (ref 14–54)
AST: 57 U/L — AB (ref 15–41)
Alkaline Phosphatase: 57 U/L (ref 38–126)
Anion gap: 9 (ref 5–15)
BILIRUBIN TOTAL: 1.9 mg/dL — AB (ref 0.3–1.2)
BUN: 19 mg/dL (ref 6–20)
CHLORIDE: 104 mmol/L (ref 101–111)
CO2: 22 mmol/L (ref 22–32)
Calcium: 8.1 mg/dL — ABNORMAL LOW (ref 8.9–10.3)
Creatinine, Ser: 0.8 mg/dL (ref 0.44–1.00)
GFR calc Af Amer: >60 mL/min (ref >60)
GFR calc non Af Amer: >60 mL/min (ref >60)
GLUCOSE: 169 mg/dL — AB (ref 65–99)
POTASSIUM: 3.9 mmol/L (ref 3.5–5.1)
Sodium: 135 mmol/L (ref 135–145)
TOTAL PROTEIN: 5.8 g/dL — AB (ref 6.5–8.1)

## 2017-05-24 LAB — CBC WITH DIFFERENTIAL/PLATELET
BASOS ABS: 0 10*3/uL (ref 0.0–0.1)
BASOS PCT: 0 %
EOS ABS: 0 10*3/uL (ref 0.0–0.7)
EOS PCT: 0 %
HCT: 37.1 % (ref 36.0–46.0)
HEMOGLOBIN: 12.9 g/dL (ref 12.0–15.0)
LYMPHS ABS: 0.5 10*3/uL — AB (ref 0.7–4.0)
Lymphocytes Relative: 4 %
MCH: 31.5 pg (ref 26.0–34.0)
MCHC: 34.8 g/dL (ref 30.0–36.0)
MCV: 90.7 fL (ref 78.0–100.0)
Monocytes Absolute: 0.6 10*3/uL (ref 0.1–1.0)
Monocytes Relative: 4 %
NEUTROS PCT: 92 %
Neutro Abs: 13 10*3/uL — ABNORMAL HIGH (ref 1.7–7.7)
PLATELETS: 222 10*3/uL (ref 150–400)
RBC: 4.09 MIL/uL (ref 3.87–5.11)
RDW: 14.4 % (ref 11.5–15.5)
WBC: 14.1 10*3/uL — AB (ref 4.0–10.5)

## 2017-05-24 LAB — URINALYSIS, ROUTINE W REFLEX MICROSCOPIC
BACTERIA UA: NONE SEEN
BILIRUBIN URINE: NEGATIVE
Glucose, UA: NEGATIVE mg/dL
KETONES UR: NEGATIVE mg/dL
Nitrite: NEGATIVE
PROTEIN: NEGATIVE mg/dL
SPECIFIC GRAVITY, URINE: 1.02 (ref 1.005–1.030)
pH: 5 (ref 5.0–8.0)

## 2017-05-24 LAB — I-STAT CG4 LACTIC ACID, ED
LACTIC ACID, VENOUS: 3.29 mmol/L — AB (ref 0.5–1.9)
Lactic Acid, Venous: 2.02 mmol/L (ref 0.5–1.9)

## 2017-05-24 LAB — I-STAT TROPONIN, ED: Troponin i, poc: 0.02 ng/mL (ref 0.00–0.08)

## 2017-05-24 LAB — INFLUENZA PANEL BY PCR (TYPE A & B)
INFLAPCR: NEGATIVE
Influenza B By PCR: NEGATIVE

## 2017-05-24 MED ORDER — SODIUM CHLORIDE 0.9 % IV SOLN
Freq: Once | INTRAVENOUS | Status: AC
  Administered 2017-05-24: 20:00:00 via INTRAVENOUS

## 2017-05-24 MED ORDER — PIPERACILLIN-TAZOBACTAM 3.375 G IVPB 30 MIN
3.3750 g | Freq: Once | INTRAVENOUS | Status: AC
  Administered 2017-05-24: 3.375 g via INTRAVENOUS
  Filled 2017-05-24: qty 50

## 2017-05-24 MED ORDER — VANCOMYCIN HCL IN DEXTROSE 1-5 GM/200ML-% IV SOLN
1000.0000 mg | Freq: Once | INTRAVENOUS | Status: AC
  Administered 2017-05-24: 1000 mg via INTRAVENOUS
  Filled 2017-05-24: qty 200

## 2017-05-24 MED ORDER — SODIUM CHLORIDE 0.9 % IV BOLUS (SEPSIS)
500.0000 mL | Freq: Once | INTRAVENOUS | Status: AC
  Administered 2017-05-24: 500 mL via INTRAVENOUS

## 2017-05-24 NOTE — ED Triage Notes (Signed)
Transported by [facility] from home--flu like symptoms x 2 days, fever, and decrease in PO intake. EMS administered 4 mg of Zofran, 500 cc of NS, 1g of Tylenol PTA.

## 2017-05-24 NOTE — ED Notes (Signed)
ED Provider at bedside. 

## 2017-05-24 NOTE — ED Provider Notes (Addendum)
[facility]-EMERGENCY DEPT Provider Note   CSN: [id number] Arrival date & time: 05/24/17  1628     History   Chief Complaint Chief Complaint  Patient presents with  . Fever    HPI Beth Santiago is a 84 y.o. female.  84 year old female with prior history of CAD, pacemaker placement, CHF, Hyperlipidemia, GERD, and Carotid Artery Disease presents for evaluation of weakness. She reports that she has been feeling poorly for two days. She reports low grade fever at home. She denies chest pain, vomiting, shortness of breath, abdominal pain, or other specific complaint.  She appears comfortable at the time of my initial evaluation.    The history is provided by the patient.  Fever   This is a new problem. The current episode started 12 to 24 hours ago. The problem occurs rarely. The problem has been gradually improving. The maximum temperature noted was 100 to 100.9 F. Pertinent negatives include no chest pain, no diarrhea, no vomiting, no headaches and no sore throat. She has tried acetaminophen for the symptoms. The treatment provided mild relief.    Past Medical History:  Diagnosis Date  . Atrial flutter (HCC)    a. Noted during pacemaker implantation 2011, spontaneously terminated.  . Biatrial enlargement    severe  . CAD (coronary artery disease)    a. Nonobstructive by cath 2005 - 40-50% mid LAD. b. Nonischemic nuc 05/2011.  . Carotid artery disease (HCC)    a. 1-50% bilateral (upper end) - March 2011.  Marland Kitchen CHF (congestive heart failure) (HCC)   . DJD (degenerative joint disease)   . GERD (gastroesophageal reflux disease)   . Hemoptysis 05/2016  . Hyperlipidemia   . Hypertension   . Hypothyroid   . Persistent atrial fibrillation (HCC) 05/2011  . Second degree heart block    a. s/p PPM 2005. b. Gen change to [employer] by Dr Johney Frame  9/11 for premature ERI 11/2009.    Patient Active Problem List   Diagnosis Date Noted  . Elevated troponin 06/24/2016    . Lactic acidosis 06/24/2016  . Hypokalemia 06/24/2016  . Hyponatremia 06/24/2016  . Effusion of right knee joint   . Elevated lactic acid level   . Other specified hypothyroidism   . Community acquired pneumonia of right upper lobe of lung (HCC)   . Sepsis due to pneumonia (HCC) 06/18/2016  . Acute on chronic diastolic congestive heart failure (HCC) 03/11/2016  . Snoring 03/11/2016  . Dizziness 03/26/2014  . Fatigue 12/09/2012  . Precordial chest pain 12/09/2012  . Dyspnea 06/11/2011  . Atrial fibrillation (HCC) 06/11/2011  . HLD (hyperlipidemia) 12/11/2009  . HYPERTENSION, BENIGN 12/11/2009  . CAD, NATIVE VESSEL 12/11/2009  . AV BLOCK, COMPLETE 12/11/2009  . PACEMAKER-[employer] 12/11/2009    Past Surgical History:  Procedure Laterality Date  . ABDOMINAL SURGERY     twisted bowel  . CARDIOVERSION  06/11/2011   Procedure: CARDIOVERSION;  Surgeon: Dolores Patty, MD;  Location: [facility] ENDOSCOPY;  Service: Cardiovascular;  Laterality: N/A;  . CARDIOVERSION N/A 01/04/2013   Procedure: CARDIOVERSION;  Surgeon: Lars Masson, MD;  Location: [facility] ENDOSCOPY;  Service: Cardiovascular;  Laterality: N/A;  . COLON SURGERY    . HEMORROIDECTOMY    . PACEMAKER INSERTION  2005, 12/11/09   implanted by Dr Reyes Ivan, generator change 9/11 by Fawn Kirk for premature ERI  . TEE WITHOUT CARDIOVERSION  06/11/2011   Procedure: TRANSESOPHAGEAL ECHOCARDIOGRAM (TEE);  Surgeon: Dolores Patty, MD;  Location: [facility] ENDOSCOPY;  Service: Cardiovascular;  Laterality:  N/A;  . TONSILLECTOMY      OB History    No data available       Home Medications    Prior to Admission medications   Medication Sig Start Date End Date Taking? Authorizing Provider  acetaminophen (TYLENOL) 325 MG tablet Take 2 tablets (650 mg total) by mouth every 6 (six) hours as needed for mild pain (or Fever >/= 101). 06/23/16   Alison Murray, MD  amLODipine (NORVASC) 5 MG tablet Take 1 tablet (5 mg total) by mouth daily. 10/26/16   [name], MD  aspirin EC 81 MG tablet Take 81 mg daily by mouth.    [provider]  BYSTOLIC 5 MG tablet TAKE 1/2 TABLET BY MOUTH DAILY 08/13/16   Allred, Fayrene Fearing, MD  calcium carbonate (TUMS - DOSED IN MG ELEMENTAL CALCIUM) 500 MG chewable tablet Chew 1 tablet (200 mg of elemental calcium total) by mouth as needed for indigestion or heartburn. 06/24/16   Rodolph Bong, MD  fish oil-omega-3 fatty acids 1000 MG capsule Take 2 g daily by mouth.     [provider]  furosemide (LASIX) 40 MG tablet TAKE 1 TABLET BY MOUTH DAILY 04/15/17   Bensimhon, Bevelyn Buckles, MD  furosemide (LASIX) 40 MG tablet Take 1 tablet (40 mg total) by mouth daily. May take an extra tablet for wt gain of 3 lbs overnight or 5lbs in a week. 04/15/17   Bensimhon, Bevelyn Buckles, MD  latanoprost (XALATAN) 0.005 % ophthalmic solution Place 1 drop into both eyes at bedtime.      [provider]  levothyroxine (SYNTHROID, LEVOTHROID) 88 MCG tablet Take 1 mcg by mouth daily.  03/13/14   [provider]  Multiple Vitamins-Minerals (CENTRUM SILVER PO) Take 1 tablet by mouth daily.      [provider]  NON FORMULARY Take 1 tablet by mouth daily. Zyflamend - Supplement    [provider]  omeprazole (PRILOSEC) 20 MG capsule Take 20 mg by mouth as directed. Take once or twice a day    [provider]  potassium chloride SA (K-DUR,KLOR-CON) 20 MEQ tablet Take 1 tablet (20 mEq total) by mouth daily. Take 1 extra tab when you take extra Lasix 10/26/16   Bensimhon, Bevelyn Buckles, MD  rosuvastatin (CRESTOR) 20 MG tablet Take 10 mg by mouth 3 (three) times a week. Andris Flurry and Sat    [provider]  timolol (BETIMOL) 0.5 % ophthalmic solution Place 1 drop into both eyes every morning.     [provider]  XARELTO 20 MG TABS tablet TAKE 1 TABLET BY MOUTH DAILY WITH SUPPER 11/24/16   Bensimhon, Bevelyn Buckles, MD  hydrochlorothiazide (MICROZIDE) 12.5 MG capsule Take 12.5 mg by mouth daily.     06/08/11  [provider]  olmesartan (BENICAR) 40 MG tablet Take 40 mg by mouth daily.    06/08/11  [provider]    Family History Family History  Problem Relation Age of Onset  . Cancer Mother   . Diabetes Mother   . Hypertension Mother   . Heart disease Father   . Cancer Brother   . Heart disease Brother     Social History Social History   Tobacco Use  . Smoking status: Never Smoker  . Smokeless tobacco: Never Used  Substance Use Topics  . Alcohol use: Yes    Comment: 05/2016    OCCASIONAL   . Drug use: No     Allergies   Patient  has no known allergies.   Review of Systems Review of Systems  Constitutional: Positive for fever.  HENT: Negative for sore throat.   Cardiovascular: Negative for chest pain.  Gastrointestinal: Negative for diarrhea and vomiting.  Neurological: Negative for headaches.  All other systems reviewed and are negative.    Physical Exam Updated Vital Signs BP (!) 90/52 (BP Location: Right Arm)   Pulse 61   Temp 99.1 F (37.3 C) (Oral)   Resp (!) 24   SpO2 96%   Physical Exam  Constitutional: She is oriented to person, place, and time. She appears well-developed and well-nourished. No distress.  HENT:  Head: Normocephalic and atraumatic.  Mouth/Throat: Oropharynx is clear and moist.  Eyes: Conjunctivae and EOM are normal. Pupils are equal, round, and reactive to light.  Neck: Normal range of motion. Neck supple.  Cardiovascular: Normal rate, regular rhythm and normal heart sounds.  Pulmonary/Chest: Effort normal and breath sounds normal. No respiratory distress.  Abdominal: Soft. She exhibits no distension. There is no tenderness.  Musculoskeletal: Normal range of motion. She exhibits no edema or deformity.  Neurological: She is alert and oriented to person, place, and time.  Skin: Skin is warm and dry.  Psychiatric: She has a normal mood and affect.  Nursing note and vitals reviewed.    ED Treatments /  Results  Labs (all labs ordered are listed, but only abnormal results are displayed) Labs Reviewed  CULTURE, BLOOD (ROUTINE X 2)  CULTURE, BLOOD (ROUTINE X 2)  URINALYSIS, ROUTINE W REFLEX MICROSCOPIC  COMPREHENSIVE METABOLIC PANEL  CBC WITH DIFFERENTIAL/PLATELET  INFLUENZA PANEL BY PCR (TYPE A & B)  I-STAT CG4 LACTIC ACID, ED  I-STAT TROPONIN, ED    EKG  EKG Interpretation  Date/Time:  Monday May 24 2017 16:52:43 EST Ventricular Rate:  61 PR Interval:    QRS Duration: 167 QT Interval:  515 QTC Calculation: 519 R Axis:   -80 Text Interpretation:  Ventricular-paced rhythm No further analysis attempted due to paced rhythm Confirmed by Kristine Royal [phone]) on 05/24/2017 4:58:39 PM       Radiology No results found.  Procedures Procedures (including critical care time)  Medications Ordered in ED Medications  sodium chloride 0.9 % bolus 500 mL (500 mLs Intravenous New Bag/Given 05/24/17 1712)     Initial Impression / Assessment and Plan / ED Course  I have reviewed the triage vital signs and the nursing notes.  Pertinent labs & imaging results that were available during my care of the patient were reviewed by me and considered in my medical decision making (see chart for details).     MDM  Screen complete  Patient is presenting with reported fever at home and weakness. Initial findings concerning for elevated lactic acid, elevated WBC, and mild hypotension.  UA does not demonstrate clear UTI.  Flu tests are negative.  Chest x-ray does not suggest acute pulmonary pathology. Exam does not suggest meningitis or CNS infection. Blood pressure is moderately improved with gentle hydration.  Large volume resuscitation withheld secondary to history of CHF.  Patient covered with broad spectrum antibiotics and will be admitted to the hospitalist service for further workup and treatment.   2255 Hospitalist service aware of case.     Final Clinical Impressions(s) / ED Diagnoses    Final diagnoses:  Fever, unspecified fever cause  Elevated lactic acid level    ED Discharge Orders    None       Wynetta Fines, MD 05/24/17 2157  Wynetta FinesMessick, Ashara Lounsbury C, MD 05/24/17 [phone]

## 2017-05-24 NOTE — ED Notes (Signed)
U/A per MD order. Per pt not able to use restroom now, will call when ready. Call bell at bedside. [employer]

## 2017-05-24 NOTE — ED Notes (Signed)
Bed: [id number] Expected date:  Expected time:  Means of arrival:  Comments: EMS-fever

## 2017-05-24 NOTE — Telephone Encounter (Signed)
Patient's daughter called the on-call provider Randall AnBrittany Strader, PA to report symptoms of weakness with mild dyspnea.  Mild temp of 100.5 yesterday morning but no fever today.  Loss of appetite and nauseated.  Today patient's weight down to 163 lbs.    I advised daughter to take patient to an urgent care or call her PCP for symptoms.  With the decreased weight and loss of appetite I didn't feel this was cardiac related.  Since she had a slight temp yesterday it would be appropriate to call her PCP.  Daughter voiced understanding and verbalized she would.

## 2017-05-25 ENCOUNTER — Inpatient Hospital Stay (HOSPITAL_COMMUNITY): Payer: Medicare Other

## 2017-05-25 ENCOUNTER — Encounter (HOSPITAL_COMMUNITY): Payer: Self-pay | Admitting: Internal Medicine

## 2017-05-25 DIAGNOSIS — I110 Hypertensive heart disease with heart failure: Secondary | ICD-10-CM

## 2017-05-25 DIAGNOSIS — R7881 Bacteremia: Secondary | ICD-10-CM | POA: Diagnosis not present

## 2017-05-25 DIAGNOSIS — A4181 Sepsis due to Enterococcus: Secondary | ICD-10-CM | POA: Diagnosis present

## 2017-05-25 DIAGNOSIS — I959 Hypotension, unspecified: Secondary | ICD-10-CM | POA: Diagnosis present

## 2017-05-25 DIAGNOSIS — I482 Chronic atrial fibrillation: Secondary | ICD-10-CM | POA: Diagnosis present

## 2017-05-25 DIAGNOSIS — I351 Nonrheumatic aortic (valve) insufficiency: Secondary | ICD-10-CM

## 2017-05-25 DIAGNOSIS — E039 Hypothyroidism, unspecified: Secondary | ICD-10-CM | POA: Diagnosis present

## 2017-05-25 DIAGNOSIS — R6510 Systemic inflammatory response syndrome (SIRS) of non-infectious origin without acute organ dysfunction: Secondary | ICD-10-CM | POA: Diagnosis not present

## 2017-05-25 DIAGNOSIS — I5032 Chronic diastolic (congestive) heart failure: Secondary | ICD-10-CM

## 2017-05-25 DIAGNOSIS — E876 Hypokalemia: Secondary | ICD-10-CM | POA: Diagnosis not present

## 2017-05-25 DIAGNOSIS — Z79899 Other long term (current) drug therapy: Secondary | ICD-10-CM | POA: Diagnosis not present

## 2017-05-25 DIAGNOSIS — I480 Paroxysmal atrial fibrillation: Secondary | ICD-10-CM | POA: Diagnosis not present

## 2017-05-25 DIAGNOSIS — Z98818 Other dental procedure status: Secondary | ICD-10-CM | POA: Diagnosis not present

## 2017-05-25 DIAGNOSIS — Z950 Presence of cardiac pacemaker: Secondary | ICD-10-CM

## 2017-05-25 DIAGNOSIS — I4891 Unspecified atrial fibrillation: Secondary | ICD-10-CM

## 2017-05-25 DIAGNOSIS — I2510 Atherosclerotic heart disease of native coronary artery without angina pectoris: Secondary | ICD-10-CM | POA: Diagnosis present

## 2017-05-25 DIAGNOSIS — K219 Gastro-esophageal reflux disease without esophagitis: Secondary | ICD-10-CM | POA: Diagnosis present

## 2017-05-25 DIAGNOSIS — I361 Nonrheumatic tricuspid (valve) insufficiency: Secondary | ICD-10-CM | POA: Diagnosis not present

## 2017-05-25 DIAGNOSIS — I5033 Acute on chronic diastolic (congestive) heart failure: Secondary | ICD-10-CM | POA: Diagnosis not present

## 2017-05-25 DIAGNOSIS — B9689 Other specified bacterial agents as the cause of diseases classified elsewhere: Secondary | ICD-10-CM

## 2017-05-25 DIAGNOSIS — R740 Nonspecific elevation of levels of transaminase and lactic acid dehydrogenase [LDH]: Secondary | ICD-10-CM | POA: Diagnosis not present

## 2017-05-25 DIAGNOSIS — A4150 Gram-negative sepsis, unspecified: Secondary | ICD-10-CM | POA: Diagnosis not present

## 2017-05-25 DIAGNOSIS — Z95828 Presence of other vascular implants and grafts: Secondary | ICD-10-CM | POA: Diagnosis not present

## 2017-05-25 DIAGNOSIS — R7989 Other specified abnormal findings of blood chemistry: Secondary | ICD-10-CM | POA: Diagnosis present

## 2017-05-25 DIAGNOSIS — B952 Enterococcus as the cause of diseases classified elsewhere: Secondary | ICD-10-CM

## 2017-05-25 DIAGNOSIS — Z7901 Long term (current) use of anticoagulants: Secondary | ICD-10-CM | POA: Diagnosis not present

## 2017-05-25 DIAGNOSIS — E785 Hyperlipidemia, unspecified: Secondary | ICD-10-CM | POA: Diagnosis present

## 2017-05-25 DIAGNOSIS — Z7982 Long term (current) use of aspirin: Secondary | ICD-10-CM | POA: Diagnosis not present

## 2017-05-25 DIAGNOSIS — E872 Acidosis: Secondary | ICD-10-CM | POA: Diagnosis present

## 2017-05-25 LAB — COMPREHENSIVE METABOLIC PANEL
ALT: 19 U/L (ref 14–54)
AST: 39 U/L (ref 15–41)
Albumin: 2.9 g/dL — ABNORMAL LOW (ref 3.5–5.0)
Alkaline Phosphatase: 57 U/L (ref 38–126)
Anion gap: 9 (ref 5–15)
BUN: 19 mg/dL (ref 6–20)
CHLORIDE: 103 mmol/L (ref 101–111)
CO2: 23 mmol/L (ref 22–32)
CREATININE: 0.79 mg/dL (ref 0.44–1.00)
Calcium: 8.3 mg/dL — ABNORMAL LOW (ref 8.9–10.3)
GFR calc non Af Amer: >60 mL/min (ref >60)
Glucose, Bld: 138 mg/dL — ABNORMAL HIGH (ref 65–99)
POTASSIUM: 3.2 mmol/L — AB (ref 3.5–5.1)
SODIUM: 135 mmol/L (ref 135–145)
Total Bilirubin: 1.4 mg/dL — ABNORMAL HIGH (ref 0.3–1.2)
Total Protein: 5.6 g/dL — ABNORMAL LOW (ref 6.5–8.1)

## 2017-05-25 LAB — BLOOD CULTURE ID PANEL (REFLEXED)
Acinetobacter baumannii: NOT DETECTED
CANDIDA KRUSEI: NOT DETECTED
CANDIDA PARAPSILOSIS: NOT DETECTED
CANDIDA TROPICALIS: NOT DETECTED
CARBAPENEM RESISTANCE: NOT DETECTED
Candida albicans: NOT DETECTED
Candida glabrata: NOT DETECTED
ENTEROBACTERIACEAE SPECIES: DETECTED — AB
Enterobacter cloacae complex: NOT DETECTED
Enterococcus species: DETECTED — AB
Escherichia coli: DETECTED — AB
HAEMOPHILUS INFLUENZAE: NOT DETECTED
KLEBSIELLA OXYTOCA: NOT DETECTED
Klebsiella pneumoniae: NOT DETECTED
LISTERIA MONOCYTOGENES: NOT DETECTED
Neisseria meningitidis: NOT DETECTED
PROTEUS SPECIES: NOT DETECTED
Pseudomonas aeruginosa: NOT DETECTED
SERRATIA MARCESCENS: NOT DETECTED
Staphylococcus aureus (BCID): NOT DETECTED
Staphylococcus species: NOT DETECTED
Streptococcus agalactiae: NOT DETECTED
Streptococcus pneumoniae: NOT DETECTED
Streptococcus pyogenes: NOT DETECTED
Streptococcus species: NOT DETECTED
Vancomycin resistance: NOT DETECTED

## 2017-05-25 LAB — CBC WITH DIFFERENTIAL/PLATELET
BASOS ABS: 0 10*3/uL (ref 0.0–0.1)
Basophils Relative: 0 %
EOS ABS: 0 10*3/uL (ref 0.0–0.7)
EOS PCT: 0 %
HCT: 36 % (ref 36.0–46.0)
Hemoglobin: 12 g/dL (ref 12.0–15.0)
LYMPHS ABS: 0.8 10*3/uL (ref 0.7–4.0)
Lymphocytes Relative: 9 %
MCH: 30.5 pg (ref 26.0–34.0)
MCHC: 33.3 g/dL (ref 30.0–36.0)
MCV: 91.6 fL (ref 78.0–100.0)
MONO ABS: 0.5 10*3/uL (ref 0.1–1.0)
Monocytes Relative: 5 %
Neutro Abs: 8 10*3/uL — ABNORMAL HIGH (ref 1.7–7.7)
Neutrophils Relative %: 86 %
PLATELETS: 120 10*3/uL — AB (ref 150–400)
RBC: 3.93 MIL/uL (ref 3.87–5.11)
RDW: 14.2 % (ref 11.5–15.5)
WBC: 9.3 10*3/uL (ref 4.0–10.5)

## 2017-05-25 LAB — ECHOCARDIOGRAM COMPLETE
HEIGHTINCHES: 66 in
WEIGHTICAEL: 2740.76 [oz_av]

## 2017-05-25 LAB — LACTIC ACID, PLASMA
LACTIC ACID, VENOUS: 1.6 mmol/L (ref 0.5–1.9)
LACTIC ACID, VENOUS: 1.4 mmol/L (ref 0.5–1.9)

## 2017-05-25 LAB — APTT: APTT: 40 s — AB (ref 24–36)

## 2017-05-25 LAB — PROCALCITONIN: PROCALCITONIN: 1.41 ng/mL

## 2017-05-25 LAB — PROTIME-INR
INR: 1.7
PROTHROMBIN TIME: 19.8 s — AB (ref 11.4–15.2)

## 2017-05-25 MED ORDER — ONDANSETRON HCL 4 MG/2ML IJ SOLN: 4.0000 mg | Freq: Four times a day (QID) | INTRAMUSCULAR | Status: DC | PRN

## 2017-05-25 MED ORDER — ONDANSETRON HCL 4 MG PO TABS: 4.0000 mg | ORAL_TABLET | Freq: Four times a day (QID) | ORAL | Status: DC | PRN

## 2017-05-25 MED ORDER — TIMOLOL MALEATE 0.5 % OP SOLN
1.0000 [drp] | Freq: Every morning | OPHTHALMIC | Status: DC
  Administered 2017-05-25 – 2017-05-28 (×3): 1 [drp] via OPHTHALMIC
  Filled 2017-05-25: qty 5

## 2017-05-25 MED ORDER — RIVAROXABAN 20 MG PO TABS
20.0000 mg | ORAL_TABLET | Freq: Every day | ORAL | Status: DC
  Administered 2017-05-25 – 2017-05-28 (×5): 20 mg via ORAL
  Filled 2017-05-25 (×5): qty 1

## 2017-05-25 MED ORDER — LATANOPROST 0.005 % OP SOLN
1.0000 [drp] | Freq: Every day | OPHTHALMIC | Status: DC
  Administered 2017-05-25 – 2017-05-27 (×4): 1 [drp] via OPHTHALMIC
  Filled 2017-05-25: qty 2.5

## 2017-05-25 MED ORDER — VANCOMYCIN HCL IN DEXTROSE 750-5 MG/150ML-% IV SOLN
750.0000 mg | INTRAVENOUS | Status: DC
  Administered 2017-05-25: 750 mg via INTRAVENOUS
  Filled 2017-05-25: qty 150

## 2017-05-25 MED ORDER — POTASSIUM CHLORIDE CRYS ER 20 MEQ PO TBCR
40.0000 meq | EXTENDED_RELEASE_TABLET | Freq: Once | ORAL | Status: AC
  Administered 2017-05-25: 40 meq via ORAL
  Filled 2017-05-25: qty 2

## 2017-05-25 MED ORDER — SODIUM CHLORIDE 0.9 % IV SOLN
INTRAVENOUS | Status: DC
Start: 2017-05-25 — End: 2017-05-26
  Administered 2017-05-25 – 2017-05-26 (×2): via INTRAVENOUS

## 2017-05-25 MED ORDER — LEVOTHYROXINE SODIUM 88 MCG PO TABS
88.0000 ug | ORAL_TABLET | Freq: Every day | ORAL | Status: DC
  Administered 2017-05-25 – 2017-05-28 (×4): 88 ug via ORAL
  Filled 2017-05-25 (×4): qty 1

## 2017-05-25 MED ORDER — PANTOPRAZOLE SODIUM 40 MG PO TBEC
40.0000 mg | DELAYED_RELEASE_TABLET | Freq: Every day | ORAL | Status: DC
  Administered 2017-05-25 – 2017-05-28 (×3): 40 mg via ORAL
  Filled 2017-05-25 (×4): qty 1

## 2017-05-25 MED ORDER — SODIUM CHLORIDE 0.9 % IV SOLN
INTRAVENOUS | Status: AC
  Administered 2017-05-25 (×2): via INTRAVENOUS

## 2017-05-25 MED ORDER — PIPERACILLIN-TAZOBACTAM 3.375 G IVPB
3.3750 g | Freq: Three times a day (TID) | INTRAVENOUS | Status: DC
  Administered 2017-05-25 – 2017-05-28 (×10): 3.375 g via INTRAVENOUS
  Filled 2017-05-25 (×10): qty 50

## 2017-05-25 MED ORDER — ACETAMINOPHEN 325 MG PO TABS
650.0000 mg | ORAL_TABLET | Freq: Four times a day (QID) | ORAL | Status: DC | PRN
  Administered 2017-05-27: 650 mg via ORAL
  Filled 2017-05-25: qty 2

## 2017-05-25 MED ORDER — ROSUVASTATIN CALCIUM 10 MG PO TABS
10.0000 mg | ORAL_TABLET | ORAL | Status: DC
  Administered 2017-05-26 – 2017-05-28 (×2): 10 mg via ORAL
  Filled 2017-05-25 (×2): qty 1

## 2017-05-25 MED ORDER — ACETAMINOPHEN 650 MG RE SUPP: 650.0000 mg | Freq: Four times a day (QID) | RECTAL | Status: DC | PRN

## 2017-05-25 MED ORDER — SODIUM CHLORIDE 0.9 % IV SOLN: INTRAVENOUS | Status: DC

## 2017-05-25 MED ORDER — ASPIRIN EC 81 MG PO TBEC
81.0000 mg | DELAYED_RELEASE_TABLET | Freq: Every evening | ORAL | Status: DC
  Administered 2017-05-25 – 2017-05-28 (×4): 81 mg via ORAL
  Filled 2017-05-25 (×4): qty 1

## 2017-05-25 NOTE — Progress Notes (Signed)
[facility] PLAN OF CARE NOTE Patient: Beth PiggCharlotte Santiago JYN:[id number]   PCP: [name], [name], MD DOB: 12/24/1924   DOA: 05/24/2017   DOS: 05/25/2017    Patient was admitted by my colleague Dr. Toniann FailKakrakandy  earlier on 05/25/2017. I have reviewed the H&P as well as assessment and plan and agree with the same. Important changes in the plan are listed below.  Plan of care: Enterococcus and E. coli bacteremia. ID consulted appreciate input. Vancomycin discontinued, continue IV Zosyn. Echocardiogram shows preserved EF 55-60%, RV volume overload.  No vegetation. Cardiology will be consulted for TEE.  Author: Lynden OxfordPranav Jamillia Closson, MD [facility] Pager: [phone] 05/25/2017 7:21 PM   If 7PM-7AM, please contact night-coverage at [email or web address], password [email or web address]

## 2017-05-25 NOTE — ED Notes (Signed)
ED TO INPATIENT HANDOFF REPORT  Name/Age/Gender Beth Santiago 84 y.o. female  Code Status Code Status History    Date Active Date Inactive Code Status Order ID Comments User Context   06/18/2016 13:43 06/24/2016 21:13 Full Code [id number]  Elwin Mocha, MD ED      Home/SNF/Other Home  Chief Complaint flu like sx / fever   Level of Care/Admitting Diagnosis ED Disposition    ED Disposition Condition [place]: [place] [[id number]]  Level of Care: Telemetry [5]  Admit to tele based on following criteria: Monitor for Ischemic changes  Diagnosis: SIRS (systemic inflammatory response syndrome) [facility]) [[id number]]  Admitting Physician: Rise Patience [phone]  Attending Physician: Rise Patience Lei.Right  PT Class (Do Not Modify): Observation [104]  PT Acc Code (Do Not Modify): Observation [10022]       Medical History Past Medical History:  Diagnosis Date  . Atrial flutter ([place])    a. Noted during pacemaker implantation 2011, spontaneously terminated.  . Biatrial enlargement    severe  . CAD (coronary artery disease)    a. Nonobstructive by cath 2005 - 40-50% mid LAD. b. Nonischemic nuc 05/2011.  . Carotid artery disease ([place])    a. 1-50% bilateral (upper end) - March 2011.  Marland Kitchen CHF (congestive heart failure) ([place])   . DJD (degenerative joint disease)   . GERD (gastroesophageal reflux disease)   . Hemoptysis 05/2016  . Hyperlipidemia   . Hypertension   . Hypothyroid   . Persistent atrial fibrillation ([place]) 05/2011  . Second degree heart block    a. s/p PPM 2005. b. Gen change to [employer] by Dr Rayann Heman  9/11 for premature ERI 11/2009.    Allergies No Known Allergies  IV Location/Drains/Wounds Patient Lines/Drains/Airways Status   Active Line/Drains/Airways    Name:   Placement date:   Placement time:   Site:   Days:   Peripheral IV 05/24/17 Left Hand   05/24/17    1639    Hand   1   Peripheral IV 05/24/17 Left  Antecubital   05/24/17    1700    Antecubital   1          Labs/Imaging Results for orders placed or performed during the hospital encounter of 05/24/17 (from the past 48 hour(s))  CBC with Differential     Status: Abnormal   Collection Time: 05/24/17  4:54 PM  Result Value Ref Range   WBC 14.1 (H) 4.0 - 10.5 K/uL   RBC 4.09 3.87 - 5.11 MIL/uL   Hemoglobin 12.9 12.0 - 15.0 g/dL   HCT 37.1 36.0 - 46.0 %   MCV 90.7 78.0 - 100.0 fL   MCH 31.5 26.0 - 34.0 pg   MCHC 34.8 30.0 - 36.0 g/dL   RDW 14.4 11.5 - 15.5 %   Platelets 222 150 - 400 K/uL   Neutrophils Relative % 92 %   Neutro Abs 13.0 (H) 1.7 - 7.7 K/uL   Lymphocytes Relative 4 %   Lymphs Abs 0.5 (L) 0.7 - 4.0 K/uL   Monocytes Relative 4 %   Monocytes Absolute 0.6 0.1 - 1.0 K/uL   Eosinophils Relative 0 %   Eosinophils Absolute 0.0 0.0 - 0.7 K/uL   Basophils Relative 0 %   Basophils Absolute 0.0 0.0 - 0.1 K/uL    Comment: Performed at [facility], [place] [address], [place], [place] [address]  I-stat troponin, ED     Status:  None   Collection Time: 05/24/17  5:18 PM  Result Value Ref Range   Troponin i, poc 0.02 0.00 - 0.08 ng/mL   Comment 3            Comment: Due to the release kinetics of cTnI, a negative result within the first hours of the onset of symptoms does not rule out myocardial infarction with certainty. If myocardial infarction is still suspected, repeat the test at appropriate intervals.   I-Stat CG4 Lactic Acid, ED     Status: Abnormal   Collection Time: 05/24/17  5:20 PM  Result Value Ref Range   Lactic Acid, Venous 3.29 (HH) 0.5 - 1.9 mmol/L   Comment NOTIFIED PHYSICIAN   Comprehensive metabolic panel     Status: Abnormal   Collection Time: 05/24/17  6:39 PM  Result Value Ref Range   Sodium 135 135 - 145 mmol/L   Potassium 3.9 3.5 - 5.1 mmol/L   Chloride 104 101 - 111 mmol/L   CO2 22 22 - 32 mmol/L   Glucose, Bld 169 (H) 65 - 99 mg/dL   BUN 19 6 - 20 mg/dL   Creatinine, Ser  0.80 0.44 - 1.00 mg/dL   Calcium 8.1 (L) 8.9 - 10.3 mg/dL   Total Protein 5.8 (L) 6.5 - 8.1 g/dL   Albumin 3.0 (L) 3.5 - 5.0 g/dL   AST 57 (H) 15 - 41 U/L   ALT 12 (L) 14 - 54 U/L   Alkaline Phosphatase 57 38 - 126 U/L   Total Bilirubin 1.9 (H) 0.3 - 1.2 mg/dL   GFR calc non Af Amer >60 >60 mL/min   GFR calc Af Amer >60 >60 mL/min    Comment: (NOTE) The eGFR has been calculated using the CKD EPI equation. This calculation has not been validated in all clinical situations. eGFR's persistently <60 mL/min signify possible Chronic Kidney Disease.    Anion gap 9 5 - 15    Comment: Performed at [facility], [place] [address] [place], [place] [address]  Influenza panel by PCR (type A & B)     Status: None   Collection Time: 05/24/17  7:31 PM  Result Value Ref Range   Influenza A By PCR NEGATIVE NEGATIVE   Influenza B By PCR NEGATIVE NEGATIVE    Comment: (NOTE) The Xpert Xpress Flu assay is intended as an aid in the diagnosis of  influenza and should not be used as a sole basis for treatment.  This  assay is FDA approved for nasopharyngeal swab specimens only. Nasal  washings and aspirates are unacceptable for Xpert Xpress Flu testing. Performed at [facility], [place] [address], [place], [place] [address]   I-Stat CG4 Lactic Acid, ED     Status: Abnormal   Collection Time: 05/24/17  7:45 PM  Result Value Ref Range   Lactic Acid, Venous 2.02 (HH) 0.5 - 1.9 mmol/L   Comment NOTIFIED PHYSICIAN   Urinalysis, Routine w reflex microscopic     Status: Abnormal   Collection Time: 05/24/17  9:57 PM  Result Value Ref Range   Color, Urine AMBER (A) YELLOW    Comment: BIOCHEMICALS MAY BE AFFECTED BY COLOR   APPearance CLEAR CLEAR   Specific Gravity, Urine 1.020 1.005 - 1.030   pH 5.0 5.0 - 8.0   Glucose, UA NEGATIVE NEGATIVE mg/dL   Hgb urine dipstick SMALL (A) NEGATIVE   Bilirubin Urine NEGATIVE NEGATIVE   Ketones, ur NEGATIVE NEGATIVE mg/dL   Protein,  ur NEGATIVE NEGATIVE mg/dL   Nitrite NEGATIVE NEGATIVE   Leukocytes, UA TRACE (A) NEGATIVE   RBC / HPF 0-5 0 - 5 RBC/hpf   WBC, UA 0-5 0 - 5 WBC/hpf   Bacteria, UA NONE SEEN NONE SEEN   Squamous Epithelial / LPF 0-5 (A) NONE SEEN   Mucus PRESENT    Hyaline Casts, UA PRESENT     Comment: Performed at [facility], [place] [address], [place], [place] [address]   Dg Chest Port 1 View  Result Date: 05/24/2017 CLINICAL DATA:  Short of breath last night with fever today. EXAM: PORTABLE CHEST 1 VIEW COMPARISON:  06/21/2016 FINDINGS: Cardiac silhouette is mildly enlarged. No mediastinal or hilar masses. No evidence of adenopathy. There are fine reticular type opacities most evident at the peripheral right upper lobe. Lungs otherwise clear, with no evidence of pneumonia or pulmonary edema. No pleural effusion or pneumothorax. Left anterior chest wall pacemaker is stable. Skeletal structures are demineralized but grossly intact. IMPRESSION: No acute cardiopulmonary disease. Electronically Signed   By: Lajean Manes M.D.   On: 05/24/2017 17:17    Pending Labs Unresulted Labs (From admission, onward)   Start     Ordered   05/24/17 1654  Culture, blood (routine x 2)  BLOOD CULTURE X 2,   STAT     05/24/17 1654      Vitals/Pain Today's Vitals   05/24/17 2305 05/25/17 0000 05/25/17 0045 05/25/17 0047  BP: (!) 74/57 99/61    Pulse: 75 62 (!) 59   Resp: (!) 24  16   Temp:      TempSrc:      SpO2: 96% 96% 95%   PainSc:    0-No pain    Isolation Precautions No active isolations  Medications Medications  sodium chloride 0.9 % bolus 500 mL (0 mLs Intravenous Stopped 05/24/17 1756)  sodium chloride 0.9 % bolus 500 mL (0 mLs Intravenous Stopped 05/24/17 1756)  piperacillin-tazobactam (ZOSYN) IVPB 3.375 g (0 g Intravenous Stopped 05/24/17 2008)  0.9 %  sodium chloride infusion ( Intravenous New Bag/Given 05/24/17 1938)  vancomycin (VANCOCIN) IVPB 1000 mg/200 mL premix (0 mg Intravenous  Stopped 05/25/17 0000)    Mobility walks with device

## 2017-05-25 NOTE — Progress Notes (Signed)
Pharmacy Antibiotic Note  Beth PiggCharlotte Demeo is a 84 y.o. female admitted on 05/24/2017 with sepsis.  Pharmacy has been consulted for zosyn and vancomycin dosing.  Plan: Zosyn 3.375g IV q8h (4 hour infusion).  Vancomycin 1 Gm x1 then 750 mg IV q24h for est AUC=524 Goal AUC = 400-500 Daily Scr F/u cultures/levels  Height: 5\' 6"  (167.6 cm) Weight: 171 lb 4.8 oz (77.7 kg) IBW/kg (Calculated) : 59.3  Temp (24hrs), Avg:98.7 F (37.1 C), Min:97.9 F (36.6 C), Max:99.2 F (37.3 C)  Recent Labs  Lab 05/24/17 1654 05/24/17 1720 05/24/17 1839 05/24/17 1945  WBC 14.1*  --   --   --   CREATININE  --   --  0.80  --   LATICACIDVEN  --  3.29*  --  2.02*    Estimated Creatinine Clearance: 49.2 mL/min (by C-G formula based on SCr of 0.8 mg/dL).    No Known Allergies  Antimicrobials this admission: 3/4 zosyn >>  3/4 vancomycin >>   Dose adjustments this admission:   Microbiology results:  BCx:   UCx:    Sputum:    MRSA PCR:   Thank you for allowing pharmacy to be a part of this patient's care.  Lorenza EvangelistGreen, Kyro Joswick R 05/25/2017 1:59 AM

## 2017-05-25 NOTE — Plan of Care (Signed)
  Progressing Education: Knowledge of General Education information will improve 05/25/2017 2250 - Progressing by Cristela FeltSteffens, Velvet Moomaw P, RN Health Behavior/Discharge Planning: Ability to manage health-related needs will improve 05/25/2017 2250 - Progressing by Cristela FeltSteffens, Alyce Inscore P, RN Clinical Measurements: Ability to maintain clinical measurements within normal limits will improve 05/25/2017 2250 - Progressing by Cristela FeltSteffens, Jaziah Goeller P, RN Will remain free from infection 05/25/2017 2250 - Progressing by Cristela FeltSteffens, Inanna Telford P, RN Diagnostic test results will improve 05/25/2017 2250 - Progressing by Cristela FeltSteffens, Lynsey Ange P, RN Respiratory complications will improve 05/25/2017 2250 - Progressing by Cristela FeltSteffens, Omauri Boeve P, RN Cardiovascular complication will be avoided 05/25/2017 2250 - Progressing by Cristela FeltSteffens, Christ Fullenwider P, RN Activity: Risk for activity intolerance will decrease 05/25/2017 2250 - Progressing by Cristela FeltSteffens, Valeria Krisko P, RN Nutrition: Adequate nutrition will be maintained 05/25/2017 2250 - Progressing by Cristela FeltSteffens, Philbert Ocallaghan P, RN Coping: Level of anxiety will decrease 05/25/2017 2250 - Progressing by Cristela FeltSteffens, Bartow Zylstra P, RN Elimination: Will not experience complications related to urinary retention 05/25/2017 2250 - Progressing by Cristela FeltSteffens, Jovana Rembold P, RN Pain Managment: General experience of comfort will improve 05/25/2017 2250 - Progressing by Cristela FeltSteffens, Lundon Rosier P, RN Safety: Ability to remain free from injury will improve 05/25/2017 2250 - Progressing by Cristela FeltSteffens, Arletta Lumadue P, RN Skin Integrity: Risk for impaired skin integrity will decrease 05/25/2017 2250 - Progressing by Cristela FeltSteffens, Chavie Kolinski P, RN

## 2017-05-25 NOTE — Evaluation (Signed)
Physical Therapy Evaluation Patient Details Name: Beth Santiago MRN: [id number] DOB: 12/27/1924 Today's Date: 05/25/2017   History of Present Illness  Beth Santiago is a 84 y.o. female with history of atrial fibrillation, diastolic CHF, hypothyroidism, complete heart block status post pacemaker placement, hypertension was brought to the ER after patient has been feeling weak for last 24 hours.  Patient symptoms started 2 days ago after visiting discharge.  Patient started having fever chills and felt weak and had to lie on the bed.  Since then patient is feeling weak to stand up.  Has been having some cough denies any chest pain or shortness of breath nausea vomiting abdominal pain.  Clinical Impression  Pt presents with dependencies in mobility affecting her Independence secondary to the above diagnosis. Pt presents with general deconditioning and SOB with activity. Pt was able to ambulate in the hall approximately 120 ft with min assist. Pt's daughter lives with her and is able to help as needed. Recommend continued acute skilled PT to maximize mobility and Independence for return home with family.     Follow Up Recommendations Home health PT    Equipment Recommendations  None recommended by PT    Recommendations for Other Services       Precautions / Restrictions Precautions Precautions: Fall Restrictions Weight Bearing Restrictions: No      Mobility  Bed Mobility Overal bed mobility: Needs Assistance Bed Mobility: Supine to Sit     Supine to sit: Supervision     General bed mobility comments: HOB elevated, use of rails  Transfers Overall transfer level: Needs assistance Equipment used: Rolling walker (2 wheeled) Transfers: Sit to/from Stand Sit to Stand: Min assist         General transfer comment: cues for hand placement  Ambulation/Gait Ambulation/Gait assistance: Min assist Ambulation Distance (Feet): 120 Feet Assistive device: Rolling walker (2  wheeled) Gait Pattern/deviations: Step-through pattern;Decreased stride length;Trunk flexed Gait velocity: decreased      Stairs            Wheelchair Mobility    Modified Rankin (Stroke Patients Only)       Balance Overall balance assessment: Needs assistance Sitting-balance support: No upper extremity supported;Feet supported Sitting balance-Leahy Scale: Good     Standing balance support: Bilateral upper extremity supported Standing balance-Leahy Scale: Fair                               Pertinent Vitals/Pain Pain Assessment: No/denies pain    Home Living Family/patient expects to be discharged to:: Private residence Living Arrangements: Children Available Help at Discharge: Family;Available 24 hours/day Type of Home: House Home Access: Stairs to enter     Home Layout: One level Home Equipment: Environmental consultantWalker - 2 wheels      Prior Function Level of Independence: Independent with assistive device(s)               Hand Dominance        Extremity/Trunk Assessment        Lower Extremity Assessment Lower Extremity Assessment: Overall WFL for tasks assessed       Communication   Communication: HOH  Cognition Arousal/Alertness: Awake/alert Behavior During Therapy: WFL for tasks assessed/performed Overall Cognitive Status: Within Functional Limits for tasks assessed  General Comments General comments (skin integrity, edema, etc.): Pt's daughter was present during session. She lives with her mother and reports she was previously able to walk to the BR and throughout the house with RW mod. Independnet.  Pt SOB and some wheezing noted after ambulation. O2 sats 96% on RA.    Exercises     Assessment/Plan    PT Assessment Patient needs continued PT services  PT Problem List Decreased strength;Decreased mobility;Decreased safety awareness;Decreased knowledge of precautions;Decreased  activity tolerance;Cardiopulmonary status limiting activity;Decreased balance       PT Treatment Interventions DME instruction;Therapeutic activities;Gait training;Therapeutic exercise;Patient/family education;Stair training;Balance training;Functional mobility training    PT Goals (Current goals can be found in the Care Plan section)  Acute Rehab PT Goals Patient Stated Goal: To return to prior level of fubction. PT Goal Formulation: With patient Time For Goal Achievement: 06/08/17 Potential to Achieve Goals: Good    Frequency Min 3X/week   Barriers to discharge        Co-evaluation               AM-PAC PT "6 Clicks" Daily Activity  Outcome Measure Difficulty turning over in bed (including adjusting bedclothes, sheets and blankets)?: A Little Difficulty moving from lying on back to sitting on the side of the bed? : A Little Difficulty sitting down on and standing up from a chair with arms (e.g., wheelchair, bedside commode, etc,.)?: A Little Help needed moving to and from a bed to chair (including a wheelchair)?: A Little Help needed walking in hospital room?: A Little Help needed climbing 3-5 steps with a railing? : A Lot 6 Click Score: 17    End of Session Equipment Utilized During Treatment: Gait belt Activity Tolerance: Patient limited by fatigue Patient left: in bed;with call bell/phone within reach;with family/visitor present Nurse Communication: Mobility status PT Visit Diagnosis: Muscle weakness (generalized) (M62.81);Difficulty in walking, not elsewhere classified (R26.2)    Time: 1610-9604 PT Time Calculation (min) (ACUTE ONLY): 37 min   Charges:   PT Evaluation $PT Eval Moderate Complexity: 1 Mod PT Treatments $Gait Training: 8-22 mins   PT G Codes:        Lilyan Punt, PT  Greggory Stallion 05/25/2017, 12:47 PM

## 2017-05-25 NOTE — Progress Notes (Signed)
  Echocardiogram 2D Echocardiogram has been performed.  Beth Santiago 05/25/2017, 2:48 PM

## 2017-05-25 NOTE — Progress Notes (Signed)
PHARMACY - PHYSICIAN COMMUNICATION CRITICAL VALUE ALERT - BLOOD CULTURE IDENTIFICATION (BCID)  Beth PiggCharlotte Santiago is an 84 y.o. female who [facility]2017 with a chief complaint of sepsis   Name of physician (or Provider) Contacted: Dr. Allena KatzPatel  Current antibiotics: vancomycin and zosyn  Changes to prescribed antibiotics recommended:  none  Results for orders placed or performed during the hospital encounter of 05/24/17  Blood Culture ID Panel (Reflexed) (Collected: 05/24/2017  4:54 PM)  Result Value Ref Range   Enterococcus species DETECTED (A) NOT DETECTED   Vancomycin resistance NOT DETECTED NOT DETECTED   Listeria monocytogenes NOT DETECTED NOT DETECTED   Staphylococcus species NOT DETECTED NOT DETECTED   Staphylococcus aureus NOT DETECTED NOT DETECTED   Streptococcus species NOT DETECTED NOT DETECTED   Streptococcus agalactiae NOT DETECTED NOT DETECTED   Streptococcus pneumoniae NOT DETECTED NOT DETECTED   Streptococcus pyogenes NOT DETECTED NOT DETECTED   Acinetobacter baumannii NOT DETECTED NOT DETECTED   Enterobacteriaceae species DETECTED (A) NOT DETECTED   Enterobacter cloacae complex NOT DETECTED NOT DETECTED   Escherichia coli DETECTED (A) NOT DETECTED   Klebsiella oxytoca NOT DETECTED NOT DETECTED   Klebsiella pneumoniae NOT DETECTED NOT DETECTED   Proteus species NOT DETECTED NOT DETECTED   Serratia marcescens NOT DETECTED NOT DETECTED   Carbapenem resistance NOT DETECTED NOT DETECTED   Haemophilus influenzae NOT DETECTED NOT DETECTED   Neisseria meningitidis NOT DETECTED NOT DETECTED   Pseudomonas aeruginosa NOT DETECTED NOT DETECTED   Candida albicans NOT DETECTED NOT DETECTED   Candida glabrata NOT DETECTED NOT DETECTED   Candida krusei NOT DETECTED NOT DETECTED   Candida parapsilosis NOT DETECTED NOT DETECTED   Candida tropicalis NOT DETECTED NOT DETECTED    Maurice MarchJackson, Blandon Offerdahl E 05/25/2017  10:25 AM

## 2017-05-25 NOTE — H&P (Addendum)
History and Physical    Beth Santiago WUJ:[id number] DOB: [birth year] DOA: 05/24/2017  PCP: Adrian Prince, MD  Patient coming from:.  Home.  Chief Complaint: Fever chills and weakness.  HPI: Beth Santiago is a 84 y.o. female with history of atrial fibrillation, diastolic CHF, hypothyroidism, complete heart block status post pacemaker placement, hypertension was brought to the ER after patient has been feeling weak for last 24 hours.  Patient symptoms started 2 days ago after visiting discharge.  Patient started having fever chills and felt weak and had to lie on the bed.  Since then patient is feeling weak to stand up.  Has been having some cough denies any chest pain or shortness of breath nausea vomiting abdominal pain.  Since patient's weakness and fever chills persistent patient came to the ER as per the daughter patient recorded a fever of unknown 100 F at home.  ED Course: The ER patient was found to be hypotensive with blood pressure in the 80s.  Lactate was high around 3.  Blood work also showed leukocytosis of 14,000.  Chest x-ray UA and flu panel were negative.  Blood cultures were obtained and started on fluid bolus and admitted for SIRS source not clear.  Patient was admitted last year in April for sepsis from pneumonia.  Review of Systems: As per HPI, rest all negative.   Past Medical History:  Diagnosis Date  . Atrial flutter (HCC)    a. Noted during pacemaker implantation 2011, spontaneously terminated.  . Biatrial enlargement    severe  . CAD (coronary artery disease)    a. Nonobstructive by cath 2005 - 40-50% mid LAD. b. Nonischemic nuc 05/2011.  . Carotid artery disease (HCC)    a. 1-50% bilateral (upper end) - March 2011.  Marland Kitchen CHF (congestive heart failure) (HCC)   . DJD (degenerative joint disease)   . GERD (gastroesophageal reflux disease)   . Hemoptysis 05/2016  . Hyperlipidemia   . Hypertension   . Hypothyroid   . Persistent atrial fibrillation (HCC)  05/2011  . Second degree heart block    a. s/p PPM 2005. b. Gen change to [employer] by Dr Johney Frame  9/11 for premature ERI 11/2009.    Past Surgical History:  Procedure Laterality Date  . ABDOMINAL SURGERY     twisted bowel  . CARDIOVERSION  06/11/2011   Procedure: CARDIOVERSION;  Surgeon: Dolores Patty, MD;  Location: [facility] ENDOSCOPY;  Service: Cardiovascular;  Laterality: N/A;  . CARDIOVERSION N/A 01/04/2013   Procedure: CARDIOVERSION;  Surgeon: Lars Masson, MD;  Location: [facility] ENDOSCOPY;  Service: Cardiovascular;  Laterality: N/A;  . COLON SURGERY    . HEMORROIDECTOMY    . PACEMAKER INSERTION  2005, 12/11/09   implanted by Dr Reyes Ivan, generator change 9/11 by Fawn Kirk for premature ERI  . TEE WITHOUT CARDIOVERSION  06/11/2011   Procedure: TRANSESOPHAGEAL ECHOCARDIOGRAM (TEE);  Surgeon: Dolores Patty, MD;  Location: [facility] ENDOSCOPY;  Service: Cardiovascular;  Laterality: N/A;  . TONSILLECTOMY       reports that  has never smoked. she has never used smokeless tobacco. She reports that she drinks alcohol. She reports that she does not use drugs.  No Known Allergies  Family History  Problem Relation Age of Onset  . Cancer Mother   . Diabetes Mother   . Hypertension Mother   . Heart disease Father   . Cancer Brother   . Heart disease Brother     Prior to Admission medications   Medication Sig Start Date  End Date Taking? Authorizing Provider  amLODipine (NORVASC) 5 MG tablet Take 1 tablet (5 mg total) by mouth daily. 10/26/16  Yes Allred, Fayrene Fearing, MD  BYSTOLIC 5 MG tablet TAKE 1/2 TABLET BY MOUTH DAILY Patient taking differently: TAKE 1/2 TABLET (2.5 MG) BY MOUTH DAILY 08/13/16  Yes Allred, Fayrene Fearing, MD  co-enzyme Q-10 30 MG capsule Take 30 mg by mouth daily.   Yes [provider]  fish oil-omega-3 fatty acids 1000 MG capsule Take 2 g daily by mouth.    Yes [provider]  furosemide (LASIX) 40 MG tablet Take 1 tablet (40 mg total) by mouth daily. May take an extra tablet  for wt gain of 3 lbs overnight or 5lbs in a week. 04/15/17  Yes Bensimhon, Bevelyn Buckles, MD  latanoprost (XALATAN) 0.005 % ophthalmic solution Place 1 drop into both eyes at bedtime.     Yes [provider]  levothyroxine (SYNTHROID, LEVOTHROID) 88 MCG tablet Take 1 mcg by mouth daily.  03/13/14  Yes [provider]  Misc Natural Products (OSTEO BI-FLEX JOINT SHIELD PO) Take 1 tablet by mouth daily.   Yes [provider]  Misc Natural Products (TART CHERRY ADVANCED) CAPS Take 1 capsule by mouth daily.   Yes [provider]  Multiple Vitamins-Minerals (CENTRUM SILVER PO) Take 1 tablet by mouth daily.     Yes [provider]  omeprazole (PRILOSEC) 20 MG capsule Take 20 mg by mouth 2 (two) times daily before a meal. Take once or twice a day   Yes [provider]  potassium chloride SA (K-DUR,KLOR-CON) 20 MEQ tablet Take 1 tablet (20 mEq total) by mouth daily. Take 1 extra tab when you take extra Lasix 10/26/16  Yes Bensimhon, Bevelyn Buckles, MD  timolol (BETIMOL) 0.5 % ophthalmic solution Place 1 drop into both eyes every morning.    Yes [provider]  acetaminophen (TYLENOL) 325 MG tablet Take 2 tablets (650 mg total) by mouth every 6 (six) hours as needed for mild pain (or Fever >/= 101). 06/23/16   Alison Murray, MD  aspirin EC 81 MG tablet Take 81 mg by mouth every evening.     [provider]  calcium carbonate (TUMS - DOSED IN MG ELEMENTAL CALCIUM) 500 MG chewable tablet Chew 1 tablet (200 mg of elemental calcium total) by mouth as needed for indigestion or heartburn. Patient not taking: Reported on 05/24/2017 06/24/16   Rodolph Bong, MD  furosemide (LASIX) 40 MG tablet TAKE 1 TABLET BY MOUTH DAILY Patient not taking: Reported on 05/24/2017 04/15/17   Bensimhon, Bevelyn Buckles, MD  rosuvastatin (CRESTOR) 20 MG tablet Take 10 mg by mouth 3 (three) times a week. Andris Flurry and Sat    [provider]  XARELTO 20 MG TABS tablet TAKE 1 TABLET  BY MOUTH DAILY WITH SUPPER 11/24/16   Bensimhon, Bevelyn Buckles, MD  hydrochlorothiazide (MICROZIDE) 12.5 MG capsule Take 12.5 mg by mouth daily.    06/08/11  [provider]  olmesartan (BENICAR) 40 MG tablet Take 40 mg by mouth daily.    06/08/11  [provider]    Physical Exam: Vitals:   05/24/17 2305 05/25/17 0000 05/25/17 0045 05/25/17 0109  BP: (!) 74/57 99/61  (!) 111/97  Pulse: 75 62 (!) 59 75  Resp: (!) 24  16 (!) 22  Temp:    97.9 F (36.6 C)  TempSrc:    Oral  SpO2: 96% 96% 95% 96%  Weight:    77.7 kg (  171 lb 4.8 oz)  Height:    5\' 6"  (1.676 m)      Constitutional: Moderately built and nourished. Vitals:   05/24/17 2305 05/25/17 0000 05/25/17 0045 05/25/17 0109  BP: (!) 74/57 99/61  (!) 111/97  Pulse: 75 62 (!) 59 75  Resp: (!) 24  16 (!) 22  Temp:    97.9 F (36.6 C)  TempSrc:    Oral  SpO2: 96% 96% 95% 96%  Weight:    77.7 kg (171 lb 4.8 oz)  Height:    5\' 6"  (1.676 m)   Eyes: Anicteric no pallor. ENMT: No discharge from the ears eyes nose or mouth. Neck: No mass felt.  No neck rigidity. Respiratory: No rhonchi or crepitations. Cardiovascular: S1-S2 heard no murmurs appreciated. Abdomen: Soft nontender bowel sounds present. Musculoskeletal: No edema.  No joint effusion. Skin: No rash. Neurologic: Alert awake oriented to time place and person.  Moves all extremities. Psychiatric: Appears normal.  Normal affect.   Labs on Admission: I have personally reviewed following labs and imaging studies  CBC: Recent Labs  Lab 05/24/17 1654  WBC 14.1*  NEUTROABS 13.0*  HGB 12.9  HCT 37.1  MCV 90.7  PLT 222   Basic Metabolic Panel: Recent Labs  Lab 05/24/17 1839  NA 135  K 3.9  CL 104  CO2 22  GLUCOSE 169*  BUN 19  CREATININE 0.80  CALCIUM 8.1*   GFR: Estimated Creatinine Clearance: 49.2 mL/min (by C-G formula based on SCr of 0.8 mg/dL). Liver Function Tests: Recent Labs  Lab 05/24/17 1839  AST 57*  ALT 12*  ALKPHOS 57  BILITOT  1.9*  PROT 5.8*  ALBUMIN 3.0*   No results for input(s): LIPASE, AMYLASE in the last 168 hours. No results for input(s): AMMONIA in the last 168 hours. Coagulation Profile: No results for input(s): INR, PROTIME in the last 168 hours. Cardiac Enzymes: No results for input(s): CKTOTAL, CKMB, CKMBINDEX, TROPONINI in the last 168 hours. BNP (last 3 results) No results for input(s): PROBNP in the last 8760 hours. HbA1C: No results for input(s): HGBA1C in the last 72 hours. CBG: No results for input(s): GLUCAP in the last 168 hours. Lipid Profile: No results for input(s): CHOL, HDL, LDLCALC, TRIG, CHOLHDL, LDLDIRECT in the last 72 hours. Thyroid Function Tests: No results for input(s): TSH, T4TOTAL, FREET4, T3FREE, THYROIDAB in the last 72 hours. Anemia Panel: No results for input(s): VITAMINB12, FOLATE, FERRITIN, TIBC, IRON, RETICCTPCT in the last 72 hours. Urine analysis:    Component Value Date/Time   COLORURINE AMBER (A) 05/24/2017 2157   APPEARANCEUR CLEAR 05/24/2017 2157   LABSPEC 1.020 05/24/2017 2157   PHURINE 5.0 05/24/2017 2157   GLUCOSEU NEGATIVE 05/24/2017 2157   HGBUR SMALL (A) 05/24/2017 2157   BILIRUBINUR NEGATIVE 05/24/2017 2157   KETONESUR NEGATIVE 05/24/2017 2157   PROTEINUR NEGATIVE 05/24/2017 2157   UROBILINOGEN 0.2 12/09/2012 1401   NITRITE NEGATIVE 05/24/2017 2157   LEUKOCYTESUR TRACE (A) 05/24/2017 2157   Sepsis Labs: @LABRCNTIP (procalcitonin:4,lacticidven:4) )No results found for this or any previous visit (from the past 240 hour(s)).   Radiological Exams on Admission: Dg Chest Port 1 View  Result Date: 05/24/2017 CLINICAL DATA:  Short of breath last night with fever today. EXAM: PORTABLE CHEST 1 VIEW COMPARISON:  06/21/2016 FINDINGS: Cardiac silhouette is mildly enlarged. No mediastinal or hilar masses. No evidence of adenopathy. There are fine reticular type opacities most evident at the peripheral right upper lobe. Lungs otherwise clear, with no  evidence of pneumonia  or pulmonary edema. No pleural effusion or pneumothorax. Left anterior chest wall pacemaker is stable. Skeletal structures are demineralized but grossly intact. IMPRESSION: No acute cardiopulmonary disease. Electronically Signed   By: Amie Portlandavid  Ormond M.D.   On: 05/24/2017 17:17    EKG: Independently reviewed.  Paced rhythm.  Assessment/Plan Principal Problem:   SIRS (systemic inflammatory response syndrome) (HCC) Active Problems:   HYPERTENSION, BENIGN   CAD, NATIVE VESSEL   PACEMAKER-[employer]tronic   Atrial fibrillation (HCC)   Chronic diastolic CHF (congestive heart failure) (HCC)    1. SIRS -source not clear.  Follow blood cultures and urine cultures.  For now patient is on empiric antibiotics.  Continue to hydrate and since patient has CHF closely observe respiratory status.  Follow lactate levels pro calcitonin levels.  We will also check troponin. 2. History of diastolic CHF -last EF measured in March 2018 was 55-60%.  Patient's Lasix will be held due to hypotension and SIRS physiology. 3. Atrial fibrillation on Xarelto which will be continued.  For now holding off Bystolic due to hypotension. 4. History of hypertension holding ARB amlodipine and Bystolic due to hypotension. 5. Hypothyroidism on Synthroid. 6. Complete heart block status post pacemaker placement.   DVT prophylaxis: Xarelto. Code Status: Full code. Family Communication: Discussed with patient's daughter. Disposition Plan: Home. Consults called: None. Admission status: Inpatient.   Eduard ClosArshad N Everson Mott MD [facility] Pager [phone].  If 7PM-7AM, please contact night-coverage [email or web address] Password [email or web address]  05/25/2017, 1:54 AM

## 2017-05-25 NOTE — Consult Note (Signed)
[facility]    Date of Admission:  05/24/2017   Total days of antibiotics: 1 vanco/zosyn               Reason for Consult: Enterococcal bacteremia    Referring Provider: CHAMP   Assessment: Sepsis Enterococcal bacteremia GNR bacteremia Pacemaker  Plan: 1. Stop vanco 2. Continue zosyn 3. Consider TEE (some hesitancy given her age) 35. check TTE 5. Repeat BCx  Comment- Explained to pt that she is doing better but I cannot explain how she got bacteremic yet. Her urine is unremarkable and she has a (-) CXR.   Thank you so much for this interesting consult,  Principal Problem:   SIRS (systemic inflammatory response syndrome) (HCC) Active Problems:   HYPERTENSION, BENIGN   CAD, NATIVE VESSEL   PACEMAKER-Medtronic   Atrial fibrillation (HCC)   Chronic diastolic CHF (congestive heart failure) ([place])   . aspirin EC  81 mg Oral QPM  . latanoprost  1 drop Both Eyes QHS  . levothyroxine  88 mcg Oral Daily  . pantoprazole  40 mg Oral Daily  . rivaroxaban  20 mg Oral Q supper  . [START ON 05/26/2017] rosuvastatin  10 mg Oral Once per day on Mon Wed Fri  . timolol  1 drop Both Eyes q morning - 10a    HPI: Beth Santiago is a 84 y.o. female with hx of afib, diastolic, CHF, pacemaker placement 2011, HTN, came to ED on 3-4 with 2 days of weakness, fever and chills. In ED she was hypotensive (74/57), had elevated lactate (3.29). Her WBC was 14k, her CXR was (-). Her UA was unremarkable (0-5 WBC/RBC/SE) She was started on vanco/zosyn.   Review of Systems: Review of Systems  Constitutional: Positive for chills, fever and malaise/fatigue.  HENT: Negative for sore throat.   Respiratory: Negative for cough and shortness of breath.   Cardiovascular: Negative for chest pain and leg swelling.  Gastrointestinal: Negative for constipation and diarrhea.  Genitourinary: Negative for dysuria and hematuria.  she denies recent manipulation of her pacer.  No pain at pacer.  The past medical history, family history and social history were reviewed/updated in EPIC   Past Medical History:  Diagnosis Date  . Atrial flutter ([place])    a. Noted during pacemaker implantation 2011, spontaneously terminated.  . Biatrial enlargement    severe  . CAD (coronary artery disease)    a. Nonobstructive by cath 2005 - 40-50% mid LAD. b. Nonischemic nuc 05/2011.  . Carotid artery disease ([place])    a. 1-50% bilateral (upper end) - March 2011.  Marland Kitchen CHF (congestive heart failure) ([place])   . DJD (degenerative joint disease)   . GERD (gastroesophageal reflux disease)   . Hemoptysis 05/2016  . Hyperlipidemia   . Hypertension   . Hypothyroid   . Persistent atrial fibrillation ([place]) 05/2011  . Second degree heart block    a. s/p PPM 2005. b. Gen change to Medtronic by Dr Rayann Heman  9/11 for premature ERI 11/2009.    Social History   Tobacco Use  . Smoking status: Never Smoker  . Smokeless tobacco: Never Used  Substance Use Topics  . Alcohol use: Yes    Comment: 05/2016    OCCASIONAL   . Drug use: No    Family History  Problem Relation Age of Onset  . Cancer Mother   . Diabetes Mother   . Hypertension Mother   . Heart disease Father   .  Cancer Brother   . Heart disease Brother      Medications:  Scheduled: . aspirin EC  81 mg Oral QPM  . latanoprost  1 drop Both Eyes QHS  . levothyroxine  88 mcg Oral Daily  . pantoprazole  40 mg Oral Daily  . rivaroxaban  20 mg Oral Q supper  . [START ON 05/26/2017] rosuvastatin  10 mg Oral Once per day on Mon Wed Fri  . timolol  1 drop Both Eyes q morning - 10a    Abtx:  Anti-infectives (From admission, onward)   Start     Dose/Rate Route Frequency Ordered Stop   05/25/17 1400  vancomycin (VANCOCIN) IVPB 750 mg/150 ml premix     750 mg 150 mL/hr over 60 Minutes Intravenous Every 24 hours 05/25/17 0212     05/25/17 0300  piperacillin-tazobactam (ZOSYN) IVPB 3.375 g     3.375 g 12.5 mL/hr over 240 Minutes  Intravenous Every 8 hours 05/25/17 0212     05/24/17 2300  vancomycin (VANCOCIN) IVPB 1000 mg/200 mL premix     1,000 mg 200 mL/hr over 60 Minutes Intravenous  Once 05/24/17 2255 05/25/17 0000   05/24/17 1845  piperacillin-tazobactam (ZOSYN) IVPB 3.375 g     3.375 g 100 mL/hr over 30 Minutes Intravenous  Once 05/24/17 1834 05/24/17 2008        OBJECTIVE: Blood pressure 123/65, pulse 62, temperature 98 F (36.7 C), temperature source Oral, resp. rate 20, height 5' 6"  (1.676 m), weight 77.7 kg (171 lb 4.8 oz), SpO2 97 %.  Physical Exam  Constitutional: She is oriented to person, place, and time and well-developed, well-nourished, and in no distress.  HENT:  Mouth/Throat: No oropharyngeal exudate.  Eyes: EOM are normal. Pupils are equal, round, and reactive to light.  Neck: Neck supple.  Cardiovascular: Normal rate, regular rhythm and normal heart sounds.  Pulmonary/Chest: Effort normal and breath sounds normal.    Abdominal: Soft. Bowel sounds are normal. There is no tenderness. There is no rebound.  Musculoskeletal: She exhibits no edema.  Lymphadenopathy:    She has no cervical adenopathy.  Neurological: She is alert and oriented to person, place, and time.  Skin: Skin is warm and dry. No rash noted.  Psychiatric: Mood and affect normal.  Date normal except year Place+    Lab Results Results for orders placed or performed during the hospital encounter of 05/24/17 (from the past 48 hour(s))  CBC with Differential     Status: Abnormal   Collection Time: 05/24/17  4:54 PM  Result Value Ref Range   WBC 14.1 (H) 4.0 - 10.5 K/uL   RBC 4.09 3.87 - 5.11 MIL/uL   Hemoglobin 12.9 12.0 - 15.0 g/dL   HCT 37.1 36.0 - 46.0 %   MCV 90.7 78.0 - 100.0 fL   MCH 31.5 26.0 - 34.0 pg   MCHC 34.8 30.0 - 36.0 g/dL   RDW 14.4 11.5 - 15.5 %   Platelets 222 150 - 400 K/uL   Neutrophils Relative % 92 %   Neutro Abs 13.0 (H) 1.7 - 7.7 K/uL   Lymphocytes Relative 4 %   Lymphs Abs 0.5 (L) 0.7  - 4.0 K/uL   Monocytes Relative 4 %   Monocytes Absolute 0.6 0.1 - 1.0 K/uL   Eosinophils Relative 0 %   Eosinophils Absolute 0.0 0.0 - 0.7 K/uL   Basophils Relative 0 %   Basophils Absolute 0.0 0.0 - 0.1 K/uL    Comment: Performed at [facility], [place] [address] [place], [place] [address]  Culture, blood (routine x 2)     Status: None (Preliminary result)   Collection Time: 05/24/17  4:54 PM  Result Value Ref Range   Specimen Description      BLOOD BLOOD LEFT HAND Performed at [facility] [address], [place], [place] [address]    Special Requests      IN PEDIATRIC BOTTLE Blood Culture adequate volume Performed at [facility] [address] [place], [place] [address]    Culture  Setup Time      GRAM POSITIVE COCCI GRAM NEGATIVE RODS IN PEDIATRIC BOTTLE CRITICAL RESULT CALLED TO, READ BACK BY AND VERIFIED WITH: N. [place], RPHARMD (WL) AT 1000 ON 05/25/17 BY C. JESSUP, MLT.  Performed at [facility], [place] [address] [place], [place] [address]    Culture GRAM POSITIVE COCCI GRAM NEGATIVE RODS     Report Status PENDING   Blood Culture ID Panel (Reflexed)     Status: Abnormal   Collection Time: 05/24/17  4:54 PM  Result Value Ref Range   Enterococcus species DETECTED (A) NOT DETECTED    Comment: CRITICAL RESULT CALLED TO, READ BACK BY AND VERIFIED WITH: N. GLOGOVAC, RPHARMD(WL) AT 1000 ON 3/5/119 BY C. JESSUP, MLT.    Vancomycin resistance NOT DETECTED NOT DETECTED   Listeria monocytogenes NOT DETECTED NOT DETECTED   Staphylococcus species NOT DETECTED NOT DETECTED   Staphylococcus aureus NOT DETECTED NOT DETECTED   Streptococcus species NOT DETECTED NOT DETECTED   Streptococcus agalactiae NOT DETECTED NOT DETECTED   Streptococcus pneumoniae NOT DETECTED NOT DETECTED   Streptococcus pyogenes NOT DETECTED NOT DETECTED   Acinetobacter baumannii NOT DETECTED NOT DETECTED   Enterobacteriaceae species  DETECTED (A) NOT DETECTED    Comment: Enterobacteriaceae represent a large family of gram-negative bacteria, not a single organism. CRITICAL RESULT CALLED TO, READ BACK BY AND VERIFIED WITH: N. GLOGOVAC, RPHARM (WL) AT 1000 ON 05/25/17 BY C. JESSUP, MLT.    Enterobacter cloacae complex NOT DETECTED NOT DETECTED   Escherichia coli DETECTED (A) NOT DETECTED    Comment: CRITICAL RESULT CALLED TO, READ BACK BY AND VERIFIED WITH: N. GLOGOVAC, RPHARMD (WL) AT 1000 ON 05/25/17 BY C. JESSUP, MLT.    Klebsiella oxytoca NOT DETECTED NOT DETECTED   Klebsiella pneumoniae NOT DETECTED NOT DETECTED   Proteus species NOT DETECTED NOT DETECTED   Serratia marcescens NOT DETECTED NOT DETECTED   Carbapenem resistance NOT DETECTED NOT DETECTED   Haemophilus influenzae NOT DETECTED NOT DETECTED   Neisseria meningitidis NOT DETECTED NOT DETECTED   Pseudomonas aeruginosa NOT DETECTED NOT DETECTED   Candida albicans NOT DETECTED NOT DETECTED   Candida glabrata NOT DETECTED NOT DETECTED   Candida krusei NOT DETECTED NOT DETECTED   Candida parapsilosis NOT DETECTED NOT DETECTED   Candida tropicalis NOT DETECTED NOT DETECTED    Comment: Performed at [facility], [place] [address], [place], [place] [address]  I-stat troponin, ED     Status: None   Collection Time: 05/24/17  5:18 PM  Result Value Ref Range   Troponin i, poc 0.02 0.00 - 0.08 ng/mL   Comment 3            Comment: Due to the release kinetics of cTnI, a negative result within the first hours of the onset of symptoms does not rule out myocardial infarction with certainty. If myocardial infarction is still suspected, repeat the test at appropriate intervals.  I-Stat CG4 Lactic Acid, ED     Status: Abnormal   Collection Time: 05/24/17  5:20 PM  Result Value Ref Range   Lactic Acid, Venous 3.29 (HH) 0.5 - 1.9 mmol/L   Comment NOTIFIED PHYSICIAN   Comprehensive metabolic panel     Status: Abnormal   Collection Time: 05/24/17  6:39 PM  Result  Value Ref Range   Sodium 135 135 - 145 mmol/L   Potassium 3.9 3.5 - 5.1 mmol/L   Chloride 104 101 - 111 mmol/L   CO2 22 22 - 32 mmol/L   Glucose, Bld 169 (H) 65 - 99 mg/dL   BUN 19 6 - 20 mg/dL   Creatinine, Ser 0.80 0.44 - 1.00 mg/dL   Calcium 8.1 (L) 8.9 - 10.3 mg/dL   Total Protein 5.8 (L) 6.5 - 8.1 g/dL   Albumin 3.0 (L) 3.5 - 5.0 g/dL   AST 57 (H) 15 - 41 U/L   ALT 12 (L) 14 - 54 U/L   Alkaline Phosphatase 57 38 - 126 U/L   Total Bilirubin 1.9 (H) 0.3 - 1.2 mg/dL   GFR calc non Af Amer >60 >60 mL/min   GFR calc Af Amer >60 >60 mL/min    Comment: (NOTE) The eGFR has been calculated using the CKD EPI equation. This calculation has not been validated in all clinical situations. eGFR's persistently <60 mL/min signify possible Chronic Kidney Disease.    Anion gap 9 5 - 15    Comment: Performed at [facility], [place] [address], [place], [place] [address]  Culture, blood (routine x 2)     Status: None (Preliminary result)   Collection Time: 05/24/17  7:31 PM  Result Value Ref Range   Specimen Description      BLOOD LEFT HAND Performed at [facility] [address], [place], [place] [address]    Special Requests      IN PEDIATRIC BOTTLE Blood Culture adequate volume Performed at [facility] [address], [place], [place] [address]    Culture  Setup Time      GRAM POSITIVE COCCI IN PEDIATRIC BOTTLE Performed at [facility], [place] [address], [place], [place] [address]    Culture GRAM POSITIVE COCCI    Report Status PENDING   Influenza panel by PCR (type A & B)     Status: None   Collection Time: 05/24/17  7:31 PM  Result Value Ref Range   Influenza A By PCR NEGATIVE NEGATIVE   Influenza B By PCR NEGATIVE NEGATIVE    Comment: (NOTE) The Xpert Xpress Flu assay is intended as an aid in the diagnosis of  influenza and should not be used as a sole basis for treatment.  This  assay is FDA approved for  nasopharyngeal swab specimens only. Nasal  washings and aspirates are unacceptable for Xpert Xpress Flu testing. Performed at [facility], [place] [address] [place], [place] [address]   I-Stat CG4 Lactic Acid, ED     Status: Abnormal   Collection Time: 05/24/17  7:45 PM  Result Value Ref Range   Lactic Acid, Venous 2.02 (HH) 0.5 - 1.9 mmol/L   Comment NOTIFIED PHYSICIAN   Urinalysis, Routine w reflex microscopic     Status: Abnormal   Collection Time: 05/24/17  9:57 PM  Result Value Ref Range   Color, Urine AMBER (A) YELLOW    Comment: BIOCHEMICALS MAY BE AFFECTED BY COLOR   APPearance CLEAR CLEAR  Specific Gravity, Urine 1.020 1.005 - 1.030   pH 5.0 5.0 - 8.0   Glucose, UA NEGATIVE NEGATIVE mg/dL   Hgb urine dipstick SMALL (A) NEGATIVE   Bilirubin Urine NEGATIVE NEGATIVE   Ketones, ur NEGATIVE NEGATIVE mg/dL   Protein, ur NEGATIVE NEGATIVE mg/dL   Nitrite NEGATIVE NEGATIVE   Leukocytes, UA TRACE (A) NEGATIVE   RBC / HPF 0-5 0 - 5 RBC/hpf   WBC, UA 0-5 0 - 5 WBC/hpf   Bacteria, UA NONE SEEN NONE SEEN   Squamous Epithelial / LPF 0-5 (A) NONE SEEN   Mucus PRESENT    Hyaline Casts, UA PRESENT     Comment: Performed at [facility], [place] [address], [place], [place] [address]  Lactic acid, plasma     Status: None   Collection Time: 05/25/17  2:41 AM  Result Value Ref Range   Lactic Acid, Venous 1.6 0.5 - 1.9 mmol/L    Comment: Performed at [facility], [place] [address] [place], [place] [address]  Procalcitonin     Status: None   Collection Time: 05/25/17  2:41 AM  Result Value Ref Range   Procalcitonin 1.41 ng/mL    Comment:        Interpretation: PCT > 0.5 ng/mL and <= 2 ng/mL: Systemic infection (sepsis) is possible, but other conditions are known to elevate PCT as well. (NOTE)       Sepsis PCT Algorithm           Lower Respiratory Tract                                      Infection PCT Algorithm     ----------------------------     ----------------------------         PCT < 0.25 ng/mL                PCT < 0.10 ng/mL         Strongly encourage             Strongly discourage   discontinuation of antibiotics    initiation of antibiotics    ----------------------------     -----------------------------       PCT 0.25 - 0.50 ng/mL            PCT 0.10 - 0.25 ng/mL               OR       >80% decrease in PCT            Discourage initiation of                                            antibiotics      Encourage discontinuation           of antibiotics    ----------------------------     -----------------------------         PCT >= 0.50 ng/mL              PCT 0.26 - 0.50 ng/mL                AND       <80% decrease in PCT             Encourage initiation of  antibiotics       Encourage continuation           of antibiotics    ----------------------------     -----------------------------        PCT >= 0.50 ng/mL                  PCT > 0.50 ng/mL               AND         increase in PCT                  Strongly encourage                                      initiation of antibiotics    Strongly encourage escalation           of antibiotics                                     -----------------------------                                           PCT <= 0.25 ng/mL                                                 OR                                        > 80% decrease in PCT                                     Discontinue / Do not initiate                                             antibiotics Performed at [facility] [address], [place], [place] [address]   Protime-INR     Status: Abnormal   Collection Time: 05/25/17  2:41 AM  Result Value Ref Range   Prothrombin Time 19.8 (H) 11.4 - 15.2 seconds   INR 1.70     Comment: Performed at [facility], [place] [address], [place], [place] [address]    APTT     Status: Abnormal   Collection Time: 05/25/17  2:41 AM  Result Value Ref Range   aPTT 40 (H) 24 - 36 seconds    Comment:        IF BASELINE aPTT IS ELEVATED, SUGGEST PATIENT RISK ASSESSMENT BE USED TO DETERMINE APPROPRIATE ANTICOAGULANT THERAPY. Performed at [facility] [place] [address], [place], [place] [address]   CBC with Differential     Status: Abnormal   Collection Time: 05/25/17  5:13 AM  Result Value Ref Range   WBC 9.3 4.0 - 10.5 K/uL  RBC 3.93 3.87 - 5.11 MIL/uL   Hemoglobin 12.0 12.0 - 15.0 g/dL   HCT 36.0 36.0 - 46.0 %   MCV 91.6 78.0 - 100.0 fL   MCH 30.5 26.0 - 34.0 pg   MCHC 33.3 30.0 - 36.0 g/dL   RDW 14.2 11.5 - 15.5 %   Platelets 120 (L) 150 - 400 K/uL   Neutrophils Relative % 86 %   Neutro Abs 8.0 (H) 1.7 - 7.7 K/uL   Lymphocytes Relative 9 %   Lymphs Abs 0.8 0.7 - 4.0 K/uL   Monocytes Relative 5 %   Monocytes Absolute 0.5 0.1 - 1.0 K/uL   Eosinophils Relative 0 %   Eosinophils Absolute 0.0 0.0 - 0.7 K/uL   Basophils Relative 0 %   Basophils Absolute 0.0 0.0 - 0.1 K/uL    Comment: Performed at [facility], [place] [address], [place], [place] [address]  Comprehensive metabolic panel     Status: Abnormal   Collection Time: 05/25/17  5:13 AM  Result Value Ref Range   Sodium 135 135 - 145 mmol/L   Potassium 3.2 (L) 3.5 - 5.1 mmol/L    Comment: DELTA CHECK NOTED   Chloride 103 101 - 111 mmol/L   CO2 23 22 - 32 mmol/L   Glucose, Bld 138 (H) 65 - 99 mg/dL   BUN 19 6 - 20 mg/dL   Creatinine, Ser 0.79 0.44 - 1.00 mg/dL   Calcium 8.3 (L) 8.9 - 10.3 mg/dL   Total Protein 5.6 (L) 6.5 - 8.1 g/dL   Albumin 2.9 (L) 3.5 - 5.0 g/dL   AST 39 15 - 41 U/L   ALT 19 14 - 54 U/L   Alkaline Phosphatase 57 38 - 126 U/L   Total Bilirubin 1.4 (H) 0.3 - 1.2 mg/dL   GFR calc non Af Amer >60 >60 mL/min   GFR calc Af Amer >60 >60 mL/min    Comment: (NOTE) The eGFR has been calculated using the CKD EPI equation. This  calculation has not been validated in all clinical situations. eGFR's persistently <60 mL/min signify possible Chronic Kidney Disease.    Anion gap 9 5 - 15    Comment: Performed at [facility], [place] [address] [place], [place] [address]  Lactic acid, plasma     Status: None   Collection Time: 05/25/17  5:13 AM  Result Value Ref Range   Lactic Acid, Venous 1.4 0.5 - 1.9 mmol/L    Comment: Performed at [facility], [place] [address], [place], [place] [address]      Component Value Date/Time   SDES  05/24/2017 1931    BLOOD LEFT HAND Performed at [facility], [place] [address], [place], [place] [address]    SPECREQUEST  05/24/2017 1931    IN PEDIATRIC BOTTLE Blood Culture adequate volume Performed at [facility] [address], [place], [place] [address]    [place] Dose POSITIVE COCCI 05/24/2017 1931   REPTSTATUS PENDING 05/24/2017 1931   Dg Chest 2 View  Result Date: 05/25/2017 CLINICAL DATA:  Expiratory wheezing on the left. EXAM: CHEST  2 VIEW COMPARISON:  Earlier same day.  05/24/2017. FINDINGS: Cardiomegaly. Aortic atherosclerosis. Dual lead pacemaker. Interstitial lung markings which are worsened and presumably reflect early interstitial edema. Small amount of fluid in the fissures and in the posterior costophrenic angle. No consolidation or collapse. IMPRESSION: Development of abnormal interstitial pattern consistent with interstitial edema. No consolidation or collapse. Electronically Signed  By: Nelson Chimes M.D.   On: 05/25/2017 11:03   US Renal  Result Date: 05/25/2017 CLINICAL DATA:  Urinary tract infection, history hypertension EXAM: RENAL / URINARY TRACT ULTRASOUND COMPLETE COMPARISON:  None FINDINGS: Right Kidney: Length: 11.8 cm. Normal cortical thickness and echogenicity for age. Small peripelvic renal cyst 2.1 x 1.3 x 1.6 cm, mildly complicated by scattered internal echoes. No additional mass,  hydronephrosis or shadowing calcification. Left Kidney: Length: 11.3 cm. Normal cortical thickness and echogenicity. No mass, hydronephrosis or shadowing calcification. Bladder: Normal appearance IMPRESSION: Mildly complicated RIGHT renal cyst 2.1 cm in greatest diameter. Otherwise negative exam. Electronically Signed   By: Lavonia Dana M.D.   On: 05/25/2017 12:50   Dg Chest Port 1 View  Result Date: 05/25/2017 CLINICAL DATA:  Sepsis EXAM: PORTABLE CHEST 1 VIEW COMPARISON:  05/24/2017, 06/21/2016 FINDINGS: Left-sided pacing device. Cardiomegaly with mild central congestion. Aortic atherosclerosis. Mild increased opacity at the left lung base. No pneumothorax. IMPRESSION: Mild left lung base opacity, could reflect atelectasis or infiltrate. Stable cardiomegaly. Electronically Signed   By: Donavan Foil M.D.   On: 05/25/2017 02:16   Dg Chest Port 1 View  Result Date: 05/24/2017 CLINICAL DATA:  Short of breath last night with fever today. EXAM: PORTABLE CHEST 1 VIEW COMPARISON:  06/21/2016 FINDINGS: Cardiac silhouette is mildly enlarged. No mediastinal or hilar masses. No evidence of adenopathy. There are fine reticular type opacities most evident at the peripheral right upper lobe. Lungs otherwise clear, with no evidence of pneumonia or pulmonary edema. No pleural effusion or pneumothorax. Left anterior chest wall pacemaker is stable. Skeletal structures are demineralized but grossly intact. IMPRESSION: No acute cardiopulmonary disease. Electronically Signed   By: Lajean Manes M.D.   On: 05/24/2017 17:17   Recent Results (from the past 240 hour(s))  Culture, blood (routine x 2)     Status: None (Preliminary result)   Collection Time: 05/24/17  4:54 PM  Result Value Ref Range Status   Specimen Description   Final    BLOOD BLOOD LEFT HAND Performed at [facility] [address], [place], [place] [address]    Special Requests   Final    IN PEDIATRIC BOTTLE Blood Culture adequate  volume Performed at [facility] [address] [place], [place] [address]    Culture  Setup Time   Final    GRAM POSITIVE COCCI GRAM NEGATIVE RODS IN PEDIATRIC BOTTLE CRITICAL RESULT CALLED TO, READ BACK BY AND VERIFIED WITH: N. [place], RPHARMD (WL) AT 1000 ON 05/25/17 BY C. JESSUP, MLT.  Performed at [facility], [place] [address] [place], [place] [address]    Culture GRAM POSITIVE COCCI GRAM NEGATIVE RODS   Final   Report Status PENDING  Incomplete  Blood Culture ID Panel (Reflexed)     Status: Abnormal   Collection Time: 05/24/17  4:54 PM  Result Value Ref Range Status   Enterococcus species DETECTED (A) NOT DETECTED Final    Comment: CRITICAL RESULT CALLED TO, READ BACK BY AND VERIFIED WITH: N. GLOGOVAC, RPHARMD(WL) AT 1000 ON 3/5/119 BY C. JESSUP, MLT.    Vancomycin resistance NOT DETECTED NOT DETECTED Final   Listeria monocytogenes NOT DETECTED NOT DETECTED Final   Staphylococcus species NOT DETECTED NOT DETECTED Final   Staphylococcus aureus NOT DETECTED NOT DETECTED Final   Streptococcus species NOT DETECTED NOT DETECTED Final   Streptococcus agalactiae NOT DETECTED NOT DETECTED Final   Streptococcus pneumoniae NOT DETECTED NOT DETECTED Final  Streptococcus pyogenes NOT DETECTED NOT DETECTED Final   Acinetobacter baumannii NOT DETECTED NOT DETECTED Final   Enterobacteriaceae species DETECTED (A) NOT DETECTED Final    Comment: Enterobacteriaceae represent a large family of gram-negative bacteria, not a single organism. CRITICAL RESULT CALLED TO, READ BACK BY AND VERIFIED WITH: N. GLOGOVAC, RPHARM (WL) AT 1000 ON 05/25/17 BY C. JESSUP, MLT.    Enterobacter cloacae complex NOT DETECTED NOT DETECTED Final   Escherichia coli DETECTED (A) NOT DETECTED Final    Comment: CRITICAL RESULT CALLED TO, READ BACK BY AND VERIFIED WITH: N. GLOGOVAC, RPHARMD (WL) AT 1000 ON 05/25/17 BY C. JESSUP, MLT.    Klebsiella oxytoca NOT DETECTED NOT DETECTED Final    Klebsiella pneumoniae NOT DETECTED NOT DETECTED Final   Proteus species NOT DETECTED NOT DETECTED Final   Serratia marcescens NOT DETECTED NOT DETECTED Final   Carbapenem resistance NOT DETECTED NOT DETECTED Final   Haemophilus influenzae NOT DETECTED NOT DETECTED Final   Neisseria meningitidis NOT DETECTED NOT DETECTED Final   Pseudomonas aeruginosa NOT DETECTED NOT DETECTED Final   Candida albicans NOT DETECTED NOT DETECTED Final   Candida glabrata NOT DETECTED NOT DETECTED Final   Candida krusei NOT DETECTED NOT DETECTED Final   Candida parapsilosis NOT DETECTED NOT DETECTED Final   Candida tropicalis NOT DETECTED NOT DETECTED Final    Comment: Performed at [facility], [place] [address], [place], [place] [address]  Culture, blood (routine x 2)     Status: None (Preliminary result)   Collection Time: 05/24/17  7:31 PM  Result Value Ref Range Status   Specimen Description   Final    BLOOD LEFT HAND Performed at [facility] [address], [place], [place] [address]    Special Requests   Final    IN PEDIATRIC BOTTLE Blood Culture adequate volume Performed at [facility] [address], [place], [place] [address]    Culture  Setup Time   Final    GRAM POSITIVE COCCI IN PEDIATRIC BOTTLE Performed at [facility], [place] [address], [place], [place] [address]    Culture GRAM POSITIVE COCCI  Final   Report Status PENDING  Incomplete    Microbiology: Recent Results (from the past 240 hour(s))  Culture, blood (routine x 2)     Status: None (Preliminary result)   Collection Time: 05/24/17  4:54 PM  Result Value Ref Range Status   Specimen Description   Final    BLOOD BLOOD LEFT HAND Performed at [facility] [address], [place], [place] [address]    Special Requests   Final    IN PEDIATRIC BOTTLE Blood Culture adequate volume Performed at [facility] [address], [place], [place]  [address]    Culture  Setup Time   Final    GRAM POSITIVE COCCI GRAM NEGATIVE RODS IN PEDIATRIC BOTTLE CRITICAL RESULT CALLED TO, READ BACK BY AND VERIFIED WITH: N. [place], RPHARMD (WL) AT 1000 ON 05/25/17 BY C. JESSUP, MLT.  Performed at [facility], [place] [address] [place], [place] [address]    Culture GRAM POSITIVE COCCI GRAM NEGATIVE RODS   Final   Report Status PENDING  Incomplete  Blood Culture ID Panel (Reflexed)     Status: Abnormal   Collection Time: 05/24/17  4:54 PM  Result Value Ref Range Status   Enterococcus species DETECTED (A) NOT DETECTED Final    Comment: CRITICAL RESULT CALLED TO, READ BACK BY  AND VERIFIED WITH: N. GLOGOVAC, RPHARMD(WL) AT 1000 ON 3/5/119 BY C. JESSUP, MLT.    Vancomycin resistance NOT DETECTED NOT DETECTED Final   Listeria monocytogenes NOT DETECTED NOT DETECTED Final   Staphylococcus species NOT DETECTED NOT DETECTED Final   Staphylococcus aureus NOT DETECTED NOT DETECTED Final   Streptococcus species NOT DETECTED NOT DETECTED Final   Streptococcus agalactiae NOT DETECTED NOT DETECTED Final   Streptococcus pneumoniae NOT DETECTED NOT DETECTED Final   Streptococcus pyogenes NOT DETECTED NOT DETECTED Final   Acinetobacter baumannii NOT DETECTED NOT DETECTED Final   Enterobacteriaceae species DETECTED (A) NOT DETECTED Final    Comment: Enterobacteriaceae represent a large family of gram-negative bacteria, not a single organism. CRITICAL RESULT CALLED TO, READ BACK BY AND VERIFIED WITH: N. GLOGOVAC, RPHARM (WL) AT 1000 ON 05/25/17 BY C. JESSUP, MLT.    Enterobacter cloacae complex NOT DETECTED NOT DETECTED Final   Escherichia coli DETECTED (A) NOT DETECTED Final    Comment: CRITICAL RESULT CALLED TO, READ BACK BY AND VERIFIED WITH: N. GLOGOVAC, RPHARMD (WL) AT 1000 ON 05/25/17 BY C. JESSUP, MLT.    Klebsiella oxytoca NOT DETECTED NOT DETECTED Final   Klebsiella pneumoniae NOT DETECTED NOT DETECTED Final   Proteus species NOT DETECTED NOT  DETECTED Final   Serratia marcescens NOT DETECTED NOT DETECTED Final   Carbapenem resistance NOT DETECTED NOT DETECTED Final   Haemophilus influenzae NOT DETECTED NOT DETECTED Final   Neisseria meningitidis NOT DETECTED NOT DETECTED Final   Pseudomonas aeruginosa NOT DETECTED NOT DETECTED Final   Candida albicans NOT DETECTED NOT DETECTED Final   Candida glabrata NOT DETECTED NOT DETECTED Final   Candida krusei NOT DETECTED NOT DETECTED Final   Candida parapsilosis NOT DETECTED NOT DETECTED Final   Candida tropicalis NOT DETECTED NOT DETECTED Final    Comment: Performed at [facility], [place] [address], [place], [place] [address]  Culture, blood (routine x 2)     Status: None (Preliminary result)   Collection Time: 05/24/17  7:31 PM  Result Value Ref Range Status   Specimen Description   Final    BLOOD LEFT HAND Performed at [facility] [address] [place], [place] [address]    Special Requests   Final    IN PEDIATRIC BOTTLE Blood Culture adequate volume Performed at [facility] [address], [place], [place] [address]    Culture  Setup Time   Final    GRAM POSITIVE COCCI IN PEDIATRIC BOTTLE Performed at [facility], [place] [address], [place],  [address]    Culture GRAM POSITIVE COCCI  Final   Report Status PENDING  Incomplete    Radiographs and labs were personally reviewed by me.   Bobby Rumpf, MD [facility] [phone] 05/25/2017, 5:18 PM

## 2017-05-25 NOTE — Progress Notes (Signed)
PT Cancellation Note  Patient Details Name: Beth PiggCharlotte Santiago MRN: [id number] DOB: 05-Dec-1924   Cancelled Treatment:     PT order received. Pt unavailable at this time due to testing or procedure. Will attempt evaluation at a later date/time.   Greggory StallionWrisley, Zoltan Genest Kerstine 05/25/2017, 10:58 AM

## 2017-05-25 NOTE — Plan of Care (Signed)
  Progressing Education: Knowledge of General Education information will improve 05/25/2017 0142 - Progressing by Cristela FeltSteffens, Dalaysia Harms P, RN Pain Managment: General experience of comfort will improve 05/25/2017 0142 - Progressing by Cristela FeltSteffens, Tyan Lasure P, RN Safety: Ability to remain free from injury will improve 05/25/2017 0142 - Progressing by Cristela FeltSteffens, Areatha Kalata P, RN

## 2017-05-26 ENCOUNTER — Other Ambulatory Visit (HOSPITAL_COMMUNITY): Payer: Medicare Other

## 2017-05-26 ENCOUNTER — Other Ambulatory Visit: Payer: Self-pay

## 2017-05-26 DIAGNOSIS — R6510 Systemic inflammatory response syndrome (SIRS) of non-infectious origin without acute organ dysfunction: Secondary | ICD-10-CM

## 2017-05-26 DIAGNOSIS — A4150 Gram-negative sepsis, unspecified: Secondary | ICD-10-CM

## 2017-05-26 DIAGNOSIS — I2510 Atherosclerotic heart disease of native coronary artery without angina pectoris: Secondary | ICD-10-CM

## 2017-05-26 DIAGNOSIS — A4181 Sepsis due to Enterococcus: Principal | ICD-10-CM

## 2017-05-26 DIAGNOSIS — I480 Paroxysmal atrial fibrillation: Secondary | ICD-10-CM

## 2017-05-26 LAB — URINE CULTURE: CULTURE: NO GROWTH

## 2017-05-26 LAB — BASIC METABOLIC PANEL
ANION GAP: 7 (ref 5–15)
BUN: 14 mg/dL (ref 6–20)
CHLORIDE: 106 mmol/L (ref 101–111)
CO2: 23 mmol/L (ref 22–32)
Calcium: 8.5 mg/dL — ABNORMAL LOW (ref 8.9–10.3)
Creatinine, Ser: 0.73 mg/dL (ref 0.44–1.00)
GFR calc non Af Amer: >60 mL/min (ref >60)
Glucose, Bld: 133 mg/dL — ABNORMAL HIGH (ref 65–99)
Potassium: 3.9 mmol/L (ref 3.5–5.1)
SODIUM: 136 mmol/L (ref 135–145)

## 2017-05-26 LAB — CBC
HCT: 35.6 % — ABNORMAL LOW (ref 36.0–46.0)
HEMOGLOBIN: 11.6 g/dL — AB (ref 12.0–15.0)
MCH: 30.7 pg (ref 26.0–34.0)
MCHC: 32.6 g/dL (ref 30.0–36.0)
MCV: 94.2 fL (ref 78.0–100.0)
Platelets: 107 10*3/uL — ABNORMAL LOW (ref 150–400)
RBC: 3.78 MIL/uL — AB (ref 3.87–5.11)
RDW: 14.3 % (ref 11.5–15.5)
WBC: 6.7 10*3/uL (ref 4.0–10.5)

## 2017-05-26 LAB — MAGNESIUM: MAGNESIUM: 1.9 mg/dL (ref 1.7–2.4)

## 2017-05-26 MED ORDER — FUROSEMIDE 10 MG/ML IJ SOLN
40.0000 mg | Freq: Once | INTRAMUSCULAR | Status: AC
  Administered 2017-05-26: 40 mg via INTRAVENOUS
  Filled 2017-05-26: qty 4

## 2017-05-26 NOTE — Progress Notes (Signed)
PROGRESS NOTE  Beth Santiago RUE:[id number] DOB: [birth year] DOA: 05/24/2017 PCP: Adrian Prince, MD  HPI/Recap of past 24 hours:  Feeling better, no fever, denies pain Daughter at bedside  Assessment/Plan: Principal Problem:   SIRS (systemic inflammatory response syndrome) (HCC) Active Problems:   HYPERTENSION, BENIGN   CAD, NATIVE VESSEL   PACEMAKER-[employer]   Atrial fibrillation (HCC)   Chronic diastolic CHF (congestive heart failure) (HCC)  Enterococcus bacteremia/sepsis presented on admission with leukocytosis lactic acidosis hypotension Unclear source Patient report recent dental procedure Currently on Zosyn Infectious disease consulted, will follow recommendation  Possible acute on chronic diastolic CHF She reports some short of breath this afternoon DC IV fluids, 1 dose IV Lasix Will reassess in the morning  Chronic A. fib on Xarelto Rate controlled  Home meds Bystolic held due to hypotension Likely able to resume in the morning  History of complete heart block status post pacemaker   Code Status: full  Family Communication: patient and daughter  Disposition Plan: not ready for discharge   Consultants:  Infectious disease  Cardiology  Procedures:  TEE  Antibiotics:  As above   Objective: BP 110/72 (BP Location: Right Arm)   Pulse 61   Temp 98.1 F (36.7 C) (Oral)   Resp 18   Ht 5\' 6"  (1.676 m)   Wt 79.1 kg (174 lb 6.1 oz)   SpO2 98%   BMI 28.15 kg/m   Intake/Output Summary (Last 24 hours) at 05/26/2017 2041 Last data filed at 05/26/2017 1757 Gross per 24 hour  Intake 1095 ml  Output 1800 ml  Net -705 ml   Filed Weights   05/25/17 0109 05/26/17 0500  Weight: 77.7 kg (171 lb 4.8 oz) 79.1 kg (174 lb 6.1 oz)    Exam: Patient is examined daily including today on 05/26/2017, exams remain the same as of yesterday except that has changed    General:  NAD  Cardiovascular: paced rhythm  Respiratory: CTABL  Abdomen:  Soft/ND/NT, positive BS  Musculoskeletal: No Edema  Neuro: alert, oriented   Data Reviewed: Basic Metabolic Panel: Recent Labs  Lab 05/24/17 1839 05/25/17 0513 05/26/17 0534  NA 135 135 136  K 3.9 3.2* 3.9  CL 104 103 106  CO2 22 23 23   GLUCOSE 169* 138* 133*  BUN 19 19 14   CREATININE 0.80 0.79 0.73  CALCIUM 8.1* 8.3* 8.5*  MG  --   --  1.9   Liver Function Tests: Recent Labs  Lab 05/24/17 1839 05/25/17 0513  AST 57* 39  ALT 12* 19  ALKPHOS 57 57  BILITOT 1.9* 1.4*  PROT 5.8* 5.6*  ALBUMIN 3.0* 2.9*   No results for input(s): LIPASE, AMYLASE in the last 168 hours. No results for input(s): AMMONIA in the last 168 hours. CBC: Recent Labs  Lab 05/24/17 1654 05/25/17 0513 05/26/17 0534  WBC 14.1* 9.3 6.7  NEUTROABS 13.0* 8.0*  --   HGB 12.9 12.0 11.6*  HCT 37.1 36.0 35.6*  MCV 90.7 91.6 94.2  PLT 222 120* 107*   Cardiac Enzymes:   No results for input(s): CKTOTAL, CKMB, CKMBINDEX, TROPONINI in the last 168 hours. BNP (last 3 results) No results for input(s): BNP in the last 8760 hours.  ProBNP (last 3 results) No results for input(s): PROBNP in the last 8760 hours.  CBG: No results for input(s): GLUCAP in the last 168 hours.  Recent Results (from the past 240 hour(s))  Culture, blood (routine x 2)     Status: Abnormal (Preliminary result)  Collection Time: 05/24/17  4:54 PM  Result Value Ref Range Status   Specimen Description   Final    BLOOD BLOOD LEFT HAND Performed at [facility], [address], [place], [place] [address]    Special Requests   Final    IN PEDIATRIC BOTTLE Blood Culture adequate volume Performed at [facility], [address], [place], [place] [address]    Culture  Setup Time   Final    GRAM POSITIVE COCCI GRAM NEGATIVE RODS IN PEDIATRIC BOTTLE CRITICAL RESULT CALLED TO, READ BACK BY AND VERIFIED WITH: N. GLOGOVAC, RPHARMD (WL) AT 1000 ON 05/25/17 BY C. JESSUP, MLT.  Performed at  [facility], [address], [place], [place] [address]    Culture ENTEROCOCCUS FAECALIS GRAM NEGATIVE RODS  (A)  Final   Report Status PENDING  Incomplete  Blood Culture ID Panel (Reflexed)     Status: Abnormal   Collection Time: 05/24/17  4:54 PM  Result Value Ref Range Status   Enterococcus species DETECTED (A) NOT DETECTED Final    Comment: CRITICAL RESULT CALLED TO, READ BACK BY AND VERIFIED WITH: N. GLOGOVAC, RPHARMD(WL) AT 1000 ON 3/5/119 BY C. JESSUP, MLT.    Vancomycin resistance NOT DETECTED NOT DETECTED Final   Listeria monocytogenes NOT DETECTED NOT DETECTED Final   Staphylococcus species NOT DETECTED NOT DETECTED Final   Staphylococcus aureus NOT DETECTED NOT DETECTED Final   Streptococcus species NOT DETECTED NOT DETECTED Final   Streptococcus agalactiae NOT DETECTED NOT DETECTED Final   Streptococcus pneumoniae NOT DETECTED NOT DETECTED Final   Streptococcus pyogenes NOT DETECTED NOT DETECTED Final   Acinetobacter baumannii NOT DETECTED NOT DETECTED Final   Enterobacteriaceae species DETECTED (A) NOT DETECTED Final    Comment: Enterobacteriaceae represent a large family of gram-negative bacteria, not a single organism. CRITICAL RESULT CALLED TO, READ BACK BY AND VERIFIED WITH: N. GLOGOVAC, RPHARM (WL) AT 1000 ON 05/25/17 BY C. JESSUP, MLT.    Enterobacter cloacae complex NOT DETECTED NOT DETECTED Final   Escherichia coli DETECTED (A) NOT DETECTED Final    Comment: CRITICAL RESULT CALLED TO, READ BACK BY AND VERIFIED WITH: N. GLOGOVAC, RPHARMD (WL) AT 1000 ON 05/25/17 BY C. JESSUP, MLT.    Klebsiella oxytoca NOT DETECTED NOT DETECTED Final   Klebsiella pneumoniae NOT DETECTED NOT DETECTED Final   Proteus species NOT DETECTED NOT DETECTED Final   Serratia marcescens NOT DETECTED NOT DETECTED Final   Carbapenem resistance NOT DETECTED NOT DETECTED Final   Haemophilus influenzae NOT DETECTED NOT DETECTED Final   Neisseria meningitidis NOT DETECTED NOT DETECTED  Final   Pseudomonas aeruginosa NOT DETECTED NOT DETECTED Final   Candida albicans NOT DETECTED NOT DETECTED Final   Candida glabrata NOT DETECTED NOT DETECTED Final   Candida krusei NOT DETECTED NOT DETECTED Final   Candida parapsilosis NOT DETECTED NOT DETECTED Final   Candida tropicalis NOT DETECTED NOT DETECTED Final    Comment: Performed at [facility], [address] [place], [place] [address]  Culture, blood (routine x 2)     Status: Abnormal (Preliminary result)   Collection Time: 05/24/17  7:31 PM  Result Value Ref Range Status   Specimen Description   Final    BLOOD LEFT HAND Performed at [facility], [address], [place], [place] [address]    Special Requests   Final    IN PEDIATRIC BOTTLE Blood Culture adequate volume Performed at [facility], [address] [place], [place] [address]    Culture  Setup Time   Final    GRAM POSITIVE COCCI IN PEDIATRIC BOTTLE Performed at [facility], [address] [place], [place] [address]    Culture ENTEROCOCCUS FAECALIS (A)  Final   Report Status PENDING  Incomplete  Culture, Urine     Status: None   Collection Time: 05/25/17  6:40 AM  Result Value Ref Range Status   Specimen Description   Final    URINE, CLEAN CATCH Performed at [facility], [address] [place], [place] [address]    Special Requests   Final    NONE Performed at [facility], [address] [place], [place] [address]    Culture   Final    NO GROWTH Performed at [facility], [address] [place], [place] [address]    Report Status 05/26/2017 FINAL  Final     Studies: No results found.  Scheduled Meds: . aspirin EC  81 mg Oral QPM  . latanoprost  1 drop Both Eyes QHS  . levothyroxine  88 mcg Oral Daily  . pantoprazole  40 mg Oral Daily  . rivaroxaban  20 mg Oral Q supper  . rosuvastatin  10 mg Oral Once per day on Mon Wed Fri  . timolol  1  drop Both Eyes q morning - 10a    Continuous Infusions: . piperacillin-tazobactam (ZOSYN)  IV Stopped (05/26/17 1825)     Time spent: 35mins I have personally reviewed and interpreted on  05/26/2017 daily labs, tele strips, imagings as discussed above under date review session and assessment and plans.  I reviewed all nursing notes, pharmacy notes, consultant notes,  vitals, pertinent old records  I have discussed plan of care as described above with RN , patient and family on 05/26/2017   Albertine GratesFang Rachel Rison MD, PhD  [facility] Pager [phone]. If 7PM-7AM, please contact night-coverage at [email or web address], password [email or web address] 05/26/2017, 8:41 PM  LOS: 1 day

## 2017-05-26 NOTE — Progress Notes (Signed)
[facility] called and [facility] to [facility] for TEE tomorrow morning @ 9am. Asked for patient to be there @ 0730 [facility] will have patient there as close to then as possible.

## 2017-05-26 NOTE — Consult Note (Addendum)
Cardiology Consultation:   Patient ID: Beth Santiago; [id number]; 08-05-1924   Admit date: 05/24/2017 Date of Consult: 05/26/2017  Primary Care Provider: Adrian Prince, MD Primary Cardiologist: Arvilla Meres, MD  Primary Electrophysiologist:     Patient Profile:   Beth Santiago is a 84 y.o. female with a hx of HTN, HLD, chronic diastolic heart failure, CHB s/p PPM, permanent Afib on xarelto, nonobstructive CAD by cath in 2007, and  who is being seen today for the evaluation of suspected endocarditis at the request of Dr. Roda Shutters.  History of Present Illness:   Beth Santiago is known to this service and last saw Dr. Gala Romney in clinic on 12/23/16. She was doing well at that time, was euvolemic, and no medication changes were made.   She presented to [facility] with 24-hr history of feeling weak, fever, and chills. On arrival, she was hypotensive with an elevated lactate and leukocytosis. CXR, UA, and influenza panel were negative for infection. She was admitted and given a fluid bolus. Blood cultures drawn and started on empiric ABX. Blood cultures with enterococcus bacteremia. TTE was performed with normal EF, mild to moderate MR, RV volume overload, and no evidence of vegetation. Cardiology was consulted for possible TEE to rule out endocarditis.  On my interview, she slept well last night and is feeling much better today. She continues on vanc and zosyn. She denies bleeding problems, chest pain, palpitations, dizziness, and feelings of pre-syncope. I discussed her weight gain and she does not feel volume overloaded and denies orthopnea and lower extremity swelling.   Telemetry with V-paced rhythm. Leukocytosis has resolved: 6.7 (14.1). She was afebrile overnight. No clear source for her bacteremia at this time.   Past Medical History:  Diagnosis Date  . Atrial flutter (HCC)    a. Noted during pacemaker implantation 2011, spontaneously terminated.  . Biatrial enlargement    severe  .  CAD (coronary artery disease)    a. Nonobstructive by cath 2005 - 40-50% mid LAD. b. Nonischemic nuc 05/2011.  . Carotid artery disease (HCC)    a. 1-50% bilateral (upper end) - March 2011.  Marland Kitchen CHF (congestive heart failure) (HCC)   . DJD (degenerative joint disease)   . GERD (gastroesophageal reflux disease)   . Hemoptysis 05/2016  . Hyperlipidemia   . Hypertension   . Hypothyroid   . Persistent atrial fibrillation (HCC) 05/2011  . Second degree heart block    a. s/p PPM 2005. b. Gen change to [employer] by Dr Johney Frame  9/11 for premature ERI 11/2009.    Past Surgical History:  Procedure Laterality Date  . ABDOMINAL SURGERY     twisted bowel  . CARDIOVERSION  06/11/2011   Procedure: CARDIOVERSION;  Surgeon: Dolores Patty, MD;  Location: [facility] ENDOSCOPY;  Service: Cardiovascular;  Laterality: N/A;  . CARDIOVERSION N/A 01/04/2013   Procedure: CARDIOVERSION;  Surgeon: Lars Masson, MD;  Location: [facility] ENDOSCOPY;  Service: Cardiovascular;  Laterality: N/A;  . COLON SURGERY    . HEMORROIDECTOMY    . PACEMAKER INSERTION  2005, 12/11/09   implanted by Dr Reyes Ivan, generator change 9/11 by Fawn Kirk for premature ERI  . TEE WITHOUT CARDIOVERSION  06/11/2011   Procedure: TRANSESOPHAGEAL ECHOCARDIOGRAM (TEE);  Surgeon: Dolores Patty, MD;  Location: [facility] ENDOSCOPY;  Service: Cardiovascular;  Laterality: N/A;  . TONSILLECTOMY       Home Medications:  Prior to Admission medications   Medication Sig Start Date End Date Taking? Authorizing Provider  amLODipine (NORVASC) 5 MG tablet Take 1 tablet (  5 mg total) by mouth daily. 10/26/16  Yes Allred, Fayrene Fearing, MD  BYSTOLIC 5 MG tablet TAKE 1/2 TABLET BY MOUTH DAILY Patient taking differently: TAKE 1/2 TABLET (2.5 MG) BY MOUTH DAILY 08/13/16  Yes Allred, Fayrene Fearing, MD  co-enzyme Q-10 30 MG capsule Take 30 mg by mouth daily.   Yes [provider]  fish oil-omega-3 fatty acids 1000 MG capsule Take 2 g daily by mouth.    Yes [provider]    furosemide (LASIX) 40 MG tablet Take 1 tablet (40 mg total) by mouth daily. May take an extra tablet for wt gain of 3 lbs overnight or 5lbs in a week. 04/15/17  Yes Bensimhon, Bevelyn Buckles, MD  latanoprost (XALATAN) 0.005 % ophthalmic solution Place 1 drop into both eyes at bedtime.     Yes [provider]  levothyroxine (SYNTHROID, LEVOTHROID) 88 MCG tablet Take 88 mcg by mouth daily.  03/13/14  Yes [provider]  Misc Natural Products (OSTEO BI-FLEX JOINT SHIELD PO) Take 1 tablet by mouth daily.   Yes [provider]  Misc Natural Products (TART CHERRY ADVANCED) CAPS Take 1 capsule by mouth daily.   Yes [provider]  Multiple Vitamins-Minerals (CENTRUM SILVER PO) Take 1 tablet by mouth daily.     Yes [provider]  omeprazole (PRILOSEC) 20 MG capsule Take 20 mg by mouth 2 (two) times daily before a meal. Take once or twice a day   Yes [provider]  potassium chloride SA (K-DUR,KLOR-CON) 20 MEQ tablet Take 1 tablet (20 mEq total) by mouth daily. Take 1 extra tab when you take extra Lasix 10/26/16  Yes Bensimhon, Bevelyn Buckles, MD  timolol (BETIMOL) 0.5 % ophthalmic solution Place 1 drop into both eyes every morning.    Yes [provider]  acetaminophen (TYLENOL) 325 MG tablet Take 2 tablets (650 mg total) by mouth every 6 (six) hours as needed for mild pain (or Fever >/= 101). 06/23/16   Alison Murray, MD  aspirin EC 81 MG tablet Take 81 mg by mouth every evening.     [provider]  calcium carbonate (TUMS - DOSED IN MG ELEMENTAL CALCIUM) 500 MG chewable tablet Chew 1 tablet (200 mg of elemental calcium total) by mouth as needed for indigestion or heartburn. Patient not taking: Reported on 05/24/2017 06/24/16   Rodolph Bong, MD  furosemide (LASIX) 40 MG tablet TAKE 1 TABLET BY MOUTH DAILY Patient not taking: Reported on 05/24/2017 04/15/17   Bensimhon, Bevelyn Buckles, MD  rosuvastatin (CRESTOR) 20 MG tablet Take 10 mg by mouth 3  (three) times a week. Andris Flurry and Sat    [provider]  XARELTO 20 MG TABS tablet TAKE 1 TABLET BY MOUTH DAILY WITH SUPPER 11/24/16   Bensimhon, Bevelyn Buckles, MD  hydrochlorothiazide (MICROZIDE) 12.5 MG capsule Take 12.5 mg by mouth daily.    06/08/11  [provider]  olmesartan (BENICAR) 40 MG tablet Take 40 mg by mouth daily.    06/08/11  [provider]    Inpatient Medications: Scheduled Meds: . aspirin EC  81 mg Oral QPM  . latanoprost  1 drop Both Eyes QHS  . levothyroxine  88 mcg Oral Daily  . pantoprazole  40 mg Oral Daily  . rivaroxaban  20 mg Oral Q supper  . rosuvastatin  10 mg Oral Once per day on Mon Wed Fri  . timolol  1 drop Both Eyes q morning - 10a   Continuous Infusions: .  sodium chloride 75 mL/hr at 05/26/17 0046  . piperacillin-tazobactam (ZOSYN)  IV 3.375 g (05/26/17 0542)   PRN Meds: acetaminophen **OR** acetaminophen, ondansetron **OR** ondansetron (ZOFRAN) IV  Allergies:   No Known Allergies  Social History:   Social History   Socioeconomic History  . Marital status: Widowed    Spouse name: Not on file  . Number of children: Not on file  . Years of education: Not on file  . Highest education level: Not on file  Social Needs  . Financial resource strain: Not on file  . Food insecurity - worry: Not on file  . Food insecurity - inability: Not on file  . Transportation needs - medical: Not on file  . Transportation needs - non-medical: Not on file  Occupational History  . Not on file  Tobacco Use  . Smoking status: Never Smoker  . Smokeless tobacco: Never Used  Substance and Sexual Activity  . Alcohol use: Yes    Comment: 05/2016    OCCASIONAL   . Drug use: No  . Sexual activity: Not on file  Other Topics Concern  . Not on file  Social History Narrative  . Not on file    Family History:    Family History  Problem Relation Age of Onset  . Cancer Mother   . Diabetes Mother   . Hypertension Mother   . Heart disease  Father   . Cancer Brother   . Heart disease Brother      ROS:  Please see the history of present illness.   All other ROS reviewed and negative.     Physical Exam/Data:   Vitals:   05/25/17 1500 05/25/17 2052 05/26/17 0500 05/26/17 0542  BP: 123/65 140/75  134/63  Pulse: 62 71  62  Resp: 20 20  16   Temp: 98 F (36.7 C) 98.1 F (36.7 C)  98.1 F (36.7 C)  TempSrc: Oral Oral  Oral  SpO2: 97% 97%  97%  Weight:   174 lb 6.1 oz (79.1 kg)   Height:        Intake/Output Summary (Last 24 hours) at 05/26/2017 0755 Last data filed at 05/26/2017 0600 Gross per 24 hour  Intake 2218.75 ml  Output -  Net 2218.75 ml   Filed Weights   05/25/17 0109 05/26/17 0500  Weight: 171 lb 4.8 oz (77.7 kg) 174 lb 6.1 oz (79.1 kg)   Body mass index is 28.15 kg/m.  General:  Well nourished, well developed, in no acute distress HEENT: normal Neck: no JVD Vascular: No carotid bruits Cardiac:  normal S1, S2; RRR; no murmur  Lungs:  Respirations unlabored, wheezes present bilaterally Abd: soft, nontender, no hepatomegaly  Ext: trace edema Musculoskeletal:  No deformities, BUE and BLE strength normal and equal Skin: warm and dry  Neuro:  CNs 2-12 intact, no focal abnormalities noted Psych:  Normal affect   EKG:  The EKG was personally reviewed and demonstrates:  V-paced Telemetry:  Telemetry was personally reviewed and demonstrates:  V-paced  Relevant CV Studies:  Echo 05/25/17: Study Conclusions - Left ventricle: The cavity size was normal. There was mild   concentric hypertrophy. Systolic function was normal. The   estimated ejection fraction was in the range of 55% to 60%. Wall   motion was normal; there were no regional wall motion   abnormalities. - Ventricular septum: Septal motion showed paradox. The contour   showed diastolic flattening. These changes are consistent with RV   volume overload. - Aortic valve:  Valve mobility was mildly restricted. There was   mild regurgitation. -  Mitral valve: Calcified annulus. Mildly thickened leaflets .   There was mild to moderate regurgitation directed centrally. - Left atrium: The atrium was severely dilated. - Right ventricle: The cavity size was severely dilated. Systolic   function was mildly reduced. - Right atrium: The atrium was severely dilated. - Atrial septum: No defect or patent foramen ovale was identified. - Tricuspid valve: There was malcoaptation of the valve leaflets.   There was wide-open regurgitation directed centrally. A diagnosis   of severe regurgitation is supported by an annulus dimension = 40   mm and hepatic vein systolic flow reversal. - Pulmonary arteries: Systolic pressure was moderately increased.   PA peak pressure: 45 mm Hg (S). - Recommendations: Consider transesophageal echocardiography if   clinically indicated in order to exclude valve o lead   endocarditis.  Impressions: - There was no evidence of a vegetation. Recommendations:  Consider transesophageal echocardiography if clinically indicated in order to exclude valvular endocarditis.   Laboratory Data:  Chemistry Recent Labs  Lab 05/24/17 1839 05/25/17 0513 05/26/17 0534  NA 135 135 136  K 3.9 3.2* 3.9  CL 104 103 106  CO2 22 23 23   GLUCOSE 169* 138* 133*  BUN 19 19 14   CREATININE 0.80 0.79 0.73  CALCIUM 8.1* 8.3* 8.5*  GFRNONAA >60 >60 >60  GFRAA >60 >60 >60  ANIONGAP 9 9 7     Recent Labs  Lab 05/24/17 1839 05/25/17 0513  PROT 5.8* 5.6*  ALBUMIN 3.0* 2.9*  AST 57* 39  ALT 12* 19  ALKPHOS 57 57  BILITOT 1.9* 1.4*   Hematology Recent Labs  Lab 05/24/17 1654 05/25/17 0513 05/26/17 0534  WBC 14.1* 9.3 6.7  RBC 4.09 3.93 3.78*  HGB 12.9 12.0 11.6*  HCT 37.1 36.0 35.6*  MCV 90.7 91.6 94.2  MCH 31.5 30.5 30.7  MCHC 34.8 33.3 32.6  RDW 14.4 14.2 14.3  PLT 222 120* 107*   Cardiac EnzymesNo results for input(s): TROPONINI in the last 168 hours.  Recent Labs  Lab 05/24/17 1718  TROPIPOC 0.02      BNPNo results for input(s): BNP, PROBNP in the last 168 hours.  DDimer No results for input(s): DDIMER in the last 168 hours.  Radiology/Studies:  Dg Chest 2 View  Result Date: 05/25/2017 CLINICAL DATA:  Expiratory wheezing on the left. EXAM: CHEST  2 VIEW COMPARISON:  Earlier same day.  05/24/2017. FINDINGS: Cardiomegaly. Aortic atherosclerosis. Dual lead pacemaker. Interstitial lung markings which are worsened and presumably reflect early interstitial edema. Small amount of fluid in the fissures and in the posterior costophrenic angle. No consolidation or collapse. IMPRESSION: Development of abnormal interstitial pattern consistent with interstitial edema. No consolidation or collapse. Electronically Signed   By: Paulina Fusi M.D.   On: 05/25/2017 11:03   US Renal  Result Date: 05/25/2017 CLINICAL DATA:  Urinary tract infection, history hypertension EXAM: RENAL / URINARY TRACT ULTRASOUND COMPLETE COMPARISON:  None FINDINGS: Right Kidney: Length: 11.8 cm. Normal cortical thickness and echogenicity for age. Small peripelvic renal cyst 2.1 x 1.3 x 1.6 cm, mildly complicated by scattered internal echoes. No additional mass, hydronephrosis or shadowing calcification. Left Kidney: Length: 11.3 cm. Normal cortical thickness and echogenicity. No mass, hydronephrosis or shadowing calcification. Bladder: Normal appearance IMPRESSION: Mildly complicated RIGHT renal cyst 2.1 cm in greatest diameter. Otherwise negative exam. Electronically Signed   By: Ulyses Southward M.D.   On: 05/25/2017 12:50   Dg Chest  Port 1 View  Result Date: 05/25/2017 CLINICAL DATA:  Sepsis EXAM: PORTABLE CHEST 1 VIEW COMPARISON:  05/24/2017, 06/21/2016 FINDINGS: Left-sided pacing device. Cardiomegaly with mild central congestion. Aortic atherosclerosis. Mild increased opacity at the left lung base. No pneumothorax. IMPRESSION: Mild left lung base opacity, could reflect atelectasis or infiltrate. Stable cardiomegaly. Electronically Signed    By: Jasmine PangKim  Fujinaga M.D.   On: 05/25/2017 02:16   Dg Chest Port 1 View  Result Date: 05/24/2017 CLINICAL DATA:  Short of breath last night with fever today. EXAM: PORTABLE CHEST 1 VIEW COMPARISON:  06/21/2016 FINDINGS: Cardiac silhouette is mildly enlarged. No mediastinal or hilar masses. No evidence of adenopathy. There are fine reticular type opacities most evident at the peripheral right upper lobe. Lungs otherwise clear, with no evidence of pneumonia or pulmonary edema. No pleural effusion or pneumothorax. Left anterior chest wall pacemaker is stable. Skeletal structures are demineralized but grossly intact. IMPRESSION: No acute cardiopulmonary disease. Electronically Signed   By: Amie Portlandavid  Ormond M.D.   On: 05/24/2017 17:17    Assessment and Plan:   1. Suspected vegetation, bacteremia TTE showed normal EF, RV volume overload, and mild to moderate MR. Will proceed with TEE for definitive visualization of her valves to rule out valvular endocarditis. The source of her bacteremia is unclear. She only complains of congestion. Wheezes heard in bilateral lungs. Plan for TEE tomorrow, NPO at midnight.   2. Chronic diastolic heart failure Her dry weight at her last CHF was 167 lbs. On admission, she was 171 lbs and 174 lbs today. She does not appear to be volume overloaded and is laying flat on exam. No orthopnea, JVD, or lower extremity edema. However, given fluid boluses and increase in weight, will restart home lasix today at 40 mg daily.    3. Chronic Afib, CHB, PPM in place, on xarelto Continue xarelto for now. PPM functioning.    4. Hypertension, hypotension on admission Home medications norvasc, bystolic - on hold for hypotension. Pressures are much better today. Will start lasix first, then follow with beta blocker if pressure tolerates.   5. Hyperlipidemia Continue crestor.   For questions or updates, please contact [facility] Please consult [email or web address] for contact info under  Cardiology/STEMI.   Signed, Roe Rutherfordngela Nicole Duke, PA  05/26/2017 7:55 AM

## 2017-05-26 NOTE — Progress Notes (Signed)
Patient is scheduled for TEE @ [facility] on 3.7.19 @ 0900. [facility]. Spoke with Colin MuldersBrianna, RN about setting up [facility] transport.

## 2017-05-26 NOTE — Progress Notes (Signed)
INFECTIOUS DISEASE PROGRESS NOTE  ID: Beth Santiago is a 84 y.o. female with  Principal Problem:   SIRS (systemic inflammatory response syndrome) (HCC) Active Problems:   HYPERTENSION, BENIGN   CAD, NATIVE VESSEL   PACEMAKER-[employer]   Atrial fibrillation (HCC)   Chronic diastolic CHF (congestive heart failure) (HCC)  Subjective: No complaints  Eating well.   Abtx:  Anti-infectives (From admission, onward)   Start     Dose/Rate Route Frequency Ordered Stop   05/25/17 1400  vancomycin (VANCOCIN) IVPB 750 mg/150 ml premix  Status:  Discontinued     750 mg 150 mL/hr over 60 Minutes Intravenous Every 24 hours 05/25/17 0212 05/25/17 1728   05/25/17 0300  piperacillin-tazobactam (ZOSYN) IVPB 3.375 g     3.375 g 12.5 mL/hr over 240 Minutes Intravenous Every 8 hours 05/25/17 0212     05/24/17 2300  vancomycin (VANCOCIN) IVPB 1000 mg/200 mL premix     1,000 mg 200 mL/hr over 60 Minutes Intravenous  Once 05/24/17 2255 05/25/17 0000   05/24/17 1845  piperacillin-tazobactam (ZOSYN) IVPB 3.375 g     3.375 g 100 mL/hr over 30 Minutes Intravenous  Once 05/24/17 1834 05/24/17 2008      Medications:  Scheduled: . aspirin EC  81 mg Oral QPM  . latanoprost  1 drop Both Eyes QHS  . levothyroxine  88 mcg Oral Daily  . pantoprazole  40 mg Oral Daily  . rivaroxaban  20 mg Oral Q supper  . rosuvastatin  10 mg Oral Once per day on Mon Wed Fri  . timolol  1 drop Both Eyes q morning - 10a    Objective: Vital signs in last 24 hours: Temp:  [98.1 F (36.7 C)] 98.1 F (36.7 C) (03/06 0542) Pulse Rate:  [62-71] 62 (03/06 0542) Resp:  [16-20] 16 (03/06 0542) BP: (134-140)/(63-75) 134/63 (03/06 0542) SpO2:  [97 %] 97 % (03/06 0542) Weight:  [79.1 kg (174 lb 6.1 oz)] 79.1 kg (174 lb 6.1 oz) (03/06 0500)   General appearance: alert, cooperative and no distress Resp: wheezes anterior - bilateral and mild Cardio: regular rate and rhythm GI: normal findings: bowel sounds normal and  soft, non-tender Extremities: edema none  Lab Results Recent Labs    05/25/17 0513 05/26/17 0534  WBC 9.3 6.7  HGB 12.0 11.6*  HCT 36.0 35.6*  NA 135 136  K 3.2* 3.9  CL 103 106  CO2 23 23  BUN 19 14  CREATININE 0.79 0.73   Liver Panel Recent Labs    05/24/17 1839 05/25/17 0513  PROT 5.8* 5.6*  ALBUMIN 3.0* 2.9*  AST 57* 39  ALT 12* 19  ALKPHOS 57 57  BILITOT 1.9* 1.4*   Sedimentation Rate No results for input(s): ESRSEDRATE in the last 72 hours. C-Reactive Protein No results for input(s): CRP in the last 72 hours.  Microbiology: Recent Results (from the past 240 hour(s))  Culture, blood (routine x 2)     Status: Abnormal (Preliminary result)   Collection Time: 05/24/17  4:54 PM  Result Value Ref Range Status   Specimen Description   Final    BLOOD BLOOD LEFT HAND Performed at [facility], [address], [place], [place] [address]    Special Requests   Final    IN PEDIATRIC BOTTLE Blood Culture adequate volume Performed at [facility], [address] [place], [place] [address]    Culture  Setup Time   Final    GRAM POSITIVE COCCI Romie Minus  NEGATIVE RODS IN PEDIATRIC BOTTLE CRITICAL RESULT CALLED TO, READ BACK BY AND VERIFIED WITH: N. GLOGOVAC, RPHARMD (WL) AT 1000 ON 05/25/17 BY C. JESSUP, MLT.  Performed at [facility], [address], [place], [place] [address]    Culture ENTEROCOCCUS FAECALIS GRAM NEGATIVE RODS  (A)  Final   Report Status PENDING  Incomplete  Blood Culture ID Panel (Reflexed)     Status: Abnormal   Collection Time: 05/24/17  4:54 PM  Result Value Ref Range Status   Enterococcus species DETECTED (A) NOT DETECTED Final    Comment: CRITICAL RESULT CALLED TO, READ BACK BY AND VERIFIED WITH: N. GLOGOVAC, RPHARMD(WL) AT 1000 ON 3/5/119 BY C. JESSUP, MLT.    Vancomycin resistance NOT DETECTED NOT DETECTED Final   Listeria monocytogenes NOT DETECTED NOT DETECTED Final   Staphylococcus species NOT  DETECTED NOT DETECTED Final   Staphylococcus aureus NOT DETECTED NOT DETECTED Final   Streptococcus species NOT DETECTED NOT DETECTED Final   Streptococcus agalactiae NOT DETECTED NOT DETECTED Final   Streptococcus pneumoniae NOT DETECTED NOT DETECTED Final   Streptococcus pyogenes NOT DETECTED NOT DETECTED Final   Acinetobacter baumannii NOT DETECTED NOT DETECTED Final   Enterobacteriaceae species DETECTED (A) NOT DETECTED Final    Comment: Enterobacteriaceae represent a large family of gram-negative bacteria, not a single organism. CRITICAL RESULT CALLED TO, READ BACK BY AND VERIFIED WITH: N. GLOGOVAC, RPHARM (WL) AT 1000 ON 05/25/17 BY C. JESSUP, MLT.    Enterobacter cloacae complex NOT DETECTED NOT DETECTED Final   Escherichia coli DETECTED (A) NOT DETECTED Final    Comment: CRITICAL RESULT CALLED TO, READ BACK BY AND VERIFIED WITH: N. GLOGOVAC, RPHARMD (WL) AT 1000 ON 05/25/17 BY C. JESSUP, MLT.    Klebsiella oxytoca NOT DETECTED NOT DETECTED Final   Klebsiella pneumoniae NOT DETECTED NOT DETECTED Final   Proteus species NOT DETECTED NOT DETECTED Final   Serratia marcescens NOT DETECTED NOT DETECTED Final   Carbapenem resistance NOT DETECTED NOT DETECTED Final   Haemophilus influenzae NOT DETECTED NOT DETECTED Final   Neisseria meningitidis NOT DETECTED NOT DETECTED Final   Pseudomonas aeruginosa NOT DETECTED NOT DETECTED Final   Candida albicans NOT DETECTED NOT DETECTED Final   Candida glabrata NOT DETECTED NOT DETECTED Final   Candida krusei NOT DETECTED NOT DETECTED Final   Candida parapsilosis NOT DETECTED NOT DETECTED Final   Candida tropicalis NOT DETECTED NOT DETECTED Final    Comment: Performed at [facility], [address], [place], [place] [address]  Culture, blood (routine x 2)     Status: Abnormal (Preliminary result)   Collection Time: 05/24/17  7:31 PM  Result Value Ref Range Status   Specimen Description   Final    BLOOD LEFT HAND Performed at [facility], [address] [place], [place] [address]    Special Requests   Final    IN PEDIATRIC BOTTLE Blood Culture adequate volume Performed at [facility], [address], [place], [place] [address]    Culture  Setup Time   Final    GRAM POSITIVE COCCI IN PEDIATRIC BOTTLE Performed at [facility], [address], [place], [place] [address]    Culture ENTEROCOCCUS FAECALIS (A)  Final   Report Status PENDING  Incomplete  Culture, Urine     Status: None   Collection Time: 05/25/17  6:40 AM  Result Value Ref Range Status   Specimen Description   Final    URINE,  CLEAN CATCH Performed at [facility], [address] [place], [place] [address]    Special Requests   Final    NONE Performed at [facility], [address] [place], [place] [address]    Culture   Final    NO GROWTH Performed at [facility], [address] [place], [place] [address]    Report Status 05/26/2017 FINAL  Final    Studies/Results: Dg Chest 2 View  Result Date: 05/25/2017 CLINICAL DATA:  Expiratory wheezing on the left. EXAM: CHEST  2 VIEW COMPARISON:  Earlier same day.  05/24/2017. FINDINGS: Cardiomegaly. Aortic atherosclerosis. Dual lead pacemaker. Interstitial lung markings which are worsened and presumably reflect early interstitial edema. Small amount of fluid in the fissures and in the posterior costophrenic angle. No consolidation or collapse. IMPRESSION: Development of abnormal interstitial pattern consistent with interstitial edema. No consolidation or collapse. Electronically Signed   By: Paulina FusiMark  Shogry M.D.   On: 05/25/2017 11:03   [place] Renal  Result Date: 05/25/2017 CLINICAL DATA:  Urinary tract infection, history hypertension EXAM: RENAL / URINARY TRACT ULTRASOUND COMPLETE COMPARISON:  None FINDINGS: Right Kidney: Length: 11.8 cm. Normal cortical thickness and echogenicity for age. Small peripelvic renal cyst 2.1 x  1.3 x 1.6 cm, mildly complicated by scattered internal echoes. No additional mass, hydronephrosis or shadowing calcification. Left Kidney: Length: 11.3 cm. Normal cortical thickness and echogenicity. No mass, hydronephrosis or shadowing calcification. Bladder: Normal appearance IMPRESSION: Mildly complicated RIGHT renal cyst 2.1 cm in greatest diameter. Otherwise negative exam. Electronically Signed   By: Ulyses SouthwardMark  Boles M.D.   On: 05/25/2017 12:50   Dg Chest Port 1 View  Result Date: 05/25/2017 CLINICAL DATA:  Sepsis EXAM: PORTABLE CHEST 1 VIEW COMPARISON:  05/24/2017, 06/21/2016 FINDINGS: Left-sided pacing device. Cardiomegaly with mild central congestion. Aortic atherosclerosis. Mild increased opacity at the left lung base. No pneumothorax. IMPRESSION: Mild left lung base opacity, could reflect atelectasis or infiltrate. Stable cardiomegaly. Electronically Signed   By: Jasmine PangKim  Fujinaga M.D.   On: 05/25/2017 02:16     Assessment/Plan: Sepsis Enterococcal bacteremia GNR bacteremia Pacemaker  She is to have TEE in AM Continue zosyn Await sensi, hopefully change to unasyn Repeat BCx sent this AM Appreciate CV eval.   Total days of antibiotics: 2 zosyn         Johny SaxJeffrey Hatcher MD, FACP Infectious Diseases (pager) [phone] [email or web address] 05/26/2017, 6:45 PM  LOS: 1 day

## 2017-05-26 NOTE — Progress Notes (Signed)
    [facility] has been requested to perform a transesophageal echocardiogram on Beth Santiago for endocarditis.  After careful review of history and examination, the risks and benefits of transesophageal echocardiogram have been explained including risks of esophageal damage, perforation (1:10,000 risk), bleeding, pharyngeal hematoma as well as other potential complications associated with conscious sedation including aspiration, arrhythmia, respiratory failure and death. Alternatives to treatment were discussed, questions were answered. Patient is willing to proceed.   Pt is scheduled for TEE tomorrow 05/27/17 at 9AM with Dr. Shirlee LatchMcLean. NPO at [place].   Roe Rutherfordngela Nicole Duke, [place]  05/26/2017 11:10 AM

## 2017-05-26 NOTE — H&P (View-Only) (Signed)
INFECTIOUS DISEASE PROGRESS NOTE  ID: Beth Santiago is a 84 y.o. female with  Principal Problem:   SIRS (systemic inflammatory response syndrome) (HCC) Active Problems:   HYPERTENSION, BENIGN   CAD, NATIVE VESSEL   PACEMAKER-[employer]   Atrial fibrillation (HCC)   Chronic diastolic CHF (congestive heart failure) (HCC)  Subjective: No complaints  Eating well.   Abtx:  Anti-infectives (From admission, onward)   Start     Dose/Rate Route Frequency Ordered Stop   05/25/17 1400  vancomycin (VANCOCIN) IVPB 750 mg/150 ml premix  Status:  Discontinued     750 mg 150 mL/hr over 60 Minutes Intravenous Every 24 hours 05/25/17 0212 05/25/17 1728   05/25/17 0300  piperacillin-tazobactam (ZOSYN) IVPB 3.375 g     3.375 g 12.5 mL/hr over 240 Minutes Intravenous Every 8 hours 05/25/17 0212     05/24/17 2300  vancomycin (VANCOCIN) IVPB 1000 mg/200 mL premix     1,000 mg 200 mL/hr over 60 Minutes Intravenous  Once 05/24/17 2255 05/25/17 0000   05/24/17 1845  piperacillin-tazobactam (ZOSYN) IVPB 3.375 g     3.375 g 100 mL/hr over 30 Minutes Intravenous  Once 05/24/17 1834 05/24/17 2008      Medications:  Scheduled: . aspirin EC  81 mg Oral QPM  . latanoprost  1 drop Both Eyes QHS  . levothyroxine  88 mcg Oral Daily  . pantoprazole  40 mg Oral Daily  . rivaroxaban  20 mg Oral Q supper  . rosuvastatin  10 mg Oral Once per day on Mon Wed Fri  . timolol  1 drop Both Eyes q morning - 10a    Objective: Vital signs in last 24 hours: Temp:  [98.1 F (36.7 C)] 98.1 F (36.7 C) (03/06 0542) Pulse Rate:  [62-71] 62 (03/06 0542) Resp:  [16-20] 16 (03/06 0542) BP: (134-140)/(63-75) 134/63 (03/06 0542) SpO2:  [97 %] 97 % (03/06 0542) Weight:  [79.1 kg (174 lb 6.1 oz)] 79.1 kg (174 lb 6.1 oz) (03/06 0500)   General appearance: alert, cooperative and no distress Resp: wheezes anterior - bilateral and mild Cardio: regular rate and rhythm GI: normal findings: bowel sounds normal and  soft, non-tender Extremities: edema none  Lab Results Recent Labs    05/25/17 0513 05/26/17 0534  WBC 9.3 6.7  HGB 12.0 11.6*  HCT 36.0 35.6*  NA 135 136  K 3.2* 3.9  CL 103 106  CO2 23 23  BUN 19 14  CREATININE 0.79 0.73   Liver Panel Recent Labs    05/24/17 1839 05/25/17 0513  PROT 5.8* 5.6*  ALBUMIN 3.0* 2.9*  AST 57* 39  ALT 12* 19  ALKPHOS 57 57  BILITOT 1.9* 1.4*   Sedimentation Rate No results for input(s): ESRSEDRATE in the last 72 hours. C-Reactive Protein No results for input(s): CRP in the last 72 hours.  Microbiology: Recent Results (from the past 240 hour(s))  Culture, blood (routine x 2)     Status: Abnormal (Preliminary result)   Collection Time: 05/24/17  4:54 PM  Result Value Ref Range Status   Specimen Description   Final    BLOOD BLOOD LEFT HAND Performed at [facility], [address], [place], [place] [address]    Special Requests   Final    IN PEDIATRIC BOTTLE Blood Culture adequate volume Performed at [facility], [address] [place], [place] [address]    Culture  Setup Time   Final    GRAM POSITIVE COCCI Romie Minus  NEGATIVE RODS IN PEDIATRIC BOTTLE CRITICAL RESULT CALLED TO, READ BACK BY AND VERIFIED WITH: N. GLOGOVAC, RPHARMD (WL) AT 1000 ON 05/25/17 BY C. JESSUP, MLT.  Performed at [facility], [address], [place], [place] [address]    Culture ENTEROCOCCUS FAECALIS GRAM NEGATIVE RODS  (A)  Final   Report Status PENDING  Incomplete  Blood Culture ID Panel (Reflexed)     Status: Abnormal   Collection Time: 05/24/17  4:54 PM  Result Value Ref Range Status   Enterococcus species DETECTED (A) NOT DETECTED Final    Comment: CRITICAL RESULT CALLED TO, READ BACK BY AND VERIFIED WITH: N. GLOGOVAC, RPHARMD(WL) AT 1000 ON 3/5/119 BY C. JESSUP, MLT.    Vancomycin resistance NOT DETECTED NOT DETECTED Final   Listeria monocytogenes NOT DETECTED NOT DETECTED Final   Staphylococcus species NOT  DETECTED NOT DETECTED Final   Staphylococcus aureus NOT DETECTED NOT DETECTED Final   Streptococcus species NOT DETECTED NOT DETECTED Final   Streptococcus agalactiae NOT DETECTED NOT DETECTED Final   Streptococcus pneumoniae NOT DETECTED NOT DETECTED Final   Streptococcus pyogenes NOT DETECTED NOT DETECTED Final   Acinetobacter baumannii NOT DETECTED NOT DETECTED Final   Enterobacteriaceae species DETECTED (A) NOT DETECTED Final    Comment: Enterobacteriaceae represent a large family of gram-negative bacteria, not a single organism. CRITICAL RESULT CALLED TO, READ BACK BY AND VERIFIED WITH: N. GLOGOVAC, RPHARM (WL) AT 1000 ON 05/25/17 BY C. JESSUP, MLT.    Enterobacter cloacae complex NOT DETECTED NOT DETECTED Final   Escherichia coli DETECTED (A) NOT DETECTED Final    Comment: CRITICAL RESULT CALLED TO, READ BACK BY AND VERIFIED WITH: N. GLOGOVAC, RPHARMD (WL) AT 1000 ON 05/25/17 BY C. JESSUP, MLT.    Klebsiella oxytoca NOT DETECTED NOT DETECTED Final   Klebsiella pneumoniae NOT DETECTED NOT DETECTED Final   Proteus species NOT DETECTED NOT DETECTED Final   Serratia marcescens NOT DETECTED NOT DETECTED Final   Carbapenem resistance NOT DETECTED NOT DETECTED Final   Haemophilus influenzae NOT DETECTED NOT DETECTED Final   Neisseria meningitidis NOT DETECTED NOT DETECTED Final   Pseudomonas aeruginosa NOT DETECTED NOT DETECTED Final   Candida albicans NOT DETECTED NOT DETECTED Final   Candida glabrata NOT DETECTED NOT DETECTED Final   Candida krusei NOT DETECTED NOT DETECTED Final   Candida parapsilosis NOT DETECTED NOT DETECTED Final   Candida tropicalis NOT DETECTED NOT DETECTED Final    Comment: Performed at [facility], [address], [place], [place] [address]  Culture, blood (routine x 2)     Status: Abnormal (Preliminary result)   Collection Time: 05/24/17  7:31 PM  Result Value Ref Range Status   Specimen Description   Final    BLOOD LEFT HAND Performed at [facility], [address] [place], [place] [address]    Special Requests   Final    IN PEDIATRIC BOTTLE Blood Culture adequate volume Performed at [facility], [address], [place], [place] [address]    Culture  Setup Time   Final    GRAM POSITIVE COCCI IN PEDIATRIC BOTTLE Performed at [facility], [address], [place], [place] [address]    Culture ENTEROCOCCUS FAECALIS (A)  Final   Report Status PENDING  Incomplete  Culture, Urine     Status: None   Collection Time: 05/25/17  6:40 AM  Result Value Ref Range Status   Specimen Description   Final    URINE,  CLEAN CATCH Performed at [facility], [address] [place], [place] [address]    Special Requests   Final    NONE Performed at [facility], [address] [place], [place] [address]    Culture   Final    NO GROWTH Performed at [facility], [address] [place], [place] [address]    Report Status 05/26/2017 FINAL  Final    Studies/Results: Dg Chest 2 View  Result Date: 05/25/2017 CLINICAL DATA:  Expiratory wheezing on the left. EXAM: CHEST  2 VIEW COMPARISON:  Earlier same day.  05/24/2017. FINDINGS: Cardiomegaly. Aortic atherosclerosis. Dual lead pacemaker. Interstitial lung markings which are worsened and presumably reflect early interstitial edema. Small amount of fluid in the fissures and in the posterior costophrenic angle. No consolidation or collapse. IMPRESSION: Development of abnormal interstitial pattern consistent with interstitial edema. No consolidation or collapse. Electronically Signed   By: Paulina FusiMark  Shogry M.D.   On: 05/25/2017 11:03   [place] Renal  Result Date: 05/25/2017 CLINICAL DATA:  Urinary tract infection, history hypertension EXAM: RENAL / URINARY TRACT ULTRASOUND COMPLETE COMPARISON:  None FINDINGS: Right Kidney: Length: 11.8 cm. Normal cortical thickness and echogenicity for age. Small peripelvic renal cyst 2.1 x  1.3 x 1.6 cm, mildly complicated by scattered internal echoes. No additional mass, hydronephrosis or shadowing calcification. Left Kidney: Length: 11.3 cm. Normal cortical thickness and echogenicity. No mass, hydronephrosis or shadowing calcification. Bladder: Normal appearance IMPRESSION: Mildly complicated RIGHT renal cyst 2.1 cm in greatest diameter. Otherwise negative exam. Electronically Signed   By: Ulyses SouthwardMark  Boles M.D.   On: 05/25/2017 12:50   Dg Chest Port 1 View  Result Date: 05/25/2017 CLINICAL DATA:  Sepsis EXAM: PORTABLE CHEST 1 VIEW COMPARISON:  05/24/2017, 06/21/2016 FINDINGS: Left-sided pacing device. Cardiomegaly with mild central congestion. Aortic atherosclerosis. Mild increased opacity at the left lung base. No pneumothorax. IMPRESSION: Mild left lung base opacity, could reflect atelectasis or infiltrate. Stable cardiomegaly. Electronically Signed   By: Jasmine PangKim  Fujinaga M.D.   On: 05/25/2017 02:16     Assessment/Plan: Sepsis Enterococcal bacteremia GNR bacteremia Pacemaker  She is to have TEE in AM Continue zosyn Await sensi, hopefully change to unasyn Repeat BCx sent this AM Appreciate CV eval.   Total days of antibiotics: 2 zosyn         Johny SaxJeffrey Quintana Canelo MD, FACP Infectious Diseases (pager) [phone] [email or web address] 05/26/2017, 6:45 PM  LOS: 1 day

## 2017-05-26 NOTE — Care Management Note (Signed)
Case Management Note  Patient Details  Name: Beth Santiago MRN: [id number] Date of Birth: [birth year]  Subjective/Objective:  PT recc HHPT. Dtr chose [name] aware of HHPT. For TEE in am.                  Action/Plan:d/c home w/HHC.   Expected Discharge Date:                  Expected Discharge Plan:  Home w Home Health Services  In-House Referral:     Discharge planning Services  CM Consult  Post Acute Care Choice:  Durable Medical Equipment(rw) Choice offered to:  Adult Children  DME Arranged:    DME Agency:     HH Arranged:  PT HH Agency:  [facility]  Status of Service:  In process, will continue to follow  If discussed at Long Length of Stay Meetings, dates discussed:    Additional Comments:  Lanier ClamMahabir, Harveer Sadler, RN 05/26/2017, 2:36 PM

## 2017-05-27 ENCOUNTER — Inpatient Hospital Stay (HOSPITAL_COMMUNITY): Payer: Medicare Other

## 2017-05-27 ENCOUNTER — Encounter (HOSPITAL_COMMUNITY)
Admission: EM | Disposition: A | Discharge status: Home Health | Payer: Self-pay | Source: Home / Self Care | Attending: Internal Medicine

## 2017-05-27 ENCOUNTER — Encounter (HOSPITAL_COMMUNITY): Payer: Self-pay | Admitting: *Deleted

## 2017-05-27 DIAGNOSIS — I351 Nonrheumatic aortic (valve) insufficiency: Secondary | ICD-10-CM

## 2017-05-27 DIAGNOSIS — I361 Nonrheumatic tricuspid (valve) insufficiency: Secondary | ICD-10-CM

## 2017-05-27 DIAGNOSIS — Z98818 Other dental procedure status: Secondary | ICD-10-CM

## 2017-05-27 HISTORY — PX: TEE WITHOUT CARDIOVERSION: SHX5443

## 2017-05-27 LAB — BASIC METABOLIC PANEL
ANION GAP: 8 (ref 5–15)
BUN: 10 mg/dL (ref 6–20)
CALCIUM: 8.3 mg/dL — AB (ref 8.9–10.3)
CO2: 25 mmol/L (ref 22–32)
Chloride: 101 mmol/L (ref 101–111)
Creatinine, Ser: 0.61 mg/dL (ref 0.44–1.00)
GFR calc Af Amer: >60 mL/min (ref >60)
GLUCOSE: 114 mg/dL — AB (ref 65–99)
Potassium: 3.2 mmol/L — ABNORMAL LOW (ref 3.5–5.1)
SODIUM: 134 mmol/L — AB (ref 135–145)

## 2017-05-27 LAB — CULTURE, BLOOD (ROUTINE X 2)
SPECIAL REQUESTS: ADEQUATE
Special Requests: ADEQUATE

## 2017-05-27 LAB — PROTIME-INR
INR: 3.54
Prothrombin Time: 35.2 seconds — ABNORMAL HIGH (ref 11.4–15.2)

## 2017-05-27 LAB — CBC
HEMATOCRIT: 35.4 % — AB (ref 36.0–46.0)
HEMOGLOBIN: 11.8 g/dL — AB (ref 12.0–15.0)
MCH: 31.1 pg (ref 26.0–34.0)
MCHC: 33.3 g/dL (ref 30.0–36.0)
MCV: 93.2 fL (ref 78.0–100.0)
Platelets: 131 10*3/uL — ABNORMAL LOW (ref 150–400)
RBC: 3.8 MIL/uL — ABNORMAL LOW (ref 3.87–5.11)
RDW: 14 % (ref 11.5–15.5)
WBC: 6.6 10*3/uL (ref 4.0–10.5)

## 2017-05-27 LAB — MAGNESIUM: MAGNESIUM: 1.7 mg/dL (ref 1.7–2.4)

## 2017-05-27 SURGERY — ECHOCARDIOGRAM, TRANSESOPHAGEAL
Anesthesia: General

## 2017-05-27 MED ORDER — PROPOFOL 500 MG/50ML IV EMUL
INTRAVENOUS | Status: DC | PRN
  Administered 2017-05-27: 100 ug/kg/min via INTRAVENOUS

## 2017-05-27 MED ORDER — PHENOL 1.4 % MT LIQD
1.0000 | OROMUCOSAL | Status: DC | PRN
  Administered 2017-05-27: 1 via OROMUCOSAL
  Filled 2017-05-27: qty 177

## 2017-05-27 MED ORDER — NEBIVOLOL HCL 2.5 MG PO TABS
2.5000 mg | ORAL_TABLET | Freq: Every day | ORAL | Status: DC
  Administered 2017-05-28: 2.5 mg via ORAL
  Filled 2017-05-27: qty 1

## 2017-05-27 MED ORDER — SODIUM CHLORIDE 0.9 % IV SOLN
INTRAVENOUS | Status: DC | PRN
  Administered 2017-05-27: 10:00:00 via INTRAVENOUS

## 2017-05-27 MED ORDER — SODIUM CHLORIDE 0.9 % IV SOLN
INTRAVENOUS | Status: DC
  Administered 2017-05-27: 08:00:00 via INTRAVENOUS

## 2017-05-27 MED ORDER — POTASSIUM CHLORIDE CRYS ER 20 MEQ PO TBCR
40.0000 meq | EXTENDED_RELEASE_TABLET | Freq: Once | ORAL | Status: AC
  Administered 2017-05-27: 40 meq via ORAL
  Filled 2017-05-27: qty 2

## 2017-05-27 MED ORDER — BUTAMBEN-TETRACAINE-BENZOCAINE 2-2-14 % EX AERO
INHALATION_SPRAY | CUTANEOUS | Status: DC | PRN
  Administered 2017-05-27: 2 via TOPICAL

## 2017-05-27 NOTE — Progress Notes (Signed)
Patient not seen because [facility] is at [facility].  No vegetations noted.  We will sign off.  Please call if there are questions.  Thao Bauza C. Duke Salviaandolph, MD, [facility] 05/27/2017 11:56 AM

## 2017-05-27 NOTE — CV Procedure (Signed)
   TEE  Bacteremia  Time out performed  Propofol anesthesia  Findings:  - NO VEGETATIONS (valvular or pacer wires)   - Severe TR, dilated RV/RA  - Normal EF  - Mild AI  - Thickened MV, MAC  - Aortic atherosclerosis  Candee Furbish, MD

## 2017-05-27 NOTE — Progress Notes (Signed)
Physical Therapy Treatment Patient Details Name: Beth Santiago MRN: [id number] DOB: [birth year] Today's Date: 05/27/2017    History of Present Illness Beth Santiago is a 84 y.o. female with history of atrial fibrillation, diastolic CHF, hypothyroidism, complete heart block status post pacemaker placement, hypertension was brought to the ER after patient has been feeling weak for last 24 hours.  Patient symptoms started 2 days ago after visiting discharge.  Patient started having fever chills and felt weak and had to lie on the bed.  Since then patient is feeling weak to stand up.  Has been having some cough denies any chest pain or shortness of breath nausea vomiting abdominal pain.    PT Comments    Pt is making good progress with mobility. Pt's daughter was present during therapy and confirmed she will have assistance after d/c home. Pt does present with some general deconditioning and decreased activity tolerance. Pt ambulated 125 ft with RW close supervision with cues for improved safety. Pt will continue to benefit from acute skilled PT until d/c home with family and HHPT services.  Follow Up Recommendations  Home health PT     Equipment Recommendations  None recommended by PT    Recommendations for Other Services       Precautions / Restrictions Precautions Precautions: Fall Restrictions Weight Bearing Restrictions: No    Mobility  Bed Mobility Overal bed mobility: Needs Assistance Bed Mobility: Supine to Sit     Supine to sit: Supervision     General bed mobility comments: HOB elevated, use of rails  Transfers Overall transfer level: Needs assistance Equipment used: Rolling walker (2 wheeled) Transfers: Sit to/from Stand Sit to Stand: Supervision         General transfer comment: cues for hand placement  Ambulation/Gait Ambulation/Gait assistance: Supervision Ambulation Distance (Feet): 125 Feet Assistive device: Rolling walker (2 wheeled) Gait  Pattern/deviations: Step-through pattern;Decreased stride length;Trunk flexed Gait velocity: decreased   General Gait Details: cues for more upright posture   Stairs            Wheelchair Mobility    Modified Rankin (Stroke Patients Only)       Balance Overall balance assessment: Needs assistance Sitting-balance support: No upper extremity supported Sitting balance-Leahy Scale: Good     Standing balance support: Bilateral upper extremity supported Standing balance-Leahy Scale: Fair                              Cognition Arousal/Alertness: Awake/alert Behavior During Therapy: WFL for tasks assessed/performed Overall Cognitive Status: Within Functional Limits for tasks assessed                                 General Comments: Pt's daughter was present during session. Daughter verified pt will have supervision at d/c and does have a RW to use.       Exercises      General Comments        Pertinent Vitals/Pain Pain Assessment: No/denies pain    Home Living                      Prior Function            PT Goals (current goals can now be found in the care plan section) Progress towards PT goals: Progressing toward goals    Frequency    Min 3X/week  PT Plan Current plan remains appropriate    Co-evaluation              AM-PAC PT "6 Clicks" Daily Activity  Outcome Measure  Difficulty turning over in bed (including adjusting bedclothes, sheets and blankets)?: A Little Difficulty moving from lying on back to sitting on the side of the bed? : A Little Difficulty sitting down on and standing up from a chair with arms (e.g., wheelchair, bedside commode, etc,.)?: A Little Help needed moving to and from a bed to chair (including a wheelchair)?: A Little Help needed walking in hospital room?: A Little Help needed climbing 3-5 steps with a railing? : A Little 6 Click Score: 18    End of Session Equipment  Utilized During Treatment: Gait belt Activity Tolerance: Patient tolerated treatment well Patient left: in bed;with call bell/phone within reach;with family/visitor present Nurse Communication: Mobility status PT Visit Diagnosis: Muscle weakness (generalized) (M62.81);Difficulty in walking, not elsewhere classified (R26.2)     Time: 4098-1191 PT Time Calculation (min) (ACUTE ONLY): 25 min  Charges:  $Gait Training: 23-37 mins                    G Codes:      Lilyan Punt, PT   Greggory Stallion 05/27/2017, 1:33 PM

## 2017-05-27 NOTE — Transfer of Care (Signed)
Immediate Anesthesia Transfer of Care Note  Patient: Beth Santiago  Procedure(s) Performed: TRANSESOPHAGEAL ECHOCARDIOGRAM (TEE) (N/A )  Patient Location: Endoscopy Unit  Anesthesia Type:MAC  Level of Consciousness: awake, alert  and oriented  Airway & Oxygen Therapy: Patient Spontanous Breathing and Patient connected to nasal cannula oxygen  Post-op Assessment: Report given to RN, Post -op Vital signs reviewed and stable and Patient moving all extremities X 4  Post vital signs: Reviewed and stable  Last Vitals:  Vitals:   05/27/17 0422 05/27/17 0810  BP: (!) 120/50 133/62  Pulse: (!) 59 60  Resp: 18 14  Temp: 36.9 C 36.8 C  SpO2: 96% 94%    Last Pain:  Vitals:   05/27/17 0810  TempSrc: Oral  PainSc:          Complications: No apparent anesthesia complications

## 2017-05-27 NOTE — Discharge Instructions (Signed)
Transesophageal Echocardiogram °Transesophageal echocardiography (TEE) is a picture test of your heart using sound waves. The pictures taken can give very detailed pictures of your heart. This can help your doctor see if there are problems with your heart. TEE can check: °· If your heart has blood clots in it. °· How well your heart valves are working. °· If you have an infection on the inside of your heart. °· Some of the major arteries of your heart. °· If your heart valve is working after a repair. °· Your heart before a procedure that uses a shock to your heart to get the rhythm back to normal. ° °What happens before the procedure? °· Do not eat or drink for 6 hours before the procedure or as told by your doctor. °· Make plans to have someone drive you home after the procedure. Do not drive yourself home. °· An IV tube will be put in your arm. °What happens during the procedure? °· You will be given a medicine to help you relax (sedative). It will be given through the IV tube. °· A numbing medicine will be sprayed or gargled in the back of your throat to help numb it. °· The tip of the probe is placed into the back of your mouth. You will be asked to swallow. This helps to pass the probe into your esophagus. °· Once the tip of the probe is in the right place, your doctor can take pictures of your heart. °· You may feel pressure at the back of your throat. °What happens after the procedure? °· You will be taken to a recovery area so the sedative can wear off. °· Your throat may be sore and scratchy. This will go away slowly over time. °· You will go home when you are fully awake and able to swallow liquids. °· You should have someone stay with you for the next 24 hours. °· Do not drive or operate machinery for the next 24 hours. °This information is not intended to replace advice given to you by your health care provider. Make sure you discuss any questions you have with your health care provider. °Document  Released: 01/04/2009 Document Revised: 08/15/2015 Document Reviewed: 09/08/2012 °Elsevier Interactive Patient Education © 2018 [employer] ° °

## 2017-05-27 NOTE — Progress Notes (Signed)
PT Cancellation Note  Patient Details Name: Beth PiggCharlotte Santiago MRN: [id number] DOB: 05/23/1924   Cancelled Treatment:     Pt unavailable for therapy secondary to test/procedure. Will attempt to see patient tomorrow.   Greggory StallionWrisley, Khamille Beynon Kerstine 05/27/2017, 10:26 AM

## 2017-05-27 NOTE — Anesthesia Postprocedure Evaluation (Signed)
Anesthesia Post Note  Patient: Beth Santiago  Procedure(s) Performed: TRANSESOPHAGEAL ECHOCARDIOGRAM (TEE) (N/A )     Patient location during evaluation: PACU Anesthesia Type: MAC Level of consciousness: awake Pain management: pain level controlled Vital Signs Assessment: post-procedure vital signs reviewed and stable Respiratory status: spontaneous breathing Cardiovascular status: stable Postop Assessment: no apparent nausea or vomiting Anesthetic complications: no    Last Vitals:  Vitals:   05/27/17 0810 05/27/17 1009  BP: 133/62 134/66  Pulse: 60 65  Resp: 14 19  Temp: 36.8 C   SpO2: 94% 100%    Last Pain:  Vitals:   05/27/17 0810  TempSrc: Oral  PainSc:    Pain Goal:                 Letroy Vazguez JR,JOHN Tenita Cue

## 2017-05-27 NOTE — Anesthesia Preprocedure Evaluation (Addendum)
Anesthesia Evaluation  Patient identified by MRN, date of birth, ID band Patient awake    Reviewed: Allergy & Precautions, NPO status , Patient's Chart, lab work & pertinent test results, reviewed documented beta blocker date and time   History of Anesthesia Complications Negative for: history of anesthetic complications  Airway Mallampati: I  TM Distance: >3 FB Neck ROM: Full    Dental  (+) Poor Dentition   Pulmonary    Pulmonary exam normal        Cardiovascular hypertension, Pt. on medications and Pt. on home beta blockers + CAD and + Peripheral Vascular Disease  Normal cardiovascular exam+ dysrhythmias Atrial Fibrillation + pacemaker  Rhythm:Regular Rate:Normal  Pacemaker rate and rhythm   Neuro/Psych negative neurological ROS  negative psych ROS   GI/Hepatic Neg liver ROS, GERD  ,  Endo/Other  Hypothyroidism   Renal/GU negative Renal ROS     Musculoskeletal   Abdominal Normal abdominal exam  (+)   Peds  Hematology  (+) Blood dyscrasia, anemia ,   Anesthesia Other Findings   Reproductive/Obstetrics negative OB ROS                             Anesthesia Physical  Anesthesia Plan  ASA: III  Anesthesia Plan: MAC   Post-op Pain Management:    Induction: Intravenous  PONV Risk Score and Plan:   Airway Management Planned: Mask  Additional Equipment:   Intra-op Plan:   Post-operative Plan:   Informed Consent: I have reviewed the patients History and Physical, chart, labs and discussed the procedure including the risks, benefits and alternatives for the proposed anesthesia with the patient or authorized representative who has indicated his/her understanding and acceptance.     Plan Discussed with: CRNA  Anesthesia Plan Comments:        Anesthesia Quick Evaluation

## 2017-05-27 NOTE — Interval H&P Note (Signed)
History and Physical Interval Note:  05/27/2017 9:19 AM  Beth Santiago  has presented today for surgery, with the diagnosis of BACTEREMIA  The various methods of treatment have been discussed with the patient and family. After consideration of risks, benefits and other options for treatment, the patient has consented to  Procedure(s): TRANSESOPHAGEAL ECHOCARDIOGRAM (TEE) (N/A) as a surgical intervention .  The patient's history has been reviewed, patient examined, no change in status, stable for surgery.  I have reviewed the patient's chart and labs.  Questions were answered to the patient's satisfaction.     [employer]Mark Nicky Milhouse

## 2017-05-27 NOTE — Progress Notes (Addendum)
INFECTIOUS DISEASE PROGRESS NOTE  ID: Beth Santiago is a 84 y.o. female with  Principal Problem:   SIRS (systemic inflammatory response syndrome) (HCC) Active Problems:   HYPERTENSION, BENIGN   CAD, NATIVE VESSEL   PACEMAKER-[employer]   Atrial fibrillation (HCC)   Chronic diastolic CHF (congestive heart failure) (HCC)  Subjective: C/o sore throat post-procedure  Abtx:  Anti-infectives (From admission, onward)   Start     Dose/Rate Route Frequency Ordered Stop   05/25/17 1400  vancomycin (VANCOCIN) IVPB 750 mg/150 ml premix  Status:  Discontinued     750 mg 150 mL/hr over 60 Minutes Intravenous Every 24 hours 05/25/17 0212 05/25/17 1728   05/25/17 0300  piperacillin-tazobactam (ZOSYN) IVPB 3.375 g     3.375 g 12.5 mL/hr over 240 Minutes Intravenous Every 8 hours 05/25/17 0212     05/24/17 2300  vancomycin (VANCOCIN) IVPB 1000 mg/200 mL premix     1,000 mg 200 mL/hr over 60 Minutes Intravenous  Once 05/24/17 2255 05/25/17 0000   05/24/17 1845  piperacillin-tazobactam (ZOSYN) IVPB 3.375 g     3.375 g 100 mL/hr over 30 Minutes Intravenous  Once 05/24/17 1834 05/24/17 2008      Medications:  Scheduled: . aspirin EC  81 mg Oral QPM  . latanoprost  1 drop Both Eyes QHS  . levothyroxine  88 mcg Oral Daily  . pantoprazole  40 mg Oral Daily  . rivaroxaban  20 mg Oral Q supper  . rosuvastatin  10 mg Oral Once per day on Mon Wed Fri  . timolol  1 drop Both Eyes q morning - 10a    Objective: Vital signs in last 24 hours: Temp:  [98.1 F (36.7 C)-98.4 F (36.9 C)] 98.1 F (36.7 C) (03/07 1009) Pulse Rate:  [59-65] 63 (03/07 1017) Resp:  [14-19] 17 (03/07 1017) BP: (110-134)/(50-75) 124/75 (03/07 1017) SpO2:  [94 %-100 %] 96 % (03/07 1017) Weight:  [73.7 kg (162 lb 7.7 oz)] 73.7 kg (162 lb 7.7 oz) (03/07 0810)   General appearance: alert, cooperative and no distress Resp: clear to auscultation bilaterally Chest wall: no tenderness, L anterior chest pacer site, no  flucutance.  Cardio: regular rate and rhythm GI: normal findings: bowel sounds normal and soft, non-tender  Lab Results Recent Labs    05/26/17 0534 05/27/17 0525  WBC 6.7 6.6  HGB 11.6* 11.8*  HCT 35.6* 35.4*  NA 136 134*  K 3.9 3.2*  CL 106 101  CO2 23 25  BUN 14 10  CREATININE 0.73 0.61   Liver Panel Recent Labs    05/24/17 1839 05/25/17 0513  PROT 5.8* 5.6*  ALBUMIN 3.0* 2.9*  AST 57* 39  ALT 12* 19  ALKPHOS 57 57  BILITOT 1.9* 1.4*   Sedimentation Rate No results for input(s): ESRSEDRATE in the last 72 hours. C-Reactive Protein No results for input(s): CRP in the last 72 hours.  Microbiology: Recent Results (from the past 240 hour(s))  Culture, blood (routine x 2)     Status: Abnormal   Collection Time: 05/24/17  4:54 PM  Result Value Ref Range Status   Specimen Description   Final    BLOOD BLOOD LEFT HAND Performed at [facility], [address] [place], [place] [address]    Special Requests   Final    IN PEDIATRIC BOTTLE Blood Culture adequate volume Performed at [facility], [address], [place], [place] [address]    Culture  Setup Time   Final  GRAM POSITIVE COCCI GRAM NEGATIVE RODS IN PEDIATRIC BOTTLE CRITICAL RESULT CALLED TO, READ BACK BY AND VERIFIED WITH: N. GLOGOVAC, RPHARMD (WL) AT 1000 ON 05/25/17 BY C. JESSUP, MLT.  Performed at [facility], [address] [place], [place] [address]    Culture (A)  Final    ENTEROCOCCUS FAECALIS GRAM NEGATIVE RODS NONVIABLE FOR WORKUP    Report Status 05/27/2017 FINAL  Final   Organism ID, Bacteria ENTEROCOCCUS FAECALIS  Final      Susceptibility   Enterococcus faecalis - MIC*    AMPICILLIN <=2 SENSITIVE Sensitive     VANCOMYCIN 1 SENSITIVE Sensitive     GENTAMICIN SYNERGY SENSITIVE Sensitive     * ENTEROCOCCUS FAECALIS  Blood Culture ID Panel (Reflexed)     Status: Abnormal   Collection Time: 05/24/17  4:54 PM  Result Value Ref Range Status    Enterococcus species DETECTED (A) NOT DETECTED Final    Comment: CRITICAL RESULT CALLED TO, READ BACK BY AND VERIFIED WITH: N. GLOGOVAC, RPHARMD(WL) AT 1000 ON 3/5/119 BY C. JESSUP, MLT.    Vancomycin resistance NOT DETECTED NOT DETECTED Final   Listeria monocytogenes NOT DETECTED NOT DETECTED Final   Staphylococcus species NOT DETECTED NOT DETECTED Final   Staphylococcus aureus NOT DETECTED NOT DETECTED Final   Streptococcus species NOT DETECTED NOT DETECTED Final   Streptococcus agalactiae NOT DETECTED NOT DETECTED Final   Streptococcus pneumoniae NOT DETECTED NOT DETECTED Final   Streptococcus pyogenes NOT DETECTED NOT DETECTED Final   Acinetobacter baumannii NOT DETECTED NOT DETECTED Final   Enterobacteriaceae species DETECTED (A) NOT DETECTED Final    Comment: Enterobacteriaceae represent a large family of gram-negative bacteria, not a single organism. CRITICAL RESULT CALLED TO, READ BACK BY AND VERIFIED WITH: N. GLOGOVAC, RPHARM (WL) AT 1000 ON 05/25/17 BY C. JESSUP, MLT.    Enterobacter cloacae complex NOT DETECTED NOT DETECTED Final   Escherichia coli DETECTED (A) NOT DETECTED Final    Comment: CRITICAL RESULT CALLED TO, READ BACK BY AND VERIFIED WITH: N. GLOGOVAC, RPHARMD (WL) AT 1000 ON 05/25/17 BY C. JESSUP, MLT.    Klebsiella oxytoca NOT DETECTED NOT DETECTED Final   Klebsiella pneumoniae NOT DETECTED NOT DETECTED Final   Proteus species NOT DETECTED NOT DETECTED Final   Serratia marcescens NOT DETECTED NOT DETECTED Final   Carbapenem resistance NOT DETECTED NOT DETECTED Final   Haemophilus influenzae NOT DETECTED NOT DETECTED Final   Neisseria meningitidis NOT DETECTED NOT DETECTED Final   Pseudomonas aeruginosa NOT DETECTED NOT DETECTED Final   Candida albicans NOT DETECTED NOT DETECTED Final   Candida glabrata NOT DETECTED NOT DETECTED Final   Candida krusei NOT DETECTED NOT DETECTED Final   Candida parapsilosis NOT DETECTED NOT DETECTED Final   Candida tropicalis NOT  DETECTED NOT DETECTED Final    Comment: Performed at [facility], [address] [place], [place] [address]  Culture, blood (routine x 2)     Status: Abnormal   Collection Time: 05/24/17  7:31 PM  Result Value Ref Range Status   Specimen Description   Final    BLOOD LEFT HAND Performed at [facility], [address] [place], [place] [address]    Special Requests   Final    IN PEDIATRIC BOTTLE Blood Culture adequate volume Performed at [facility], [address] [place], [place] [address]    Culture  Setup Time GRAM POSITIVE COCCI IN PEDIATRIC BOTTLE   Final   Culture (A)  Final  ENTEROCOCCUS FAECALIS SUSCEPTIBILITIES PERFORMED ON PREVIOUS CULTURE WITHIN THE LAST 5 DAYS. Performed at [facility], [address], [place], [place] [address]    Report Status 05/27/2017 FINAL  Final  Culture, Urine     Status: None   Collection Time: 05/25/17  6:40 AM  Result Value Ref Range Status   Specimen Description   Final    URINE, CLEAN CATCH Performed at [facility], [address], [place], [place] [address]    Special Requests   Final    NONE Performed at [facility], [address] [place], [place] [address]    Culture   Final    NO GROWTH Performed at [facility], [address], [place], [place] [address]    Report Status 05/26/2017 FINAL  Final  Culture, blood (routine x 2)     Status: None (Preliminary result)   Collection Time: 05/26/17  5:27 AM  Result Value Ref Range Status   Specimen Description   Final    BLOOD RIGHT ANTECUBITAL Performed at [facility], [address], [place], [place] [address]    Special Requests   Final    BOTTLES DRAWN AEROBIC AND ANAEROBIC Blood Culture adequate volume Performed at [facility], [address] [place], [place] [address]    Culture   Final    NO GROWTH 1 DAY Performed at [facility], [address], [place], [place] [address]    Report Status PENDING  Incomplete  Culture, blood (routine x 2)     Status: None (Preliminary result)   Collection Time: 05/26/17  5:34 AM  Result Value Ref Range Status   Specimen Description   Final    BLOOD RIGHT HAND Performed at [facility], [address] [place], [place] [address]    Special Requests   Final    BOTTLES DRAWN AEROBIC AND ANAEROBIC Blood Culture adequate volume Performed at [facility], [address], [place], [place] [address]    Culture   Final    NO GROWTH 1 DAY Performed at [facility], [address], [place], [place] [address]    Report Status PENDING  Incomplete    Studies/Results: US Renal  Result Date: 05/25/2017 CLINICAL DATA:  Urinary tract infection, history hypertension EXAM: RENAL / URINARY TRACT ULTRASOUND COMPLETE COMPARISON:  None FINDINGS: Right Kidney: Length: 11.8 cm. Normal cortical thickness and echogenicity for age. Small peripelvic renal cyst 2.1 x 1.3 x 1.6 cm, mildly complicated by scattered internal echoes. No additional mass, hydronephrosis or shadowing calcification. Left Kidney: Length: 11.3 cm. Normal cortical thickness and echogenicity. No mass, hydronephrosis or shadowing calcification. Bladder: Normal appearance IMPRESSION: Mildly complicated RIGHT renal cyst 2.1 cm in greatest diameter. Otherwise negative exam. Electronically Signed   By: Ulyses Southward M.D.   On: 05/25/2017 12:50     Assessment/Plan: Sepsis Enterococcal bacteremia GNR bacteremia Pacemaker Recent dental procedure (cleaning, minimal bleeding per pt 05-21-17)  Total days of antibiotics: 3 zosyn  TEE negative Would give her 2 weeks of IV anbx followed by po anbx given her bacteremia and presence of prosthetic.  await further ID of her GNR for final anbx rec. Her source could be dental   Explained to pt and her daughter         Johny Sax MD,  FACP Infectious Diseases (pager) [phone] [email or web address] 05/27/2017, 12:38 PM  LOS: 2 days

## 2017-05-27 NOTE — Progress Notes (Signed)
PROGRESS NOTE  Beth Santiago ZOX:[id number] DOB: 03-26-1924 DOA: 05/24/2017 PCP: Adrian Prince, MD  HPI/Recap of past 24 hours:  Patient seen after returning from TEE  feeling better, no fever, denies pain Daughter at bedside  Assessment/Plan: Principal Problem:   SIRS (systemic inflammatory response syndrome) (HCC) Active Problems:   HYPERTENSION, BENIGN   CAD, NATIVE VESSEL   PACEMAKER-[employer]   Atrial fibrillation (HCC)   Chronic diastolic CHF (congestive heart failure) (HCC)  Bacteremia from Enterococcus and GNR /sepsis presented on admission with leukocytosis lactic acidosis hypotension Unclear source, Patient reports recent dental procedure Currently on Zosyn Infectious disease consulted, will follow recommendation  Possible acute on chronic diastolic CHF She reports some short of breath this afternoon DC IV fluids, 1 dose IV Lasix Euvolemic today   Hypokalemia, replace K, PT in a.m.  Chronic A. fib on Xarelto, History of complete heart block status post pacemaker Rate controlled  Home meds Bystolic held due to hypotension, resume Bystolic 2.5 mg from tomorrow   Hypertension Resented with hypotension and sepsis Meds Norvasc held since admission BP improving ,resume Bystolic tomorrow     Code Status: full  Family Communication: patient and daughter  Disposition Plan: not ready for discharge   Consultants:  Infectious disease  Cardiology  Procedures:  TEE on 3/7  Antibiotics:  As above   Objective: BP 121/69 (BP Location: Left Arm)   Pulse 63   Temp 97.9 F (36.6 C) (Oral)   Resp 18   Ht 5\' 6"  (1.676 m)   Wt 73.7 kg (162 lb 7.7 oz)   SpO2 98%   BMI 26.22 kg/m   Intake/Output Summary (Last 24 hours) at 05/27/2017 1930 Last data filed at 05/27/2017 1300 Gross per 24 hour  Intake 500 ml  Output 400 ml  Net 100 ml   Filed Weights   05/26/17 0500 05/27/17 0421 05/27/17 0810  Weight: 79.1 kg (174 lb 6.1 oz) 73.7 kg (162 lb  7.7 oz) 73.7 kg (162 lb 7.7 oz)    Exam: Patient is examined daily including today on 05/27/2017, exams remain the same as of yesterday except that has changed    General:  NAD  Cardiovascular: paced rhythm  Respiratory: CTABL  Abdomen: Soft/ND/NT, positive BS  Musculoskeletal: No Edema  Neuro: alert, oriented   Data Reviewed: Basic Metabolic Panel: Recent Labs  Lab 05/24/17 1839 05/25/17 0513 05/26/17 0534 05/27/17 0525  NA 135 135 136 134*  K 3.9 3.2* 3.9 3.2*  CL 104 103 106 101  CO2 22 23 23 25   GLUCOSE 169* 138* 133* 114*  BUN 19 19 14 10   CREATININE 0.80 0.79 0.73 0.61  CALCIUM 8.1* 8.3* 8.5* 8.3*  MG  --   --  1.9 1.7   Liver Function Tests: Recent Labs  Lab 05/24/17 1839 05/25/17 0513  AST 57* 39  ALT 12* 19  ALKPHOS 57 57  BILITOT 1.9* 1.4*  PROT 5.8* 5.6*  ALBUMIN 3.0* 2.9*   No results for input(s): LIPASE, AMYLASE in the last 168 hours. No results for input(s): AMMONIA in the last 168 hours. CBC: Recent Labs  Lab 05/24/17 1654 05/25/17 0513 05/26/17 0534 05/27/17 0525  WBC 14.1* 9.3 6.7 6.6  NEUTROABS 13.0* 8.0*  --   --   HGB 12.9 12.0 11.6* 11.8*  HCT 37.1 36.0 35.6* 35.4*  MCV 90.7 91.6 94.2 93.2  PLT 222 120* 107* 131*   Cardiac Enzymes:   No results for input(s): CKTOTAL, CKMB, CKMBINDEX, TROPONINI in the last 168  hours. BNP (last 3 results) No results for input(s): BNP in the last 8760 hours.  ProBNP (last 3 results) No results for input(s): PROBNP in the last 8760 hours.  CBG: No results for input(s): GLUCAP in the last 168 hours.  Recent Results (from the past 240 hour(s))  Culture, blood (routine x 2)     Status: Abnormal   Collection Time: 05/24/17  4:54 PM  Result Value Ref Range Status   Specimen Description   Final    BLOOD BLOOD LEFT HAND Performed at [facility], [address] [place], [place] [address]    Special Requests   Final    IN PEDIATRIC BOTTLE Blood Culture adequate  volume Performed at [facility], [address], [place], [place] [address]    Culture  Setup Time   Final    GRAM POSITIVE COCCI GRAM NEGATIVE RODS IN PEDIATRIC BOTTLE CRITICAL RESULT CALLED TO, READ BACK BY AND VERIFIED WITH: N. GLOGOVAC, RPHARMD (WL) AT 1000 ON 05/25/17 BY C. JESSUP, MLT.  Performed at [facility], [address], [place], [place] [address]    Culture (A)  Final    ENTEROCOCCUS FAECALIS GRAM NEGATIVE RODS NONVIABLE FOR WORKUP    Report Status 05/27/2017 FINAL  Final   Organism ID, Bacteria ENTEROCOCCUS FAECALIS  Final      Susceptibility   Enterococcus faecalis - MIC*    AMPICILLIN <=2 SENSITIVE Sensitive     VANCOMYCIN 1 SENSITIVE Sensitive     GENTAMICIN SYNERGY SENSITIVE Sensitive     * ENTEROCOCCUS FAECALIS  Blood Culture ID Panel (Reflexed)     Status: Abnormal   Collection Time: 05/24/17  4:54 PM  Result Value Ref Range Status   Enterococcus species DETECTED (A) NOT DETECTED Final    Comment: CRITICAL RESULT CALLED TO, READ BACK BY AND VERIFIED WITH: N. GLOGOVAC, RPHARMD(WL) AT 1000 ON 3/5/119 BY C. JESSUP, MLT.    Vancomycin resistance NOT DETECTED NOT DETECTED Final   Listeria monocytogenes NOT DETECTED NOT DETECTED Final   Staphylococcus species NOT DETECTED NOT DETECTED Final   Staphylococcus aureus NOT DETECTED NOT DETECTED Final   Streptococcus species NOT DETECTED NOT DETECTED Final   Streptococcus agalactiae NOT DETECTED NOT DETECTED Final   Streptococcus pneumoniae NOT DETECTED NOT DETECTED Final   Streptococcus pyogenes NOT DETECTED NOT DETECTED Final   Acinetobacter baumannii NOT DETECTED NOT DETECTED Final   Enterobacteriaceae species DETECTED (A) NOT DETECTED Final    Comment: Enterobacteriaceae represent a large family of gram-negative bacteria, not a single organism. CRITICAL RESULT CALLED TO, READ BACK BY AND VERIFIED WITH: N. GLOGOVAC, RPHARM (WL) AT 1000 ON 05/25/17 BY C. JESSUP, MLT.    Enterobacter cloacae  complex NOT DETECTED NOT DETECTED Final   Escherichia coli DETECTED (A) NOT DETECTED Final    Comment: CRITICAL RESULT CALLED TO, READ BACK BY AND VERIFIED WITH: N. GLOGOVAC, RPHARMD (WL) AT 1000 ON 05/25/17 BY C. JESSUP, MLT.    Klebsiella oxytoca NOT DETECTED NOT DETECTED Final   Klebsiella pneumoniae NOT DETECTED NOT DETECTED Final   Proteus species NOT DETECTED NOT DETECTED Final   Serratia marcescens NOT DETECTED NOT DETECTED Final   Carbapenem resistance NOT DETECTED NOT DETECTED Final   Haemophilus influenzae NOT DETECTED NOT DETECTED Final   Neisseria meningitidis NOT DETECTED NOT DETECTED Final   Pseudomonas aeruginosa NOT DETECTED NOT DETECTED Final   Candida albicans NOT DETECTED NOT DETECTED Final   Candida glabrata NOT DETECTED NOT DETECTED Final  Candida krusei NOT DETECTED NOT DETECTED Final   Candida parapsilosis NOT DETECTED NOT DETECTED Final   Candida tropicalis NOT DETECTED NOT DETECTED Final    Comment: Performed at [facility], [address] [place], [place] [address]  Culture, blood (routine x 2)     Status: Abnormal   Collection Time: 05/24/17  7:31 PM  Result Value Ref Range Status   Specimen Description   Final    BLOOD LEFT HAND Performed at [facility], [address] [place], [place] [address]    Special Requests   Final    IN PEDIATRIC BOTTLE Blood Culture adequate volume Performed at [facility], [address] [place], [place] [address]    Culture  Setup Time GRAM POSITIVE COCCI IN PEDIATRIC BOTTLE   Final   Culture (A)  Final    ENTEROCOCCUS FAECALIS SUSCEPTIBILITIES PERFORMED ON PREVIOUS CULTURE WITHIN THE LAST 5 DAYS. Performed at [facility], [address] [place], [place] [address]    Report Status 05/27/2017 FINAL  Final  Culture, Urine     Status: None   Collection Time: 05/25/17  6:40 AM  Result Value Ref Range Status   Specimen Description   Final    URINE, CLEAN  CATCH Performed at [facility], [address] [place], [place] [address]    Special Requests   Final    NONE Performed at [facility], [address] [place], [place] [address]    Culture   Final    NO GROWTH Performed at [facility], [address] [place], [place] [address]    Report Status 05/26/2017 FINAL  Final  Culture, blood (routine x 2)     Status: None (Preliminary result)   Collection Time: 05/26/17  5:27 AM  Result Value Ref Range Status   Specimen Description   Final    BLOOD RIGHT ANTECUBITAL Performed at [facility], [address] [place], [place] [address]    Special Requests   Final    BOTTLES DRAWN AEROBIC AND ANAEROBIC Blood Culture adequate volume Performed at [facility], [address] [place], [place] [address]    Culture   Final    NO GROWTH 1 DAY Performed at [facility], [address] [place], [place] [address]    Report Status PENDING  Incomplete  Culture, blood (routine x 2)     Status: None (Preliminary result)   Collection Time: 05/26/17  5:34 AM  Result Value Ref Range Status   Specimen Description   Final    BLOOD RIGHT HAND Performed at [facility], [address] [place], [place] [address]    Special Requests   Final    BOTTLES DRAWN AEROBIC AND ANAEROBIC Blood Culture adequate volume Performed at [facility], [address] [place], [place] [address]    Culture   Final    NO GROWTH 1 DAY Performed at [facility], [address] [place], [place] [address]    Report Status PENDING  Incomplete     Studies: No results found.  Scheduled Meds: . aspirin EC  81 mg Oral QPM  . latanoprost  1 drop Both Eyes QHS  . levothyroxine  88 mcg Oral Daily  . pantoprazole  40 mg Oral Daily  . rivaroxaban  20 mg Oral Q supper  . rosuvastatin  10 mg Oral Once per day  on Mon Wed Fri  . timolol  1  drop Both Eyes q morning - 10a    Continuous Infusions: . piperacillin-tazobactam (ZOSYN)  IV 3.375 g (05/27/17 1500)     Time spent: , case discussed with infectious disease I have personally reviewed and interpreted on  05/27/2017 daily labs, tele strips, imagings as discussed above under date review session and assessment and plans.  I reviewed all nursing notes, pharmacy notes, consultant notes,  vitals, pertinent old records  I have discussed plan of care as described above with RN , patient and family on 05/27/2017   Albertine Grates MD, PhD  [facility] Pager [phone]. If 7PM-7AM, please contact night-coverage at [email or web address], password [facility] 05/27/2017, 7:30 PM  LOS: 2 days

## 2017-05-27 NOTE — Progress Notes (Signed)
  Echocardiogram 2D Echocardiogram has been performed.  Leta JunglingCooper, Jream Broyles M 05/27/2017, 10:25 AM

## 2017-05-28 ENCOUNTER — Inpatient Hospital Stay (HOSPITAL_COMMUNITY): Payer: Medicare Other

## 2017-05-28 ENCOUNTER — Inpatient Hospital Stay: Payer: Self-pay

## 2017-05-28 DIAGNOSIS — Z95828 Presence of other vascular implants and grafts: Secondary | ICD-10-CM

## 2017-05-28 LAB — BASIC METABOLIC PANEL
ANION GAP: 9 (ref 5–15)
BUN: 7 mg/dL (ref 6–20)
CHLORIDE: 104 mmol/L (ref 101–111)
CO2: 25 mmol/L (ref 22–32)
Calcium: 8.7 mg/dL — ABNORMAL LOW (ref 8.9–10.3)
Creatinine, Ser: 0.53 mg/dL (ref 0.44–1.00)
Glucose, Bld: 118 mg/dL — ABNORMAL HIGH (ref 65–99)
POTASSIUM: 3.5 mmol/L (ref 3.5–5.1)
SODIUM: 138 mmol/L (ref 135–145)

## 2017-05-28 LAB — CBC
HEMATOCRIT: 36.9 % (ref 36.0–46.0)
HEMOGLOBIN: 12.1 g/dL (ref 12.0–15.0)
MCH: 30.6 pg (ref 26.0–34.0)
MCHC: 32.8 g/dL (ref 30.0–36.0)
MCV: 93.2 fL (ref 78.0–100.0)
Platelets: 133 10*3/uL — ABNORMAL LOW (ref 150–400)
RBC: 3.96 MIL/uL (ref 3.87–5.11)
RDW: 13.8 % (ref 11.5–15.5)
WBC: 6.8 10*3/uL (ref 4.0–10.5)

## 2017-05-28 MED ORDER — AMPICILLIN IV (FOR PTA / DISCHARGE USE ONLY): 2.0000 g | Freq: Four times a day (QID) | INTRAVENOUS | 0 refills | Status: AC

## 2017-05-28 MED ORDER — FUROSEMIDE 10 MG/ML IJ SOLN
40.0000 mg | Freq: Once | INTRAMUSCULAR | Status: AC
  Administered 2017-05-28: 40 mg via INTRAVENOUS
  Filled 2017-05-28: qty 4

## 2017-05-28 MED ORDER — SODIUM CHLORIDE 0.9% FLUSH: 10.0000 mL | INTRAVENOUS | Status: DC | PRN

## 2017-05-28 MED ORDER — SODIUM CHLORIDE 0.9 % IV SOLN
2.0000 g | Freq: Four times a day (QID) | INTRAVENOUS | Status: DC
  Administered 2017-05-28 (×2): 2 g via INTRAVENOUS
  Filled 2017-05-28 (×2): qty 2
  Filled 2017-05-28 (×2): qty 2000

## 2017-05-28 MED ORDER — AMPICILLIN 500 MG PO CAPS: 500.0000 mg | ORAL_CAPSULE | Freq: Three times a day (TID) | ORAL | 0 refills | Status: DC

## 2017-05-28 MED ORDER — SACCHAROMYCES BOULARDII 250 MG PO CAPS
250.0000 mg | ORAL_CAPSULE | Freq: Two times a day (BID) | ORAL | Status: DC
  Administered 2017-05-28: 250 mg via ORAL
  Filled 2017-05-28: qty 1

## 2017-05-28 MED ORDER — AMPICILLIN 500 MG PO CAPS: 500.0000 mg | ORAL_CAPSULE | Freq: Three times a day (TID) | ORAL | Status: DC

## 2017-05-28 MED ORDER — SACCHAROMYCES BOULARDII 250 MG PO CAPS: 250.0000 mg | ORAL_CAPSULE | Freq: Two times a day (BID) | ORAL | 0 refills | Status: DC

## 2017-05-28 MED ORDER — FUROSEMIDE 40 MG PO TABS: 40.0000 mg | ORAL_TABLET | Freq: Once | ORAL | Status: DC

## 2017-05-28 NOTE — Progress Notes (Signed)
INFECTIOUS DISEASE PROGRESS NOTE  ID: Beth Santiago is a 84 y.o. female with  Principal Problem:   SIRS (systemic inflammatory response syndrome) (HCC) Active Problems:   HYPERTENSION, BENIGN   CAD, NATIVE VESSEL   PACEMAKER-[employer]   Atrial fibrillation (HCC)   Chronic diastolic CHF (congestive heart failure) (HCC)  Subjective: No complaints  Abtx:  Anti-infectives (From admission, onward)   Start     Dose/Rate Route Frequency Ordered Stop   06/09/17 0600  ampicillin (PRINCIPEN) capsule 500 mg     500 mg Oral Every 8 hours 05/28/17 1210     05/28/17 1300  ampicillin (OMNIPEN) 2 g in sodium chloride 0.9 % 100 mL IVPB     2 g 300 mL/hr over 20 Minutes Intravenous Every 6 hours 05/28/17 1209     05/25/17 1400  vancomycin (VANCOCIN) IVPB 750 mg/150 ml premix  Status:  Discontinued     750 mg 150 mL/hr over 60 Minutes Intravenous Every 24 hours 05/25/17 0212 05/25/17 1728   05/25/17 0300  piperacillin-tazobactam (ZOSYN) IVPB 3.375 g  Status:  Discontinued     3.375 g 12.5 mL/hr over 240 Minutes Intravenous Every 8 hours 05/25/17 0212 05/28/17 1209   05/24/17 2300  vancomycin (VANCOCIN) IVPB 1000 mg/200 mL premix     1,000 mg 200 mL/hr over 60 Minutes Intravenous  Once 05/24/17 2255 05/25/17 0000   05/24/17 1845  piperacillin-tazobactam (ZOSYN) IVPB 3.375 g     3.375 g 100 mL/hr over 30 Minutes Intravenous  Once 05/24/17 1834 05/24/17 2008      Medications:  Scheduled: . [START ON 06/09/2017] ampicillin  500 mg Oral Q8H  . aspirin EC  81 mg Oral QPM  . latanoprost  1 drop Both Eyes QHS  . levothyroxine  88 mcg Oral Daily  . nebivolol  2.5 mg Oral Daily  . pantoprazole  40 mg Oral Daily  . rivaroxaban  20 mg Oral Q supper  . rosuvastatin  10 mg Oral Once per day on Mon Wed Fri  . saccharomyces boulardii  250 mg Oral BID  . timolol  1 drop Both Eyes q morning - 10a    Objective: Vital signs in last 24 hours: Temp:  [97.9 F (36.6 C)-98.2 F (36.8 C)] 97.9 F  (36.6 C) (03/08 0427) Pulse Rate:  [60-63] 60 (03/07 2103) Resp:  [18-20] 18 (03/08 0427) BP: (118-143)/(66-86) 126/70 (03/08 1000) SpO2:  [97 %-98 %] 98 % (03/08 0427) Weight:  [79 kg (174 lb 2.6 oz)] 79 kg (174 lb 2.6 oz) (03/08 0428)   General appearance: alert, cooperative and no distress Resp: clear to auscultation bilaterally Cardio: regular rate and rhythm GI: normal findings: bowel sounds normal and soft, non-tender  Lab Results Recent Labs    05/27/17 0525 05/28/17 0525  WBC 6.6 6.8  HGB 11.8* 12.1  HCT 35.4* 36.9  NA 134* 138  K 3.2* 3.5  CL 101 104  CO2 25 25  BUN 10 7  CREATININE 0.61 0.53   Liver Panel No results for input(s): PROT, ALBUMIN, AST, ALT, ALKPHOS, BILITOT, BILIDIR, IBILI in the last 72 hours. Sedimentation Rate No results for input(s): ESRSEDRATE in the last 72 hours. C-Reactive Protein No results for input(s): CRP in the last 72 hours.  Microbiology: Recent Results (from the past 240 hour(s))  Culture, blood (routine x 2)     Status: Abnormal   Collection Time: 05/24/17  4:54 PM  Result Value Ref Range Status   Specimen Description   Final  BLOOD BLOOD LEFT HAND Performed at [place] [address] [place], [place] [address]    Special Requests   Final    IN PEDIATRIC BOTTLE Blood Culture adequate volume Performed at [place] [address], [place], [place] [address]    Culture  Setup Time   Final    GRAM POSITIVE COCCI GRAM NEGATIVE RODS IN PEDIATRIC BOTTLE CRITICAL RESULT CALLED TO, READ BACK BY AND VERIFIED WITH: N. [place], RPHARMD (WL) AT 1000 ON 05/25/17 BY C. JESSUP, MLT.  Performed at [facility], [place] [address] [place], [place] [address]    Culture (A)  Final    ENTEROCOCCUS FAECALIS GRAM NEGATIVE RODS NONVIABLE FOR WORKUP    Report Status 05/27/2017 FINAL  Final   Organism ID, Bacteria ENTEROCOCCUS FAECALIS  Final      Susceptibility   Enterococcus faecalis -  MIC*    AMPICILLIN <=2 SENSITIVE Sensitive     VANCOMYCIN 1 SENSITIVE Sensitive     GENTAMICIN SYNERGY SENSITIVE Sensitive     * ENTEROCOCCUS FAECALIS  Blood Culture ID Panel (Reflexed)     Status: Abnormal   Collection Time: 05/24/17  4:54 PM  Result Value Ref Range Status   Enterococcus species DETECTED (A) NOT DETECTED Final    Comment: CRITICAL RESULT CALLED TO, READ BACK BY AND VERIFIED WITH: N. GLOGOVAC, RPHARMD(WL) AT 1000 ON 3/5/119 BY C. JESSUP, MLT.    Vancomycin resistance NOT DETECTED NOT DETECTED Final   Listeria monocytogenes NOT DETECTED NOT DETECTED Final   Staphylococcus species NOT DETECTED NOT DETECTED Final   Staphylococcus aureus NOT DETECTED NOT DETECTED Final   Streptococcus species NOT DETECTED NOT DETECTED Final   Streptococcus agalactiae NOT DETECTED NOT DETECTED Final   Streptococcus pneumoniae NOT DETECTED NOT DETECTED Final   Streptococcus pyogenes NOT DETECTED NOT DETECTED Final   Acinetobacter baumannii NOT DETECTED NOT DETECTED Final   Enterobacteriaceae species DETECTED (A) NOT DETECTED Final    Comment: Enterobacteriaceae represent a large family of gram-negative bacteria, not a single organism. CRITICAL RESULT CALLED TO, READ BACK BY AND VERIFIED WITH: N. GLOGOVAC, RPHARM (WL) AT 1000 ON 05/25/17 BY C. JESSUP, MLT.    Enterobacter cloacae complex NOT DETECTED NOT DETECTED Final   Escherichia coli DETECTED (A) NOT DETECTED Final    Comment: CRITICAL RESULT CALLED TO, READ BACK BY AND VERIFIED WITH: N. GLOGOVAC, RPHARMD (WL) AT 1000 ON 05/25/17 BY C. JESSUP, MLT.    Klebsiella oxytoca NOT DETECTED NOT DETECTED Final   Klebsiella pneumoniae NOT DETECTED NOT DETECTED Final   Proteus species NOT DETECTED NOT DETECTED Final   Serratia marcescens NOT DETECTED NOT DETECTED Final   Carbapenem resistance NOT DETECTED NOT DETECTED Final   Haemophilus influenzae NOT DETECTED NOT DETECTED Final   Neisseria meningitidis NOT DETECTED NOT DETECTED Final    Pseudomonas aeruginosa NOT DETECTED NOT DETECTED Final   Candida albicans NOT DETECTED NOT DETECTED Final   Candida glabrata NOT DETECTED NOT DETECTED Final   Candida krusei NOT DETECTED NOT DETECTED Final   Candida parapsilosis NOT DETECTED NOT DETECTED Final   Candida tropicalis NOT DETECTED NOT DETECTED Final    Comment: Performed at [facility], [place] [address] [place], [place] [address]  Culture, blood (routine x 2)     Status: Abnormal   Collection Time: 05/24/17  7:31 PM  Result Value Ref Range Status   Specimen Description   Final    BLOOD LEFT HAND Performed at [facility], [place] [address] [place], [place] [address]    Special Requests   Final    IN PEDIATRIC BOTTLE Blood Culture adequate volume Performed at [place] [address], [place], [place] [address]    Culture  Setup Time GRAM POSITIVE COCCI IN PEDIATRIC BOTTLE   Final   Culture (A)  Final    ENTEROCOCCUS FAECALIS SUSCEPTIBILITIES PERFORMED ON PREVIOUS CULTURE WITHIN THE LAST 5 DAYS. Performed at [facility], [place] [address] [place], [place] [address]    Report Status 05/27/2017 FINAL  Final  Culture, Urine     Status: None   Collection Time: 05/25/17  6:40 AM  Result Value Ref Range Status   Specimen Description   Final    URINE, CLEAN CATCH Performed at [facility], [place] [address], [place], [place] [address]    Special Requests   Final    NONE Performed at [facility], [place] [address], [place], [place] [address]    Culture   Final    NO GROWTH Performed at [facility], [employer] [address], [place], [place] [address]    Report Status 05/26/2017 FINAL  Final  Culture, blood (routine x 2)     Status: None (Preliminary result)   Collection Time: 05/26/17  5:27 AM  Result Value Ref Range Status   Specimen Description   Final    BLOOD RIGHT ANTECUBITAL Performed at [place]  [address] [place], [place] [address]    Special Requests   Final    BOTTLES DRAWN AEROBIC AND ANAEROBIC Blood Culture adequate volume Performed at [place] [address], [place], [place] [address]    Culture   Final    NO GROWTH 1 DAY Performed at [facility], [place] [address], [place], [place] [address]    Report Status PENDING  Incomplete  Culture, blood (routine x 2)     Status: None (Preliminary result)   Collection Time: 05/26/17  5:34 AM  Result Value Ref Range Status   Specimen Description   Final    BLOOD RIGHT HAND Performed at [place] [address] [place], [place] [address]    Special Requests   Final    BOTTLES DRAWN AEROBIC AND ANAEROBIC Blood Culture adequate volume Performed at [place] [address], [place], [place] [address]    Culture   Final    NO GROWTH 1 DAY Performed at [facility], [place] [address] [place], [place] [address]    Report Status PENDING  Incomplete    Studies/Results: No results found.   Assessment/Plan: Sepsis Enterococcal bacteremia GNR bacteremia Pacemaker Recent dental procedure (cleaning, minimal bleeding per pt 05-21-17)  Total days of antibiotics: 4 zosyn --> ampicillin  D/i hospitalist Her BCx did not grow GNR Would change her to amp for 2 weeks followed by 2 weeks of amoxil (given presence of hardware).  Please let us know if further questions Explained to family.   I spent a total of 20 minutes with this patient. Greater than 50% of this time was spent counseling and coordinating care for this patient.    No Known Allergies  OPAT Orders Discharge antibiotics: ampicillin 2g ivpb q6 h Duration: 14 days End Date: 06-07-17  [facility] Care Per Protocol: please  Labs weekly while on IV antibiotics: _x_ CBC with differential __ BMP _x_ CMP __ CRP __ ESR __ Vancomycin trough  _x_ Please pull PIC at completion of IV antibiotics __  Please leave PIC in place until doctor has seen patient or been notified  Fax weekly labs to [phone]  Clinic Follow Up Appt: PCP           Bobby Rumpf MD, FACP Infectious Diseases (pager) [phone] [email or web address] 05/28/2017, 1:53 PM  LOS: 3 days

## 2017-05-28 NOTE — Progress Notes (Addendum)
PHARMACY CONSULT NOTE FOR:  OUTPATIENT  PARENTERAL ANTIBIOTIC THERAPY (OPAT)  Indication: Enterococcus bacteremia Regimen: Ampicillin 2gm IV q6h End date: 06/09/17  IV antibiotic discharge orders are pended. To discharging provider:  please sign these orders via discharge navigator,  Select New Orders & click on the button choice - Manage This Unsigned Work.     Thank you for allowing pharmacy to be a part of this patient's care.  Elson ClanLilliston, Edric Fetterman Michelle 05/28/2017, 12:02 PM

## 2017-05-28 NOTE — Progress Notes (Signed)
Patient discharged home with daughter, discharge instructions given and explained to patient/daughther and they verbalized understanding, patient denies any pain/distress. Skin intact, no wound noted. Discharged with PICC to continue home antibiotic, home health to F/U at home with patient.  Accompanied home by daughter.

## 2017-05-28 NOTE — Discharge Summary (Signed)
Discharge Summary  Beth Santiago GYI:[id number] DOB: July 08, 1924  PCP: Reynold Bowen, MD  Admit date: 05/24/2017 Discharge date: 05/28/2017  Time spent: >54mns  Recommendations for Outpatient Follow-up:  1. F/u with PMD within a week  for hospital discharge follow up, repeat cbc/bmp at follow up 2. F/u with cardiology 3. Home health arranged  Discharge Diagnoses:  Active Hospital Problems   Diagnosis Date Noted  . SIRS (systemic inflammatory response syndrome) ([place] 05/24/2017  . Chronic diastolic CHF (congestive heart failure) ([place] 05/25/2017  . Atrial fibrillation ([place] 06/11/2011  . PACEMAKER-Medtronic 12/11/2009  . HYPERTENSION, BENIGN 12/11/2009  . CAD, NATIVE VESSEL 12/11/2009    Resolved Hospital Problems  No resolved problems to display.    Discharge Condition: stable  Diet recommendation: heart healthy  Filed Weights   05/27/17 0421 05/27/17 0810 05/28/17 0428  Weight: 73.7 kg (162 lb 7.7 oz) 73.7 kg (162 lb 7.7 oz) 79 kg (174 lb 2.6 oz)    History of present illness: (per admitting MD Dr KHal Hope PCP: SReynold Bowen MD  Patient coming from:.  Home.  Chief Complaint: Fever chills and weakness.  HPI: Beth Belardois a 84y.o. female with history of atrial fibrillation, diastolic CHF, hypothyroidism, complete heart block status post pacemaker placement, hypertension was brought to the ER after patient has been feeling weak for last 24 hours.  Patient symptoms started 2 days ago after visiting discharge.  Patient started having fever chills and felt weak and had to lie on the bed.  Since then patient is feeling weak to stand up.  Has been having some cough denies any chest pain or shortness of breath nausea vomiting abdominal pain.  Since patient's weakness and fever chills persistent patient came to the ER as per the daughter patient recorded a fever of unknown 100 F at home.  ED Course: The ER patient was found to be hypotensive with blood  pressure in the 80s.  Lactate was high around 3.  Blood work also showed leukocytosis of 14,000.  Chest x-ray UA and flu panel were negative.  Blood cultures were obtained and started on fluid bolus and admitted for SIRS source not clear.  Patient was admitted last year in April for sepsis from pneumonia.    Hospital Course:  Principal Problem:   SIRS (systemic inflammatory response syndrome) (HCC) Active Problems:   HYPERTENSION, BENIGN   CAD, NATIVE VESSEL   PACEMAKER-Medtronic   Atrial fibrillation (HCC)   Chronic diastolic CHF (congestive heart failure) (HCC)  Bacteremia from Enterococcus and ?GNR /sepsis presented on admission with leukocytosis lactic acidosis hypotension bcid + enterococcus and ecoli, but blood culture only grow enterococcus, repeat blood culture no growth Unclear source, Patient reports recent dental procedure She is treated with Zosyn in the hospital, Infectious disease consulted who recommend TEE. TEE negative for vagetation. Patient is to discharged on iv ampicillin for two weeks then oral abx for two weeks. picc line placed on 3/8, home health arranged.  Possible acute on chronic diastolic CHF She reports some short of breath  DC IV fluids, IV Lasix 418mx1 on 3/7 and iv lasix 4022m1 on 3/8. Resume home lasix at discharge, no edema at discharge.   Hypokalemia,  K replaced  Chronic A. fib on Xarelto, History of complete heart block status post pacemaker Rate controlled  Home meds Bystolic held due to hypotension, resume Bystolic 2.5 mg on 3/8    Hypertension presented with hypotension and sepsis Home Meds Norvasc held since admission BP improving ,resume Bystolic,  lasix Resume norvasc in 1-2 days.    Code Status: full  Family Communication: patient and daughter  Disposition Plan: home with home health   Consultants:  Infectious disease  Cardiology  Procedures:  TEE on 3/7  picc line placement on  3/8  Antibiotics:  As above   Discharge Exam: BP 126/70   Pulse 60   Temp 97.9 F (36.6 C) (Oral)   Resp 18   Ht _0  (1.676 m)   Wt 79 kg (174 lb 2.6 oz)   SpO2 98%   BMI 28.11 kg/m   General: NAD, aaox3 Cardiovascular: paced rhythm Respiratory: CTABL  Discharge Instructions You were cared for by a hospitalist during your hospital stay. If you have any questions about your discharge medications or the care you received while you were in the hospital after you are discharged, you can call the unit and asked to speak with the hospitalist on call if the hospitalist that took care of you is not available. Once you are discharged, your primary care physician will handle any further medical issues. Please note that NO REFILLS for any discharge medications will be authorized once you are discharged, as it is imperative that you return to your primary care physician (or establish a relationship with a primary care physician if you do not have one) for your aftercare needs so that they can reassess your need for medications and monitor your lab values.  Discharge Instructions    Diet - low sodium heart healthy   Complete by:  As directed    Home infusion instructions [facility] May follow [place] Dosing Protocol; May administer Cathflo as needed to maintain patency of vascular access device.; Flushing of vascular access device: per [facility] Protocol: 0.9% NaCl pre/post medica...   Complete by:  As directed    Instructions:  May follow [place] Dosing Protocol   Instructions:  May administer Cathflo as needed to maintain patency of vascular access device.   Instructions:  Flushing of vascular access device: per [facility] Protocol: 0.9% NaCl pre/post medication administration and prn patency; Heparin 100 u/ml, 24m for implanted ports and Heparin 10u/ml, 569mfor all other central venous catheters.   Instructions:  May follow [facility] Anaphylaxis Protocol for First Dose Administration in  the home: 0.9% NaCl at 25-50 ml/hr to maintain IV access for protocol meds. Epinephrine 0.3 ml IV/IM PRN and Benadryl 25-50 IV/IM PRN s/s of anaphylaxis.   Instructions:  [facility] Coordinator (RN) to assist per patient IV care needs in the home PRN.   Increase activity slowly   Complete by:  As directed      Allergies as of 05/28/2017   No Known Allergies     Medication List    STOP taking these medications   acetaminophen 325 MG tablet Commonly known as:  TYLENOL     TAKE these medications   amLODipine 5 MG tablet Commonly known as:  NORVASC Take 1 tablet (5 mg total) by mouth daily.   ampicillin 500 MG capsule Commonly known as:  PRINCIPEN Take 1 capsule (500 mg total) by mouth every 8 (eight) hours. Start taking on:  06/09/2017   ampicillin IVPB Inject 2 g into the vein every 6 (six) hours for 12 days. Indication:  Enterococcus bacteremia Last Day of Therapy: 06/09/17 Labs - Once weekly:  CBC/D and BMP, Labs - Every other week:  ESR and CRP   aspirin EC 81 MG tablet Take 81 mg by mouth every evening.  BYSTOLIC 5 MG tablet Generic drug:  nebivolol TAKE 1/2 TABLET BY MOUTH DAILY What changed:    how much to take  how to take this  when to take this   calcium carbonate 500 MG chewable tablet Commonly known as:  TUMS - dosed in mg elemental calcium Chew 1 tablet (200 mg of elemental calcium total) by mouth as needed for indigestion or heartburn.   CENTRUM SILVER PO Take 1 tablet by mouth daily.   co-enzyme Q-10 30 MG capsule Take 30 mg by mouth daily.   fish oil-omega-3 fatty acids 1000 MG capsule Take 2 g daily by mouth.   furosemide 40 MG tablet Commonly known as:  LASIX Take 1 tablet (40 mg total) by mouth daily. May take an extra tablet for wt gain of 3 lbs overnight or 5lbs in a week. What changed:  Another medication with the same name was removed. Continue taking this medication, and follow the directions you see here.    latanoprost 0.005 % ophthalmic solution Commonly known as:  XALATAN Place 1 drop into both eyes at bedtime.   levothyroxine 88 MCG tablet Commonly known as:  SYNTHROID, LEVOTHROID Take 88 mcg by mouth daily.   omeprazole 20 MG capsule Commonly known as:  PRILOSEC Take 20 mg by mouth 2 (two) times daily before a meal. Take once or twice a day   OSTEO BI-FLEX JOINT SHIELD PO Take 1 tablet by mouth daily.   TART CHERRY ADVANCED Caps Take 1 capsule by mouth daily.   potassium chloride SA 20 MEQ tablet Commonly known as:  K-DUR,KLOR-CON Take 1 tablet (20 mEq total) by mouth daily. Take 1 extra tab when you take extra Lasix   rosuvastatin 20 MG tablet Commonly known as:  CRESTOR Take 10 mg by mouth 3 (three) times a week. Tue, Thur and Sat   saccharomyces boulardii 250 MG capsule Commonly known as:  FLORASTOR Take 1 capsule (250 mg total) by mouth 2 (two) times daily.   timolol 0.5 % ophthalmic solution Commonly known as:  BETIMOL Place 1 drop into both eyes every morning.   XARELTO 20 MG Tabs tablet Generic drug:  rivaroxaban TAKE 1 TABLET BY MOUTH DAILY WITH SUPPER            Home Infusion Instuctions  (From admission, onward)        Start     Ordered   05/28/17 0000  Home infusion instructions [facility] May follow [place] Dosing Protocol; May administer Cathflo as needed to maintain patency of vascular access device.; Flushing of vascular access device: per [facility] Protocol: 0.9% NaCl pre/post medica...    Question Answer Comment  Instructions May follow [place] Dosing Protocol   Instructions May administer Cathflo as needed to maintain patency of vascular access device.   Instructions Flushing of vascular access device: per [facility] Protocol: 0.9% NaCl pre/post medication administration and prn patency; Heparin 100 u/ml, 49m for implanted ports and Heparin 10u/ml, 590mfor all other central venous catheters.   Instructions May follow [facility] Anaphylaxis  Protocol for First Dose Administration in the home: 0.9% NaCl at 25-50 ml/hr to maintain IV access for protocol meds. Epinephrine 0.3 ml IV/IM PRN and Benadryl 25-50 IV/IM PRN s/s of anaphylaxis.   Instructions [facility] Infusion Coordinator (RN) to assist per patient IV care needs in the home PRN.      05/28/17 1436     No Known Allergies Follow-up Information    [facility] Follow  up.   Specialty:  [place] Why:  [place] nursing-SOC 05/29/17 in am/physical therapy start of care date Monday 05/31/17. Contact information: [address] [place] [address] [phone]        [facility], [facility]-Home Follow up.   Specialty:  [place] Why:  [facility] nursing-iv infusion-supplies Contact information: [place] [address] [phone]        Reynold Bowen, MD Follow up in 1 week(s).   Specialty:  Endocrinology Why:  hospital discharge follow up. repeat cbc/bmp. Contact information: [place] [address] [phone]        Bensimhon, Shaune Pascal, MD Follow up.   Specialty:  Cardiology Contact information: [place] [address] [phone]        [facility] Follow up.   Specialty:  Radiology Contact information: [place] [id number] [place] [phone]           The results of significant diagnostics from this hospitalization (including imaging, microbiology, ancillary and laboratory) are listed below for reference.    Significant Diagnostic Studies: Dg Chest 2 View  Result Date: 05/25/2017 CLINICAL DATA:  Expiratory wheezing on the left. EXAM: CHEST  2 VIEW COMPARISON:  Earlier same day.  05/24/2017. FINDINGS: Cardiomegaly. Aortic atherosclerosis. Dual lead pacemaker. Interstitial lung markings which are worsened and presumably reflect early interstitial edema.  Small amount of fluid in the fissures and in the posterior costophrenic angle. No consolidation or collapse. IMPRESSION: Development of abnormal interstitial pattern consistent with interstitial edema. No consolidation or collapse. Electronically Signed   By: Nelson Chimes M.D.   On: 05/25/2017 11:03   US Renal  Result Date: 05/25/2017 CLINICAL DATA:  Urinary tract infection, history hypertension EXAM: RENAL / URINARY TRACT ULTRASOUND COMPLETE COMPARISON:  None FINDINGS: Right Kidney: Length: 11.8 cm. Normal cortical thickness and echogenicity for age. Small peripelvic renal cyst 2.1 x 1.3 x 1.6 cm, mildly complicated by scattered internal echoes. No additional mass, hydronephrosis or shadowing calcification. Left Kidney: Length: 11.3 cm. Normal cortical thickness and echogenicity. No mass, hydronephrosis or shadowing calcification. Bladder: Normal appearance IMPRESSION: Mildly complicated RIGHT renal cyst 2.1 cm in greatest diameter. Otherwise negative exam. Electronically Signed   By: Lavonia Dana M.D.   On: 05/25/2017 12:50   Dg Chest Port 1 View  Result Date: 05/28/2017 CLINICAL DATA:  Line placement EXAM: PORTABLE CHEST 1 VIEW COMPARISON:  05/25/2017, 05/24/2017 FINDINGS: Right upper extremity catheter tip difficult to visualize, appears to project over the caval atrial region. Left-sided pacing device as before. Cardiomegaly with aortic atherosclerosis. Possible small left pleural effusion. Increased airspace disease at the left lung base. Mild diffuse interstitial opacity suspicious for mild degree of edema. IMPRESSION: 1. Difficult visualization of right upper extremity catheter, tip appears to project over the cavoatrial region 2. Cardiomegaly with suspected small pleural effusion. Increased airspace disease at the left base. Vascular congestion with mild diffuse increased interstitial opacity, suspect for mild pulmonary edema. Electronically Signed   By: Donavan Foil M.D.   On: 05/28/2017 18:42   Dg  Chest Port 1 View  Result Date: 05/25/2017 CLINICAL DATA:  Sepsis EXAM: PORTABLE CHEST 1 VIEW COMPARISON:  05/24/2017, 06/21/2016 FINDINGS: Left-sided pacing device. Cardiomegaly with mild central congestion. Aortic atherosclerosis. Mild increased opacity at the left lung base. No pneumothorax. IMPRESSION: Mild left lung base opacity, could reflect atelectasis or infiltrate. Stable cardiomegaly. Electronically Signed   By: Maudie Mercury  Francoise Ceo M.D.   On: 05/25/2017 02:16   Dg Chest Port 1 View  Result Date: 05/24/2017 CLINICAL DATA:  Short of breath last night with fever today. EXAM: PORTABLE CHEST 1 VIEW COMPARISON:  06/21/2016 FINDINGS: Cardiac silhouette is mildly enlarged. No mediastinal or hilar masses. No evidence of adenopathy. There are fine reticular type opacities most evident at the peripheral right upper lobe. Lungs otherwise clear, with no evidence of pneumonia or pulmonary edema. No pleural effusion or pneumothorax. Left anterior chest wall pacemaker is stable. Skeletal structures are demineralized but grossly intact. IMPRESSION: No acute cardiopulmonary disease. Electronically Signed   By: Lajean Manes M.D.   On: 05/24/2017 17:17   [place] Ekg Site Rite  Result Date: 05/28/2017 If Site Rite image not attached, placement could not be confirmed due to current cardiac rhythm.   Microbiology: Recent Results (from the past 240 hour(s))  Culture, blood (routine x 2)     Status: Abnormal   Collection Time: 05/24/17  4:54 PM  Result Value Ref Range Status   Specimen Description   Final    BLOOD BLOOD LEFT HAND Performed at [facility], [place] [address] [place], [place] [address]    Special Requests   Final    IN PEDIATRIC BOTTLE Blood Culture adequate volume Performed at [facility] [address], [place], [place] [address]    Culture  Setup Time   Final    GRAM POSITIVE COCCI GRAM NEGATIVE RODS IN PEDIATRIC BOTTLE CRITICAL RESULT CALLED TO, READ BACK  BY AND VERIFIED WITH: N. [place], RPHARMD ([place]) AT 1000 ON 05/25/17 BY C. JESSUP, MLT.  Performed at [facility], [place] [address], [place], [place] [address]    Culture (A)  Final    ENTEROCOCCUS FAECALIS GRAM NEGATIVE RODS NONVIABLE FOR WORKUP    Report Status 05/27/2017 FINAL  Final   Organism ID, Bacteria ENTEROCOCCUS FAECALIS  Final      Susceptibility   Enterococcus faecalis - MIC*    AMPICILLIN <=2 SENSITIVE Sensitive     VANCOMYCIN 1 SENSITIVE Sensitive     GENTAMICIN SYNERGY SENSITIVE Sensitive     * ENTEROCOCCUS FAECALIS  Blood Culture ID Panel (Reflexed)     Status: Abnormal   Collection Time: 05/24/17  4:54 PM  Result Value Ref Range Status   Enterococcus species DETECTED (A) NOT DETECTED Final    Comment: CRITICAL RESULT CALLED TO, READ BACK BY AND VERIFIED WITH: N. GLOGOVAC, RPHARMD([place]) AT 1000 ON 3/5/119 BY C. JESSUP, MLT.    Vancomycin resistance NOT DETECTED NOT DETECTED Final   Listeria monocytogenes NOT DETECTED NOT DETECTED Final   Staphylococcus species NOT DETECTED NOT DETECTED Final   Staphylococcus aureus NOT DETECTED NOT DETECTED Final   Streptococcus species NOT DETECTED NOT DETECTED Final   Streptococcus agalactiae NOT DETECTED NOT DETECTED Final   Streptococcus pneumoniae NOT DETECTED NOT DETECTED Final   Streptococcus pyogenes NOT DETECTED NOT DETECTED Final   Acinetobacter baumannii NOT DETECTED NOT DETECTED Final   Enterobacteriaceae species DETECTED (A) NOT DETECTED Final    Comment: Enterobacteriaceae represent a large family of gram-negative bacteria, not a single organism. CRITICAL RESULT CALLED TO, READ BACK BY AND VERIFIED WITH: N. GLOGOVAC, RPHARM (WL) AT 1000 ON 05/25/17 BY C. JESSUP, MLT.    Enterobacter cloacae complex NOT DETECTED NOT DETECTED Final   Escherichia coli DETECTED (A) NOT DETECTED Final    Comment: CRITICAL RESULT CALLED TO, READ BACK BY AND VERIFIED WITH: N. GLOGOVAC, RPHARMD ([place]) AT  1000 ON 05/25/17 BY C. JESSUP, MLT.     Klebsiella oxytoca NOT DETECTED NOT DETECTED Final   Klebsiella pneumoniae NOT DETECTED NOT DETECTED Final   Proteus species NOT DETECTED NOT DETECTED Final   Serratia marcescens NOT DETECTED NOT DETECTED Final   Carbapenem resistance NOT DETECTED NOT DETECTED Final   Haemophilus influenzae NOT DETECTED NOT DETECTED Final   Neisseria meningitidis NOT DETECTED NOT DETECTED Final   Pseudomonas aeruginosa NOT DETECTED NOT DETECTED Final   Candida albicans NOT DETECTED NOT DETECTED Final   Candida glabrata NOT DETECTED NOT DETECTED Final   Candida krusei NOT DETECTED NOT DETECTED Final   Candida parapsilosis NOT DETECTED NOT DETECTED Final   Candida tropicalis NOT DETECTED NOT DETECTED Final    Comment: Performed at [facility], [place] [address], [place], [place] [address]  Culture, blood (routine x 2)     Status: Abnormal   Collection Time: 05/24/17  7:31 PM  Result Value Ref Range Status   Specimen Description   Final    BLOOD LEFT HAND Performed at [facility] [address], [place], [place] [address]    Special Requests   Final    IN PEDIATRIC BOTTLE Blood Culture adequate volume Performed at [place] [address], [place], [place] [address]    Culture  Setup Time GRAM POSITIVE COCCI IN PEDIATRIC BOTTLE   Final   Culture (A)  Final    ENTEROCOCCUS FAECALIS SUSCEPTIBILITIES PERFORMED ON PREVIOUS CULTURE WITHIN THE LAST 5 DAYS. Performed at [facility], [place] [address], [place], [place] [address]    Report Status 05/27/2017 FINAL  Final  Culture, Urine     Status: None   Collection Time: 05/25/17  6:40 AM  Result Value Ref Range Status   Specimen Description   Final    URINE, CLEAN CATCH Performed at [facility], [place] [address], [place], [place] [address]    Special Requests   Final    NONE Performed at [facility], [place] [address] [place], [place] [address]    Culture   Final     NO GROWTH Performed at [facility], [place] [address] [place], [place] [address]    Report Status 05/26/2017 FINAL  Final  Culture, blood (routine x 2)     Status: None (Preliminary result)   Collection Time: 05/26/17  5:27 AM  Result Value Ref Range Status   Specimen Description   Final    BLOOD RIGHT ANTECUBITAL Performed at [place] [address], [place], [place] [address]    Special Requests   Final    BOTTLES DRAWN AEROBIC AND ANAEROBIC Blood Culture adequate volume Performed at [facility] [address], [place], [place] [address]    Culture   Final    NO GROWTH 2 DAYS Performed at [place] [address] [place], [place] [address]    Report Status PENDING  Incomplete  Culture, blood (routine x 2)     Status: None (Preliminary result)   Collection Time: 05/26/17  5:34 AM  Result Value Ref Range Status   Specimen Description   Final    BLOOD RIGHT HAND Performed at [place] [address], [place],  [address]    Special Requests   Final    BOTTLES DRAWN AEROBIC AND ANAEROBIC Blood Culture adequate volume Performed at [facility] [address]  [place], [place] [address]    Culture   Final    NO GROWTH 2 DAYS Performed at [facility], [place] [address] [place], [place] [address]    Report Status PENDING  Incomplete     Labs: Basic Metabolic Panel: Recent Labs  Lab 05/24/17 1839 05/25/17 0513 05/26/17 0534 05/27/17 0525 05/28/17 0525  NA 135 135 136 134* 138  K 3.9 3.2* 3.9 3.2* 3.5  CL 104 103 106 101 104  CO2 _0 GLUCOSE 169* 138* 133* 114* 118*  BUN _1 CREATININE 0.80 0.79 0.73 0.61 0.53  CALCIUM 8.1* 8.3* 8.5* 8.3* 8.7*  MG  --   --  1.9 1.7  --    Liver Function Tests: Recent Labs  Lab 05/24/17 1839 05/25/17 0513  AST 57* 39  ALT 12* 19  ALKPHOS 57 57  BILITOT 1.9* 1.4*  PROT 5.8* 5.6*  ALBUMIN  3.0* 2.9*   No results for input(s): LIPASE, AMYLASE in the last 168 hours. No results for input(s): AMMONIA in the last 168 hours. CBC: Recent Labs  Lab 05/24/17 1654 05/25/17 0513 05/26/17 0534 05/27/17 0525 05/28/17 0525  WBC 14.1* 9.3 6.7 6.6 6.8  NEUTROABS 13.0* 8.0*  --   --   --   HGB 12.9 12.0 11.6* 11.8* 12.1  HCT 37.1 36.0 35.6* 35.4* 36.9  MCV 90.7 91.6 94.2 93.2 93.2  PLT 222 120* 107* 131* 133*   Cardiac Enzymes: No results for input(s): CKTOTAL, CKMB, CKMBINDEX, TROPONINI in the last 168 hours. BNP: BNP (last 3 results) No results for input(s): BNP in the last 8760 hours.  ProBNP (last 3 results) No results for input(s): PROBNP in the last 8760 hours.  CBG: No results for input(s): GLUCAP in the last 168 hours.     Signed:  Florencia Reasons MD, PhD  [facility] 05/28/2017, 9:38 PM

## 2017-05-28 NOTE — Care Management Important Message (Signed)
Important Message  Patient Details  Name: Beth PiggCharlotte Santiago MRN: [id number] Date of Birth: 1924/09/25   Medicare Important Message Given:  Yes    Caren MacadamFuller, Jamien Casanova 05/28/2017, 11:59 AMImportant Message  Patient Details  Name: Beth PiggCharlotte Santiago MRN: [id number] Date of Birth: 1924/09/25   Medicare Important Message Given:  Yes    Caren MacadamFuller, Barret Esquivel 05/28/2017, 11:59 AM

## 2017-05-28 NOTE — Progress Notes (Signed)
Peripherally Inserted Central Catheter/Midline Placement  The IV Nurse has discussed with the patient and/or persons authorized to consent for the patient, the purpose of this procedure and the potential benefits and risks involved with this procedure.  The benefits include less needle sticks, lab draws from the catheter, and the patient may be discharged home with the catheter. Risks include, but not limited to, infection, bleeding, blood clot (thrombus formation), and puncture of an artery; nerve damage and irregular heartbeat and possibility to perform a PICC exchange if needed/ordered by physician.  Alternatives to this procedure were also discussed.  Bard Power PICC patient education guide, fact sheet on infection prevention and patient information card has been provided to patient /or left at bedside.    PICC/Midline Placement Documentation  PICC Single Lumen 05/28/17 PICC Right Cephalic 43 cm 0 cm (Active)  Indication for Insertion or Continuance of Line Prolonged intravenous therapies 05/28/2017  6:00 PM  Site Assessment Clean;Dry;Intact 05/28/2017  6:00 PM  Line Status Flushed;Blood return noted;Saline locked 05/28/2017  6:00 PM  Dressing Type Transparent 05/28/2017  6:00 PM  Dressing Status Clean;Dry;Intact;Antimicrobial disc in place 05/28/2017  6:00 PM  Dressing Intervention New dressing 05/28/2017  6:00 PM  Dressing Change Due 06/04/17 05/28/2017  6:00 PM       Audrie GallusByerly, Josh Nicolosi Ramos 05/28/2017, 6:15 PM

## 2017-05-28 NOTE — Progress Notes (Signed)
[facility] services for home IV ABX for Ms Beth Santiago in partnership with [facility] who [facility].  [facility] is prepared for DC home. [facility] contact Rosalie DoctorLisa Johnson also aware of DC and [facility] on Saturday AM.   If patient discharges after hours, please call [phone].   [name] 05/28/2017, 10:24 AM

## 2017-05-28 NOTE — Care Management Note (Signed)
Case Management Note  Patient Details  Name: Randel PiggCharlotte Mounts MRN: [id number] Date of Birth: 1924-12-09  Subjective/Objective: [facility] iv infusion rep Pam following for long term iv abx infusion. [facility] rep [name] also following for HHRN-iv abx teaching/HHPT. Await PICC.                   Action/Plan:d/c home w/HHC/iv abx.   Expected Discharge Date:                  Expected Discharge Plan:  Home w Home Health Services  In-House Referral:     Discharge planning Services  CM Consult  Post Acute Care Choice:  Durable Medical Equipment(rw) Choice offered to:  Adult Children  DME Arranged:    DME Agency:     HH Arranged:  PT, RN HH Agency:  [facility]  Status of Service:  Completed, signed off  If discussed at [employer] Length of Stay Meetings, dates discussed:    Additional Comments:  Lanier ClamMahabir, Fransisco Messmer, RN 05/28/2017, 11:43 AM

## 2017-05-31 ENCOUNTER — Telehealth: Payer: Self-pay | Admitting: *Deleted

## 2017-05-31 ENCOUNTER — Other Ambulatory Visit: Payer: Self-pay | Admitting: Pharmacist

## 2017-05-31 ENCOUNTER — Encounter (HOSPITAL_COMMUNITY): Payer: Self-pay | Admitting: Internal Medicine

## 2017-05-31 ENCOUNTER — Telehealth (HOSPITAL_COMMUNITY): Payer: Self-pay | Admitting: *Deleted

## 2017-05-31 LAB — CULTURE, BLOOD (ROUTINE X 2)
CULTURE: NO GROWTH
Culture: NO GROWTH
Special Requests: ADEQUATE
Special Requests: ADEQUATE

## 2017-05-31 NOTE — Telephone Encounter (Signed)
Pts daughter Beth Santiago called concerned about bruising around her mothers PICC line. She is receiving home IV antibiotics and [facility] her to contact our office due to bruising. Per Meredith StaggersHeather Schub RN they should contact Dr.Jeffrey Hatchers office he ordered PICC placement and is managing the IV antiboitics. Pts daughter aware and agreeable with plan.

## 2017-05-31 NOTE — Telephone Encounter (Signed)
pts home health nurse called again regarding increased bruising at PICC site, elbow, and on L arm.  reports she is currently taking heparin, xarelto, and aspirin HHRN would like to make Dr Gala RomneyBensimhon aware of bruising.  Original message was sent to [name] regarding PICC site.  Advised message has been sent to Dr.Bensimhon for review  [facility] [phone] [id number] [facility]

## 2017-05-31 NOTE — Telephone Encounter (Signed)
Patient's daughter Beth Santiago called for advice. Beth Santiago [facility]  She had a weekend-coverage Home Health nurse from [facility] visit her Saturday, started care with her primary home health nurse today.  Her primary home health nurse was concerned about the bruising around the PICC, but also on the LEFT elbow, which has bruising and swelling.  Beth Santiago does not think Beth Santiago had any fall or injury to this elbow. Per [name], [name] has no pain at her PICC or right arm, no difficult breathing, no swelling or redness (other than bruising) in her right arm.  Beth Santiago is on 20 mg Xarelto and 81 mg aspirin (stopped the aspirin on Saturday). She called [name] office first, was asked to notify [name]. Per Beth Santiago, the antibiotic infusions have slowed, now taking 1 hour to infuse rather than 30 minutes.  She also reports difficulty pushing the saline and heparin flushes.   RN sent Beth Santiago a MyChart sign up link, asked her to take pictures of the PICC and send them.  RN asked Beth Santiago to contact [name]'s office regarding [name]'s left elbow. RN relayed infusion/flushing difficulty [name] - she will contact [facility] nursing. [name], [name], RN

## 2017-06-02 NOTE — Telephone Encounter (Signed)
Your next is Friday 3/15 (but she is seeing Dr Gala RomneyBensimhon at 10:20) or 4/2.

## 2017-06-02 NOTE — Telephone Encounter (Signed)
She may need cathflo but given xarelto this seems less likely, or even high risk.  I'd be glad to see her in clinic next available thanks

## 2017-06-03 NOTE — Telephone Encounter (Signed)
Spoke with Beth Santiago, her mother's PICC arm is feeling better, bruises are fading. Her left elbow is still swollen; she has an appointment with PCP Dr Evlyn [facility]annerSouth on Wednesday.

## 2017-06-03 NOTE — Telephone Encounter (Signed)
Let's see what [facility] says thanks

## 2017-06-04 ENCOUNTER — Other Ambulatory Visit: Payer: Self-pay | Admitting: Pharmacist

## 2017-06-04 ENCOUNTER — Ambulatory Visit (HOSPITAL_COMMUNITY)
Admission: RE | Admit: 2017-06-04 | Discharge: 2017-06-04 | Disposition: A | Discharge status: Home / Self Care | Payer: Medicare Other | Source: Ambulatory Visit | Attending: Internal Medicine | Admitting: Internal Medicine

## 2017-06-04 ENCOUNTER — Encounter (HOSPITAL_COMMUNITY): Payer: Self-pay | Admitting: Internal Medicine

## 2017-06-04 VITALS — BP 124/62 | HR 62 | Wt 172.5 lb

## 2017-06-04 DIAGNOSIS — I110 Hypertensive heart disease with heart failure: Secondary | ICD-10-CM | POA: Insufficient documentation

## 2017-06-04 DIAGNOSIS — I481 Persistent atrial fibrillation: Secondary | ICD-10-CM | POA: Diagnosis not present

## 2017-06-04 DIAGNOSIS — Z7902 Long term (current) use of antithrombotics/antiplatelets: Secondary | ICD-10-CM | POA: Insufficient documentation

## 2017-06-04 DIAGNOSIS — Z8249 Family history of ischemic heart disease and other diseases of the circulatory system: Secondary | ICD-10-CM | POA: Insufficient documentation

## 2017-06-04 DIAGNOSIS — R7881 Bacteremia: Secondary | ICD-10-CM | POA: Diagnosis not present

## 2017-06-04 DIAGNOSIS — I4892 Unspecified atrial flutter: Secondary | ICD-10-CM | POA: Insufficient documentation

## 2017-06-04 DIAGNOSIS — A4181 Sepsis due to Enterococcus: Secondary | ICD-10-CM | POA: Diagnosis not present

## 2017-06-04 DIAGNOSIS — E785 Hyperlipidemia, unspecified: Secondary | ICD-10-CM | POA: Insufficient documentation

## 2017-06-04 DIAGNOSIS — Z79890 Hormone replacement therapy (postmenopausal): Secondary | ICD-10-CM | POA: Diagnosis not present

## 2017-06-04 DIAGNOSIS — Z7982 Long term (current) use of aspirin: Secondary | ICD-10-CM | POA: Insufficient documentation

## 2017-06-04 DIAGNOSIS — K219 Gastro-esophageal reflux disease without esophagitis: Secondary | ICD-10-CM | POA: Insufficient documentation

## 2017-06-04 DIAGNOSIS — I5032 Chronic diastolic (congestive) heart failure: Secondary | ICD-10-CM | POA: Insufficient documentation

## 2017-06-04 DIAGNOSIS — Z833 Family history of diabetes mellitus: Secondary | ICD-10-CM | POA: Insufficient documentation

## 2017-06-04 DIAGNOSIS — B952 Enterococcus as the cause of diseases classified elsewhere: Secondary | ICD-10-CM

## 2017-06-04 DIAGNOSIS — Z809 Family history of malignant neoplasm, unspecified: Secondary | ICD-10-CM | POA: Diagnosis not present

## 2017-06-04 DIAGNOSIS — I2510 Atherosclerotic heart disease of native coronary artery without angina pectoris: Secondary | ICD-10-CM | POA: Diagnosis not present

## 2017-06-04 DIAGNOSIS — I4821 Permanent atrial fibrillation: Secondary | ICD-10-CM

## 2017-06-04 DIAGNOSIS — Z950 Presence of cardiac pacemaker: Secondary | ICD-10-CM | POA: Diagnosis not present

## 2017-06-04 DIAGNOSIS — Z79899 Other long term (current) drug therapy: Secondary | ICD-10-CM | POA: Insufficient documentation

## 2017-06-04 DIAGNOSIS — E039 Hypothyroidism, unspecified: Secondary | ICD-10-CM | POA: Diagnosis not present

## 2017-06-04 DIAGNOSIS — I482 Chronic atrial fibrillation: Secondary | ICD-10-CM

## 2017-06-04 MED ORDER — AMOXICILLIN 500 MG PO TABS: 2000.0000 mg | ORAL_TABLET | ORAL | 6 refills | Status: DC | PRN

## 2017-06-04 NOTE — Progress Notes (Signed)
[facility] CLINIC NOTE  PCP: Dr Forde Dandy  Primary HF Cardiologist: Dr Haroldine Laws  HPI: Beth Santiago is an 84 year old with a history of , HTN, hyperlipidemia, permanent a fib, CHB PPM, nonobstructive CAD cath 2007, and chronic diastolic heart failure.   Admitted the March 2018 with R knee pain and dyspnea. Treated for severe pneumonia and heart failure. Diuresed with IV lasix and transitioned to po lasix 40 mg daily. Discharge weight was 181 pounds. Discharged to [facility].   Admitted 05/24/17 with sepsis. Bcx + enterococcus. TEE 05/27/17  Normal LVEF Severe TR. No vegetations on valves or pacer. Had dentist appointment 3 days before.  Here for post-hospital f/u. Has PICC line and plan for IV ampicillin for 2 weeks and then 2 weeks amoxicillin.  Feels well. No fevers or chills. No problems with PICC line. No edema. No SOB. Weight stable 165-167. No bleeding on Xarelto.   ECHO 06/19/2016: EF 60-65%.   ROS: All systems negative except as listed in HPI, PMH and Problem List.  SH:  Social History   Socioeconomic History  . Marital status: Widowed    Spouse name: Not on file  . Number of children: Not on file  . Years of education: Not on file  . Highest education level: Not on file  Social Needs  . Financial resource strain: Not on file  . Food insecurity - worry: Not on file  . Food insecurity - inability: Not on file  . Transportation needs - medical: Not on file  . Transportation needs - non-medical: Not on file  Occupational History  . Not on file  Tobacco Use  . Smoking status: Never Smoker  . Smokeless tobacco: Never Used  Substance and Sexual Activity  . Alcohol use: Yes    Comment: 05/2016    OCCASIONAL   . Drug use: No  . Sexual activity: Not on file  Other Topics Concern  . Not on file  Social History Narrative  . Not on file    FH:  Family History  Problem Relation Age of Onset  . Cancer Mother   . Diabetes Mother   . Hypertension Mother   . Heart  disease Father   . Cancer Brother   . Heart disease Brother     Past Medical History:  Diagnosis Date  . Atrial flutter ([place])    a. Noted during pacemaker implantation 2011, spontaneously terminated.  . Biatrial enlargement    severe  . CAD (coronary artery disease)    a. Nonobstructive by cath 2005 - 40-50% mid LAD. b. Nonischemic nuc 05/2011.  . Carotid artery disease ([place])    a. 1-50% bilateral (upper end) - March 2011.  Marland Kitchen CHF (congestive heart failure) ([place])   . DJD (degenerative joint disease)   . GERD (gastroesophageal reflux disease)   . Hemoptysis 05/2016  . Hyperlipidemia   . Hypertension   . Hypothyroid   . Persistent atrial fibrillation ([place]) 05/2011  . Second degree heart block    a. s/p PPM 2005. b. Gen change to [employer] by Dr Rayann Heman  9/11 for premature ERI 11/2009.    Current Outpatient Medications  Medication Sig Dispense Refill  . amLODipine (NORVASC) 5 MG tablet Take 1 tablet (5 mg total) by mouth daily. 30 tablet 8  . [START ON 06/09/2017] ampicillin (PRINCIPEN) 500 MG capsule Take 1 capsule (500 mg total) by mouth every 8 (eight) hours. 42 capsule 0  . ampicillin IVPB Inject 2 g into the vein  every 6 (six) hours for 12 days. Indication:  Enterococcus bacteremia Last Day of Therapy: 06/09/17 Labs - Once weekly:  CBC/D and BMP, Labs - Every other week:  ESR and CRP 48 Units 0  . aspirin EC 81 MG tablet Take 81 mg by mouth every evening.     Marland Kitchen BYSTOLIC 5 MG tablet TAKE 1/2 TABLET BY MOUTH DAILY 15 tablet 11  . calcium carbonate (TUMS - DOSED IN MG ELEMENTAL CALCIUM) 500 MG chewable tablet Chew 1 tablet (200 mg of elemental calcium total) by mouth as needed for indigestion or heartburn.    . co-enzyme Q-10 30 MG capsule Take 30 mg by mouth daily.    . fish oil-omega-3 fatty acids 1000 MG capsule Take 2 g daily by mouth.     . furosemide (LASIX) 40 MG tablet Take 1 tablet (40 mg total) by mouth daily. May take an extra tablet for wt gain of 3 lbs overnight or 5lbs  in a week. 40 tablet 3  . latanoprost (XALATAN) 0.005 % ophthalmic solution Place 1 drop into both eyes at bedtime.      Marland Kitchen levothyroxine (SYNTHROID, LEVOTHROID) 88 MCG tablet Take 88 mcg by mouth daily.     . Misc Natural Products (OSTEO BI-FLEX JOINT SHIELD PO) Take 1 tablet by mouth daily.    . Misc Natural Products (TART CHERRY ADVANCED) CAPS Take 1 capsule by mouth daily.    . Multiple Vitamins-Minerals (CENTRUM SILVER PO) Take 1 tablet by mouth daily.      Marland Kitchen omeprazole (PRILOSEC) 20 MG capsule Take 20 mg by mouth 2 (two) times daily before a meal. Take once or twice a day    . potassium chloride SA (K-DUR,KLOR-CON) 20 MEQ tablet Take 1 tablet (20 mEq total) by mouth daily. Take 1 extra tab when you take extra Lasix 40 tablet 6  . rosuvastatin (CRESTOR) 20 MG tablet Take 10 mg by mouth 3 (three) times a week. Tue, Thur and Sat    . saccharomyces boulardii (FLORASTOR) 250 MG capsule Take 1 capsule (250 mg total) by mouth 2 (two) times daily. 60 capsule 0  . timolol (BETIMOL) 0.5 % ophthalmic solution Place 1 drop into both eyes every morning.     Alveda Reasons 20 MG TABS tablet TAKE 1 TABLET BY MOUTH DAILY WITH SUPPER 30 tablet 5   No current facility-administered medications for this encounter.     Vitals:   06/04/17 1043  BP: 124/62  Pulse: 62  SpO2: 100%  Weight: 172 lb 8 oz (78.2 kg)   Filed Weights   06/04/17 1043  Weight: 172 lb 8 oz (78.2 kg)   PHYSICAL EXAM: General:  Elderly. Well appearing. No resp difficulty HEENT: normal Neck: supple. JVP 7 Carotids 2+ bilat; no bruits. No lymphadenopathy or thryomegaly appreciated. Cor: PMI nondisplaced. Irregular rate & rhythm. 2/6 TR Lungs: clear Abdomen: soft, nontender, nondistended. No hepatosplenomegaly. No bruits or masses. Good bowel sounds. Extremities: no cyanosis, clubbing, rash, edema RUE PICC with some erythema.  Neuro: alert & orientedx3, cranial nerves grossly intact. moves all 4 extremities w/o difficulty. Affect  pleasant  ASSESSMENT & PLAN: 1. Recent enterococcal sepsis - plan for 4 weeks of abx per ID - Explained small risk of device colonization and would consider getting surveillance bcx after abx stopped.  - Given proximity to dental procedure would use SBE prophylaxis for all subsequent dental proceudres  2. Chronic Diastolic Heart Failure - ECHO 05/2017 EF 55-60%.  - Volume status looks good  3. Permanent A fib-  - Rate controlled. On Xarelto. No bleeding   4. HTN - Blood pressure well controlled. Continue current regimen.  Beth Bickers MD 10:59 AM

## 2017-06-04 NOTE — Addendum Note (Signed)
Encounter addended by: Noralee SpaceSchub, Augusten Lipkin M, RN on: 06/04/2017 11:26 AM  Actions taken: Pharmacy for encounter modified, Order list changed, Sign clinical note

## 2017-06-04 NOTE — Telephone Encounter (Signed)
Thank you She will need repeat BCx after finishing anbx When is her f/u appt with me? thanks

## 2017-06-04 NOTE — Patient Instructions (Signed)
You will need to take Amoxicillin 2000 mg (4 tabs) 1 hour before any Dental Appointments  We will contact you in 4 months to schedule your next appointment.

## 2017-06-05 ENCOUNTER — Other Ambulatory Visit: Payer: Self-pay | Admitting: Internal Medicine

## 2017-06-07 NOTE — Telephone Encounter (Signed)
She has 2 weeks IV antibiotics (ending 3/18), 2 weeks of oral antibiotics (ending 4/1). Do you want to see her week of 3/25 or week of 4/8?

## 2017-06-07 NOTE — Telephone Encounter (Signed)
Notes say PCP follow up? Clinic Follow Up Appt: PCP

## 2017-06-07 NOTE — Telephone Encounter (Signed)
4-8 thanks

## 2017-06-07 NOTE — Telephone Encounter (Signed)
She needs to come back and see me 1 week after finnishing anbx thanks

## 2017-06-09 ENCOUNTER — Telehealth (HOSPITAL_COMMUNITY): Payer: Self-pay | Admitting: *Deleted

## 2017-06-09 ENCOUNTER — Other Ambulatory Visit: Payer: Self-pay | Admitting: Pharmacist

## 2017-06-09 NOTE — Telephone Encounter (Signed)
Kim with [facility] called to notify our clinic about patients lab results. Creat-0.39 and BUN- 7. [facility] drew labs for infectious disease and was told to contact our office with results. I left a VM at infectious disease asking them to fax results over so that our providers could review. Labs results had not been scanned into epic.

## 2017-06-09 NOTE — Telephone Encounter (Signed)
Message routed to RosebushAndy until we receive faxed labs.  *please see previous note*

## 2017-06-09 NOTE — Telephone Encounter (Signed)
No concerns with these labs.  Beth NeedleMichael [address]"Andy" Vintonillery, PA-C 06/09/2017 3:05 PM

## 2017-06-16 ENCOUNTER — Encounter: Payer: Self-pay | Admitting: Infectious Diseases

## 2017-06-16 ENCOUNTER — Inpatient Hospital Stay: Payer: Medicare Other | Admitting: Infectious Diseases

## 2017-06-16 ENCOUNTER — Encounter (HOSPITAL_COMMUNITY): Payer: Self-pay | Admitting: Internal Medicine

## 2017-06-21 ENCOUNTER — Telehealth: Payer: Self-pay | Admitting: Internal Medicine

## 2017-06-21 ENCOUNTER — Other Ambulatory Visit (HOSPITAL_COMMUNITY): Payer: Self-pay | Admitting: Internal Medicine

## 2017-06-21 NOTE — Telephone Encounter (Signed)
Returned call to patient's daughter. She was calling to make Dr. Johney FrameAllred aware of patient's recent infection. She states that the patient went to the dentist on 3/1 and started feeling sick on 3/3. Patient was admitted on 3/4 with sepsis. Blood cultures were positive for enterococcus. Patient had a TEE on 3/7 that showed no vegetations on valves or pacer wires. Patient is to receive antibiotics for 4 weeks per ID. Patient was seen by Dr. Gala RomneyBensimhon on 3/15 and he explained that there is a small risk of device colonization and that he would consider getting surveillance blood cultures after her antibiotics are complete. He also instructed the patient to use SBE prophylaxis prior to any dental procedures. Patient has f/u appointment with ID on 4/10. Made daughter aware that I would forward the information to Dr. Johney FrameAllred to make him aware.

## 2017-06-21 NOTE — Telephone Encounter (Signed)
New Message:   FYI: Pt's daughter is stating pt got a blood infection from the 4th to the 8th of April and was placed on antibiotics and now on oral antibiotics until Wednesday. Pt has a follow up appt on wed the 10th and this is a infection doctor and hopefully she doesn't have any infection in her pacemaker.

## 2017-06-30 ENCOUNTER — Ambulatory Visit (INDEPENDENT_AMBULATORY_CARE_PROVIDER_SITE_OTHER): Payer: Medicare Other | Admitting: Infectious Diseases

## 2017-06-30 ENCOUNTER — Encounter: Payer: Self-pay | Admitting: Infectious Diseases

## 2017-06-30 DIAGNOSIS — B952 Enterococcus as the cause of diseases classified elsewhere: Secondary | ICD-10-CM

## 2017-06-30 DIAGNOSIS — R7881 Bacteremia: Secondary | ICD-10-CM | POA: Diagnosis not present

## 2017-06-30 DIAGNOSIS — Z950 Presence of cardiac pacemaker: Secondary | ICD-10-CM

## 2017-06-30 NOTE — Progress Notes (Signed)
   Subjective:    Patient ID: Beth Santiago, female    DOB: [birth year], 84 y.o.   MRN: [id number]  HPI 84 y.o. female with hx of afib, diastolic, CHF, pacemaker placement 2011, HTN, dental cleaning 05-21-17; came to ED on 3-4 with 2 days of weakness, fever and chills. In ED she was hypotensive (74/57), had elevated lactate (3.29). Her WBC was 14k, her CXR was (-). Her UA was unremarkable (0-5 WBC/RBC/SE) She was started on vanco/zosyn. Then changed to ampicillin for planned 2 week course (end 06-07-17) to be followed by po amoxil.  She was found to have enterococcal bacteremia. Her TEE was (-).  She was d/c home on 3-8.   Her PIC is out. She has been feeling well.  She and her daughter have questions about how to prevent this from happening again. She had prev episode April 2018. No dental appt at that time. She was suspected to have PNA and UTI at that time.  She is still getting in home care and PT.   Review of Systems  Constitutional: Negative for appetite change, chills, fever and unexpected weight change.  HENT: Positive for hearing loss.   Gastrointestinal: Negative for blood in stool, constipation and diarrhea.  Genitourinary: Negative for difficulty urinating, dysuria and hematuria.  Psychiatric/Behavioral: Negative for sleep disturbance.       Objective:   Physical Exam  Constitutional: She is oriented to person, place, and time. She appears well-developed and well-nourished.  HENT:  Mouth/Throat: Abnormal dentition. No oropharyngeal exudate.  Eyes: Pupils are equal, round, and reactive to light. EOM are normal.  Neck: Neck supple.  Cardiovascular: Normal rate, regular rhythm and normal heart sounds.  Pulmonary/Chest: Effort normal and breath sounds normal.    Abdominal: Soft. Bowel sounds are normal. There is no tenderness. There is no rebound.  Musculoskeletal: She exhibits no edema.  Lymphadenopathy:    She has no cervical adenopathy.  Neurological: She is alert  and oriented to person, place, and time.  Psychiatric: She has a normal mood and affect.       Assessment & Plan:

## 2017-06-30 NOTE — Assessment & Plan Note (Signed)
Her TEE was negative I explained to pt and her daughter that this is not a 100% perfect test that she is not colonized on her pacer.

## 2017-06-30 NOTE — Assessment & Plan Note (Signed)
She finished her amoxil her 4-4.  Will repeat her BCx at this time. Being off anbx should give her a better chance at having a positive if she is still colonized.  I suggested keeping her on chronic, suppressive amoxil however her daughter wants to defer this.  Will call them if any abn in her BCx.  Will see back prn.  Will call them next week.

## 2017-07-01 ENCOUNTER — Encounter: Payer: Self-pay | Admitting: Internal Medicine

## 2017-07-06 LAB — CULTURE, BLOOD (SINGLE)
MICRO NUMBER: 90442502
MICRO NUMBER:: 90442505
RESULT: NO GROWTH
Result:: NO GROWTH
SPECIMEN QUALITY: ADEQUATE
SPECIMEN QUALITY:: ADEQUATE

## 2017-07-15 ENCOUNTER — Encounter: Payer: Self-pay | Admitting: Internal Medicine

## 2017-07-21 ENCOUNTER — Encounter: Payer: Self-pay | Admitting: Infectious Diseases

## 2017-07-22 ENCOUNTER — Telehealth: Payer: Self-pay | Admitting: Infectious Diseases

## 2017-07-22 ENCOUNTER — Telehealth (HOSPITAL_COMMUNITY): Payer: Self-pay

## 2017-07-22 NOTE — Telephone Encounter (Signed)
Agree.    Beth Santiago [address] [place], [place] 07/22/2017 9:31 PM

## 2017-07-22 NOTE — Telephone Encounter (Signed)
Daughter left voicemail on Beth Santiago's phone this afternoon and is wanting a nurse to call her concerning her mother.

## 2017-07-22 NOTE — Telephone Encounter (Signed)
Advanced Heart Failure Triage Encounter  Patient Name: Beth Santiago  Date of Call: 07/22/17  Problem:  Pt daughter called b/c pt has had congestion. cough, and tiredness x1 day. Pt daughter say that she has had some weight gain but she believes from eating, no edema in hands and feet . Daughter did give extra furosemide. Pt will see PCP tomorrow and has a f/u with Allred on 13th.  Plan:   Teresa Coombs, RN

## 2017-07-23 ENCOUNTER — Telehealth (HOSPITAL_COMMUNITY): Payer: Self-pay | Admitting: *Deleted

## 2017-07-23 NOTE — Telephone Encounter (Signed)
Pt's daughter called concerned about pt.  She states she took pt to see PCP (Dr Evlyn Kanner) this AM due to congestion and cough.  They did chest x-ray which was negative for pna, he feels it is r/t her HF and told her to take extra Lasix and potassium today and tomorrow, and Sunday if needed.  She just wanted [place] to be aware.  Advised if pt not feeling better by Monday to let us know, she is agreeable.

## 2017-07-23 NOTE — Telephone Encounter (Signed)
Beth Santiago is seeing Dr Johney Frame on 5/13.  Her daughter wanted to see if Dr Ninetta Lights had advice about the chronic, low dose ampicillin due to her pacemaker. Andree Coss, RN

## 2017-07-25 ENCOUNTER — Encounter: Payer: Self-pay | Admitting: Internal Medicine

## 2017-08-02 ENCOUNTER — Encounter: Payer: Self-pay | Admitting: Internal Medicine

## 2017-08-02 ENCOUNTER — Ambulatory Visit (INDEPENDENT_AMBULATORY_CARE_PROVIDER_SITE_OTHER): Payer: Medicare Other | Admitting: Internal Medicine

## 2017-08-02 ENCOUNTER — Other Ambulatory Visit: Payer: Self-pay | Admitting: Internal Medicine

## 2017-08-02 VITALS — BP 124/76 | HR 73 | Ht 66.0 in | Wt 169.0 lb

## 2017-08-02 DIAGNOSIS — I442 Atrioventricular block, complete: Secondary | ICD-10-CM | POA: Diagnosis not present

## 2017-08-02 DIAGNOSIS — I10 Essential (primary) hypertension: Secondary | ICD-10-CM

## 2017-08-02 DIAGNOSIS — I482 Chronic atrial fibrillation: Secondary | ICD-10-CM

## 2017-08-02 DIAGNOSIS — Z950 Presence of cardiac pacemaker: Secondary | ICD-10-CM | POA: Diagnosis not present

## 2017-08-02 DIAGNOSIS — I4821 Permanent atrial fibrillation: Secondary | ICD-10-CM

## 2017-08-02 LAB — CUP PACEART INCLINIC DEVICE CHECK
Battery Impedance: 1675 Ohm
Battery Voltage: 2.75 V
Brady Statistic RV Percent Paced: 100 %
Implantable Lead Implant Date: 20050927
Implantable Lead Location: 753860
Implantable Lead Model: 5076
Implantable Lead Model: 5076
Lead Channel Impedance Value: 485 Ohm
Lead Channel Pacing Threshold Amplitude: 1.75 V
Lead Channel Setting Pacing Amplitude: 3.25 V
Lead Channel Setting Pacing Pulse Width: 0.4 ms
MDC IDC LEAD IMPLANT DT: 20050927
MDC IDC LEAD LOCATION: 753859
MDC IDC MSMT BATTERY REMAINING LONGEVITY: 39 mo
MDC IDC MSMT LEADCHNL RA IMPEDANCE VALUE: 67 Ohm
MDC IDC MSMT LEADCHNL RV PACING THRESHOLD AMPLITUDE: 1.625 V
MDC IDC MSMT LEADCHNL RV PACING THRESHOLD PULSEWIDTH: 0.4 ms
MDC IDC MSMT LEADCHNL RV PACING THRESHOLD PULSEWIDTH: 0.4 ms
MDC IDC PG IMPLANT DT: 20110921
MDC IDC SESS DTM: 20190513153702
MDC IDC SET LEADCHNL RV SENSING SENSITIVITY: 2.8 mV

## 2017-08-02 NOTE — Patient Instructions (Addendum)
Medication Instructions:  Your physician recommends that you continue on your current medications as directed. Please refer to the Current Medication list given to you today.  Labwork: None ordered.  Testing/Procedures: None ordered.  Follow-Up: Your physician wants you to follow-up in: 6 months with Francis Dowse, PA.   You will receive a reminder letter in the mail two months in advance. If you don't receive a letter, please call our office to schedule the follow-up appointment.  Remote monitoring is used to monitor your Pacemaker from home. This monitoring reduces the number of office visits required to check your device to one time per year. It allows [place] to keep an eye on the functioning of your device to ensure it is working properly. You are scheduled for a device check from home on 11/01/2017. You may send your transmission at any time that day. If you have a wireless device, the transmission will be sent automatically. After your physician reviews your transmission, you will receive a postcard with your next transmission date.  Any Other Special Instructions Will Be Listed Below (If Applicable).  If you need a refill on your cardiac medications before your next appointment, please call your pharmacy.

## 2017-08-02 NOTE — Progress Notes (Signed)
PCP: Adrian Prince, MD Primary Cardiologist: Dr Gala Romney Primary EP:  Dr Johney Frame  Beth Santiago is a 84 y.o. female who presents today for routine electrophysiology followup.  Since last being seen in our clinic, the patient reports doing reasonably well. She was hospitalized in March with enterococcal bacteremia.  After a month of therapy, follow-up cultures off Abx did not show persistence of bacteremia.  She has been followed by Dr Ninetta Lights.  She has URI symptoms with cough today.  She took Zpak last week and thinks that she is getting better.   Today, she denies symptoms of palpitations, chest pain, shortness of breath,  lower extremity edema, dizziness, presyncope, or syncope.  The patient is otherwise without complaint today.   Past Medical History:  Diagnosis Date  . Atrial flutter (HCC)    a. Noted during pacemaker implantation 2011, spontaneously terminated.  . Biatrial enlargement    severe  . CAD (coronary artery disease)    a. Nonobstructive by cath 2005 - 40-50% mid LAD. b. Nonischemic nuc 05/2011.  . Carotid artery disease (HCC)    a. 1-50% bilateral (upper end) - March 2011.  Marland Kitchen CHF (congestive heart failure) (HCC)   . DJD (degenerative joint disease)   . GERD (gastroesophageal reflux disease)   . Hemoptysis 05/2016  . Hyperlipidemia   . Hypertension   . Hypothyroid   . Persistent atrial fibrillation (HCC) 05/2011  . Second degree heart block    a. s/p PPM 2005. b. Gen change to Medtronic by Dr Johney Frame  9/11 for premature ERI 11/2009.   Past Surgical History:  Procedure Laterality Date  . ABDOMINAL SURGERY     twisted bowel  . CARDIOVERSION  06/11/2011   Procedure: CARDIOVERSION;  Surgeon: Dolores Patty, MD;  Location: [facility] ENDOSCOPY;  Service: Cardiovascular;  Laterality: N/A;  . CARDIOVERSION N/A 01/04/2013   Procedure: CARDIOVERSION;  Surgeon: Lars Masson, MD;  Location: [facility] ENDOSCOPY;  Service: Cardiovascular;  Laterality: N/A;  . COLON SURGERY    .  HEMORROIDECTOMY    . PACEMAKER INSERTION  2005, 12/11/09   implanted by Dr Reyes Ivan, generator change 9/11 by Fawn Kirk for premature ERI  . TEE WITHOUT CARDIOVERSION  06/11/2011   Procedure: TRANSESOPHAGEAL ECHOCARDIOGRAM (TEE);  Surgeon: Dolores Patty, MD;  Location: [facility] ENDOSCOPY;  Service: Cardiovascular;  Laterality: N/A;  . TEE WITHOUT CARDIOVERSION N/A 05/27/2017   Procedure: TRANSESOPHAGEAL ECHOCARDIOGRAM (TEE);  Surgeon: Jake Bathe, MD;  Location: [facility] ENDOSCOPY;  Service: Cardiovascular;  Laterality: N/A;  . TONSILLECTOMY      ROS- all systems are reviewed and negative except as per HPI above  Current Outpatient Medications  Medication Sig Dispense Refill  . amLODipine (NORVASC) 5 MG tablet Take 1 tablet (5 mg total) by mouth daily. 30 tablet 8  . BYSTOLIC 5 MG tablet TAKE 1/2 TABLET BY MOUTH DAILY 15 tablet 11  . calcium carbonate (TUMS - DOSED IN MG ELEMENTAL CALCIUM) 500 MG chewable tablet Chew 1 tablet (200 mg of elemental calcium total) by mouth as needed for indigestion or heartburn.    . co-enzyme Q-10 30 MG capsule Take 30 mg by mouth daily.    . fish oil-omega-3 fatty acids 1000 MG capsule Take 2 g daily by mouth.     . furosemide (LASIX) 40 MG tablet Take 1 tablet (40 mg total) by mouth daily. May take an extra tablet for wt gain of 3 lbs overnight or 5lbs in a week. 40 tablet 3  . latanoprost (XALATAN) 0.005 %  ophthalmic solution Place 1 drop into both eyes at bedtime.      Marland Kitchen levothyroxine (SYNTHROID, LEVOTHROID) 88 MCG tablet Take 88 mcg by mouth daily.     . Misc Natural Products (OSTEO BI-FLEX JOINT SHIELD PO) Take 1 tablet by mouth daily.    . Misc Natural Products (TART CHERRY ADVANCED) CAPS Take 1 capsule by mouth daily.    . Multiple Vitamins-Minerals (CENTRUM SILVER PO) Take 1 tablet by mouth daily.      Marland Kitchen omeprazole (PRILOSEC) 20 MG capsule Take 20 mg by mouth 2 (two) times daily before a meal. Take once or twice a day    . potassium chloride SA (K-DUR,KLOR-CON) 20  MEQ tablet TAKE 1 TABLET BY MOUTH DAILY.  TAKE 1 EXTRA TABLET WHEN YOU TAKE EXTRA LASIX. 40 tablet 6  . rosuvastatin (CRESTOR) 20 MG tablet Take 10 mg by mouth 3 (three) times a week. Tue, Thur and Sat    . saccharomyces boulardii (FLORASTOR) 250 MG capsule Take 1 capsule (250 mg total) by mouth 2 (two) times daily. 60 capsule 0  . timolol (BETIMOL) 0.5 % ophthalmic solution Place 1 drop into both eyes every morning.     Carlena Hurl 20 MG TABS tablet TAKE 1 TABLET BY MOUTH DAILY WITH SUPPER 30 tablet 5  . amoxicillin (AMOXIL) 500 MG tablet Take 4 tablets (2,000 mg total) by mouth as needed (1 hour prior to dental work). (Patient not taking: Reported on 08/02/2017) 4 tablet 6   No current facility-administered medications for this visit.     Physical Exam: Vitals:   08/02/17 1151  BP: 124/76  Pulse: 73  Weight: 169 lb (76.7 kg)  Height:  (1.676 m)    GEN- The patient is well appearing, alert and oriented x 3 today.   Head- normocephalic, atraumatic Eyes-  Sclera clear, conjunctiva pink Ears- hearing intact Oropharynx- clear Lungs- coarse BS which clear with cough, normal work of breathing Chest- pacemaker pocket is well healed Heart- Regular rate and rhythm (paced), 3/6 SEM LLSB GI- soft, NT, ND, + BS Extremities- no clubbing, cyanosis, or edema  Pacemaker interrogation- reviewed in detail today,  See PACEART report   Assessment and Plan:  1. Symptomatic complete heart block Normal pacemaker function See Pace Art report No changes today Recent enterococcus bacteremia discussed with her today. Given her advanced age and complete heart block, she would be high risk for extraction.  Long term suppressive antibiotics may be a better option if required.  I have asked her to have further conversations with Dr Ninetta Lights who has apparently discussed suppressive antibiotics on their last visit.  2. permanent afib Rate controlled On xarelto  3. HTN Stable No change required  today  4. URI Improving s/p Zpak Follow-up with Dr Tammi Sou Return to see EP PA in 6 months  [name] MD, [facility] 08/02/2017 12:20 PM

## 2017-09-01 ENCOUNTER — Other Ambulatory Visit: Payer: Self-pay | Admitting: Internal Medicine

## 2017-09-20 ENCOUNTER — Other Ambulatory Visit: Payer: Self-pay | Admitting: Internal Medicine

## 2017-09-20 ENCOUNTER — Other Ambulatory Visit (HOSPITAL_COMMUNITY): Payer: Self-pay | Admitting: Internal Medicine

## 2017-11-01 ENCOUNTER — Encounter: Payer: Medicare Other | Admitting: *Deleted

## 2017-11-01 ENCOUNTER — Telehealth: Payer: Self-pay | Admitting: Cardiology

## 2017-11-01 NOTE — Telephone Encounter (Signed)
Spoke with pt and reminded pt of remote transmission that is due today. Pt verbalized understanding.   

## 2017-11-12 ENCOUNTER — Ambulatory Visit (INDEPENDENT_AMBULATORY_CARE_PROVIDER_SITE_OTHER): Payer: Medicare Other | Admitting: *Deleted

## 2017-11-12 DIAGNOSIS — I442 Atrioventricular block, complete: Secondary | ICD-10-CM

## 2017-11-15 ENCOUNTER — Encounter: Payer: Self-pay | Admitting: Cardiology

## 2017-11-15 NOTE — Progress Notes (Signed)
Remote pacemaker transmission.   

## 2017-12-03 LAB — CUP PACEART REMOTE DEVICE CHECK
Battery Impedance: 1765 Ohm
Battery Remaining Longevity: 42 mo
Battery Voltage: 2.75 V
Implantable Lead Implant Date: 20050927
Implantable Lead Implant Date: 20050927
Implantable Lead Location: 753860
Implantable Lead Model: 5076
Lead Channel Impedance Value: 480 Ohm
Lead Channel Pacing Threshold Amplitude: 1.25 V
Lead Channel Setting Pacing Pulse Width: 0.46 ms
Lead Channel Setting Sensing Sensitivity: 2.8 mV
MDC IDC LEAD LOCATION: 753859
MDC IDC MSMT LEADCHNL RA IMPEDANCE VALUE: 67 Ohm
MDC IDC MSMT LEADCHNL RV PACING THRESHOLD PULSEWIDTH: 0.4 ms
MDC IDC PG IMPLANT DT: 20110921
MDC IDC SESS DTM: 20190823193759
MDC IDC SET LEADCHNL RV PACING AMPLITUDE: 2.5 V
MDC IDC STAT BRADY RV PERCENT PACED: 99 %

## 2017-12-29 ENCOUNTER — Ambulatory Visit (HOSPITAL_COMMUNITY)
Admission: RE | Admit: 2017-12-29 | Discharge: 2017-12-29 | Disposition: A | Discharge status: Home / Self Care | Payer: Medicare Other | Source: Ambulatory Visit | Attending: Internal Medicine | Admitting: Internal Medicine

## 2017-12-29 ENCOUNTER — Other Ambulatory Visit: Payer: Self-pay

## 2017-12-29 VITALS — BP 112/61 | HR 65 | Wt 165.6 lb

## 2017-12-29 DIAGNOSIS — Z79890 Hormone replacement therapy (postmenopausal): Secondary | ICD-10-CM | POA: Diagnosis not present

## 2017-12-29 DIAGNOSIS — Z950 Presence of cardiac pacemaker: Secondary | ICD-10-CM | POA: Diagnosis not present

## 2017-12-29 DIAGNOSIS — E785 Hyperlipidemia, unspecified: Secondary | ICD-10-CM | POA: Insufficient documentation

## 2017-12-29 DIAGNOSIS — I4821 Permanent atrial fibrillation: Secondary | ICD-10-CM | POA: Insufficient documentation

## 2017-12-29 DIAGNOSIS — I2510 Atherosclerotic heart disease of native coronary artery without angina pectoris: Secondary | ICD-10-CM | POA: Insufficient documentation

## 2017-12-29 DIAGNOSIS — I110 Hypertensive heart disease with heart failure: Secondary | ICD-10-CM | POA: Diagnosis not present

## 2017-12-29 DIAGNOSIS — E039 Hypothyroidism, unspecified: Secondary | ICD-10-CM | POA: Diagnosis not present

## 2017-12-29 DIAGNOSIS — Z79899 Other long term (current) drug therapy: Secondary | ICD-10-CM | POA: Diagnosis not present

## 2017-12-29 DIAGNOSIS — K219 Gastro-esophageal reflux disease without esophagitis: Secondary | ICD-10-CM | POA: Diagnosis not present

## 2017-12-29 DIAGNOSIS — Z8249 Family history of ischemic heart disease and other diseases of the circulatory system: Secondary | ICD-10-CM | POA: Insufficient documentation

## 2017-12-29 DIAGNOSIS — M25561 Pain in right knee: Secondary | ICD-10-CM | POA: Diagnosis not present

## 2017-12-29 DIAGNOSIS — I5032 Chronic diastolic (congestive) heart failure: Secondary | ICD-10-CM | POA: Insufficient documentation

## 2017-12-29 DIAGNOSIS — I480 Paroxysmal atrial fibrillation: Secondary | ICD-10-CM | POA: Diagnosis not present

## 2017-12-29 DIAGNOSIS — Z7901 Long term (current) use of anticoagulants: Secondary | ICD-10-CM | POA: Insufficient documentation

## 2017-12-29 DIAGNOSIS — M1990 Unspecified osteoarthritis, unspecified site: Secondary | ICD-10-CM | POA: Insufficient documentation

## 2017-12-29 DIAGNOSIS — I4892 Unspecified atrial flutter: Secondary | ICD-10-CM | POA: Diagnosis not present

## 2017-12-29 DIAGNOSIS — I441 Atrioventricular block, second degree: Secondary | ICD-10-CM | POA: Diagnosis not present

## 2017-12-29 LAB — BASIC METABOLIC PANEL
Anion gap: 9 (ref 5–15)
BUN: 7 mg/dL — AB (ref 8–23)
CO2: 23 mmol/L (ref 22–32)
Calcium: 9.1 mg/dL (ref 8.9–10.3)
Chloride: 102 mmol/L (ref 98–111)
Creatinine, Ser: 0.55 mg/dL (ref 0.44–1.00)
GFR calc Af Amer: >60 mL/min (ref >60)
GLUCOSE: 137 mg/dL — AB (ref 70–99)
POTASSIUM: 3.5 mmol/L (ref 3.5–5.1)
SODIUM: 134 mmol/L — AB (ref 135–145)

## 2017-12-29 LAB — CBC
HEMATOCRIT: 42.7 % (ref 36.0–46.0)
HEMOGLOBIN: 13 g/dL (ref 12.0–15.0)
MCH: 27.5 pg (ref 26.0–34.0)
MCHC: 30.4 g/dL (ref 30.0–36.0)
MCV: 90.5 fL (ref 80.0–100.0)
Platelets: 231 10*3/uL (ref 150–400)
RBC: 4.72 MIL/uL (ref 3.87–5.11)
RDW: 16.9 % — ABNORMAL HIGH (ref 11.5–15.5)
WBC: 6.1 10*3/uL (ref 4.0–10.5)
nRBC: 0 % (ref 0.0–0.2)

## 2017-12-29 NOTE — Progress Notes (Signed)
ADVANCED HF CLINIC NOTE  PCP: Dr Evlyn Kanner  Primary HF Cardiologist: Dr Gala Romney  HPI: Ms Beth Santiago is an 84 year old with a history of , HTN, hyperlipidemia, permanent a fib, CHB PPM, nonobstructive CAD cath 2007, and chronic diastolic heart failure. Follows with Dr. Johney Frame for her PPM   Admitted the March 2018 with R knee pain and dyspnea. Treated for severe pneumonia and heart failure. Diuresed with IV lasix and transitioned to po lasix 40 mg daily. Discharge weight was 181 pounds. Discharged to [facility].   Admitted 05/24/17 with sepsis. Bcx + enterococcus. TEE 05/27/17  Normal LVEF Severe TR. No vegetations on valves or pacer. Had dentist appointment 3 days before. Had PICC line and plan for IV ampicillin for 2 weeks and then 2 weeks amoxicillin.   Here with her daughter for 6 month f/u. Feels well. Daughter living in her house with her. Still struggling with R knee pain. Trying SynVisc shots in both knees. Can get around the house and do ADLs. Limited much more by her knees and not her heart. Gets fatigued easily. No edema, orthopnea or PND. Takes lasix 40mg  daily.   TEE EF 55-60% ECHO 06/19/2016: EF 60-65%.   ROS: All systems negative except as listed in HPI, PMH and Problem List.  SH:  Social History   Socioeconomic History  . Marital status: Widowed    Spouse name: Not on file  . Number of children: Not on file  . Years of education: Not on file  . Highest education level: Not on file  Occupational History  . Not on file  Social Needs  . Financial resource strain: Not on file  . Food insecurity:    Worry: Not on file    Inability: Not on file  . Transportation needs:    Medical: Not on file    Non-medical: Not on file  Tobacco Use  . Smoking status: Never Smoker  . Smokeless tobacco: Never Used  Substance and Sexual Activity  . Alcohol use: Yes    Comment: 05/2016    OCCASIONAL   . Drug use: No  . Sexual activity: Not on file  Lifestyle  . Physical  activity:    Days per week: Not on file    Minutes per session: Not on file  . Stress: Not on file  Relationships  . Social connections:    Talks on phone: Not on file    Gets together: Not on file    Attends religious service: Not on file    Active member of club or organization: Not on file    Attends meetings of clubs or organizations: Not on file    Relationship status: Not on file  . Intimate partner violence:    Fear of current or ex partner: Not on file    Emotionally abused: Not on file    Physically abused: Not on file    Forced sexual activity: Not on file  Other Topics Concern  . Not on file  Social History Narrative  . Not on file    FH:  Family History  Problem Relation Age of Onset  . Cancer Mother   . Diabetes Mother   . Hypertension Mother   . Heart disease Father   . Cancer Brother   . Heart disease Brother     Past Medical History:  Diagnosis Date  . Atrial flutter (HCC)    a. Noted during pacemaker implantation 2011, spontaneously terminated.  . Biatrial enlargement  severe  . CAD (coronary artery disease)    a. Nonobstructive by cath 2005 - 40-50% mid LAD. b. Nonischemic nuc 05/2011.  . Carotid artery disease (HCC)    a. 1-50% bilateral (upper end) - March 2011.  Marland Kitchen CHF (congestive heart failure) (HCC)   . DJD (degenerative joint disease)   . GERD (gastroesophageal reflux disease)   . Hemoptysis 05/2016  . Hyperlipidemia   . Hypertension   . Hypothyroid   . Persistent atrial fibrillation (HCC) 05/2011  . Second degree heart block    a. s/p PPM 2005. b. Gen change to Medtronic by Dr Johney Frame  9/11 for premature ERI 11/2009.    Current Outpatient Medications  Medication Sig Dispense Refill  . amLODipine (NORVASC) 5 MG tablet TAKE 1 TABLET BY MOUTH DAILY 90 tablet 3  . amoxicillin (AMOXIL) 500 MG tablet Take 4 tablets (2,000 mg total) by mouth as needed (1 hour prior to dental work). 4 tablet 6  . BYSTOLIC 5 MG tablet TAKE 1/2 TABLET BY MOUTH  DAILY 45 tablet 2  . calcium carbonate (TUMS - DOSED IN MG ELEMENTAL CALCIUM) 500 MG chewable tablet Chew 1 tablet (200 mg of elemental calcium total) by mouth as needed for indigestion or heartburn.    . co-enzyme Q-10 30 MG capsule Take 30 mg by mouth daily.    . fish oil-omega-3 fatty acids 1000 MG capsule Take 2 g daily by mouth.     . furosemide (LASIX) 40 MG tablet TAKE 1 TABLET BY MOUTH DAILY, MAY TAKE AN EXTRA TABLET FOR WEIGHT GAIN OF 3 POUNDS OVERNIGHT OR 5 POUNDS IN A WEEK 40 tablet 3  . levothyroxine (SYNTHROID, LEVOTHROID) 88 MCG tablet Take 88 mcg by mouth daily.     . Misc Natural Products (OSTEO BI-FLEX JOINT SHIELD PO) Take 1 tablet by mouth daily.    . Misc Natural Products (TART CHERRY ADVANCED) CAPS Take 1 capsule by mouth daily.    . Multiple Vitamins-Minerals (CENTRUM SILVER PO) Take 1 tablet by mouth daily.      Marland Kitchen omeprazole (PRILOSEC) 20 MG capsule Take 20 mg by mouth 2 (two) times daily before a meal. Take once or twice a day    . potassium chloride SA (K-DUR,KLOR-CON) 20 MEQ tablet TAKE 1 TABLET BY MOUTH DAILY.  TAKE 1 EXTRA TABLET WHEN YOU TAKE EXTRA LASIX. 40 tablet 6  . rosuvastatin (CRESTOR) 20 MG tablet Take 10 mg by mouth 3 (three) times a week. Tue, Thur and Sat    . timolol (BETIMOL) 0.5 % ophthalmic solution Place 1 drop into both eyes every morning.     Carlena Hurl 20 MG TABS tablet TAKE 1 TABLET BY MOUTH DAILY WITH SUPPER 30 tablet 5   No current facility-administered medications for this encounter.     Vitals:   12/29/17 1132  BP: 112/61  Pulse: 65  SpO2: 99%  Weight: 75.1 kg (165 lb 9.6 oz)   Filed Weights   12/29/17 1132  Weight: 75.1 kg (165 lb 9.6 oz)   PHYSICAL EXAM: General:  Elderly. Well appearing. No resp difficulty HEENT: normal Neck: supple. JVP 7-8. Carotids 2+ bilat; no bruits. No lymphadenopathy or thryomegaly appreciated. Cor: PMI nondisplaced. Irregular rate & rhythm. No rubs, gallops or murmurs. Lungs: clear Abdomen: soft,  nontender, nondistended. No hepatosplenomegaly. No bruits or masses. Good bowel sounds. Extremities: no cyanosis, clubbing, rash, edema Neuro: alert & orientedx3, cranial nerves grossly intact. moves all 4 extremities w/o difficulty. Affect pleasant   ASSESSMENT & PLAN:  1. Chronic Diastolic Heart Failure - ECHO 06/979 EF 55-60%.  - Volume status looks good. Continue lasix 40mg  daily. Can take extra dose with kcl as needed  2. Permanent A fib-  - Rate controlled. On Xarelto. No bleeding  - PPM followed by Dr. Johney Frame  3. HTN - Blood pressure well controlled. Continue current regimen.  4. H/o enterococcal sepsis 3/19 - Given proximity to dental procedure would use SBE prophylaxis for all subsequent dental proceudres  Arvilla Meres MD 12:00 PM

## 2017-12-29 NOTE — Addendum Note (Signed)
Encounter addended by: Noralee Space, RN on: 12/29/2017 12:12 PM  Actions taken: Order list changed, Diagnosis association updated, Sign clinical note

## 2017-12-29 NOTE — Patient Instructions (Signed)
Labs today  We will contact you in 6 months to schedule your next appointment.  

## 2018-01-06 ENCOUNTER — Other Ambulatory Visit: Payer: Self-pay | Admitting: Internal Medicine

## 2018-01-07 ENCOUNTER — Encounter (HOSPITAL_COMMUNITY): Payer: Self-pay

## 2018-02-07 ENCOUNTER — Encounter: Payer: Medicare Other | Admitting: Physician Assistant

## 2018-02-14 ENCOUNTER — Telehealth: Payer: Self-pay | Admitting: Cardiology

## 2018-02-14 ENCOUNTER — Ambulatory Visit (INDEPENDENT_AMBULATORY_CARE_PROVIDER_SITE_OTHER): Payer: Medicare Other

## 2018-02-14 DIAGNOSIS — I442 Atrioventricular block, complete: Secondary | ICD-10-CM

## 2018-02-14 DIAGNOSIS — I5032 Chronic diastolic (congestive) heart failure: Secondary | ICD-10-CM

## 2018-02-14 NOTE — Telephone Encounter (Signed)
Confirmed remote transmission w/ pt daughter.   

## 2018-02-15 NOTE — Progress Notes (Signed)
Remote pacemaker transmission.   

## 2018-02-22 IMAGING — DX DG CHEST 1V PORT
1 series · 1 of 1 positions shown · non-contrast
Comparison: 06/18/2016

CLINICAL DATA: Pneumonia

EXAM:
PORTABLE CHEST 1 VIEW

[chest ap]
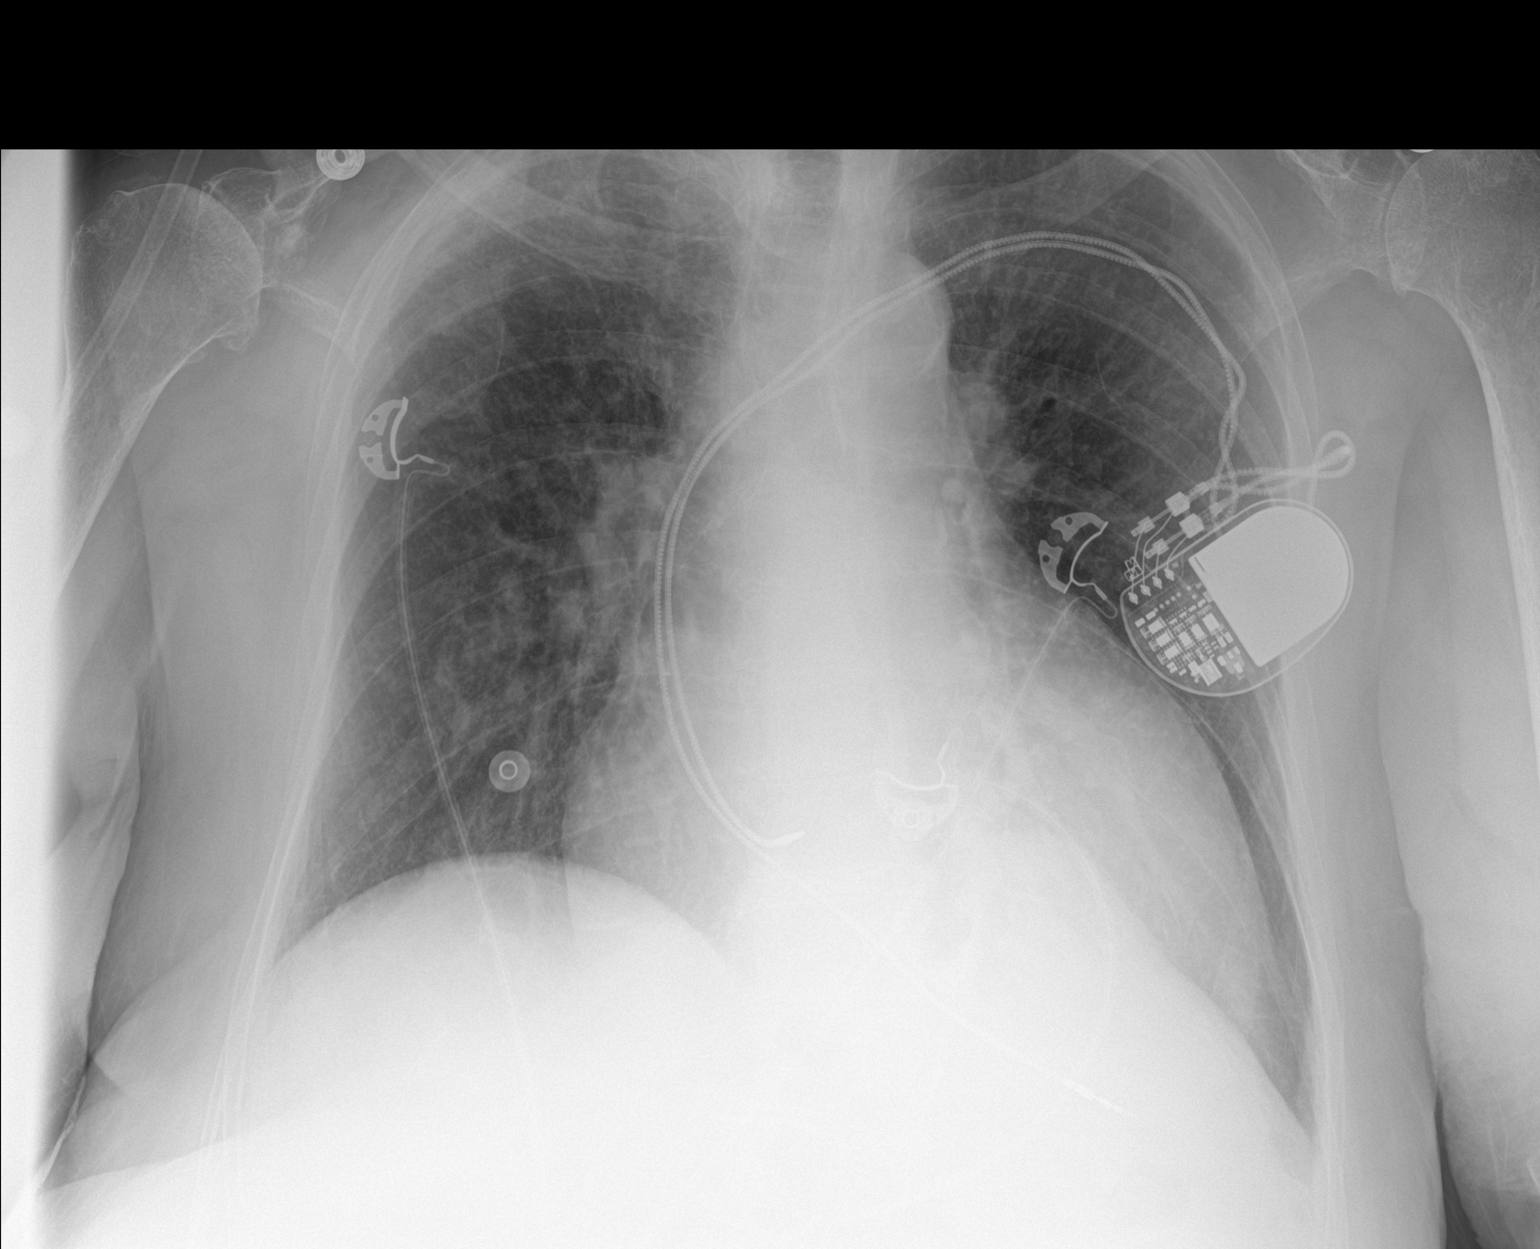

[1 of 1 positions shown; findings below may reference images not displayed]

FINDINGS: Cardiac shadow remains enlarged. A pacing device is again seen and
stable. Significant improvement in the degree of vascular congestion
is noted. There is also significant improvement in aeration in the
lungs bilaterally. Some left basilar atelectatic changes are seen as
well as mild changes in the right upper lobe. No new focal
infiltrate is seen.
IMPRESSION: Significant improvement in infiltrates as well as vascular
congestion when compared with the prior exam.

## 2018-02-24 ENCOUNTER — Other Ambulatory Visit (HOSPITAL_COMMUNITY): Payer: Self-pay

## 2018-02-24 ENCOUNTER — Other Ambulatory Visit: Payer: Self-pay | Admitting: Internal Medicine

## 2018-02-24 IMAGING — CR DG KNEE 1-2V PORT*R*
2 series · 2 of 2 positions shown · non-contrast
Comparison: 06/17/2016

CLINICAL DATA: Effusion into joint Generalized pain X 4 days.   If

EXAM:
PORTABLE RIGHT KNEE - 1-2 VIEW

[AP]
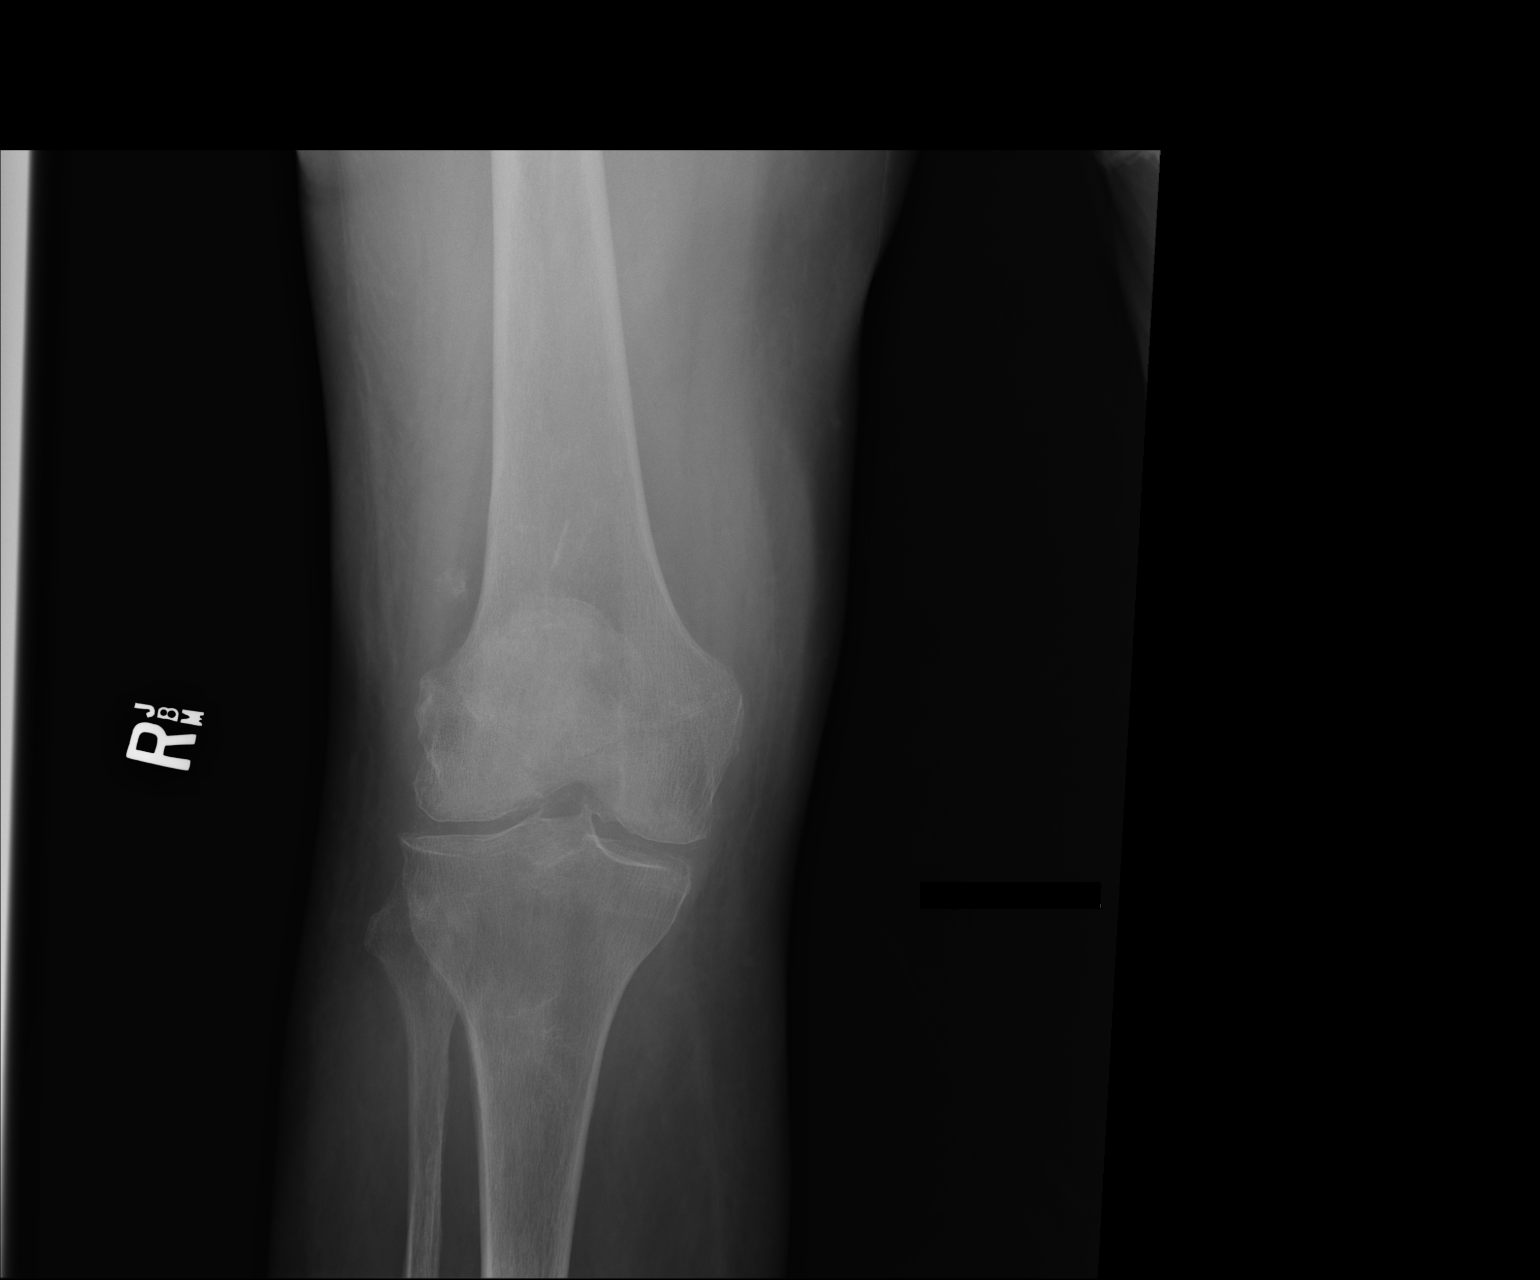

[xtable lateral]
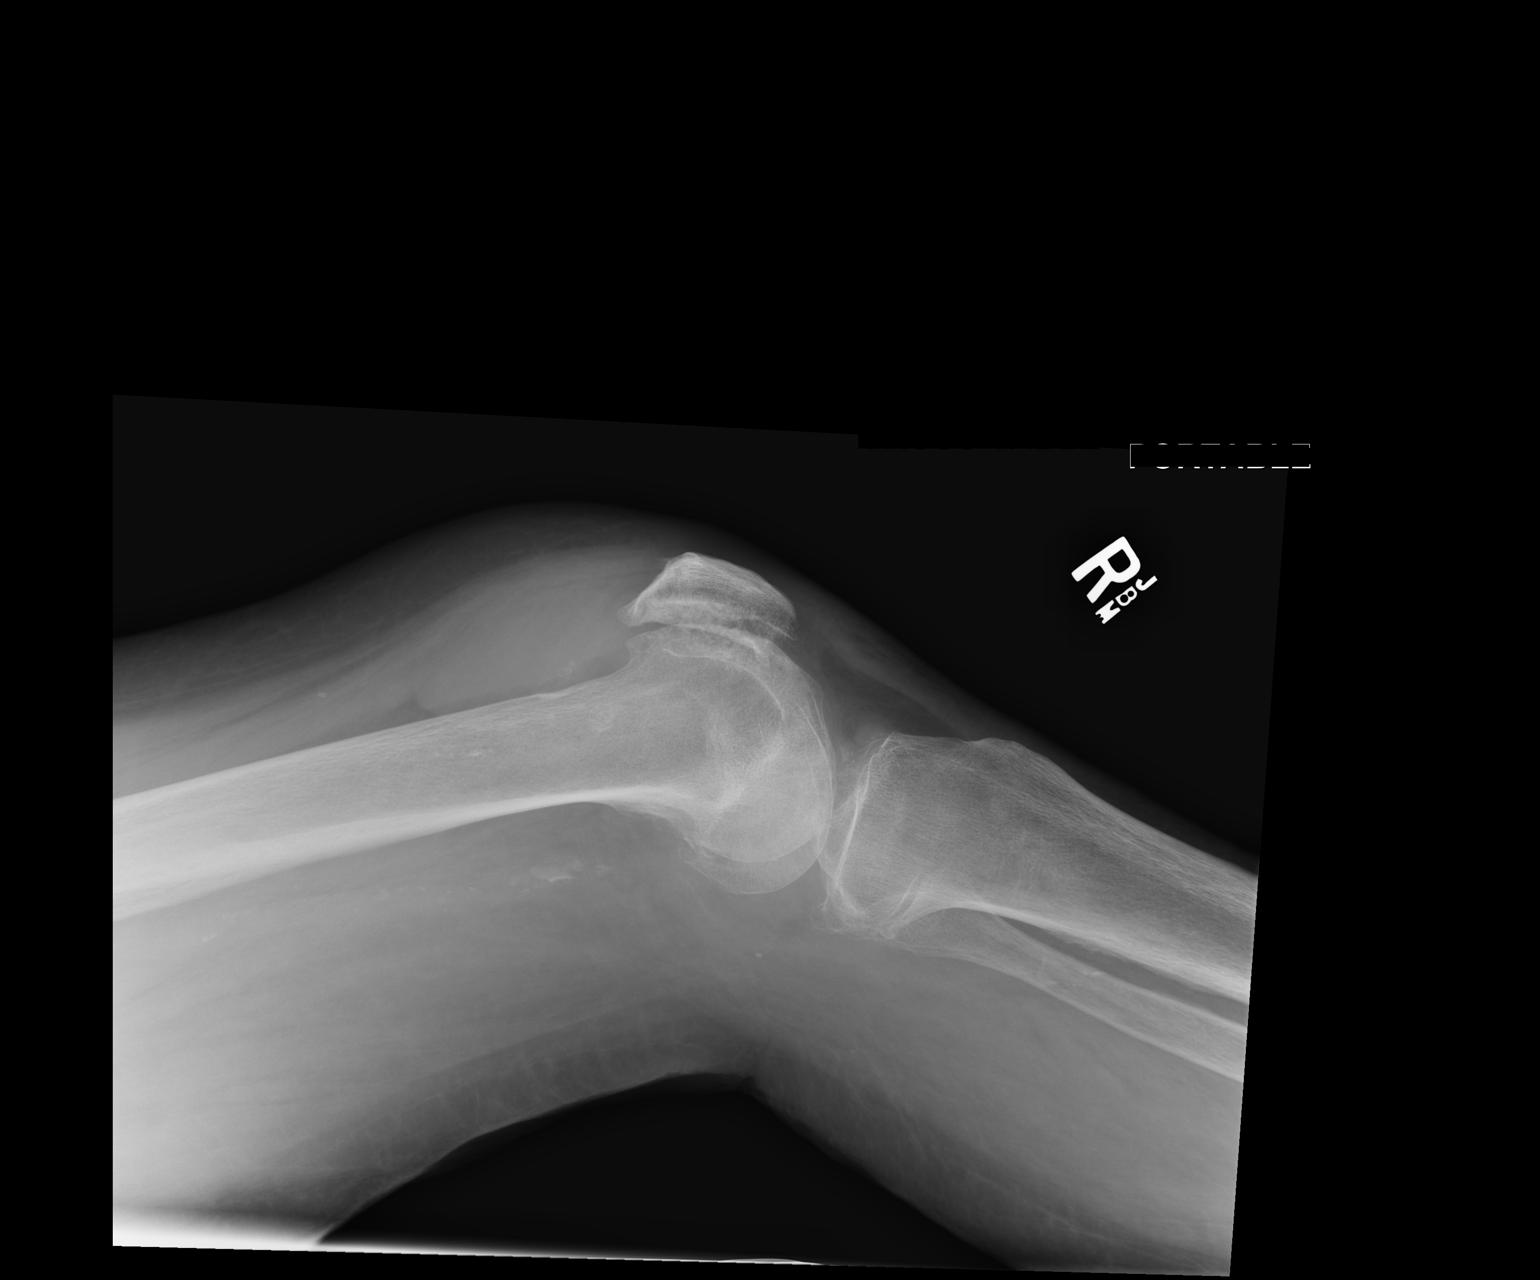

[2 of 2 positions shown; findings below may reference images not displayed]

FINDINGS: Moderate effusion in the suprapatellar bursa. Possible free
osteochondral body superolateral to the patella. Small marginal
spurs from the patellar articular surface and tibial plateau.
Chondrocalcinosis in medial and lateral compartments. Negative for
fracture or dislocation. Patchy femoral-popliteal arterial
calcifications.
IMPRESSION: 1. Tricompartmental degenerative change with moderate effusion,
possible free osteochondral body.
2. Chondrocalcinosis suggesting CPPD.
3. Arterial atherosclerosis

## 2018-02-24 MED ORDER — POTASSIUM CHLORIDE CRYS ER 20 MEQ PO TBCR: EXTENDED_RELEASE_TABLET | ORAL | 6 refills | Status: DC

## 2018-02-24 MED ORDER — AMLODIPINE BESYLATE 5 MG PO TABS: 5.0000 mg | ORAL_TABLET | Freq: Every day | ORAL | 1 refills | Status: DC

## 2018-02-24 MED ORDER — NEBIVOLOL HCL 5 MG PO TABS: 2.5000 mg | ORAL_TABLET | Freq: Every day | ORAL | 1 refills | Status: DC

## 2018-02-24 MED ORDER — RIVAROXABAN 20 MG PO TABS: ORAL_TABLET | ORAL | 5 refills | Status: DC

## 2018-02-24 MED ORDER — FUROSEMIDE 40 MG PO TABS: ORAL_TABLET | ORAL | 3 refills | Status: DC

## 2018-03-02 ENCOUNTER — Other Ambulatory Visit (HOSPITAL_COMMUNITY): Payer: Self-pay

## 2018-03-02 MED ORDER — POTASSIUM CHLORIDE CRYS ER 20 MEQ PO TBCR: EXTENDED_RELEASE_TABLET | ORAL | 0 refills | Status: DC

## 2018-03-10 NOTE — Progress Notes (Signed)
Cardiology Office Note Date:  03/11/2018  Patient ID:  Beth Santiago, DOB 1924/04/02, MRN [id number] PCP:  [name], [name], MD  Cardiologist:  Dr. Gala RomneyBensimhon Electrophysiologist: Dr. Johney FrameAllred   Chief Complaint: 6 mo EP f/u  History of Present Illness: Beth Santiago is a 84 y.o. female with history of HTN, HLD, chronic CHF (diastolic), CHB w/PPM, permanent AFib, non-obstructive CAD, hypothyroidism.  She comes in today to be seen for Dr. Johney FrameAllred, last seen by him in May 2019, she was doing well s/p bacteremia, and getting over a URI. He discussed extraction would be high risk given her age and long term suppressive antibiotics would be the better option if indicated.  The pt reported that there had been some mention of that and he urged her to have further conversation with Dr. Ninetta LightsHatcher regarding this.  Recommended 49mo APP f/u. More recently she saw Dr. Gala RomneyBensimhon in October, she was doing well, no changes to her therapy were made.  He recommended SBE prophylaxis with dental work given just a few days prior to her sepsis/bacteremia she had some dental work done.  The patient feels quite well.  She denies any kind of cardiac awareness, no CP, palpitations, no rest SOB, minimal DOE, not very active physically with severe knee pain/problems.  No symptoms of DOE or orthopnea.  She notes some swelling of her lower legs/ankles towards the end of the day, though report her home weights stable, and when she wakes does not appreciate any swelling.  She is complaint with her medicines, no problems with her xarelto, no bleeding or signs of bleeding. They tell me after d/w Dr. Ninetta LightsHatcher and her PMD not to take antibiotics chronically, she has dental prophylaxis available to her for dental work.  Device information: MDT dual chamber PPM, implanted 12/18/03, gen change  12/11/09 March 2019 admitted with sepsis, bacteremia w/enterococcus, negative TEE, treated with 2 weeks IV ampicillin and 2 weeks  amoxicillin.   Past Medical History:  Diagnosis Date  . Atrial flutter (HCC)    a. Noted during pacemaker implantation 2011, spontaneously terminated.  . Biatrial enlargement    severe  . CAD (coronary artery disease)    a. Nonobstructive by cath 2005 - 40-50% mid LAD. b. Nonischemic nuc 05/2011.  . Carotid artery disease (HCC)    a. 1-50% bilateral (upper end) - March 2011.  Marland Kitchen. CHF (congestive heart failure) (HCC)   . DJD (degenerative joint disease)   . GERD (gastroesophageal reflux disease)   . Hemoptysis 05/2016  . Hyperlipidemia   . Hypertension   . Hypothyroid   . Persistent atrial fibrillation (HCC) 05/2011  . Second degree heart block    a. s/p PPM 2005. b. Gen change to [employer] by Dr Johney FrameAllred  9/11 for premature ERI 11/2009.    Past Surgical History:  Procedure Laterality Date  . ABDOMINAL SURGERY     twisted bowel  . CARDIOVERSION  06/11/2011   Procedure: CARDIOVERSION;  Surgeon: Dolores Pattyaniel R Bensimhon, MD;  [facility];  Service: Cardiovascular;  Laterality: N/A;  . CARDIOVERSION N/A 01/04/2013   Procedure: CARDIOVERSION;  Surgeon: Lars MassonKatarina H Nelson, MD;  [facility];  Service: Cardiovascular;  Laterality: N/A;  . COLON SURGERY    . HEMORROIDECTOMY    . PACEMAKER INSERTION  2005, 12/11/09   implanted by Dr Reyes IvanKersey, generator change 9/11 by Fawn KirkJA for premature ERI  . TEE WITHOUT CARDIOVERSION  06/11/2011   Procedure: TRANSESOPHAGEAL ECHOCARDIOGRAM (TEE);  Surgeon: Dolores Pattyaniel R Bensimhon, MD;  [facility];  Service: Cardiovascular;  Laterality: N/A;  . TEE WITHOUT CARDIOVERSION N/A 05/27/2017   Procedure: TRANSESOPHAGEAL ECHOCARDIOGRAM (TEE);  Surgeon: Jake Bathe, MD;  Location[facility] ENDOSCOPY;  Service: Cardiovascular;  Laterality: N/A;  . TONSILLECTOMY      Current Outpatient Medications  Medication Sig Dispense Refill  . amLODipine (NORVASC) 5 MG tablet Take 1 tablet (5 mg total) by mouth daily. 90 tablet 1  . amoxicillin (AMOXIL) 500 MG tablet  Take 4 tablets (2,000 mg total) by mouth as needed (1 hour prior to dental work). 4 tablet 6  . calcium carbonate (TUMS - DOSED IN MG ELEMENTAL CALCIUM) 500 MG chewable tablet Chew 1 tablet (200 mg of elemental calcium total) by mouth as needed for indigestion or heartburn.    . co-enzyme Q-10 30 MG capsule Take 30 mg by mouth daily.    . fish oil-omega-3 fatty acids 1000 MG capsule Take 2 g daily by mouth.     . furosemide (LASIX) 40 MG tablet TAKE 1 TABLET BY MOUTH DAILY, MAY TAKE AN EXTRA TABLET FOR WEIGHT GAIN OF 3 POUNDS OVERNIGHT OR 5 POUNDS IN A WEEK 40 tablet 3  . levothyroxine (SYNTHROID, LEVOTHROID) 88 MCG tablet Take 88 mcg by mouth daily.     . Misc Natural Products (OSTEO BI-FLEX JOINT SHIELD PO) Take 1 tablet by mouth daily.    . Misc Natural Products (TART CHERRY ADVANCED) CAPS Take 1 capsule by mouth daily.    . Multiple Vitamins-Minerals (CENTRUM SILVER PO) Take 1 tablet by mouth daily.      . nebivolol (BYSTOLIC) 5 MG tablet Take 0.5 tablets (2.5 mg total) by mouth daily. 45 tablet 1  . omeprazole (PRILOSEC) 20 MG capsule Take 20 mg by mouth 2 (two) times daily before a meal. Take once or twice a day    . potassium chloride SA (K-DUR,KLOR-CON) 20 MEQ tablet TAKE 1 TABLET BY MOUTH DAILY.  TAKE 1 EXTRA TABLET WHEN YOU TAKE EXTRA LASIX. 5 tablet 0  . rivaroxaban (XARELTO) 20 MG TABS tablet TAKE 1 TABLET BY MOUTH DAILY WITH SUPPER 30 tablet 5  . rosuvastatin (CRESTOR) 20 MG tablet Take 10 mg by mouth 3 (three) times a week. Tue, Thur and Sat    . timolol (BETIMOL) 0.5 % ophthalmic solution Place 1 drop into both eyes every morning.     Marland Kitchen TRAMADOL HCL PO Take by mouth as needed.     No current facility-administered medications for this visit.     Allergies:   Patient has no known allergies.   Social History:  The patient  reports that she has never smoked. She has never used smokeless tobacco. She reports current alcohol use. She reports that she does not use drugs.   Family  History:  The patient's family history includes Cancer in her brother and mother; Diabetes in her mother; Heart disease in her brother and father; Hypertension in her mother.  ROS:  Please see the history of present illness.  All other systems are reviewed and otherwise negative.   PHYSICAL EXAM:  VS:  BP 108/68   Pulse 60   Ht 5\' 6"  (1.676 m)   Wt 156 lb (70.8 kg)   BMI 25.18 kg/m  BMI: Body mass index is 25.18 kg/m. Well nourished, well developed, in no acute distress  HEENT: normocephalic, atraumatic  Neck: no JVD, carotid bruits or masses Cardiac:  RRR (paced); no significant murmurs, no rubs, or gallops Lungs:   CTA b/l, no wheezing, rhonchi or rales  Abd: soft, nontender MS: no deformity, age appropriate atrophy Ext: trace edema  Skin: warm and dry, no rash Neuro:  No gross deficits appreciated Psych: euthymic mood, full affect  PPM site is stable, no tethering or discomfort   EKG:  Not done today PPM interrogation done today and reviewed by myself battery and lead measurements are ok, 99.4% VP, no HVR  05/27/17: TEE Study Conclusions - Left ventricle: Systolic function was normal. The estimated   ejection fraction was in the range of 55% to 60%. Wall motion was   normal; there were no regional wall motion abnormalities. - Aortic valve: No evidence of vegetation. There was mild   regurgitation. - Mitral valve: Mildly calcified annulus. Mildly thickened leaflets   . No evidence of vegetation. There was mild regurgitation. - Left atrium: No evidence of thrombus in the atrial cavity or   appendage. No evidence of thrombus in the appendage. - Right ventricle: The cavity size was mildly dilated. Wall   thickness was normal. - Right atrium: The atrium was dilated. - Atrial septum: No defect or patent foramen ovale was identified.   Echo contrast study showed no right-to-left atrial level shunt,   following an increase in RA pressure induced by provocative   maneuvers. -  Tricuspid valve: No evidence of vegetation. There was severe   regurgitation. - Pulmonic valve: No evidence of vegetation. - Line: A venous pacing wire was visualized in the right atrial   cavity and right ventricular cavity, with its tip at the RV apex. Impressions: - No evidence of endocarditis. There was no evidence of a   vegetation.   Recent Labs: 05/25/2017: ALT 19 05/27/2017: Magnesium 1.7 12/29/2017: BUN 7; Creatinine, Ser 0.55; Hemoglobin 13.0; Platelets 231; Potassium 3.5; Sodium 134  No results found for requested labs within last 8760 hours.   CrCl cannot be calculated (Patient's most recent lab result is older than the maximum 21 days allowed.).   Wt Readings from Last 3 Encounters:  03/11/18 156 lb (70.8 kg)  12/29/17 165 lb 9.6 oz (75.1 kg)  08/02/17 169 lb (76.7 kg)     Other studies reviewed: Additional studies/records reviewed today include: summarized above  ASSESSMENT AND PLAN:  1. PPM     Intact function     H/o enterococcus bacteremia after dental work     Recommend prophylaxis going forward  2. Permanent Afib     CHA2DS2Vasc is 4, on Xarelto, appropriately dosed  3. HTN     No changes  4. Chronic CHF (diastolic)     Follows with Dr. Gala RomneyBensimhon     Edema sounds more like dependent edema, likely with her Norvasc, she is recommended to keep her feet up when seated.     Weight here today is lower then her last several by 15lbs, this is likely an error, they report her home weight stable at 164, weighing daily  Disposition: F/u with remote device checks every 3 months, in-clinic with EP in 1 year, sooner fi needed  Current medicines are reviewed at length with the patient today.  The patient did not have any concerns regarding medicines.  Norma FredricksonSigned, Elmire Amrein, PA-C 03/11/2018 3:01 PM     [facility] [address] [place] [address] [phone] (office)  [phone] (fax)

## 2018-03-11 ENCOUNTER — Ambulatory Visit: Payer: Medicare Other | Admitting: Physician Assistant

## 2018-03-11 VITALS — BP 108/68 | HR 60 | Ht 66.0 in | Wt 156.0 lb

## 2018-03-11 DIAGNOSIS — I10 Essential (primary) hypertension: Secondary | ICD-10-CM

## 2018-03-11 DIAGNOSIS — Z950 Presence of cardiac pacemaker: Secondary | ICD-10-CM | POA: Diagnosis not present

## 2018-03-11 DIAGNOSIS — I5032 Chronic diastolic (congestive) heart failure: Secondary | ICD-10-CM

## 2018-03-11 DIAGNOSIS — I4821 Permanent atrial fibrillation: Secondary | ICD-10-CM | POA: Diagnosis not present

## 2018-03-11 NOTE — Patient Instructions (Signed)
Medication Instructions:   Your physician recommends that you continue on your current medications as directed. Please refer to the Current Medication list given to you today.   If you need a refill on your cardiac medications before your next appointment, please call your pharmacy.   Lab work: NONE ORDERED  TODAY  If you have labs (blood work) drawn today and your tests are completely normal, you will receive your results only by: Marland Kitchen. MyChart Message (if you have MyChart) OR . A paper copy in the mail If you have any lab test that is abnormal or we need to change your treatment, we will call you to review the results.  Testing/Procedures: NONE ORDERED  TODAY   Follow-Up: At [facility], you and your health needs are our priority.  As part of our continuing mission to provide you with exceptional heart care, we have created designated Provider Care Teams.  These Care Teams include your primary Cardiologist (physician) and Advanced Practice Providers (APPs -  Physician Assistants and Nurse Practitioners) who all work together to provide you with the care you need, when you need it. You will need a follow up appointment in 1 years.  Please call our office 2 months in advance to schedule this appointment.  You may see  Dr, Johney FrameAllred  or one of the following Advanced Practice Providers on your designated Care Team:   Gypsy BalsamAmber Seiler, NP . Francis Dowseenee Ursuy, PA-C   Remote monitoring is used to monitor your Pacemaker of ICD from home. This monitoring reduces the number of office visits required to check your device to one time per year. It allows us to keep an eye on the functioning of your device to ensure it is working properly. You are scheduled for a device check from home on . 05-16-18 You may send your transmission at any time that day. If you have a wireless device, the transmission will be sent automatically. After your physician reviews your transmission, you will receive a postcard with your next  transmission date.    Any Other Special Instructions Will Be Listed Below (If Applicable).

## 2018-04-08 ENCOUNTER — Other Ambulatory Visit (HOSPITAL_COMMUNITY): Payer: Self-pay

## 2018-04-08 LAB — CUP PACEART REMOTE DEVICE CHECK
Battery Impedance: 1765 Ohm
Battery Remaining Longevity: 43 mo
Battery Voltage: 2.76 V
Brady Statistic RV Percent Paced: 99 %
Date Time Interrogation Session: 20191125211449
Implantable Lead Implant Date: 20050927
Implantable Lead Implant Date: 20050927
Implantable Lead Location: 753859
Implantable Lead Location: 753860
Implantable Pulse Generator Implant Date: 20110921
Lead Channel Impedance Value: 468 Ohm
Lead Channel Impedance Value: 67 Ohm
Lead Channel Pacing Threshold Amplitude: 1.125 V
Lead Channel Pacing Threshold Pulse Width: 0.4 ms
Lead Channel Setting Pacing Amplitude: 2.5 V
Lead Channel Setting Pacing Pulse Width: 0.4 ms
Lead Channel Setting Sensing Sensitivity: 2.8 mV

## 2018-04-08 MED ORDER — RIVAROXABAN 20 MG PO TABS: ORAL_TABLET | ORAL | 5 refills | Status: DC

## 2018-04-22 ENCOUNTER — Other Ambulatory Visit: Payer: Self-pay | Admitting: *Deleted

## 2018-04-22 MED ORDER — NEBIVOLOL HCL 5 MG PO TABS: 2.5000 mg | ORAL_TABLET | Freq: Every day | ORAL | 2 refills | Status: DC

## 2018-05-16 ENCOUNTER — Ambulatory Visit (INDEPENDENT_AMBULATORY_CARE_PROVIDER_SITE_OTHER): Payer: Medicare Other | Admitting: *Deleted

## 2018-05-16 DIAGNOSIS — I442 Atrioventricular block, complete: Secondary | ICD-10-CM | POA: Diagnosis not present

## 2018-05-16 DIAGNOSIS — I495 Sick sinus syndrome: Secondary | ICD-10-CM

## 2018-05-17 LAB — CUP PACEART REMOTE DEVICE CHECK
Battery Impedance: 1971 Ohm
Battery Remaining Longevity: 39 mo
Battery Voltage: 2.75 V
Date Time Interrogation Session: 20200225000214
Implantable Lead Implant Date: 20050927
Implantable Lead Implant Date: 20050927
Implantable Lead Location: 753859
Implantable Lead Location: 753860
Implantable Lead Model: 5076
Implantable Lead Model: 5076
Implantable Pulse Generator Implant Date: 20110921
Lead Channel Impedance Value: 517 Ohm
Lead Channel Impedance Value: 67 Ohm
Lead Channel Pacing Threshold Amplitude: 1.25 V
Lead Channel Pacing Threshold Pulse Width: 0.4 ms
Lead Channel Setting Pacing Amplitude: 2.5 V
Lead Channel Setting Sensing Sensitivity: 2.8 mV
MDC IDC SET LEADCHNL RV PACING PULSEWIDTH: 0.4 ms
MDC IDC STAT BRADY RV PERCENT PACED: 100 %

## 2018-05-24 NOTE — Progress Notes (Signed)
Remote pacemaker transmission.   

## 2018-05-26 ENCOUNTER — Other Ambulatory Visit (HOSPITAL_COMMUNITY): Payer: Self-pay | Admitting: Internal Medicine

## 2018-07-27 ENCOUNTER — Other Ambulatory Visit: Payer: Self-pay | Admitting: Internal Medicine

## 2018-07-27 ENCOUNTER — Other Ambulatory Visit (HOSPITAL_COMMUNITY): Payer: Self-pay | Admitting: Internal Medicine

## 2018-08-05 IMAGING — US US RENAL
1 series · 14 of 25 positions shown · non-contrast
Comparison: None

CLINICAL DATA: Urinary tract infection, history hypertension

EXAM:
RENAL / URINARY TRACT ULTRASOUND COMPLETE

[Series 1: us renal · 0.25mm/px · 14 of 39 slices shown]
[im 1/39]
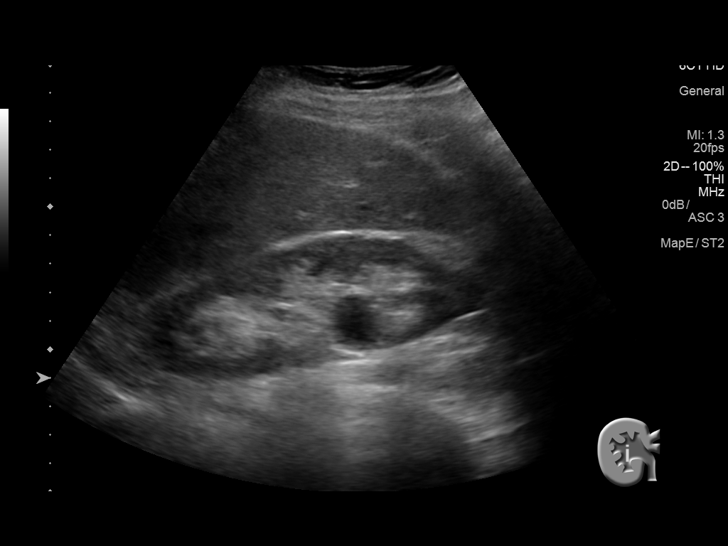
[im 4/39]
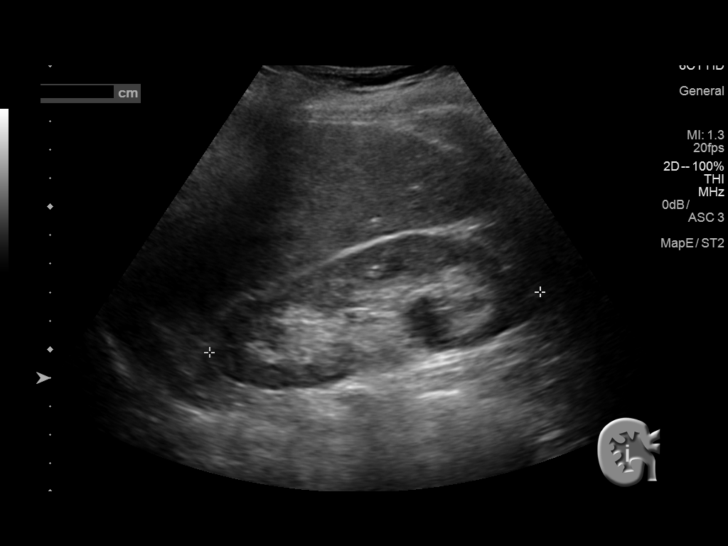
[im 7/39]
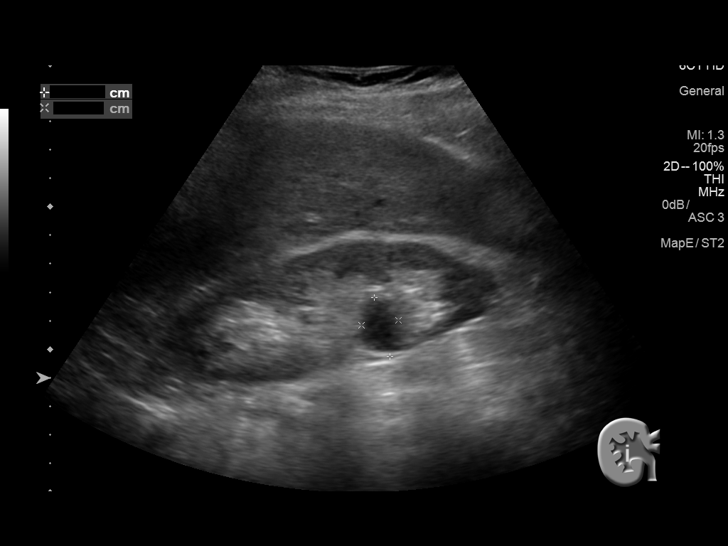
[im 10/39]
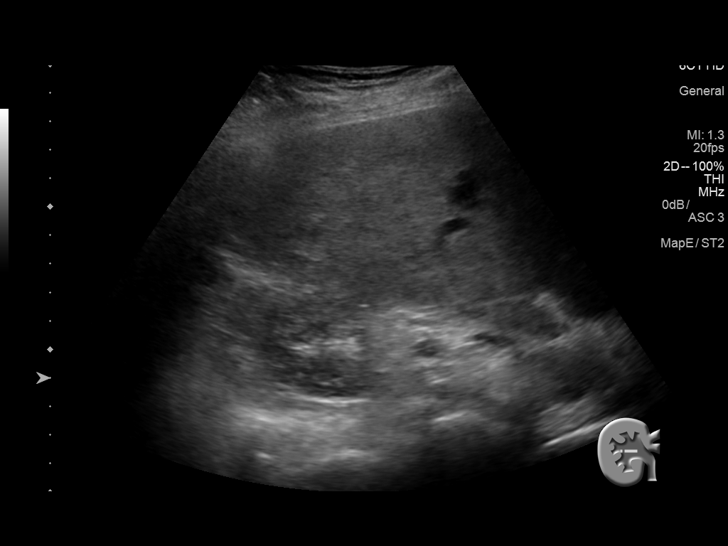
[im 13/39]
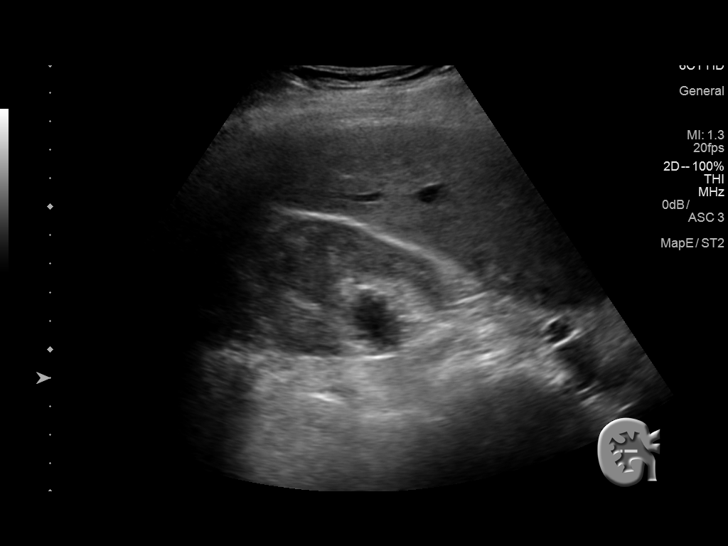
[im 15/39]
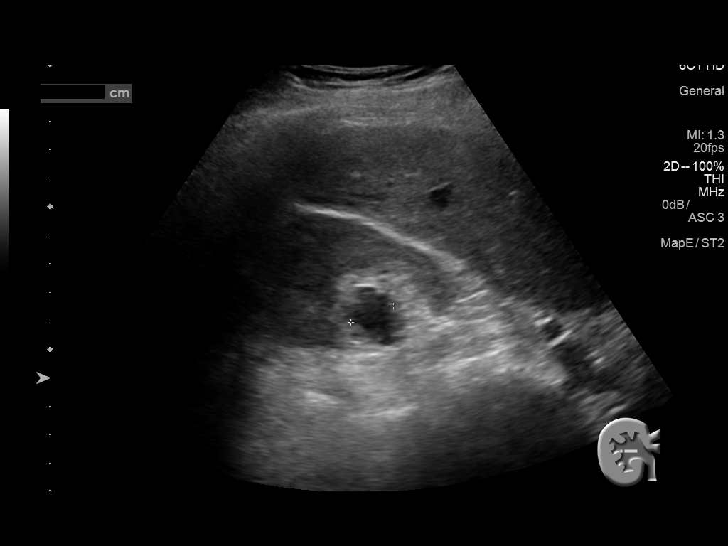
[im 18/39]
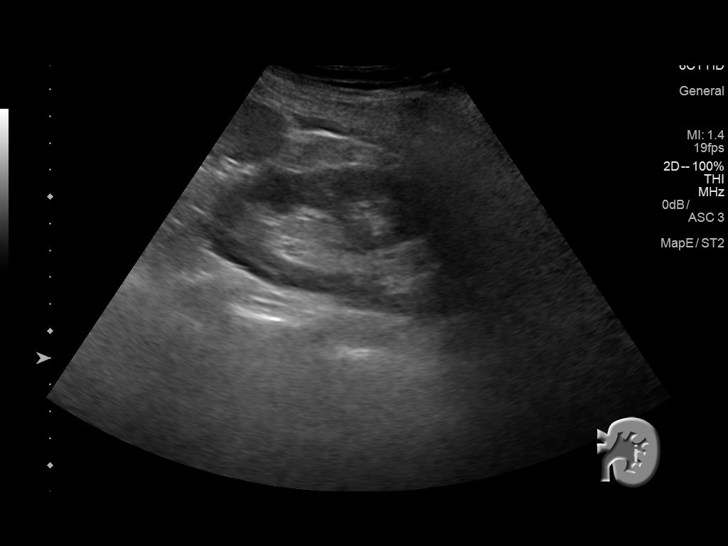
[im 21/39]
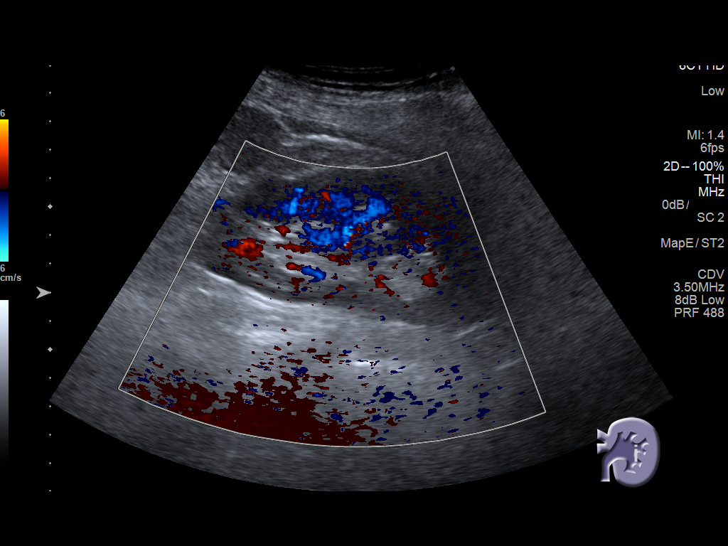
[im 24/39]
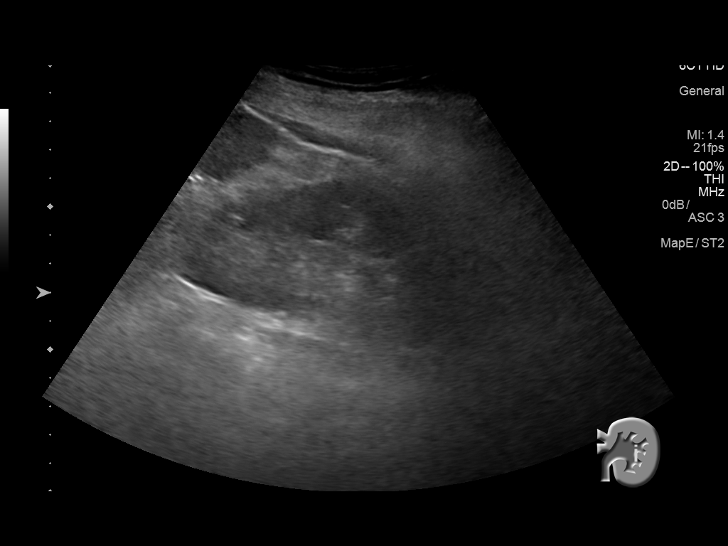
[im 26/39]
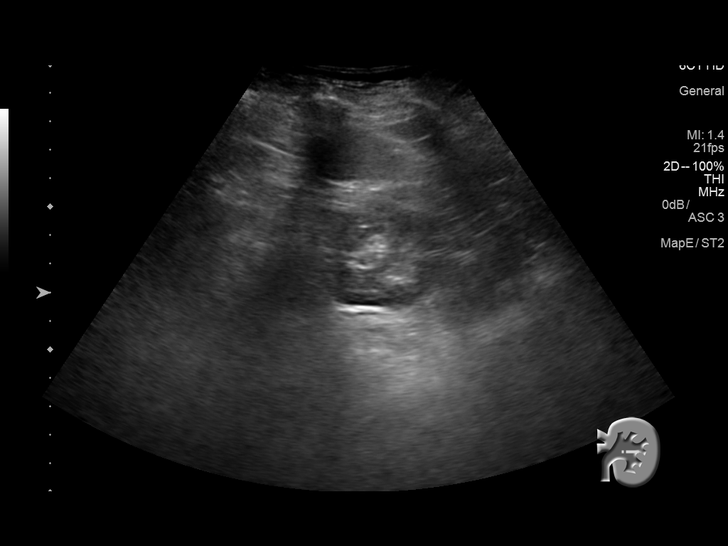
[im 29/39]
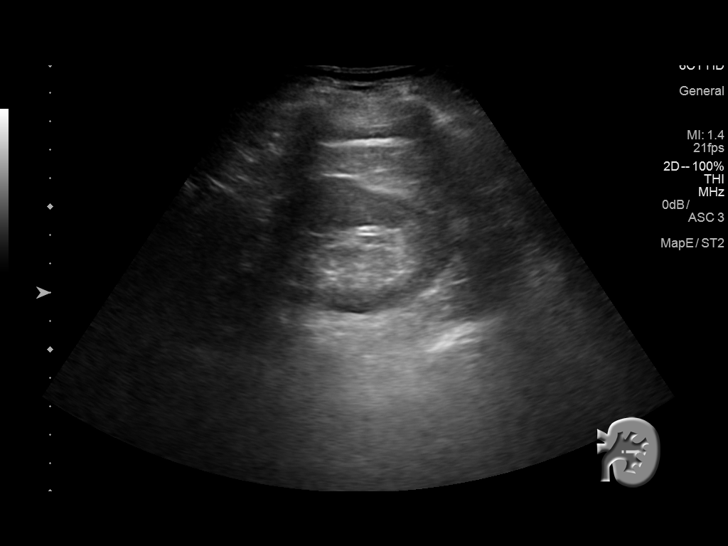
[im 32/39]
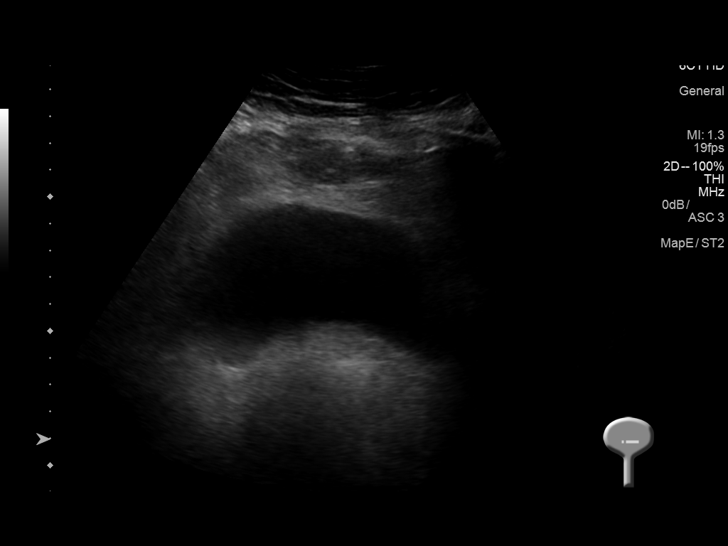
[im 35/39]
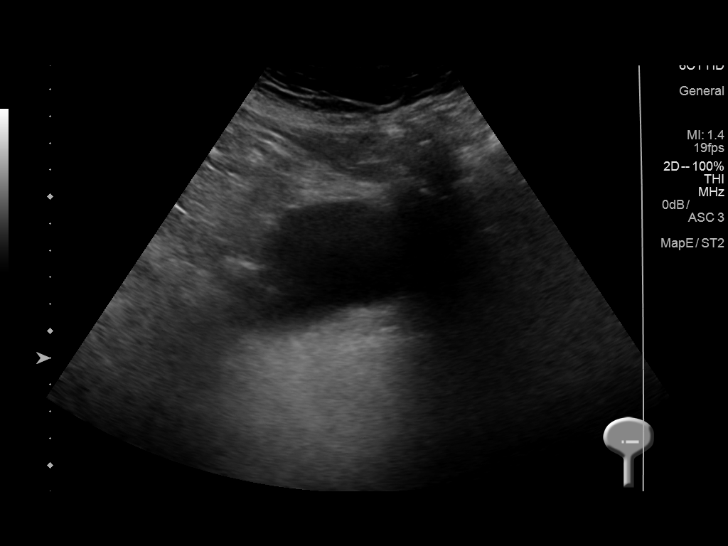
[im 39/39]
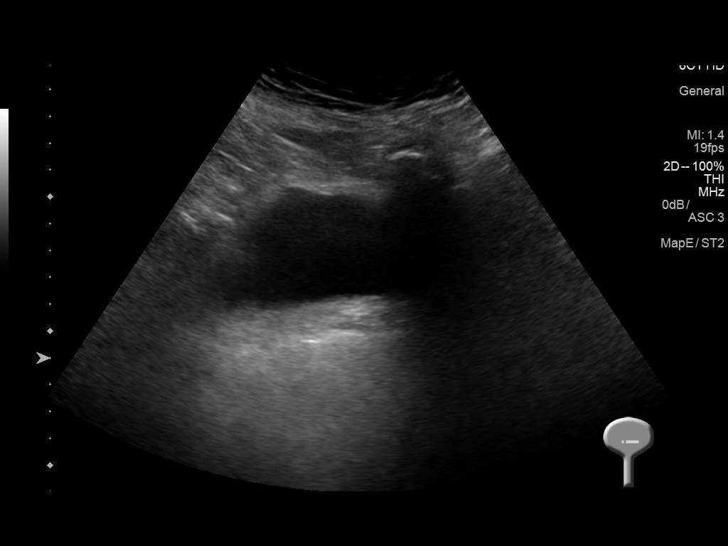

[14 of 25 positions shown; findings below may reference images not displayed]

FINDINGS: Right Kidney:

Length: 11.8 cm. Normal cortical thickness and echogenicity for age.
Small peripelvic renal cyst 2.1 x 1.3 x 1.6 cm, mildly complicated
by scattered internal echoes. No additional mass, hydronephrosis or
shadowing calcification.

Left Kidney:

Length: 11.3 cm. Normal cortical thickness and echogenicity. No
mass, hydronephrosis or shadowing calcification.

Bladder:

Normal appearance
IMPRESSION: Mildly complicated RIGHT renal cyst 2.1 cm in greatest diameter.

Otherwise negative exam.

## 2018-08-16 ENCOUNTER — Ambulatory Visit (INDEPENDENT_AMBULATORY_CARE_PROVIDER_SITE_OTHER): Payer: Medicare Other | Admitting: *Deleted

## 2018-08-16 DIAGNOSIS — I442 Atrioventricular block, complete: Secondary | ICD-10-CM | POA: Diagnosis not present

## 2018-08-16 DIAGNOSIS — I495 Sick sinus syndrome: Secondary | ICD-10-CM

## 2018-08-17 ENCOUNTER — Telehealth: Payer: Self-pay

## 2018-08-17 NOTE — Telephone Encounter (Signed)
Left message for patient to remind of missed remote transmission.  

## 2018-08-18 LAB — CUP PACEART REMOTE DEVICE CHECK
Battery Impedance: 2062 Ohm
Battery Remaining Longevity: 37 mo
Battery Voltage: 2.75 V
Brady Statistic RV Percent Paced: 100 %
Date Time Interrogation Session: 20200528000815
Implantable Lead Implant Date: 20050927
Implantable Lead Implant Date: 20050927
Implantable Lead Location: 753859
Implantable Lead Location: 753860
Implantable Lead Model: 5076
Implantable Lead Model: 5076
Implantable Pulse Generator Implant Date: 20110921
Lead Channel Impedance Value: 493 Ohm
Lead Channel Pacing Threshold Amplitude: 1 V
Lead Channel Pacing Threshold Pulse Width: 0.4 ms
Lead Channel Setting Pacing Amplitude: 2.5 V
Lead Channel Setting Pacing Pulse Width: 0.4 ms
Lead Channel Setting Sensing Sensitivity: 2.8 mV

## 2018-08-26 NOTE — Progress Notes (Signed)
Remote pacemaker transmission.   

## 2018-09-09 ENCOUNTER — Telehealth (HOSPITAL_COMMUNITY): Payer: Self-pay

## 2018-09-09 NOTE — Telephone Encounter (Signed)
The pt is having a tooth extracted and would like to know if she should hold her Xarelto and for how long?

## 2018-09-10 NOTE — Telephone Encounter (Signed)
Would hold for one day. Then resume.

## 2018-09-12 ENCOUNTER — Other Ambulatory Visit (HOSPITAL_COMMUNITY): Payer: Self-pay | Admitting: Internal Medicine

## 2018-09-13 NOTE — Telephone Encounter (Signed)
Spoke the pt's daughter [name] and relayed message about Xarelto. Pt's daughter verbalized understanding and is agreeable. No further needs at this time.

## 2018-09-28 ENCOUNTER — Other Ambulatory Visit: Payer: Self-pay | Admitting: Internal Medicine

## 2018-10-07 ENCOUNTER — Other Ambulatory Visit (HOSPITAL_COMMUNITY): Payer: Self-pay | Admitting: Internal Medicine

## 2018-10-31 ENCOUNTER — Telehealth (HOSPITAL_COMMUNITY): Payer: Self-pay | Admitting: *Deleted

## 2018-10-31 NOTE — Telephone Encounter (Signed)
Pts daughter left VM stating pts bp was 143/77 and shes had to give her extra lasix and k recently since her weight has slowly increased over the past few weeks. Pt is lethargic and has some confusion. She also mentioned patient was forgetful. Mary requested a call from a provider to discuss patients overall health/plan.  Routed to [place]

## 2018-10-31 NOTE — Telephone Encounter (Signed)
Lets schedule a virtual visit some time later this week.

## 2018-10-31 NOTE — Telephone Encounter (Signed)
appt scheduled pts daughter aware.

## 2018-11-02 ENCOUNTER — Other Ambulatory Visit: Payer: Self-pay

## 2018-11-02 ENCOUNTER — Ambulatory Visit (HOSPITAL_COMMUNITY)
Admission: RE | Admit: 2018-11-02 | Discharge: 2018-11-02 | Disposition: A | Discharge status: Home / Self Care | Payer: Medicare Other | Source: Ambulatory Visit | Attending: Internal Medicine | Admitting: Internal Medicine

## 2018-11-02 DIAGNOSIS — I4821 Permanent atrial fibrillation: Secondary | ICD-10-CM

## 2018-11-02 DIAGNOSIS — I5032 Chronic diastolic (congestive) heart failure: Secondary | ICD-10-CM | POA: Diagnosis not present

## 2018-11-02 NOTE — Addendum Note (Signed)
Encounter addended by: Valeda Malm, RN on: 11/02/2018 11:53 AM  Actions taken: Clinical Note Signed

## 2018-11-02 NOTE — Progress Notes (Addendum)
Spoke with patients daughter, No changes or tests ordered. Pt to f/u in September.  AVS mailed to patient.

## 2018-11-02 NOTE — Patient Instructions (Addendum)
No medication changes today!!  Your physician recommends that you schedule a follow-up appointment in: Keep appointment in September. You are scheduled for Monday, September 21st, 2020 at 2:40p.    Please call office if you have any questions or concerns [phone] option 2 for nurses.  At the [facility], you and your health needs are our priority. As part of our continuing mission to provide you with exceptional heart care, we have created designated Provider Care Teams. These Care Teams include your primary Cardiologist (physician) and Advanced Practice Providers (APPs- Physician Assistants and Nurse Practitioners) who all work together to provide you with the care you need, when you need it.   You may see any of the following providers on your designated Care Team at your next follow up: Marland Kitchen Dr Glori Bickers . Dr Loralie Champagne . Darrick Grinder, NP   Please be sure to bring in all your medications bottles to every appointment.

## 2018-11-02 NOTE — Progress Notes (Signed)
Heart Failure TeleHealth Note  Due to national recommendations of social distancing due to [place] 19, Audio/video telehealth visit is felt to be most appropriate for this patient at this time.  See MyChart message from today for patient consent regarding telehealth for [facility].  Date:  11/02/2018   ID:  Beth Santiago, DOB 1925-03-17, MRN [id number]  Location: Home  Provider location: [place] Type of Visit: Established patient  PCP:  Reynold Bowen, MD  Cardiologist:  Glori Bickers, MD Primary HF: Bensimhon  Chief Complaint: Heart Failure follow-up   History of Present Illness:  Beth Santiago is an 85 year old with a history of , HTN, hyperlipidemia, permanent a fib, CHB PPM, nonobstructive CAD cath 2007, and chronic diastolic heart failure. Follows with Dr. Rayann Heman for her PPM   Admitted the March 2018 with R knee pain and dyspnea. Treated for severe pneumonia and heart failure. Diuresed with IV lasix and transitioned to po lasix 40 mg daily. Discharge weight was 181 pounds. Discharged to [facility].   Admitted 05/24/17 with sepsis. Bcx + enterococcus. TEE 05/27/17  Normal LVEF Severe TR. No vegetations on valves or pacer. Had dentist appointment 3 days before. Had PICC line and plan for IV ampicillin for 2 weeks and then 2 weeks amoxicillin.   She presents via Engineer, civil (consulting) for a telehealth visit today.  She is accompanied by her daughter on the phone. Beth Santiago says she feels fine. Denies SOB, orthopnea or PND. Says her weight has been going up over the last few weeks from 158 to 165. She gave her a few extra doses of lasix with weight down to 163. On Sunday she had some confusion and BP 123/50. She gave her some water and BP 121/75. Ankles swelling at night but fine in morning. No orthopnea or PND. Activity level normal. No f/c.  TEE 3/19 EF 55-60% ECHO 06/19/2016: EF 60-65%.    Beth Santiago denies symptoms  worrisome for COVID 19.   Past Medical History:  Diagnosis Date  . Atrial flutter ([place])    a. Noted during pacemaker implantation 2011, spontaneously terminated.  . Biatrial enlargement    severe  . CAD (coronary artery disease)    a. Nonobstructive by cath 2005 - 40-50% mid LAD. b. Nonischemic nuc 05/2011.  . Carotid artery disease ([place])    a. 1-50% bilateral (upper end) - March 2011.  Marland Kitchen CHF (congestive heart failure) ([place])   . DJD (degenerative joint disease)   . GERD (gastroesophageal reflux disease)   . Hemoptysis 05/2016  . Hyperlipidemia   . Hypertension   . Hypothyroid   . Persistent atrial fibrillation ([place]) 05/2011  . Second degree heart block    a. s/p PPM 2005. b. Gen change to Medtronic by Dr Rayann Heman  9/11 for premature ERI 11/2009.   Past Surgical History:  Procedure Laterality Date  . ABDOMINAL SURGERY     twisted bowel  . CARDIOVERSION  06/11/2011   Procedure: CARDIOVERSION;  Surgeon: Jolaine Artist, MD;  Location: [facility] ENDOSCOPY;  Service: Cardiovascular;  Laterality: N/A;  . CARDIOVERSION N/A 01/04/2013   Procedure: CARDIOVERSION;  Surgeon: Dorothy Spark, MD;  Location: [place];  Service: Cardiovascular;  Laterality: N/A;  . COLON SURGERY    . HEMORROIDECTOMY    . PACEMAKER INSERTION  2005, 12/11/09   implanted by Dr Verlon Setting, generator change 9/11 by Greggory Brandy for premature ERI  . TEE WITHOUT CARDIOVERSION  06/11/2011   Procedure: TRANSESOPHAGEAL  ECHOCARDIOGRAM (TEE);  Surgeon: Dolores Pattyaniel R Bensimhon, MD;  [facility];  Service: Cardiovascular;  Laterality: N/A;  . TEE WITHOUT CARDIOVERSION N/A 05/27/2017   Procedure: TRANSESOPHAGEAL ECHOCARDIOGRAM (TEE);  Surgeon: Jake BatheSkains, Mark C, MD;  [facility];  Service: Cardiovascular;  Laterality: N/A;  . TONSILLECTOMY       Current Outpatient Medications  Medication Sig Dispense Refill  . amLODipine (NORVASC) 5 MG tablet TAKE 1 TABLET BY MOUTH  DAILY. 90 tablet 0  . amoxicillin (AMOXIL) 500 MG capsule  TAKE 4 CAPSULES BY MOUTH AS NEEDED (1 HOUR PRIOR TO DENTAL WORK) 4 capsule 0  . calcium carbonate (TUMS - DOSED IN MG ELEMENTAL CALCIUM) 500 MG chewable tablet Chew 1 tablet (200 mg of elemental calcium total) by mouth as needed for indigestion or heartburn.    . co-enzyme Q-10 30 MG capsule Take 30 mg by mouth daily.    . fish oil-omega-3 fatty acids 1000 MG capsule Take 2 g daily by mouth.     . furosemide (LASIX) 40 MG tablet TAKE 1 TABLET BY MOUTH  DAILY, MAY TAKE AN EXTRA  TABLET FOR WEIGHT GAIN OF 3 POUNDS OVERNIGHT OR 5  POUNDS IN A WEEK 90 tablet 1  . levothyroxine (SYNTHROID, LEVOTHROID) 88 MCG tablet Take 88 mcg by mouth daily.     . Misc Natural Products (OSTEO BI-FLEX JOINT SHIELD PO) Take 1 tablet by mouth daily.    . Misc Natural Products (TART CHERRY ADVANCED) CAPS Take 1 capsule by mouth daily.    . Multiple Vitamins-Minerals (CENTRUM SILVER PO) Take 1 tablet by mouth daily.      . nebivolol (BYSTOLIC) 5 MG tablet Take 0.5 tablets (2.5 mg total) by mouth daily. 45 tablet 2  . omeprazole (PRILOSEC) 20 MG capsule Take 20 mg by mouth 2 (two) times daily before a meal. Take once or twice a day    . potassium chloride SA (K-DUR,KLOR-CON) 20 MEQ tablet TAKE 1 TABLET BY MOUTH DAILY.  TAKE 1 EXTRA TABLET WHEN YOU TAKE EXTRA LASIX. 5 tablet 0  . rosuvastatin (CRESTOR) 20 MG tablet Take 10 mg by mouth 3 (three) times a week. Tue, Thur and Sat    . timolol (BETIMOL) 0.5 % ophthalmic solution Place 1 drop into both eyes every morning.     Marland Kitchen. TRAMADOL HCL PO Take by mouth as needed.    Carlena Hurl. XARELTO 20 MG TABS tablet TAKE 1 TABLET BY MOUTH DAILY WITH SUPPER 30 tablet 5   No current facility-administered medications for this encounter.     Allergies:   Patient has no known allergies.   Social History:  The patient  reports that she has never smoked. She has never used smokeless tobacco. She reports current alcohol use. She reports that she does not use drugs.   Family History:  The patient's  family history includes Cancer in her brother and mother; Diabetes in her mother; Heart disease in her brother and father; Hypertension in her mother.   ROS:  Please see the history of present illness.   All other systems are personally reviewed and negative.   Exam:  (Video/Tele Health Call; Exam is subjective and or/visual.) General:  Speaks in full sentences. No resp difficulty. Lungs: Normal respiratory effort with conversation.  Abdomen: Non-distended per patient report Extremities: Pt denies edema. Neuro: Alert & oriented x 3.   Recent Labs: 12/29/2017: BUN 7; Creatinine, Ser 0.55; Hemoglobin 13.0; Platelets 231; Potassium 3.5; Sodium 134  Personally reviewed   Wt Readings from  Last 3 Encounters:  03/11/18 70.8 kg (156 lb)  12/29/17 75.1 kg (165 lb 9.6 oz)  08/02/17 76.7 kg (169 lb)      ASSESSMENT AND PLAN:  1. Chronic Diastolic Heart Failure - ECHO 1/61093/2019 EF 55-60%.  - Weight mildly elevated but I am not clear that she is volume overloaded as she does not have symptoms of overt HF.  -Continue lasix 40mg  daily. Can take extra dose with kcl as needed - If weight continues to climb or develops HF symptoms will need acute in-person visit.  - F/u in September as scheduled - She has seen PCP and has labs pending  2. Permanent A fib-  - Rate controlled. On Xarelto. No bleeding  - PPM followed by Dr. Johney FrameAllred  3. HTN - Blood pressure a bit labile but well controlled. Continue current regimen.  4. H/o enterococcal sepsis 3/19 - Given proximity to dental procedure would use SBE prophylaxis for all subsequent dental proceudres  COVID screen The patient does not have any symptoms that suggest any further testing/ screening at this time.  Social distancing reinforced today.  Recommended follow-up:  As above  Relevant cardiac medications were reviewed at length with the patient today.   The patient does not have concerns regarding their medications at this time.   The  following changes were made today:  As above  Today, I have spent 17 minutes with the patient with telehealth technology discussing the above issues .    Signed, Arvilla Meresaniel Bensimhon, MD  11/02/2018 9:44 AM  [facility] [address] [facility] [place] [id number] [phone] (office) [phone] (fax)

## 2018-11-02 NOTE — Addendum Note (Signed)
Encounter addended by: Valeda Malm, RN on: 11/02/2018 12:21 PM  Actions taken: Clinical Note Signed

## 2018-11-07 ENCOUNTER — Other Ambulatory Visit (HOSPITAL_COMMUNITY): Payer: Self-pay | Admitting: Internal Medicine

## 2018-11-11 ENCOUNTER — Encounter: Payer: Self-pay | Admitting: Internal Medicine

## 2018-11-16 ENCOUNTER — Ambulatory Visit (INDEPENDENT_AMBULATORY_CARE_PROVIDER_SITE_OTHER): Payer: Medicare Other | Admitting: *Deleted

## 2018-11-16 ENCOUNTER — Telehealth (HOSPITAL_COMMUNITY): Payer: Self-pay

## 2018-11-16 DIAGNOSIS — I495 Sick sinus syndrome: Secondary | ICD-10-CM

## 2018-11-16 NOTE — Telephone Encounter (Signed)
Daughter called and left message on triage line about wanting an appointment with DB.  Called back to obtain more details however there was no answer and phone hung up.

## 2018-11-17 NOTE — Telephone Encounter (Signed)
Pt daughter left VM to report pt is at goal weight and seems to be doing better she's not sure if patient still requires an office visit. Note,labs, and chest x ray from Dr.South were faxed to our office. She asks that Dr.Bensimhon review them and let her know if patient needs to be seen in office.

## 2018-11-18 LAB — CUP PACEART REMOTE DEVICE CHECK
Battery Impedance: 2059 Ohm
Battery Remaining Longevity: 38 mo
Battery Voltage: 2.75 V
Brady Statistic RV Percent Paced: 100 %
Date Time Interrogation Session: 20200827214841
Implantable Lead Implant Date: 20050927
Implantable Lead Implant Date: 20050927
Implantable Lead Location: 753859
Implantable Lead Location: 753860
Implantable Lead Model: 5076
Implantable Lead Model: 5076
Implantable Pulse Generator Implant Date: 20110921
Lead Channel Impedance Value: 578 Ohm
Lead Channel Impedance Value: 67 Ohm
Lead Channel Pacing Threshold Amplitude: 1.125 V
Lead Channel Pacing Threshold Pulse Width: 0.4 ms
Lead Channel Setting Pacing Amplitude: 2.5 V
Lead Channel Setting Pacing Pulse Width: 0.4 ms
Lead Channel Setting Sensing Sensitivity: 2.8 mV

## 2018-11-23 ENCOUNTER — Encounter (HOSPITAL_COMMUNITY): Payer: Medicare Other | Admitting: Internal Medicine

## 2018-11-24 ENCOUNTER — Encounter: Payer: Self-pay | Admitting: Cardiology

## 2018-11-24 NOTE — Progress Notes (Signed)
Remote pacemaker transmission.   

## 2018-12-12 ENCOUNTER — Encounter (HOSPITAL_COMMUNITY): Payer: Self-pay | Admitting: Internal Medicine

## 2018-12-12 ENCOUNTER — Ambulatory Visit (HOSPITAL_COMMUNITY)
Admission: RE | Admit: 2018-12-12 | Discharge: 2018-12-12 | Disposition: A | Discharge status: Home / Self Care | Payer: Medicare Other | Source: Ambulatory Visit | Attending: Internal Medicine | Admitting: Internal Medicine

## 2018-12-12 ENCOUNTER — Other Ambulatory Visit: Payer: Self-pay

## 2018-12-12 VITALS — BP 110/62 | HR 63 | Wt 152.8 lb

## 2018-12-12 DIAGNOSIS — Z8249 Family history of ischemic heart disease and other diseases of the circulatory system: Secondary | ICD-10-CM | POA: Diagnosis not present

## 2018-12-12 DIAGNOSIS — Z7901 Long term (current) use of anticoagulants: Secondary | ICD-10-CM | POA: Insufficient documentation

## 2018-12-12 DIAGNOSIS — R7881 Bacteremia: Secondary | ICD-10-CM | POA: Diagnosis not present

## 2018-12-12 DIAGNOSIS — Z792 Long term (current) use of antibiotics: Secondary | ICD-10-CM | POA: Diagnosis not present

## 2018-12-12 DIAGNOSIS — A4181 Sepsis due to Enterococcus: Secondary | ICD-10-CM | POA: Insufficient documentation

## 2018-12-12 DIAGNOSIS — I441 Atrioventricular block, second degree: Secondary | ICD-10-CM | POA: Insufficient documentation

## 2018-12-12 DIAGNOSIS — Z809 Family history of malignant neoplasm, unspecified: Secondary | ICD-10-CM | POA: Diagnosis not present

## 2018-12-12 DIAGNOSIS — I071 Rheumatic tricuspid insufficiency: Secondary | ICD-10-CM | POA: Diagnosis not present

## 2018-12-12 DIAGNOSIS — Z950 Presence of cardiac pacemaker: Secondary | ICD-10-CM | POA: Insufficient documentation

## 2018-12-12 DIAGNOSIS — I4821 Permanent atrial fibrillation: Secondary | ICD-10-CM | POA: Insufficient documentation

## 2018-12-12 DIAGNOSIS — B952 Enterococcus as the cause of diseases classified elsewhere: Secondary | ICD-10-CM

## 2018-12-12 DIAGNOSIS — I5032 Chronic diastolic (congestive) heart failure: Secondary | ICD-10-CM | POA: Insufficient documentation

## 2018-12-12 DIAGNOSIS — I2510 Atherosclerotic heart disease of native coronary artery without angina pectoris: Secondary | ICD-10-CM | POA: Diagnosis not present

## 2018-12-12 DIAGNOSIS — Z791 Long term (current) use of non-steroidal anti-inflammatories (NSAID): Secondary | ICD-10-CM | POA: Insufficient documentation

## 2018-12-12 DIAGNOSIS — K219 Gastro-esophageal reflux disease without esophagitis: Secondary | ICD-10-CM | POA: Diagnosis not present

## 2018-12-12 DIAGNOSIS — Z79899 Other long term (current) drug therapy: Secondary | ICD-10-CM | POA: Diagnosis not present

## 2018-12-12 DIAGNOSIS — I4892 Unspecified atrial flutter: Secondary | ICD-10-CM | POA: Diagnosis not present

## 2018-12-12 DIAGNOSIS — M1990 Unspecified osteoarthritis, unspecified site: Secondary | ICD-10-CM | POA: Diagnosis not present

## 2018-12-12 DIAGNOSIS — I110 Hypertensive heart disease with heart failure: Secondary | ICD-10-CM | POA: Diagnosis not present

## 2018-12-12 DIAGNOSIS — E039 Hypothyroidism, unspecified: Secondary | ICD-10-CM | POA: Insufficient documentation

## 2018-12-12 DIAGNOSIS — Z833 Family history of diabetes mellitus: Secondary | ICD-10-CM | POA: Insufficient documentation

## 2018-12-12 DIAGNOSIS — E785 Hyperlipidemia, unspecified: Secondary | ICD-10-CM | POA: Diagnosis not present

## 2018-12-12 LAB — BASIC METABOLIC PANEL
Anion gap: 8 (ref 5–15)
BUN: 11 mg/dL (ref 8–23)
CO2: 25 mmol/L (ref 22–32)
Calcium: 9.7 mg/dL (ref 8.9–10.3)
Chloride: 100 mmol/L (ref 98–111)
Creatinine, Ser: 0.59 mg/dL (ref 0.44–1.00)
GFR calc Af Amer: >60 mL/min (ref >60)
GFR calc non Af Amer: >60 mL/min (ref >60)
Glucose, Bld: 174 mg/dL — ABNORMAL HIGH (ref 70–99)
Potassium: 4.6 mmol/L (ref 3.5–5.1)
Sodium: 133 mmol/L — ABNORMAL LOW (ref 135–145)

## 2018-12-12 LAB — BRAIN NATRIURETIC PEPTIDE: B Natriuretic Peptide: 790.1 pg/mL — ABNORMAL HIGH (ref 0.0–100.0)

## 2018-12-12 NOTE — Patient Instructions (Addendum)
Labs today We will only contact you if something comes back abnormal or we need to make some changes. Otherwise no news is good news!  Your physician has requested that you have an echocardiogram. Echocardiography is a painless test that uses sound waves to create images of your heart. It provides your doctor with information about the size and shape of your heart and how well your heart's chambers and valves are working. This procedure takes approximately one hour. There are no restrictions for this procedure.  Your physician recommends that you schedule a follow-up appointment in: 3-4 months with Dr Haroldine Laws and an ECHO. Our office will call you to schedule this appointment   At the [facility], you and your health needs are our priority. As part of our continuing mission to provide you with exceptional heart care, we have created designated Provider Care Teams. These Care Teams include your primary Cardiologist (physician) and Advanced Practice Providers (APPs- Physician Assistants and Nurse Practitioners) who all work together to provide you with the care you need, when you need it.   You may see any of the following providers on your designated Care Team at your next follow up: Marland Kitchen Dr Glori Bickers . Dr Loralie Champagne . Darrick Grinder, NP   Please be sure to bring in all your medications bottles to every appointment.

## 2018-12-12 NOTE — Progress Notes (Signed)
Heart Failure Clinic Note    Date:  12/12/2018   ID:  Jakylah Bassinger, DOB 1924-09-05, MRN [id number]  Location: Home  Provider location: [place] Type of Visit: Established patient  PCP:  Adrian Prince, MD  Cardiologist:  Arvilla Meres, MD Primary HF: Delayne Sanzo  Chief Complaint: Heart Failure follow-up   History of Present Illness:  Ms Akamine is an 85 year old with a history of , HTN, hyperlipidemia, permanent a fib, CHB PPM, nonobstructive CAD cath 2007, and chronic diastolic heart failure. Follows with Dr. Johney Frame for her PPM   Admitted the March 2018 with R knee pain and dyspnea. Treated for severe pneumonia and heart failure. Diuresed with IV lasix and transitioned to po lasix 40 mg daily. Discharge weight was 181 pounds. Discharged to [facility].   Admitted 05/24/17 with sepsis. Bcx + enterococcus. TEE 05/27/17  Normal LVEF Severe TR. No vegetations on valves or pacer. Had dentist appointment 3 days before. Had PICC line and plan for IV ampicillin for 2 weeks and then 2 weeks amoxicillin.   She presents for routine f/u today. We had a televisit a few weeks ago and weight was up but I wasn't convinced it was all fluid.   However her weight continued to go up and peaked 169.8. saw Ronney Lion NP. Who restarted lasix 40 daily and gave her 3 doses of metolazone 1.25 with great result. Now on lasix 20 and spiro 12.5. Weight down 20 pounds to 149. Feels much better. No dizziness. Edema resolved.No orthopnea or PND.    She is accompanied by her daughter on the phone. Ms. Caloca says she feels fine. Denies SOB, orthopnea or PND. Says her weight has been going up over the last few weeks from 158 to 165. She gave her a few extra doses of lasix with weight down to 163. On Sunday she had some confusion and BP 123/50. She gave her some water and BP 121/75. Ankles swelling at night but fine in morning. No orthopnea or PND. Activity level  normal. No f/c.  TEE 3/19 EF 55-60% ECHO 06/19/2016: EF 60-65%.    Yovana Scogin denies symptoms worrisome for COVID 19.   Past Medical History:  Diagnosis Date  . Atrial flutter ([facility])    a. Noted during pacemaker implantation 2011, spontaneously terminated.  . Biatrial enlargement    severe  . CAD (coronary artery disease)    a. Nonobstructive by cath 2005 - 40-50% mid LAD. b. Nonischemic nuc 05/2011.  . Carotid artery disease ([facility])    a. 1-50% bilateral (upper end) - March 2011.  Marland Kitchen CHF (congestive heart failure) ([facility])   . DJD (degenerative joint disease)   . GERD (gastroesophageal reflux disease)   . Hemoptysis 05/2016  . Hyperlipidemia   . Hypertension   . Hypothyroid   . Persistent atrial fibrillation ([facility]) 05/2011  . Second degree heart block    a. s/p PPM 2005. b. Gen change to Medtronic by Dr Johney Frame  9/11 for premature ERI 11/2009.   Past Surgical History:  Procedure Laterality Date  . ABDOMINAL SURGERY     twisted bowel  . CARDIOVERSION  06/11/2011   Procedure: CARDIOVERSION;  Surgeon: Dolores Patty, MD;  Location: [facility] ENDOSCOPY;  Service: Cardiovascular;  Laterality: N/A;  . CARDIOVERSION N/A 01/04/2013   Procedure: CARDIOVERSION;  Surgeon: Lars Masson, MD;  Location[facility];  Service: Cardiovascular;  Laterality: N/A;  . COLON SURGERY    . HEMORROIDECTOMY    .  PACEMAKER INSERTION  2005, 12/11/09   implanted by Dr Verlon Setting, generator change 9/11 by Greggory Brandy for premature ERI  . TEE WITHOUT CARDIOVERSION  06/11/2011   Procedure: TRANSESOPHAGEAL ECHOCARDIOGRAM (TEE);  Surgeon: Jolaine Artist, MD;  Location: [facility] ENDOSCOPY;  Service: Cardiovascular;  Laterality: N/A;  . TEE WITHOUT CARDIOVERSION N/A 05/27/2017   Procedure: TRANSESOPHAGEAL ECHOCARDIOGRAM (TEE);  Surgeon: Jerline Pain, MD;  Location: [facility] ENDOSCOPY;  Service: Cardiovascular;  Laterality: N/A;  . TONSILLECTOMY       Current Outpatient Medications  Medication Sig Dispense Refill  .  amLODipine (NORVASC) 5 MG tablet Only take if Systolic BP is over 423    . amoxicillin (AMOXIL) 500 MG capsule TAKE 4 CAPSULES BY MOUTH AS NEEDED (1 HOUR PRIOR TO DENTAL WORK) 4 capsule 0  . calcium carbonate (TUMS - DOSED IN MG ELEMENTAL CALCIUM) 500 MG chewable tablet Chew 1 tablet (200 mg of elemental calcium total) by mouth as needed for indigestion or heartburn.    . co-enzyme Q-10 30 MG capsule Take 30 mg by mouth daily.    . diclofenac sodium (VOLTAREN) 1 % GEL Voltaren 1 % topical gel  APPLY 4 GRAMS TO THE AFFECTED AREA(S) BY TOPICAL ROUTE 4 TIMES PER DAY    . fish oil-omega-3 fatty acids 1000 MG capsule Take 2 g daily by mouth.     . furosemide (LASIX) 40 MG tablet Take 1/2 tab by mouth daily, make take an extra tablet for weight gain of 3 pounds overnight or 5 pounds in a week    . ketoconazole (NIZORAL) 2 % cream     . levothyroxine (SYNTHROID, LEVOTHROID) 88 MCG tablet Take 88 mcg by mouth daily. On Tuesday 1/2 pill    . Misc Natural Products (OSTEO BI-FLEX JOINT SHIELD PO) Take 1 tablet by mouth daily.    . Multiple Vitamins-Minerals (CENTRUM SILVER PO) Take 1 tablet by mouth daily.      . nebivolol (BYSTOLIC) 5 MG tablet Take 0.5 tablets (2.5 mg total) by mouth daily. 45 tablet 2  . omeprazole (PRILOSEC) 20 MG capsule Take 20 mg by mouth 2 (two) times daily before a meal. Take once or twice a day    . potassium chloride SA (K-DUR) 20 MEQ tablet TAKE 1 TABLET BY MOUTH  DAILY AND TAKE 1 EXTRA  TABLET WHEN YOU TAKE EXTRA  LASIX 40 tablet 11  . rosuvastatin (CRESTOR) 20 MG tablet Take 10 mg by mouth 3 (three) times a week. Tue, Thur and Sat    . spironolactone (ALDACTONE) 25 MG tablet Take 12.5 mg by mouth daily.    . timolol (BETIMOL) 0.5 % ophthalmic solution Place 1 drop into both eyes every morning.     Marland Kitchen TRAMADOL HCL PO Take by mouth as needed.    . valACYclovir (VALTREX) 1000 MG tablet Take 1,000 mg by mouth. Take 1 tab mouth 3 times daily for 7 days    . XARELTO 20 MG TABS tablet  TAKE 1 TABLET BY MOUTH DAILY WITH SUPPER 30 tablet 5   No current facility-administered medications for this encounter.     Allergies:   Patient has no known allergies.   Social History:  The patient  reports that she has never smoked. She has never used smokeless tobacco. She reports current alcohol use. She reports that she does not use drugs.   Family History:  The patient's family history includes Cancer in her brother and mother; Diabetes in her mother; Heart disease in her brother and father;  Hypertension in her mother.   ROS:  Please see the history of present illness.   All other systems are personally reviewed and negative.   Exam:  General:  Elderly Well appearing. No resp difficulty HEENT: normal Neck: supple.Mild JVP with marked v-waves  Carotids 2+ bilat; no bruits. No lymphadenopathy or thryomegaly appreciated. Cor: PMI nondisplaced. Irregular rate & rhythm 2/6 TR  Lungs: clear Abdomen: soft, nontender, nondistended. No hepatosplenomegaly. No bruits or masses. Good bowel sounds. Extremities: no cyanosis, clubbing, rash, edema Neuro: alert & orientedx3, cranial nerves grossly intact. moves all 4 extremities w/o difficulty. Affect pleasant   Recent Labs: 12/29/2017: BUN 7; Creatinine, Ser 0.55; Hemoglobin 13.0; Platelets 231; Potassium 3.5; Sodium 134  Personally reviewed   Wt Readings from Last 3 Encounters:  03/11/18 70.8 kg (156 lb)  12/29/17 75.1 kg (165 lb 9.6 oz)  08/02/17 76.7 kg (169 lb)      ASSESSMENT AND PLAN:  1. Chronic Diastolic Heart Failure - ECHO 04/5850 EF 55-60%.  - Volume status much improved after recent 20 pound diuresis with the help of Ronney Lion, NP at [facility] - Continue lasix 20 and spiro 12.5. Watch K closely. Can take extra lasix or metolazone 1.25 as needed  2. Permanent A fib-  - Rate controlled. On Xarelto. No bleeding - PPM followed by Dr. Johney Frame  3. Severe TR - unclear if this is related to tethering from PM  wire. Does not appear to have PAH on echo - given age and stability would not pursue invasive approach - Continue medical therapy. If failing could consider screening for experimental TV clip  4. HTN - Blood pressure a bit labile but well controlled. Continue current regimen.  5. H/o enterococcal sepsis 3/19 - Given proximity to dental procedure would use SBE prophylaxis for all subsequent dental proceudres   Signed, Arvilla Meres, MD  12/12/2018 3:18 PM  Advanced Heart Failure Clinic [facility] [address] [facility] [place] [address] [phone] (office) [phone] (fax)

## 2018-12-22 ENCOUNTER — Other Ambulatory Visit: Payer: Self-pay | Admitting: Internal Medicine

## 2019-01-02 ENCOUNTER — Encounter (HOSPITAL_COMMUNITY): Payer: Self-pay

## 2019-01-18 ENCOUNTER — Other Ambulatory Visit: Payer: Self-pay | Admitting: Internal Medicine

## 2019-01-19 ENCOUNTER — Other Ambulatory Visit: Payer: Self-pay | Admitting: Internal Medicine

## 2019-01-19 MED ORDER — NEBIVOLOL HCL 5 MG PO TABS: 2.5000 mg | ORAL_TABLET | Freq: Every day | ORAL | 0 refills | Status: DC

## 2019-01-19 NOTE — Addendum Note (Signed)
Addended by: BARNARD, CATHY C on: 01/19/2019 03:10 PM ° ° Modules accepted: Orders ° °

## 2019-01-20 NOTE — Telephone Encounter (Signed)
RX sent to pharmacy  

## 2019-01-26 ENCOUNTER — Other Ambulatory Visit (HOSPITAL_COMMUNITY): Payer: Self-pay | Admitting: Internal Medicine

## 2019-01-31 IMAGING — DX DG CHEST 1V PORT
1 series · 1 of 1 positions shown · non-contrast
Comparison: 05/25/2017, 05/24/2017

CLINICAL DATA: Line placement

EXAM:
PORTABLE CHEST 1 VIEW

[chest ap]
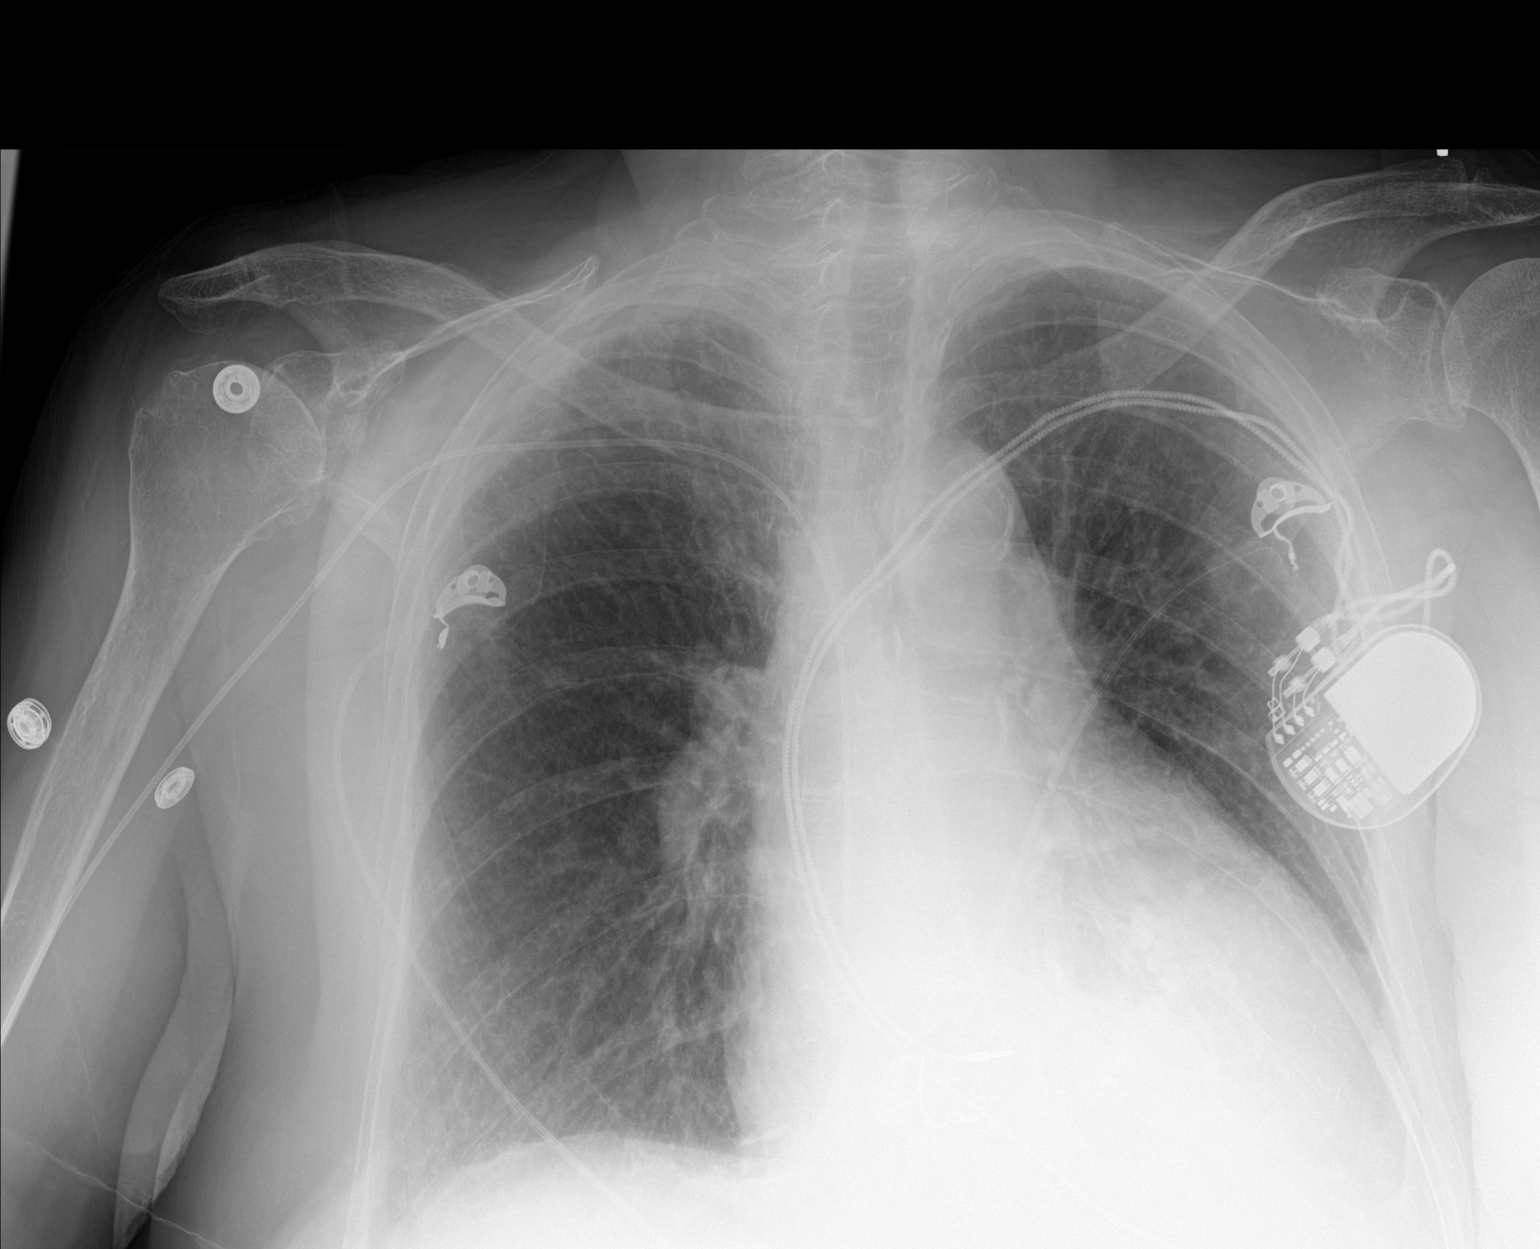

[1 of 1 positions shown; findings below may reference images not displayed]

FINDINGS: Right upper extremity catheter tip difficult to visualize, appears
to project over the caval atrial region. Left-sided pacing device as
before. Cardiomegaly with aortic atherosclerosis. Possible small
left pleural effusion. Increased airspace disease at the left lung
base. Mild diffuse interstitial opacity suspicious for mild degree
of edema.
IMPRESSION: 1. Difficult visualization of right upper extremity catheter, tip
appears to project over the cavoatrial region
2. Cardiomegaly with suspected small pleural effusion. Increased
airspace disease at the left base. Vascular congestion with mild
diffuse increased interstitial opacity, suspect for mild pulmonary
edema.

## 2019-02-06 ENCOUNTER — Other Ambulatory Visit (HOSPITAL_COMMUNITY): Payer: Self-pay | Admitting: Internal Medicine

## 2019-02-15 ENCOUNTER — Ambulatory Visit (INDEPENDENT_AMBULATORY_CARE_PROVIDER_SITE_OTHER): Payer: Medicare Other | Admitting: *Deleted

## 2019-02-15 DIAGNOSIS — I442 Atrioventricular block, complete: Secondary | ICD-10-CM

## 2019-02-15 LAB — CUP PACEART REMOTE DEVICE CHECK
Battery Impedance: 2180 Ohm
Battery Remaining Longevity: 36 mo
Battery Voltage: 2.75 V
Brady Statistic RV Percent Paced: 100 %
Date Time Interrogation Session: 20201125164428
Implantable Lead Implant Date: 20050927
Implantable Lead Implant Date: 20050927
Implantable Lead Location: 753859
Implantable Lead Location: 753860
Implantable Lead Model: 5076
Implantable Lead Model: 5076
Implantable Pulse Generator Implant Date: 20110921
Lead Channel Impedance Value: 545 Ohm
Lead Channel Impedance Value: 67 Ohm
Lead Channel Pacing Threshold Amplitude: 0.75 V
Lead Channel Pacing Threshold Pulse Width: 0.4 ms
Lead Channel Setting Pacing Amplitude: 2.5 V
Lead Channel Setting Pacing Pulse Width: 0.4 ms
Lead Channel Setting Sensing Sensitivity: 2.8 mV

## 2019-03-06 ENCOUNTER — Telehealth (HOSPITAL_COMMUNITY): Payer: Self-pay | Admitting: Pharmacist

## 2019-03-06 MED ORDER — POTASSIUM CHLORIDE CRYS ER 20 MEQ PO TBCR: EXTENDED_RELEASE_TABLET | ORAL | 11 refills | Status: DC

## 2019-03-06 NOTE — Telephone Encounter (Signed)
Sent potassium prescription to local pharmacy per patient's daughter's request. Having difficulties with mail order.   Audry Riles, PharmD, BCPS, BCCP, CPP Heart Failure Clinic Pharmacist [phone]

## 2019-03-14 NOTE — Progress Notes (Signed)
PPM remote 

## 2019-03-16 ENCOUNTER — Other Ambulatory Visit (HOSPITAL_COMMUNITY): Payer: Self-pay | Admitting: Internal Medicine

## 2019-03-16 ENCOUNTER — Telehealth: Payer: Self-pay | Admitting: Internal Medicine

## 2019-03-16 NOTE — Telephone Encounter (Signed)
    Daughter calling, she wants to know if 12/28 appointment should be done virtual, uneasy about bringing her mother to office.  She wants to know if EKG is needed at this time.

## 2019-03-20 ENCOUNTER — Encounter: Payer: Self-pay | Admitting: Internal Medicine

## 2019-03-20 ENCOUNTER — Telehealth (INDEPENDENT_AMBULATORY_CARE_PROVIDER_SITE_OTHER): Payer: Medicare Other | Admitting: Internal Medicine

## 2019-03-20 VITALS — BP 120/74 | HR 61 | Ht 66.0 in | Wt 148.2 lb

## 2019-03-20 DIAGNOSIS — I442 Atrioventricular block, complete: Secondary | ICD-10-CM | POA: Diagnosis not present

## 2019-03-20 DIAGNOSIS — I5032 Chronic diastolic (congestive) heart failure: Secondary | ICD-10-CM

## 2019-03-20 DIAGNOSIS — I10 Essential (primary) hypertension: Secondary | ICD-10-CM | POA: Diagnosis not present

## 2019-03-20 DIAGNOSIS — I071 Rheumatic tricuspid insufficiency: Secondary | ICD-10-CM | POA: Diagnosis not present

## 2019-03-20 NOTE — Progress Notes (Signed)
Electrophysiology TeleHealth Note   Due to national recommendations of social distancing due to COVID 19, an audio/video telehealth visit is felt to be most appropriate for this patient at this time.  See MyChart message from today for the patient's consent to telehealth for [facility].   Date:  03/20/2019   ID:  Beth Santiago, DOB 1924-04-26, MRN [id number]  Location: patient's home  Provider location[facility]  [facility]  Evaluation Performed: Follow-up visit  PCP:  Beth PrinceSouth, [name], MD   Electrophysiologist:  Dr Johney FrameAllred  Chief Complaint:  palpitations  History of Present Illness:    Beth PiggCharlotte Santiago is a 85 y.o. female who presents via telehealth conferencing today.  Since last being seen in our clinic, the patient reports doing very well.  Today, she denies symptoms of palpitations, chest pain, shortness of breath,  lower extremity edema, dizziness, presyncope, or syncope.  The patient is otherwise without complaint today.  The patient denies symptoms of fevers, chills, cough, or new SOB worrisome for COVID 19.  Past Medical History:  Diagnosis Date  . Atrial flutter (HCC)    a. Noted during pacemaker implantation 2011, spontaneously terminated.  . Biatrial enlargement    severe  . CAD (coronary artery disease)    a. Nonobstructive by cath 2005 - 40-50% mid LAD. b. Nonischemic nuc 05/2011.  . Carotid artery disease (HCC)    a. 1-50% bilateral (upper end) - March 2011.  Marland Kitchen. CHF (congestive heart failure) (HCC)   . Complete heart block (HCC)    a. s/p PPM 2005. b. Gen change to Medtronic by Dr Johney FrameAllred  9/11 for premature ERI 11/2009.  Marland Kitchen. DJD (degenerative joint disease)   . GERD (gastroesophageal reflux disease)   . Hemoptysis 05/2016  . Hyperlipidemia   . Hypertension   . Hypothyroid   . Permanent atrial fibrillation (HCC) 05/2011  . Severe tricuspid regurgitation     Past Surgical History:  Procedure Laterality Date  . ABDOMINAL SURGERY     twisted bowel  .  CARDIOVERSION  06/11/2011   Procedure: CARDIOVERSION;  Surgeon: Dolores Pattyaniel R Bensimhon, MD;  [facility];  Service: Cardiovascular;  Laterality: N/A;  . CARDIOVERSION N/A 01/04/2013   Procedure: CARDIOVERSION;  Surgeon: Lars MassonKatarina H Nelson, MD;  [facility];  Service: Cardiovascular;  Laterality: N/A;  . COLON SURGERY    . HEMORROIDECTOMY    . PACEMAKER INSERTION  2005, 12/11/09   implanted by Dr Reyes IvanKersey, generator change 9/11 by Fawn KirkJA for premature ERI  . TEE WITHOUT CARDIOVERSION  06/11/2011   Procedure: TRANSESOPHAGEAL ECHOCARDIOGRAM (TEE);  Surgeon: Dolores Pattyaniel R Bensimhon, MD;  [facility];  Service: Cardiovascular;  Laterality: N/A;  . TEE WITHOUT CARDIOVERSION N/A 05/27/2017   Procedure: TRANSESOPHAGEAL ECHOCARDIOGRAM (TEE);  Surgeon: Jake BatheSkains, Mark C, MD;  [facility];  Service: Cardiovascular;  Laterality: N/A;  . TONSILLECTOMY      Current Outpatient Medications  Medication Sig Dispense Refill  . amLODipine (NORVASC) 5 MG tablet Take 5 mg by mouth as needed (as needed for BP).     Marland Kitchen. amoxicillin (AMOXIL) 500 MG capsule TAKE 4 CAPSULES BY MOUTH AS NEEDED (1 HOUR PRIOR TO DENTAL WORK) 4 capsule 0  . calcium carbonate (TUMS - DOSED IN MG ELEMENTAL CALCIUM) 500 MG chewable tablet Chew 1 tablet (200 mg of elemental calcium total) by mouth as needed for indigestion or heartburn.    . co-enzyme Q-10 30 MG capsule Take 30 mg by mouth daily.    . diclofenac Sodium (VOLTAREN) 1 %  GEL     . fish oil-omega-3 fatty acids 1000 MG capsule Take 2 g daily by mouth.     . furosemide (LASIX) 40 MG tablet Take 20 mg by mouth daily.    Marland Kitchen ketoconazole (NIZORAL) 2 % cream     . levothyroxine (SYNTHROID, LEVOTHROID) 88 MCG tablet Take 88 mcg by mouth daily. On Tuesday 1/2 pill    . Misc Natural Products (OSTEO BI-FLEX JOINT SHIELD PO) Take 1 tablet by mouth daily.    . Multiple Vitamins-Minerals (CENTRUM SILVER PO) Take 1 tablet by mouth daily.      . nebivolol (BYSTOLIC) 5 MG  tablet Take 0.5 tablets (2.5 mg total) by mouth daily. 45 tablet 0  . omeprazole (PRILOSEC) 20 MG capsule Take 20 mg by mouth 2 (two) times daily before a meal. Take once or twice a day    . potassium chloride SA (KLOR-CON) 20 MEQ tablet TAKE 1 TABLET BY MOUTH  DAILY AND TAKE 1 EXTRA  TABLET WHEN YOU TAKE EXTRA  LASIX 40 tablet 11  . rosuvastatin (CRESTOR) 20 MG tablet Take 10 mg by mouth 3 (three) times a week. Tue, Thur and Sat    . spironolactone (ALDACTONE) 25 MG tablet Take 12.5 mg by mouth daily.    . timolol (BETIMOL) 0.5 % ophthalmic solution Place 1 drop into both eyes every morning.     Marland Kitchen TRAMADOL HCL PO Take by mouth as needed.    . valACYclovir (VALTREX) 1000 MG tablet Take 1,000 mg by mouth. Take 1 tab mouth 3 times daily for 7 days    . XARELTO 20 MG TABS tablet TAKE 1 TABLET BY MOUTH DAILY WITH SUPPER 30 tablet 5   No current facility-administered medications for this visit.    Allergies:   Patient has no known allergies.   Social History:  The patient  reports that she has never smoked. She has never used smokeless tobacco. She reports current alcohol use. She reports that she does not use drugs.   Family History:  The patient's  family history includes Cancer in her brother and mother; Diabetes in her mother; Heart disease in her brother and father; Hypertension in her mother.   ROS:  Please see the history of present illness.   All other systems are personally reviewed and negative.    Exam:    Vital Signs:  BP 120/74   Pulse 61   Ht 5\' 6"  (1.676 m)   Wt 148 lb 3.2 oz (67.2 kg)   BMI 23.92 kg/m   Well sounding and elderly appearing, alert and conversant, regular work of breathing,  good skin color Eyes- anicteric, neuro- grossly intact, skin- no apparent rash or lesions or cyanosis, mouth- oral mucosa is pink  Labs/Other Tests and Data Reviewed:    Recent Labs: 12/12/2018: B Natriuretic Peptide 790.1; BUN 11; Creatinine, Ser 0.59; Potassium 4.6; Sodium 133   Wt  Readings from Last 3 Encounters:  03/20/19 148 lb 3.2 oz (67.2 kg)  12/12/18 152 lb 12.8 oz (69.3 kg)  03/11/18 156 lb (70.8 kg)     Last device remote is reviewed from [place] PDF which reveals normal device function    ASSESSMENT & PLAN:    1.  Complete heart block Remotes are uptodate Normal device function Given prior enteroccocus bacteremia after dental work, I would advise prophylaxis with dental procedures  2. Permanent afib Rate controlled On xarelto for chads2vasc score of at least 4  3. HTN Stable No change required today  4. Chronic diastolic dysfunction/ severe TR Followed by Dr Gala Romney  Follow-up:  Remotes, Return to see EP PA in a year Follow-up with Dr Gala Romney as scheduled   Patient Risk:  after full review of this patients clinical status, I feel that they are at moderate risk at this time.  Today, I have spent 15 minutes with the patient with telehealth technology discussing arrhythmia management .    Randolm Idol, MD  03/20/2019 3:18 PM     [facility] [address] [place] [address] [phone] (office) [phone] (fax)

## 2019-04-20 ENCOUNTER — Other Ambulatory Visit (HOSPITAL_COMMUNITY): Payer: Self-pay | Admitting: Internal Medicine

## 2019-04-20 ENCOUNTER — Other Ambulatory Visit: Payer: Self-pay | Admitting: Physician Assistant

## 2019-05-17 ENCOUNTER — Ambulatory Visit (INDEPENDENT_AMBULATORY_CARE_PROVIDER_SITE_OTHER): Payer: Medicare Other | Admitting: *Deleted

## 2019-05-17 DIAGNOSIS — I442 Atrioventricular block, complete: Secondary | ICD-10-CM

## 2019-05-18 LAB — CUP PACEART REMOTE DEVICE CHECK
Battery Impedance: 2180 Ohm
Battery Remaining Longevity: 36 mo
Battery Voltage: 2.74 V
Brady Statistic RV Percent Paced: 100 %
Date Time Interrogation Session: 20210225123911
Implantable Lead Implant Date: 20050927
Implantable Lead Implant Date: 20050927
Implantable Lead Location: 753859
Implantable Lead Location: 753860
Implantable Lead Model: 5076
Implantable Lead Model: 5076
Implantable Pulse Generator Implant Date: 20110921
Lead Channel Impedance Value: 545 Ohm
Lead Channel Impedance Value: 67 Ohm
Lead Channel Pacing Threshold Amplitude: 1 V
Lead Channel Pacing Threshold Pulse Width: 0.4 ms
Lead Channel Setting Pacing Amplitude: 2.5 V
Lead Channel Setting Pacing Pulse Width: 0.4 ms
Lead Channel Setting Sensing Sensitivity: 2.8 mV

## 2019-06-02 ENCOUNTER — Encounter (HOSPITAL_COMMUNITY): Payer: Medicare Other | Admitting: Internal Medicine

## 2019-06-02 ENCOUNTER — Ambulatory Visit (HOSPITAL_COMMUNITY): Payer: Medicare Other

## 2019-08-16 ENCOUNTER — Ambulatory Visit (INDEPENDENT_AMBULATORY_CARE_PROVIDER_SITE_OTHER): Payer: Medicare Other | Admitting: *Deleted

## 2019-08-16 DIAGNOSIS — I495 Sick sinus syndrome: Secondary | ICD-10-CM

## 2019-08-17 ENCOUNTER — Telehealth: Payer: Self-pay

## 2019-08-17 LAB — CUP PACEART REMOTE DEVICE CHECK
Battery Impedance: 2370 Ohm
Battery Remaining Longevity: 34 mo
Battery Voltage: 2.74 V
Brady Statistic RV Percent Paced: 100 %
Date Time Interrogation Session: 20210527132959
Implantable Lead Implant Date: 20050927
Implantable Lead Implant Date: 20050927
Implantable Lead Location: 753859
Implantable Lead Location: 753860
Implantable Lead Model: 5076
Implantable Lead Model: 5076
Implantable Pulse Generator Implant Date: 20110921
Lead Channel Impedance Value: 558 Ohm
Lead Channel Impedance Value: 67 Ohm
Lead Channel Pacing Threshold Amplitude: 1 V
Lead Channel Pacing Threshold Pulse Width: 0.4 ms
Lead Channel Setting Pacing Amplitude: 2.5 V
Lead Channel Setting Pacing Pulse Width: 0.4 ms
Lead Channel Setting Sensing Sensitivity: 2.8 mV

## 2019-08-17 NOTE — Telephone Encounter (Signed)
Spoke with daughter [name] to remind of missed remote transmission

## 2019-08-18 NOTE — Progress Notes (Signed)
Remote pacemaker transmission.   

## 2019-09-26 ENCOUNTER — Telehealth (HOSPITAL_COMMUNITY): Payer: Self-pay

## 2019-09-26 NOTE — Telephone Encounter (Signed)
Patients daughter [name] called and left a message on the triage vm stated that the patient has been experiencing a bad cold for last 2-3 weeks. Patient was prescribed a z-pak from her pcp on last Wednesday(

## 2019-09-26 NOTE — Telephone Encounter (Signed)
Take lasix 40 mg daily for the next 2 days then back 20 mg daily.   Thanks,   [place] San NP-C  3:43 PM

## 2019-09-26 NOTE — Telephone Encounter (Signed)
Patients daughter [name] called and left a message on the triage vm stated that her mother, Beth Santiago has been experiencing a bad cold for last 2-3 weeks. Beth Santiago was prescribed a 5 day z-pak from her pcp on last Wednesday(09/20/19) and takes Lasix 20mg  qd but did have 1.5 tablets on Sunday. However, Friday feels as if Charlottes chf is partially to blame for her symptoms. Beth Santiago,states that her mom Beth Santiago is doing better compared to last week although, she noticed some swelling in her ankles yesterday but not today,Beth Santiago also states that the patient has had a lot of congestion and states that she has been very fatigue. She also states that Beth Santiago has been complaining of sob while laying down. Beth Santiago also reports that Beth Santiago was active yesterday but today she complains of being fatigue. Please advise

## 2019-09-26 NOTE — Telephone Encounter (Signed)
Patients daughter, Beth Santiago advised and verbalized understanding.

## 2019-10-18 ENCOUNTER — Other Ambulatory Visit (HOSPITAL_COMMUNITY): Payer: Self-pay | Admitting: Internal Medicine

## 2019-10-23 ENCOUNTER — Telehealth: Payer: Self-pay

## 2019-10-23 NOTE — Telephone Encounter (Signed)
The pt daughter in law Isabelle Course wanted to know if the pt monitor needed a land line or cell phone for the monitor to work. I asked her was the pt with her because she is not on the dpr and I needed permission to speak with her. Her husband is Onalee Hua is not on the dpr as well. I told her I need permission from the pt or someone that is on the dpr can call.

## 2019-12-06 ENCOUNTER — Ambulatory Visit (INDEPENDENT_AMBULATORY_CARE_PROVIDER_SITE_OTHER): Payer: Medicare Other | Admitting: *Deleted

## 2019-12-06 ENCOUNTER — Other Ambulatory Visit (HOSPITAL_COMMUNITY): Payer: Self-pay | Admitting: Internal Medicine

## 2019-12-06 DIAGNOSIS — I442 Atrioventricular block, complete: Secondary | ICD-10-CM | POA: Diagnosis not present

## 2019-12-06 LAB — CUP PACEART REMOTE DEVICE CHECK
Battery Impedance: 2561 Ohm
Battery Remaining Longevity: 31 mo
Battery Voltage: 2.74 V
Brady Statistic RV Percent Paced: 100 %
Date Time Interrogation Session: 20210914155717
Implantable Lead Implant Date: 20050927
Implantable Lead Implant Date: 20050927
Implantable Lead Location: 753859
Implantable Lead Location: 753860
Implantable Lead Model: 5076
Implantable Lead Model: 5076
Implantable Pulse Generator Implant Date: 20110921
Lead Channel Impedance Value: 541 Ohm
Lead Channel Impedance Value: 67 Ohm
Lead Channel Pacing Threshold Amplitude: 1.125 V
Lead Channel Pacing Threshold Pulse Width: 0.4 ms
Lead Channel Setting Pacing Amplitude: 2.5 V
Lead Channel Setting Pacing Pulse Width: 0.4 ms
Lead Channel Setting Sensing Sensitivity: 2.8 mV

## 2019-12-08 NOTE — Progress Notes (Signed)
Remote pacemaker transmission.   

## 2020-03-06 ENCOUNTER — Ambulatory Visit (INDEPENDENT_AMBULATORY_CARE_PROVIDER_SITE_OTHER): Payer: Medicare Other

## 2020-03-06 DIAGNOSIS — I495 Sick sinus syndrome: Secondary | ICD-10-CM

## 2020-03-07 LAB — CUP PACEART REMOTE DEVICE CHECK
Battery Impedance: 2538 Ohm
Battery Remaining Longevity: 30 mo
Battery Voltage: 2.74 V
Brady Statistic RV Percent Paced: 100 %
Date Time Interrogation Session: 20211216101310
Implantable Lead Implant Date: 20050927
Implantable Lead Implant Date: 20050927
Implantable Lead Location: 753859
Implantable Lead Location: 753860
Implantable Lead Model: 5076
Implantable Lead Model: 5076
Implantable Pulse Generator Implant Date: 20110921
Lead Channel Impedance Value: 438 Ohm
Lead Channel Impedance Value: 67 Ohm
Lead Channel Pacing Threshold Amplitude: 1.125 V
Lead Channel Pacing Threshold Pulse Width: 0.4 ms
Lead Channel Setting Pacing Amplitude: 2.5 V
Lead Channel Setting Pacing Pulse Width: 0.4 ms
Lead Channel Setting Sensing Sensitivity: 2.8 mV

## 2020-03-20 NOTE — Progress Notes (Signed)
Remote pacemaker transmission.   

## 2020-05-15 DIAGNOSIS — F331 Major depressive disorder, recurrent, moderate: Secondary | ICD-10-CM | POA: Diagnosis not present

## 2020-05-15 DIAGNOSIS — R69 Illness, unspecified: Secondary | ICD-10-CM | POA: Diagnosis not present

## 2020-06-12 DIAGNOSIS — R278 Other lack of coordination: Secondary | ICD-10-CM | POA: Diagnosis not present

## 2020-06-12 DIAGNOSIS — M5459 Other low back pain: Secondary | ICD-10-CM | POA: Diagnosis not present

## 2020-06-12 DIAGNOSIS — M25561 Pain in right knee: Secondary | ICD-10-CM | POA: Diagnosis not present

## 2020-06-12 DIAGNOSIS — R293 Abnormal posture: Secondary | ICD-10-CM | POA: Diagnosis not present

## 2020-06-12 DIAGNOSIS — I5021 Acute systolic (congestive) heart failure: Secondary | ICD-10-CM | POA: Diagnosis not present

## 2020-06-12 DIAGNOSIS — M25562 Pain in left knee: Secondary | ICD-10-CM | POA: Diagnosis not present

## 2020-06-12 DIAGNOSIS — M6281 Muscle weakness (generalized): Secondary | ICD-10-CM | POA: Diagnosis not present

## 2020-06-13 DIAGNOSIS — M25561 Pain in right knee: Secondary | ICD-10-CM | POA: Diagnosis not present

## 2020-06-13 DIAGNOSIS — R278 Other lack of coordination: Secondary | ICD-10-CM | POA: Diagnosis not present

## 2020-06-13 DIAGNOSIS — M25562 Pain in left knee: Secondary | ICD-10-CM | POA: Diagnosis not present

## 2020-06-13 DIAGNOSIS — M6281 Muscle weakness (generalized): Secondary | ICD-10-CM | POA: Diagnosis not present

## 2020-06-13 DIAGNOSIS — R293 Abnormal posture: Secondary | ICD-10-CM | POA: Diagnosis not present

## 2020-06-13 DIAGNOSIS — M5459 Other low back pain: Secondary | ICD-10-CM | POA: Diagnosis not present

## 2020-06-13 DIAGNOSIS — I5021 Acute systolic (congestive) heart failure: Secondary | ICD-10-CM | POA: Diagnosis not present

## 2020-06-14 DIAGNOSIS — M5459 Other low back pain: Secondary | ICD-10-CM | POA: Diagnosis not present

## 2020-06-14 DIAGNOSIS — M25562 Pain in left knee: Secondary | ICD-10-CM | POA: Diagnosis not present

## 2020-06-14 DIAGNOSIS — R293 Abnormal posture: Secondary | ICD-10-CM | POA: Diagnosis not present

## 2020-06-14 DIAGNOSIS — M6281 Muscle weakness (generalized): Secondary | ICD-10-CM | POA: Diagnosis not present

## 2020-06-14 DIAGNOSIS — I5021 Acute systolic (congestive) heart failure: Secondary | ICD-10-CM | POA: Diagnosis not present

## 2020-06-14 DIAGNOSIS — M25561 Pain in right knee: Secondary | ICD-10-CM | POA: Diagnosis not present

## 2020-06-14 DIAGNOSIS — R278 Other lack of coordination: Secondary | ICD-10-CM | POA: Diagnosis not present

## 2020-06-17 DIAGNOSIS — R278 Other lack of coordination: Secondary | ICD-10-CM | POA: Diagnosis not present

## 2020-06-17 DIAGNOSIS — M6281 Muscle weakness (generalized): Secondary | ICD-10-CM | POA: Diagnosis not present

## 2020-06-17 DIAGNOSIS — R293 Abnormal posture: Secondary | ICD-10-CM | POA: Diagnosis not present

## 2020-06-17 DIAGNOSIS — I5021 Acute systolic (congestive) heart failure: Secondary | ICD-10-CM | POA: Diagnosis not present

## 2020-06-17 DIAGNOSIS — M25562 Pain in left knee: Secondary | ICD-10-CM | POA: Diagnosis not present

## 2020-06-17 DIAGNOSIS — M5459 Other low back pain: Secondary | ICD-10-CM | POA: Diagnosis not present

## 2020-06-17 DIAGNOSIS — M25561 Pain in right knee: Secondary | ICD-10-CM | POA: Diagnosis not present

## 2020-06-18 DIAGNOSIS — I5021 Acute systolic (congestive) heart failure: Secondary | ICD-10-CM | POA: Diagnosis not present

## 2020-06-18 DIAGNOSIS — M25561 Pain in right knee: Secondary | ICD-10-CM | POA: Diagnosis not present

## 2020-06-18 DIAGNOSIS — M5459 Other low back pain: Secondary | ICD-10-CM | POA: Diagnosis not present

## 2020-06-18 DIAGNOSIS — M25562 Pain in left knee: Secondary | ICD-10-CM | POA: Diagnosis not present

## 2020-06-18 DIAGNOSIS — R293 Abnormal posture: Secondary | ICD-10-CM | POA: Diagnosis not present

## 2020-06-18 DIAGNOSIS — M6281 Muscle weakness (generalized): Secondary | ICD-10-CM | POA: Diagnosis not present

## 2020-06-18 DIAGNOSIS — R278 Other lack of coordination: Secondary | ICD-10-CM | POA: Diagnosis not present

## 2020-06-20 DIAGNOSIS — I5021 Acute systolic (congestive) heart failure: Secondary | ICD-10-CM | POA: Diagnosis not present

## 2020-06-20 DIAGNOSIS — M25562 Pain in left knee: Secondary | ICD-10-CM | POA: Diagnosis not present

## 2020-06-20 DIAGNOSIS — R293 Abnormal posture: Secondary | ICD-10-CM | POA: Diagnosis not present

## 2020-06-20 DIAGNOSIS — M6281 Muscle weakness (generalized): Secondary | ICD-10-CM | POA: Diagnosis not present

## 2020-06-20 DIAGNOSIS — R278 Other lack of coordination: Secondary | ICD-10-CM | POA: Diagnosis not present

## 2020-06-20 DIAGNOSIS — M5459 Other low back pain: Secondary | ICD-10-CM | POA: Diagnosis not present

## 2020-06-20 DIAGNOSIS — M25561 Pain in right knee: Secondary | ICD-10-CM | POA: Diagnosis not present

## 2020-06-25 DIAGNOSIS — I5021 Acute systolic (congestive) heart failure: Secondary | ICD-10-CM | POA: Diagnosis not present

## 2020-06-25 DIAGNOSIS — R278 Other lack of coordination: Secondary | ICD-10-CM | POA: Diagnosis not present

## 2020-06-25 DIAGNOSIS — R293 Abnormal posture: Secondary | ICD-10-CM | POA: Diagnosis not present

## 2020-06-25 DIAGNOSIS — M6281 Muscle weakness (generalized): Secondary | ICD-10-CM | POA: Diagnosis not present

## 2020-06-25 DIAGNOSIS — R2681 Unsteadiness on feet: Secondary | ICD-10-CM | POA: Diagnosis not present

## 2020-06-25 DIAGNOSIS — I10 Essential (primary) hypertension: Secondary | ICD-10-CM | POA: Diagnosis not present

## 2020-06-27 DIAGNOSIS — R278 Other lack of coordination: Secondary | ICD-10-CM | POA: Diagnosis not present

## 2020-06-27 DIAGNOSIS — I5021 Acute systolic (congestive) heart failure: Secondary | ICD-10-CM | POA: Diagnosis not present

## 2020-06-27 DIAGNOSIS — I10 Essential (primary) hypertension: Secondary | ICD-10-CM | POA: Diagnosis not present

## 2020-06-27 DIAGNOSIS — R293 Abnormal posture: Secondary | ICD-10-CM | POA: Diagnosis not present

## 2020-06-27 DIAGNOSIS — M6281 Muscle weakness (generalized): Secondary | ICD-10-CM | POA: Diagnosis not present

## 2020-06-27 DIAGNOSIS — R2681 Unsteadiness on feet: Secondary | ICD-10-CM | POA: Diagnosis not present

## 2020-07-01 DIAGNOSIS — I10 Essential (primary) hypertension: Secondary | ICD-10-CM | POA: Diagnosis not present

## 2020-07-01 DIAGNOSIS — I5021 Acute systolic (congestive) heart failure: Secondary | ICD-10-CM | POA: Diagnosis not present

## 2020-07-01 DIAGNOSIS — R293 Abnormal posture: Secondary | ICD-10-CM | POA: Diagnosis not present

## 2020-07-01 DIAGNOSIS — R278 Other lack of coordination: Secondary | ICD-10-CM | POA: Diagnosis not present

## 2020-07-01 DIAGNOSIS — R2681 Unsteadiness on feet: Secondary | ICD-10-CM | POA: Diagnosis not present

## 2020-07-01 DIAGNOSIS — M6281 Muscle weakness (generalized): Secondary | ICD-10-CM | POA: Diagnosis not present

## 2020-07-03 DIAGNOSIS — R2681 Unsteadiness on feet: Secondary | ICD-10-CM | POA: Diagnosis not present

## 2020-07-03 DIAGNOSIS — I10 Essential (primary) hypertension: Secondary | ICD-10-CM | POA: Diagnosis not present

## 2020-07-03 DIAGNOSIS — R278 Other lack of coordination: Secondary | ICD-10-CM | POA: Diagnosis not present

## 2020-07-03 DIAGNOSIS — R293 Abnormal posture: Secondary | ICD-10-CM | POA: Diagnosis not present

## 2020-07-03 DIAGNOSIS — M6281 Muscle weakness (generalized): Secondary | ICD-10-CM | POA: Diagnosis not present

## 2020-07-03 DIAGNOSIS — I5021 Acute systolic (congestive) heart failure: Secondary | ICD-10-CM | POA: Diagnosis not present

## 2020-07-16 ENCOUNTER — Ambulatory Visit (INDEPENDENT_AMBULATORY_CARE_PROVIDER_SITE_OTHER): Payer: Medicare HMO

## 2020-07-16 DIAGNOSIS — I442 Atrioventricular block, complete: Secondary | ICD-10-CM

## 2020-07-22 LAB — CUP PACEART REMOTE DEVICE CHECK
Battery Impedance: 2805 Ohm
Battery Remaining Longevity: 27 mo
Battery Voltage: 2.73 V
Brady Statistic RV Percent Paced: 100 %
Date Time Interrogation Session: 20220426091816
Implantable Lead Implant Date: 20050927
Implantable Lead Implant Date: 20050927
Implantable Lead Location: 753859
Implantable Lead Location: 753860
Implantable Lead Model: 5076
Implantable Lead Model: 5076
Implantable Pulse Generator Implant Date: 20110921
Lead Channel Impedance Value: 438 Ohm
Lead Channel Impedance Value: 67 Ohm
Lead Channel Pacing Threshold Amplitude: 1.125 V
Lead Channel Pacing Threshold Pulse Width: 0.4 ms
Lead Channel Setting Pacing Amplitude: 2.5 V
Lead Channel Setting Pacing Pulse Width: 0.4 ms
Lead Channel Setting Sensing Sensitivity: 2 mV

## 2020-08-06 NOTE — Progress Notes (Signed)
Remote pacemaker transmission.   

## 2020-08-14 DIAGNOSIS — R69 Illness, unspecified: Secondary | ICD-10-CM | POA: Diagnosis not present

## 2020-08-14 DIAGNOSIS — F331 Major depressive disorder, recurrent, moderate: Secondary | ICD-10-CM | POA: Diagnosis not present

## 2020-09-10 DIAGNOSIS — R293 Abnormal posture: Secondary | ICD-10-CM | POA: Diagnosis not present

## 2020-09-10 DIAGNOSIS — R278 Other lack of coordination: Secondary | ICD-10-CM | POA: Diagnosis not present

## 2020-09-10 DIAGNOSIS — M6281 Muscle weakness (generalized): Secondary | ICD-10-CM | POA: Diagnosis not present

## 2020-09-10 DIAGNOSIS — M170 Bilateral primary osteoarthritis of knee: Secondary | ICD-10-CM | POA: Diagnosis not present

## 2020-09-11 DIAGNOSIS — M6281 Muscle weakness (generalized): Secondary | ICD-10-CM | POA: Diagnosis not present

## 2020-09-11 DIAGNOSIS — R278 Other lack of coordination: Secondary | ICD-10-CM | POA: Diagnosis not present

## 2020-09-11 DIAGNOSIS — M170 Bilateral primary osteoarthritis of knee: Secondary | ICD-10-CM | POA: Diagnosis not present

## 2020-09-11 DIAGNOSIS — R293 Abnormal posture: Secondary | ICD-10-CM | POA: Diagnosis not present

## 2020-09-12 DIAGNOSIS — M170 Bilateral primary osteoarthritis of knee: Secondary | ICD-10-CM | POA: Diagnosis not present

## 2020-09-12 DIAGNOSIS — R293 Abnormal posture: Secondary | ICD-10-CM | POA: Diagnosis not present

## 2020-09-12 DIAGNOSIS — R278 Other lack of coordination: Secondary | ICD-10-CM | POA: Diagnosis not present

## 2020-09-12 DIAGNOSIS — M6281 Muscle weakness (generalized): Secondary | ICD-10-CM | POA: Diagnosis not present

## 2020-09-13 DIAGNOSIS — R293 Abnormal posture: Secondary | ICD-10-CM | POA: Diagnosis not present

## 2020-09-13 DIAGNOSIS — M6281 Muscle weakness (generalized): Secondary | ICD-10-CM | POA: Diagnosis not present

## 2020-09-13 DIAGNOSIS — R278 Other lack of coordination: Secondary | ICD-10-CM | POA: Diagnosis not present

## 2020-09-13 DIAGNOSIS — M170 Bilateral primary osteoarthritis of knee: Secondary | ICD-10-CM | POA: Diagnosis not present

## 2020-09-16 DIAGNOSIS — R293 Abnormal posture: Secondary | ICD-10-CM | POA: Diagnosis not present

## 2020-09-16 DIAGNOSIS — M170 Bilateral primary osteoarthritis of knee: Secondary | ICD-10-CM | POA: Diagnosis not present

## 2020-09-16 DIAGNOSIS — M6281 Muscle weakness (generalized): Secondary | ICD-10-CM | POA: Diagnosis not present

## 2020-09-16 DIAGNOSIS — R278 Other lack of coordination: Secondary | ICD-10-CM | POA: Diagnosis not present

## 2020-09-17 DIAGNOSIS — M170 Bilateral primary osteoarthritis of knee: Secondary | ICD-10-CM | POA: Diagnosis not present

## 2020-09-17 DIAGNOSIS — R293 Abnormal posture: Secondary | ICD-10-CM | POA: Diagnosis not present

## 2020-09-17 DIAGNOSIS — M6281 Muscle weakness (generalized): Secondary | ICD-10-CM | POA: Diagnosis not present

## 2020-09-17 DIAGNOSIS — R278 Other lack of coordination: Secondary | ICD-10-CM | POA: Diagnosis not present

## 2020-09-18 DIAGNOSIS — H40013 Open angle with borderline findings, low risk, bilateral: Secondary | ICD-10-CM | POA: Diagnosis not present

## 2020-09-18 DIAGNOSIS — Z961 Presence of intraocular lens: Secondary | ICD-10-CM | POA: Diagnosis not present

## 2020-09-18 DIAGNOSIS — H04123 Dry eye syndrome of bilateral lacrimal glands: Secondary | ICD-10-CM | POA: Diagnosis not present

## 2020-10-16 ENCOUNTER — Ambulatory Visit (INDEPENDENT_AMBULATORY_CARE_PROVIDER_SITE_OTHER): Payer: Medicare Other

## 2020-10-16 DIAGNOSIS — I495 Sick sinus syndrome: Secondary | ICD-10-CM

## 2020-10-16 LAB — CUP PACEART REMOTE DEVICE CHECK
Battery Impedance: 2961 Ohm
Battery Remaining Longevity: 13 mo
Battery Voltage: 2.69 V
Brady Statistic RV Percent Paced: 100 %
Date Time Interrogation Session: 20220727123503
Implantable Lead Implant Date: 20050927
Implantable Lead Implant Date: 20050927
Implantable Lead Location: 753859
Implantable Lead Location: 753860
Implantable Lead Model: 5076
Implantable Lead Model: 5076
Implantable Pulse Generator Implant Date: 20110921
Lead Channel Impedance Value: 472 Ohm
Lead Channel Impedance Value: 67 Ohm
Lead Channel Pacing Threshold Amplitude: 1.25 V
Lead Channel Pacing Threshold Pulse Width: 0.4 ms
Lead Channel Setting Pacing Amplitude: 2.75 V
Lead Channel Setting Pacing Pulse Width: 1 ms
Lead Channel Setting Sensing Sensitivity: 2 mV

## 2020-11-03 NOTE — Progress Notes (Signed)
Electrophysiology Office Note Date: 11/05/2020  ID:  Beth Santiago, DOB 05/27/1924, MRN [id number]  PCP: Adrian Prince, MD Primary Cardiologist: Arvilla Meres, MD Electrophysiologist: [name], MD   CC: Pacemaker follow-up  Beth Santiago is a 87 y.o. female seen today for [name], MD for routine electrophysiology followup.  Since last being seen in our clinic the patient reports doing very well overall. At [facility].  she denies chest pain, palpitations, dyspnea, PND, orthopnea, nausea, vomiting, dizziness, syncope, edema, weight gain, or early satiety.  Device History: Medtronic Dual Chamber PPM implanted 2005 for CHB  Past Medical History:  Diagnosis Date   Atrial flutter (HCC)    a. Noted during pacemaker implantation 2011, spontaneously terminated.   Biatrial enlargement    severe   CAD (coronary artery disease)    a. Nonobstructive by cath 2005 - 40-50% mid LAD. b. Nonischemic nuc 05/2011.   Carotid artery disease (HCC)    a. 1-50% bilateral (upper end) - March 2011.   CHF (congestive heart failure) (HCC)    Complete heart block (HCC)    a. s/p PPM 2005. b. Gen change to Medtronic by Dr Johney Frame  9/11 for premature ERI 11/2009.   DJD (degenerative joint disease)    GERD (gastroesophageal reflux disease)    Hemoptysis 05/2016   Hyperlipidemia    Hypertension    Hypothyroid    Permanent atrial fibrillation (HCC) 05/2011   Severe tricuspid regurgitation    Past Surgical History:  Procedure Laterality Date   ABDOMINAL SURGERY     twisted bowel   CARDIOVERSION  06/11/2011   Procedure: CARDIOVERSION;  Surgeon: Dolores Patty, MD;  [facility]COPY;  Service: Cardiovascular;  Laterality: N/A;   CARDIOVERSION N/A 01/04/2013   Procedure: CARDIOVERSION;  Surgeon: Lars Masson, MD;  Location[facility] ENDOSCOPY;  Service: Cardiovascular;  Laterality: N/A;   COLON SURGERY     HEMORROIDECTOMY     PACEMAKER INSERTION  2005, 12/11/09   implanted by  Dr Reyes Ivan, generator change 9/11 by JA for premature ERI   TEE WITHOUT CARDIOVERSION  06/11/2011   Procedure: TRANSESOPHAGEAL ECHOCARDIOGRAM (TEE);  Surgeon: Dolores Patty, MD;  Location: [facility] ENDOSCOPY;  Service: Cardiovascular;  Laterality: N/A;   TEE WITHOUT CARDIOVERSION N/A 05/27/2017   Procedure: TRANSESOPHAGEAL ECHOCARDIOGRAM (TEE);  Surgeon: Jake Bathe, MD;  Location: [facility] ENDOSCOPY;  Service: Cardiovascular;  Laterality: N/A;   TONSILLECTOMY      Current Outpatient Medications  Medication Sig Dispense Refill   amLODipine (NORVASC) 5 MG tablet Take 1 tablet (5 mg total) by mouth daily. Needs appt for further refills 90 tablet 0   amoxicillin (AMOXIL) 500 MG capsule TAKE 4 CAPSULES BY MOUTH AS NEEDED (1 HOUR PRIOR TO DENTAL WORK) 4 capsule 0   BYSTOLIC 5 MG tablet TAKE 1/2 TABLET BY MOUTH DAILY 45 tablet 3   calcium carbonate (TUMS - DOSED IN MG ELEMENTAL CALCIUM) 500 MG chewable tablet Chew 1 tablet (200 mg of elemental calcium total) by mouth as needed for indigestion or heartburn.     Cholecalciferol (VITAMIN D-3) 125 MCG (5000 UT) TABS Take 5,000 Units by mouth daily.     co-enzyme Q-10 30 MG capsule Take 30 mg by mouth daily.     diclofenac Sodium (VOLTAREN) 1 % GEL      fish oil-omega-3 fatty acids 1000 MG capsule Take 2 g daily by mouth.      furosemide (LASIX) 40 MG tablet Take 20 mg by mouth daily.  ketoconazole (NIZORAL) 2 % cream      levothyroxine (SYNTHROID, LEVOTHROID) 88 MCG tablet Take 88 mcg by mouth daily. On Tuesday 1/2 pill     Misc Natural Products (OSTEO BI-FLEX JOINT SHIELD PO) Take 1 tablet by mouth daily.     Multiple Vitamins-Minerals (CENTRUM SILVER PO) Take 1 tablet by mouth daily.       omeprazole (PRILOSEC) 20 MG capsule Take 20 mg by mouth daily.     polyvinyl alcohol (LIQUIFILM TEARS) 1.4 % ophthalmic solution Place 1.4 drops into both eyes 2 (two) times daily.     potassium chloride SA (KLOR-CON) 20 MEQ tablet TAKE 1 TABLET BY MOUTH  DAILY AND  TAKE 1 EXTRA  TABLET WHEN YOU TAKE EXTRA  LASIX 40 tablet 11   rivaroxaban (XARELTO) 20 MG TABS tablet Take 1 tablet (20 mg total) by mouth daily with supper. Needs appt for further refills 30 tablet 5   rosuvastatin (CRESTOR) 20 MG tablet Take 10 mg by mouth 3 (three) times a week. Andris Flurry and Sat     spironolactone (ALDACTONE) 25 MG tablet Take 12.5 mg by mouth daily.     timolol (BETIMOL) 0.5 % ophthalmic solution Place 1 drop into both eyes every morning.      TRAMADOL HCL PO Take by mouth as needed.     valACYclovir (VALTREX) 1000 MG tablet Take 1,000 mg by mouth. Take 1 tab mouth 3 times daily for 7 days     No current facility-administered medications for this visit.    Allergies:   Patient has no known allergies.   Social History: Social History   Socioeconomic History   Marital status: Widowed    Spouse name: Not on file   Number of children: Not on file   Years of education: Not on file   Highest education level: Not on file  Occupational History   Not on file  Tobacco Use   Smoking status: Never   Smokeless tobacco: Never  Vaping Use   Vaping Use: Never used  Substance and Sexual Activity   Alcohol use: Yes    Comment: 05/2016    OCCASIONAL    Drug use: No   Sexual activity: Not on file  Other Topics Concern   Not on file  Social History Narrative   Not on file   Social Determinants of Health   Financial Resource Strain: Not on file  Food Insecurity: Not on file  Transportation Needs: Not on file  Physical Activity: Not on file  Stress: Not on file  Social Connections: Not on file  Intimate Partner Violence: Not on file    Family History: Family History  Problem Relation Age of Onset   Cancer Mother    Diabetes Mother    Hypertension Mother    Heart disease Father    Cancer Brother    Heart disease Brother      Review of Systems: All other systems reviewed and are otherwise negative except as noted above.  Physical Exam: Vitals:    11/05/20 1138  BP: 118/68  Pulse: 63  SpO2: 96%  Weight: 155 lb (70.3 kg)  Height: 5\' 6"  (1.676 m)     GEN- The patient is elderly and well appearing, alert and oriented x 3 today.   HEENT: normocephalic, atraumatic; sclera clear, conjunctiva pink; hearing intact; oropharynx clear; neck supple  Lungs- Clear to ausculation bilaterally, normal work of breathing.  No wheezes, rales, rhonchi Heart- Regular rate and rhythm, no murmurs, rubs or  gallops  GI- soft, non-tender, non-distended, bowel sounds present  Extremities- no clubbing or cyanosis. No edema MS- no significant deformity or atrophy Skin- warm and dry, no rash or lesion; PPM pocket well healed Psych- euthymic mood, full affect Neuro- strength and sensation are intact  PPM Interrogation- reviewed in detail today,  See PACEART report  EKG:  EKG is ordered today. Personal review of ekg ordered today shows VP at 63 bpm, underlying AF (Permanent)   Recent Labs: No results found for requested labs within last 8760 hours.   Wt Readings from Last 3 Encounters:  11/05/20 155 lb (70.3 kg)  03/20/19 148 lb 3.2 oz (67.2 kg)  12/12/18 152 lb 12.8 oz (69.3 kg)     Other studies Reviewed: Additional studies/ records that were reviewed today include: Previous EP office notes, Previous remote checks, Most recent labwork.   Assessment and Plan:  1. CHB s/p Medtronic PPM  Normal PPM function See Pace Art report No changes today  2. Permanent AF Rate controlled Continue Xarelto for CHA2DS2-VASc of at least 4. Labs today  3. HTN Stable on current regimen  4. Chronic diastolic CHF / Severe TR Followed by Dr. Gala Romney  Volume status stable one exam.  Current medicines are reviewed at length with the patient today.   The patient does not have concerns regarding her medicines.  The following changes were made today:  none  Labs/ tests ordered today include:  Orders Placed This Encounter  Procedures   Basic metabolic panel    CBC   CUP PACEART INCLINIC DEVICE CHECK   EKG 12-Lead    Disposition:   Follow up with Dr. Johney Frame or EP APP in 12 months.     Dustin Flock, PA-C  11/05/2020 12:22 PM  [facility] [address] [place] [address] [phone] (office) [phone] (fax)

## 2020-11-05 ENCOUNTER — Ambulatory Visit (INDEPENDENT_AMBULATORY_CARE_PROVIDER_SITE_OTHER): Payer: Medicare Other | Admitting: Student

## 2020-11-05 ENCOUNTER — Other Ambulatory Visit: Payer: Self-pay

## 2020-11-05 ENCOUNTER — Encounter: Payer: Self-pay | Admitting: Student

## 2020-11-05 VITALS — BP 118/68 | HR 63 | Ht 66.0 in | Wt 155.0 lb

## 2020-11-05 DIAGNOSIS — I5032 Chronic diastolic (congestive) heart failure: Secondary | ICD-10-CM | POA: Diagnosis not present

## 2020-11-05 DIAGNOSIS — I442 Atrioventricular block, complete: Secondary | ICD-10-CM | POA: Diagnosis not present

## 2020-11-05 DIAGNOSIS — I495 Sick sinus syndrome: Secondary | ICD-10-CM | POA: Diagnosis not present

## 2020-11-05 LAB — CUP PACEART INCLINIC DEVICE CHECK
Battery Impedance: 2992 Ohm
Battery Remaining Longevity: 26 mo
Battery Voltage: 2.72 V
Brady Statistic RV Percent Paced: 100 %
Date Time Interrogation Session: 20220816121835
Implantable Lead Implant Date: 20050927
Implantable Lead Implant Date: 20050927
Implantable Lead Location: 753859
Implantable Lead Location: 753860
Implantable Lead Model: 5076
Implantable Lead Model: 5076
Implantable Pulse Generator Implant Date: 20110921
Lead Channel Impedance Value: 466 Ohm
Lead Channel Impedance Value: 67 Ohm
Lead Channel Pacing Threshold Amplitude: 1 V
Lead Channel Pacing Threshold Amplitude: 1.125 V
Lead Channel Pacing Threshold Pulse Width: 0.4 ms
Lead Channel Pacing Threshold Pulse Width: 0.4 ms
Lead Channel Setting Pacing Amplitude: 2.5 V
Lead Channel Setting Pacing Pulse Width: 0.4 ms
Lead Channel Setting Sensing Sensitivity: 2 mV

## 2020-11-05 NOTE — Patient Instructions (Signed)
Medication Instructions:  Your physician recommends that you continue on your current medications as directed. Please refer to the Current Medication list given to you today.  *If you need a refill on your cardiac medications before your next appointment, please call your pharmacy*   Lab Work: TODAY: BMET, CBC  If you have labs (blood work) drawn today and your tests are completely normal, you will receive your results only by: MyChart Message (if you have MyChart) OR A paper copy in the mail If you have any lab test that is abnormal or we need to change your treatment, we will call you to review the results.   Follow-Up: At [facility], you and your health needs are our priority.  As part of our continuing mission to provide you with exceptional heart care, we have created designated Provider Care Teams.  These Care Teams include your primary Cardiologist (physician) and Advanced Practice Providers (APPs -  Physician Assistants and Nurse Practitioners) who all work together to provide you with the care you need, when you need it.   Your next appointment:   1 year(s)  The format for your next appointment:   In Person  Provider:   You may see James Allred, MD or one of the following Advanced Practice Providers on your designated Care Team:   Renee Ursuy, PA-C Michael "Andy" Tillery, PA-C{ 

## 2020-11-06 LAB — CBC
Hematocrit: 38.7 % (ref 34.0–46.6)
Hemoglobin: 12.5 g/dL (ref 11.1–15.9)
MCH: 29.3 pg (ref 26.6–33.0)
MCHC: 32.3 g/dL (ref 31.5–35.7)
MCV: 91 fL (ref 79–97)
Platelets: 239 10*3/uL (ref 150–450)
RBC: 4.26 x10E6/uL (ref 3.77–5.28)
RDW: 13.8 % (ref 11.7–15.4)
WBC: 5.2 10*3/uL (ref 3.4–10.8)

## 2020-11-06 LAB — BASIC METABOLIC PANEL
BUN/Creatinine Ratio: 22 (ref 12–28)
BUN: 14 mg/dL (ref 10–36)
CO2: 24 mmol/L (ref 20–29)
Calcium: 9.7 mg/dL (ref 8.7–10.3)
Chloride: 104 mmol/L (ref 96–106)
Creatinine, Ser: 0.65 mg/dL (ref 0.57–1.00)
Glucose: 107 mg/dL — ABNORMAL HIGH (ref 65–99)
Potassium: 4.3 mmol/L (ref 3.5–5.2)
Sodium: 140 mmol/L (ref 134–144)
eGFR: 82 mL/min/{1.73_m2} (ref >59)

## 2020-11-11 NOTE — Progress Notes (Signed)
Remote pacemaker transmission.   

## 2020-11-18 ENCOUNTER — Ambulatory Visit (INDEPENDENT_AMBULATORY_CARE_PROVIDER_SITE_OTHER): Payer: Medicare Other

## 2020-11-18 DIAGNOSIS — I442 Atrioventricular block, complete: Secondary | ICD-10-CM

## 2020-11-18 LAB — CUP PACEART REMOTE DEVICE CHECK
Battery Impedance: 3138 Ohm
Battery Remaining Longevity: 13 mo
Battery Voltage: 2.69 V
Brady Statistic RV Percent Paced: 100 %
Date Time Interrogation Session: 20220829145328
Implantable Lead Implant Date: 20050927
Implantable Lead Implant Date: 20050927
Implantable Lead Location: 753859
Implantable Lead Location: 753860
Implantable Lead Model: 5076
Implantable Lead Model: 5076
Implantable Pulse Generator Implant Date: 20110921
Lead Channel Impedance Value: 455 Ohm
Lead Channel Impedance Value: 67 Ohm
Lead Channel Pacing Threshold Amplitude: 1.5 V
Lead Channel Pacing Threshold Pulse Width: 0.4 ms
Lead Channel Setting Pacing Amplitude: 3 V
Lead Channel Setting Pacing Pulse Width: 0.76 ms
Lead Channel Setting Sensing Sensitivity: 2 mV

## 2020-11-29 NOTE — Progress Notes (Signed)
Remote pacemaker transmission.   

## 2020-12-19 ENCOUNTER — Ambulatory Visit (INDEPENDENT_AMBULATORY_CARE_PROVIDER_SITE_OTHER): Payer: Medicare Other

## 2020-12-19 DIAGNOSIS — I442 Atrioventricular block, complete: Secondary | ICD-10-CM

## 2020-12-26 ENCOUNTER — Telehealth: Payer: Self-pay

## 2020-12-26 LAB — CUP PACEART REMOTE DEVICE CHECK
Battery Impedance: 3651 Ohm
Battery Voltage: 2.62 V
Brady Statistic RV Percent Paced: 100 %
Date Time Interrogation Session: 20221006105716
Implantable Lead Implant Date: 20050927
Implantable Lead Implant Date: 20050927
Implantable Lead Location: 753859
Implantable Lead Location: 753860
Implantable Lead Model: 5076
Implantable Lead Model: 5076
Implantable Pulse Generator Implant Date: 20110921
Lead Channel Impedance Value: 474 Ohm
Lead Channel Impedance Value: 67 Ohm
Lead Channel Setting Pacing Amplitude: 5 V
Lead Channel Setting Pacing Pulse Width: 1 ms
Lead Channel Setting Sensing Sensitivity: 2 mV

## 2020-12-26 NOTE — Telephone Encounter (Signed)
Scheduled remote reviewed.  RRT reached 9/27   Last OV with A.Tillery, PA on 11/05/20, battery voltage was 2.72V with estimated 2.2 years remaining.  Pt is dependant  on device RV paced down to 45 bpm (symptomatic).  Device programmed VVIR 60, 100% VP.  Now that device has reached RRT it is running in back-up mode VVI 65.    RV output set 5V@ 67ms, no indication as to why.  In-clinic testing on 8/16 , threshold was 1.0V @ 0.29ms.  Adaptive capture is on.    Spoke with pt daughter Beth Santiago (DPR on file).  Advised of device situation.  She indicated concern with rapid depletion of battery.  Advised I would forward to Dr. Johney Frame for review and to expect a phone call back from either Dr. Jenel Lucks nurse of a scheduler.  Pt lives in a facility, daughter will contact there to see if pt experiencing any symptoms

## 2020-12-27 NOTE — Progress Notes (Signed)
Remote pacemaker transmission.   

## 2020-12-27 NOTE — Telephone Encounter (Signed)
Pt daughter left a message for the nurse to give her a call back. Her name is Sherrilee Gilles and her number is [phone].

## 2020-12-27 NOTE — Addendum Note (Signed)
Addended by: Geralyn Flash D on: 12/27/2020 09:58 AM   Modules accepted: Level of Service

## 2020-12-27 NOTE — Telephone Encounter (Signed)
Returned call to daughter. Daughter reporting that patient more tired than usual but doing ok. Battery at RRT with back up mode VVI 65. Patient scheduled to see Dr. Johney Frame Monday 10/10 to discuss gen change. Daughter appreciative of call back.

## 2020-12-30 ENCOUNTER — Ambulatory Visit (INDEPENDENT_AMBULATORY_CARE_PROVIDER_SITE_OTHER): Payer: Medicare Other | Admitting: Internal Medicine

## 2020-12-30 ENCOUNTER — Encounter: Payer: Self-pay | Admitting: *Deleted

## 2020-12-30 ENCOUNTER — Other Ambulatory Visit: Payer: Self-pay

## 2020-12-30 VITALS — BP 122/66 | HR 66 | Ht 66.0 in | Wt 155.0 lb

## 2020-12-30 DIAGNOSIS — I071 Rheumatic tricuspid insufficiency: Secondary | ICD-10-CM

## 2020-12-30 DIAGNOSIS — I4821 Permanent atrial fibrillation: Secondary | ICD-10-CM

## 2020-12-30 DIAGNOSIS — I10 Essential (primary) hypertension: Secondary | ICD-10-CM | POA: Diagnosis not present

## 2020-12-30 DIAGNOSIS — I442 Atrioventricular block, complete: Secondary | ICD-10-CM

## 2020-12-30 DIAGNOSIS — I5032 Chronic diastolic (congestive) heart failure: Secondary | ICD-10-CM

## 2020-12-30 LAB — CBC WITH DIFFERENTIAL/PLATELET
Basophils Absolute: 0.1 10*3/uL (ref 0.0–0.2)
Basos: 1 %
EOS (ABSOLUTE): 0.1 10*3/uL (ref 0.0–0.4)
Eos: 2 %
Hematocrit: 33.8 % — ABNORMAL LOW (ref 34.0–46.6)
Hemoglobin: 11 g/dL — ABNORMAL LOW (ref 11.1–15.9)
Immature Grans (Abs): 0 10*3/uL (ref 0.0–0.1)
Immature Granulocytes: 0 %
Lymphocytes Absolute: 1.4 10*3/uL (ref 0.7–3.1)
Lymphs: 28 %
MCH: 27.8 pg (ref 26.6–33.0)
MCHC: 32.5 g/dL (ref 31.5–35.7)
MCV: 86 fL (ref 79–97)
Monocytes Absolute: 0.6 10*3/uL (ref 0.1–0.9)
Monocytes: 13 %
Neutrophils Absolute: 2.8 10*3/uL (ref 1.4–7.0)
Neutrophils: 56 %
Platelets: 234 10*3/uL (ref 150–450)
RBC: 3.95 x10E6/uL (ref 3.77–5.28)
RDW: 14.2 % (ref 11.7–15.4)
WBC: 4.9 10*3/uL (ref 3.4–10.8)

## 2020-12-30 LAB — BASIC METABOLIC PANEL
BUN/Creatinine Ratio: 27 (ref 12–28)
BUN: 16 mg/dL (ref 10–36)
CO2: 24 mmol/L (ref 20–29)
Calcium: 9.5 mg/dL (ref 8.7–10.3)
Chloride: 102 mmol/L (ref 96–106)
Creatinine, Ser: 0.6 mg/dL (ref 0.57–1.00)
Glucose: 132 mg/dL — ABNORMAL HIGH (ref 70–99)
Potassium: 3.7 mmol/L (ref 3.5–5.2)
Sodium: 138 mmol/L (ref 134–144)
eGFR: 83 mL/min/{1.73_m2} (ref >59)

## 2020-12-30 NOTE — Progress Notes (Signed)
PCP: Adrian Prince, MD Primary Cardiologist: Dr Gala Romney Primary EP:  Dr Johney Frame  Beth Santiago is a 87 y.o. female who presents today for electrophysiology followup.  Since last being seen in our clinic, the patient reports doing reasonably well.  Her pacemaker has reached ERI status.  Today, she denies symptoms of palpitations, chest pain, shortness of breath,  lower extremity edema, dizziness, presyncope, or syncope.  The patient is otherwise without complaint today.   Past Medical History:  Diagnosis Date   Atrial flutter (HCC)    a. Noted during pacemaker implantation 2011, spontaneously terminated.   Biatrial enlargement    severe   CAD (coronary artery disease)    a. Nonobstructive by cath 2005 - 40-50% mid LAD. b. Nonischemic nuc 05/2011.   Carotid artery disease (HCC)    a. 1-50% bilateral (upper end) - March 2011.   CHF (congestive heart failure) (HCC)    Complete heart block (HCC)    a. s/p PPM 2005. b. Gen change to Medtronic by Dr Johney Frame  9/11 for premature ERI 11/2009.   DJD (degenerative joint disease)    GERD (gastroesophageal reflux disease)    Hemoptysis 05/2016   Hyperlipidemia    Hypertension    Hypothyroid    Permanent atrial fibrillation (HCC) 05/2011   Severe tricuspid regurgitation    Past Surgical History:  Procedure Laterality Date   ABDOMINAL SURGERY     twisted bowel   CARDIOVERSION  06/11/2011   Procedure: CARDIOVERSION;  Surgeon: Dolores Patty, MD;  Location: [facility] ENDOSCOPY;  Service: Cardiovascular;  Laterality: N/A;   CARDIOVERSION N/A 01/04/2013   Procedure: CARDIOVERSION;  Surgeon: Lars Masson, MD;  Location: [facility] ENDOSCOPY;  Service: Cardiovascular;  Laterality: N/A;   COLON SURGERY     HEMORROIDECTOMY     PACEMAKER INSERTION  2005, 12/11/09   implanted by Dr Reyes Ivan, generator change 9/11 by JA for premature ERI   TEE WITHOUT CARDIOVERSION  06/11/2011   Procedure: TRANSESOPHAGEAL ECHOCARDIOGRAM (TEE);  Surgeon: Dolores Patty, MD;  Location: [facility] ENDOSCOPY;  Service: Cardiovascular;  Laterality: N/A;   TEE WITHOUT CARDIOVERSION N/A 05/27/2017   Procedure: TRANSESOPHAGEAL ECHOCARDIOGRAM (TEE);  Surgeon: Jake Bathe, MD;  Location: [facility] ENDOSCOPY;  Service: Cardiovascular;  Laterality: N/A;   TONSILLECTOMY      ROS- all systems are reviewed and negative except as per HPI above  Current Outpatient Medications  Medication Sig Dispense Refill   amLODipine (NORVASC) 5 MG tablet Take 1 tablet (5 mg total) by mouth daily. Needs appt for further refills 90 tablet 0   amoxicillin (AMOXIL) 500 MG capsule TAKE 4 CAPSULES BY MOUTH AS NEEDED (1 HOUR PRIOR TO DENTAL WORK) 4 capsule 0   BYSTOLIC 5 MG tablet TAKE 1/2 TABLET BY MOUTH DAILY 45 tablet 3   calcium carbonate (TUMS - DOSED IN MG ELEMENTAL CALCIUM) 500 MG chewable tablet Chew 1 tablet (200 mg of elemental calcium total) by mouth as needed for indigestion or heartburn.     Cholecalciferol (VITAMIN D-3) 125 MCG (5000 UT) TABS Take 5,000 Units by mouth daily.     co-enzyme Q-10 30 MG capsule Take 30 mg by mouth daily.     diclofenac Sodium (VOLTAREN) 1 % GEL      fish oil-omega-3 fatty acids 1000 MG capsule Take 2 g daily by mouth.      furosemide (LASIX) 40 MG tablet Take 20 mg by mouth daily.     ketoconazole (NIZORAL) 2 % cream  levothyroxine (SYNTHROID, LEVOTHROID) 88 MCG tablet Take 88 mcg by mouth daily. On Tuesday 1/2 pill     Misc Natural Products (OSTEO BI-FLEX JOINT SHIELD PO) Take 1 tablet by mouth daily.     Multiple Vitamins-Minerals (CENTRUM SILVER PO) Take 1 tablet by mouth daily.       omeprazole (PRILOSEC) 20 MG capsule Take 20 mg by mouth daily.     polyvinyl alcohol (LIQUIFILM TEARS) 1.4 % ophthalmic solution Place 1.4 drops into both eyes 2 (two) times daily.     potassium chloride SA (KLOR-CON) 20 MEQ tablet TAKE 1 TABLET BY MOUTH  DAILY AND TAKE 1 EXTRA  TABLET WHEN YOU TAKE EXTRA  LASIX 40 tablet 11   rivaroxaban (XARELTO) 20 MG TABS  tablet Take 1 tablet (20 mg total) by mouth daily with supper. Needs appt for further refills 30 tablet 5   rosuvastatin (CRESTOR) 20 MG tablet Take 10 mg by mouth 3 (three) times a week. Andris Flurry and Sat     spironolactone (ALDACTONE) 25 MG tablet Take 12.5 mg by mouth daily.     timolol (BETIMOL) 0.5 % ophthalmic solution Place 1 drop into both eyes every morning.      TRAMADOL HCL PO Take by mouth as needed.     valACYclovir (VALTREX) 1000 MG tablet Take 1,000 mg by mouth. Take 1 tab mouth 3 times daily for 7 days     No current facility-administered medications for this visit.    Physical Exam: Vitals:   12/30/20 1105  BP: 122/66  Pulse: 66  SpO2: 97%  Weight: 155 lb (70.3 kg)  Height: 5\' 6"  (1.676 m)    GEN- The patient is well appearing, alert and oriented x 3 today.   Head- normocephalic, atraumatic Eyes-  Sclera clear, conjunctiva pink Ears- hearing intact Oropharynx- clear Lungs- Clear to ausculation bilaterally, normal work of breathing Chest- pacemaker pocket is well healed Heart- Regular rate and rhythm, no murmurs, rubs or gallops, PMI not laterally displaced GI- soft, NT, ND, + BS Extremities- no clubbing, cyanosis, or edema  Pacemaker interrogation- reviewed in detail today,  See PACEART report  ekg tracing ordered today is personally reviewed and shows afib, V paced  Assessment and Plan:  1. Symptomatic complete heart block Normal pacemaker function but has reached ERI See Pace Art report No changes today she is device dependant today Risks, benefits, and alternatives to PPM pulse generator replacement were discussed in detail today.  The patient understands that risks include but are not limited to bleeding, infection, pneumothorax, perforation, tamponade, vascular damage, renal failure, MI, stroke, death, damage to his existing leads, and lead dislodgement and wishes to proceed.  We will therefore schedule the procedure at the next available time.  Her  RV lead is from 12/18/2003.  This lead appears to be functioning normally with threshold today of 1V @ 0.4 msec.    2. Permanent afib Rate controlled On xarelto for stroke prevention Chad2vasc score is 4. Hold xarelto 24 hours prior to pacemaker generator change  3. HTn Stable No change required today  4. Chronic diastolic dysfunction/ severe TR Clinically stable No changes  Risks, benefits and potential toxicities for medications prescribed and/or refilled reviewed with patient today.   12/20/2003 MD, [facility] 12/30/2020 11:15 AM

## 2020-12-30 NOTE — Patient Instructions (Addendum)
Medication Instructions:  Your physician recommends that you continue on your current medications as directed. Please refer to the Current Medication list given to you today. *If you need a refill on your cardiac medications before your next appointment, please call your pharmacy*  Lab Work: CBC, BMP If you have labs (blood work) drawn today and your tests are completely normal, you will receive your results only by: MyChart Message (if you have MyChart) OR A paper copy in the mail If you have any lab test that is abnormal or we need to change your treatment, we will call you to review the results.  Testing/Procedures: None.  Follow-Up: At [facility], you and your health needs are our priority.  As part of our continuing mission to provide you with exceptional heart care, we have created designated Provider Care Teams.  These Care Teams include your primary Cardiologist (physician) and Advanced Practice Providers (APPs -  Physician Assistants and Nurse Practitioners) who all work together to provide you with the care you need, when you need it.  Remote monitoring is used to monitor your Pacemaker from home. This monitoring reduces the number of office visits required to check your device to one time per year. It allows [place] to keep an eye on the functioning of your device to ensure it is working properly. You are scheduled for a device check from home on 01/15/21. You may send your transmission at any time that day. If you have a wireless device, the transmission will be sent automatically. After your physician reviews your transmission, you will receive a postcard with your next transmission date.  We recommend signing up for the patient portal called "MyChart".  Sign up information is provided on this After Visit Summary.  MyChart is used to connect with patients for Virtual Visits (Telemedicine).  Patients are able to view lab/test results, encounter notes, upcoming appointments, etc.   Non-urgent messages can be sent to your provider as well.   To learn more about what you can do with MyChart, go to [email or web address].    Any Other Special Instructions Will Be Listed Below (If Applicable).  Pacemaker Battery Change A pacemaker battery usually lasts 5-15 years (6-7 years on average). A few times a year, you may be asked to visit your health care provider to have a full evaluation of your pacemaker. When the battery is low, your pacemaker will be completely replaced. Most often, this procedure is simpler than the first surgery because the wires (leads) that connect the pacemaker to the heart are already in place. There are many things that affect how long a pacemaker battery will last, including: The age of the pacemaker. The number of leads you have(1, 2, or 3). The use or workload of the pacemaker. If the pacemaker is helping the heart more often, the battery will not last as long. Power (voltage) settings. Tell a health care provider about: Any allergies you have. All medicines you are taking, including vitamins, herbs, eye drops, creams, and over-the-counter medicines. Any problems you or family members have had with anesthetic medicines. Any blood disorders you have. Any surgeries you have had, especially any surgeries you have had since your last pacemaker was placed. Any medical conditions you have. Whether you are pregnant or may be pregnant. What are the risks? Generally, this is a safe procedure. However, problems may occur, including: Bleeding. Infection. Nerve damage. Allergic reaction to medicines. Damage to the leads that go to the heart. What happens before the procedure? Staying  hydrated Follow instructions from your health care provider about hydration, which may include: Up to 2 hours before the procedure - you may continue to drink clear liquids, such as water, clear fruit juice, black coffee, and plain tea. Eating and drinking  restrictions Follow instructions from your health care provider about eating and drinking restrictions, which may include: 8 hours before the procedure - stop eating heavy meals or foods, such as meat, fried foods, or fatty foods. 6 hours before the procedure - stop eating light meals or foods, such as toast or cereal. 6 hours before the procedure - stop drinking milk or drinks that contain milk. 2 hours before the procedure - stop drinking clear liquids. Medicines Ask your health care provider about: Changing or stopping your regular medicines. This is especially important if you are taking diabetes medicines or blood thinners. Taking medicines such as aspirin and ibuprofen. These medicines can thin your blood. Do not take these medicines unless your health care provider tells you to take them. Taking over-the-counter medicines, vitamins, herbs, and supplements. General instructions Ask your health care provider what steps will be taken to help prevent infection. These may include: Removing hair at the surgery site. Washing skin with a germ-killing soap. Receiving antibiotic medicine. Plan to have someone take you home from the hospital or clinic. If you will be going home right after the procedure, plan to have someone with you for 24 hours. What happens during the procedure? An IV will be inserted into one of your veins. You will be given one or more of the following: A medicine to help you relax (sedative). A medicine to numb the area where the pacemaker is located (local anesthetic). Your health care provider will make an incision to reopen the pocket holding the pacemaker. The old pacemaker will be disconnected from the leads. The leads will be tested. If needed, the leads will be replaced. If the leads are functioning properly, the new pacemaker will be connected to the existing leads. A heart monitor and a pacemaker programmer will be used to make sure that the newly implanted  pacemaker is working properly. The incision site will be closed with stitches (sutures), adhesive strips, or skin glue. A bandage (dressing) will be placed over the pacemaker site. The procedure may vary among health care providers and hospitals. What happens after the procedure? Your blood pressure, heart rate, breathing rate, and blood oxygen level will be monitored until you leave the hospital or clinic. You may be given antibiotics. Your health care provider will tell you when your pacemaker will need to be tested again, or when to return to the office for removal of the dressing and sutures. If you were given a sedative during the procedure, it can affect you for several hours. Do not drive or operate machinery until your health care provider says that it is safe. You will be given a pacemaker identification card. This card lists the implant date, device model, and manufacturer of your pacemaker. Summary A pacemaker battery usually lasts 5-15 years (6-7 years on average). When the battery is low, your pacemaker will need to be replaced. Most often, this procedure is simpler than the first surgery because the wires (leads) that connect the pacemaker to the heart are already in place. Risks of this procedure include bleeding, infection, and allergic reactions to medicines. This information is not intended to replace advice given to you by your health care provider. Make sure you discuss any questions you have with your  health care provider. Document Revised: 02/09/2019 Document Reviewed: 02/09/2019 Elsevier Patient Education  2022 [employer].

## 2020-12-30 NOTE — H&P (View-Only) (Signed)
PCP: Adrian Prince, MD Primary Cardiologist: Dr Gala Romney Primary EP:  Dr Johney Frame  Beth Santiago is a 87 y.o. female who presents today for electrophysiology followup.  Since last being seen in our clinic, the patient reports doing reasonably well.  Her pacemaker has reached ERI status.  Today, she denies symptoms of palpitations, chest pain, shortness of breath,  lower extremity edema, dizziness, presyncope, or syncope.  The patient is otherwise without complaint today.   Past Medical History:  Diagnosis Date   Atrial flutter (HCC)    a. Noted during pacemaker implantation 2011, spontaneously terminated.   Biatrial enlargement    severe   CAD (coronary artery disease)    a. Nonobstructive by cath 2005 - 40-50% mid LAD. b. Nonischemic nuc 05/2011.   Carotid artery disease (HCC)    a. 1-50% bilateral (upper end) - March 2011.   CHF (congestive heart failure) (HCC)    Complete heart block (HCC)    a. s/p PPM 2005. b. Gen change to Medtronic by Dr Johney Frame  9/11 for premature ERI 11/2009.   DJD (degenerative joint disease)    GERD (gastroesophageal reflux disease)    Hemoptysis 05/2016   Hyperlipidemia    Hypertension    Hypothyroid    Permanent atrial fibrillation (HCC) 05/2011   Severe tricuspid regurgitation    Past Surgical History:  Procedure Laterality Date   ABDOMINAL SURGERY     twisted bowel   CARDIOVERSION  06/11/2011   Procedure: CARDIOVERSION;  Surgeon: Dolores Patty, MD;  Location: [facility] ENDOSCOPY;  Service: Cardiovascular;  Laterality: N/A;   CARDIOVERSION N/A 01/04/2013   Procedure: CARDIOVERSION;  Surgeon: Lars Masson, MD;  Location: [facility] ENDOSCOPY;  Service: Cardiovascular;  Laterality: N/A;   COLON SURGERY     HEMORROIDECTOMY     PACEMAKER INSERTION  2005, 12/11/09   implanted by Dr Reyes Ivan, generator change 9/11 by JA for premature ERI   TEE WITHOUT CARDIOVERSION  06/11/2011   Procedure: TRANSESOPHAGEAL ECHOCARDIOGRAM (TEE);  Surgeon: Dolores Patty, MD;  Location: [facility] ENDOSCOPY;  Service: Cardiovascular;  Laterality: N/A;   TEE WITHOUT CARDIOVERSION N/A 05/27/2017   Procedure: TRANSESOPHAGEAL ECHOCARDIOGRAM (TEE);  Surgeon: Jake Bathe, MD;  Location: [facility] ENDOSCOPY;  Service: Cardiovascular;  Laterality: N/A;   TONSILLECTOMY      ROS- all systems are reviewed and negative except as per HPI above  Current Outpatient Medications  Medication Sig Dispense Refill   amLODipine (NORVASC) 5 MG tablet Take 1 tablet (5 mg total) by mouth daily. Needs appt for further refills 90 tablet 0   amoxicillin (AMOXIL) 500 MG capsule TAKE 4 CAPSULES BY MOUTH AS NEEDED (1 HOUR PRIOR TO DENTAL WORK) 4 capsule 0   BYSTOLIC 5 MG tablet TAKE 1/2 TABLET BY MOUTH DAILY 45 tablet 3   calcium carbonate (TUMS - DOSED IN MG ELEMENTAL CALCIUM) 500 MG chewable tablet Chew 1 tablet (200 mg of elemental calcium total) by mouth as needed for indigestion or heartburn.     Cholecalciferol (VITAMIN D-3) 125 MCG (5000 UT) TABS Take 5,000 Units by mouth daily.     co-enzyme Q-10 30 MG capsule Take 30 mg by mouth daily.     diclofenac Sodium (VOLTAREN) 1 % GEL      fish oil-omega-3 fatty acids 1000 MG capsule Take 2 g daily by mouth.      furosemide (LASIX) 40 MG tablet Take 20 mg by mouth daily.     ketoconazole (NIZORAL) 2 % cream  levothyroxine (SYNTHROID, LEVOTHROID) 88 MCG tablet Take 88 mcg by mouth daily. On Tuesday 1/2 pill     Misc Natural Products (OSTEO BI-FLEX JOINT SHIELD PO) Take 1 tablet by mouth daily.     Multiple Vitamins-Minerals (CENTRUM SILVER PO) Take 1 tablet by mouth daily.       omeprazole (PRILOSEC) 20 MG capsule Take 20 mg by mouth daily.     polyvinyl alcohol (LIQUIFILM TEARS) 1.4 % ophthalmic solution Place 1.4 drops into both eyes 2 (two) times daily.     potassium chloride SA (KLOR-CON) 20 MEQ tablet TAKE 1 TABLET BY MOUTH  DAILY AND TAKE 1 EXTRA  TABLET WHEN YOU TAKE EXTRA  LASIX 40 tablet 11   rivaroxaban (XARELTO) 20 MG TABS  tablet Take 1 tablet (20 mg total) by mouth daily with supper. Needs appt for further refills 30 tablet 5   rosuvastatin (CRESTOR) 20 MG tablet Take 10 mg by mouth 3 (three) times a week. Andris Flurry and Sat     spironolactone (ALDACTONE) 25 MG tablet Take 12.5 mg by mouth daily.     timolol (BETIMOL) 0.5 % ophthalmic solution Place 1 drop into both eyes every morning.      TRAMADOL HCL PO Take by mouth as needed.     valACYclovir (VALTREX) 1000 MG tablet Take 1,000 mg by mouth. Take 1 tab mouth 3 times daily for 7 days     No current facility-administered medications for this visit.    Physical Exam: Vitals:   12/30/20 1105  BP: 122/66  Pulse: 66  SpO2: 97%  Weight: 155 lb (70.3 kg)  Height: 5\' 6"  (1.676 m)    GEN- The patient is well appearing, alert and oriented x 3 today.   Head- normocephalic, atraumatic Eyes-  Sclera clear, conjunctiva pink Ears- hearing intact Oropharynx- clear Lungs- Clear to ausculation bilaterally, normal work of breathing Chest- pacemaker pocket is well healed Heart- Regular rate and rhythm, no murmurs, rubs or gallops, PMI not laterally displaced GI- soft, NT, ND, + BS Extremities- no clubbing, cyanosis, or edema  Pacemaker interrogation- reviewed in detail today,  See PACEART report  ekg tracing ordered today is personally reviewed and shows afib, V paced  Assessment and Plan:  1. Symptomatic complete heart block Normal pacemaker function but has reached ERI See Pace Art report No changes today she is device dependant today Risks, benefits, and alternatives to PPM pulse generator replacement were discussed in detail today.  The patient understands that risks include but are not limited to bleeding, infection, pneumothorax, perforation, tamponade, vascular damage, renal failure, MI, stroke, death, damage to his existing leads, and lead dislodgement and wishes to proceed.  We will therefore schedule the procedure at the next available time.  Her  RV lead is from 12/18/2003.  This lead appears to be functioning normally with threshold today of 1V @ 0.4 msec.    2. Permanent afib Rate controlled On xarelto for stroke prevention Chad2vasc score is 4. Hold xarelto 24 hours prior to pacemaker generator change  3. HTn Stable No change required today  4. Chronic diastolic dysfunction/ severe TR Clinically stable No changes  Risks, benefits and potential toxicities for medications prescribed and/or refilled reviewed with patient today.   12/20/2003 MD, [facility] 12/30/2020 11:15 AM

## 2021-01-07 NOTE — Pre-Procedure Instructions (Signed)
Spoke with patient's daughter, [name].  She states [facility] will be bringing patient to the hospital.  She will be meeting patient here.  Spoke with Nurse Gardiner Rhyme at the nursing home to be sure of the instructions.  They are to have here here at 1:30.   Nothing to eat or drink after 7am No meds AM of procedure after 7am Responsible person to drive you home and stay with you for 24 hrs Wash with special soap night before and morning of procedure If on anti-coagulant drug instructions Xarelto- on hold today

## 2021-01-08 ENCOUNTER — Encounter (HOSPITAL_COMMUNITY)
Admission: RE | Disposition: A | Discharge status: Home / Self Care | Payer: Self-pay | Source: Ambulatory Visit | Attending: Internal Medicine

## 2021-01-08 ENCOUNTER — Ambulatory Visit (HOSPITAL_COMMUNITY)
Admission: RE | Admit: 2021-01-08 | Discharge: 2021-01-08 | Disposition: A | Discharge status: Home / Self Care | Payer: Medicare Other | Source: Ambulatory Visit | Attending: Internal Medicine | Admitting: Internal Medicine

## 2021-01-08 ENCOUNTER — Other Ambulatory Visit: Payer: Self-pay

## 2021-01-08 DIAGNOSIS — I442 Atrioventricular block, complete: Secondary | ICD-10-CM

## 2021-01-08 DIAGNOSIS — Z7901 Long term (current) use of anticoagulants: Secondary | ICD-10-CM | POA: Insufficient documentation

## 2021-01-08 DIAGNOSIS — I509 Heart failure, unspecified: Secondary | ICD-10-CM | POA: Diagnosis not present

## 2021-01-08 DIAGNOSIS — Z79899 Other long term (current) drug therapy: Secondary | ICD-10-CM | POA: Insufficient documentation

## 2021-01-08 DIAGNOSIS — I2510 Atherosclerotic heart disease of native coronary artery without angina pectoris: Secondary | ICD-10-CM | POA: Insufficient documentation

## 2021-01-08 DIAGNOSIS — I110 Hypertensive heart disease with heart failure: Secondary | ICD-10-CM | POA: Diagnosis not present

## 2021-01-08 DIAGNOSIS — E785 Hyperlipidemia, unspecified: Secondary | ICD-10-CM | POA: Diagnosis not present

## 2021-01-08 DIAGNOSIS — Z45010 Encounter for checking and testing of cardiac pacemaker pulse generator [battery]: Secondary | ICD-10-CM | POA: Diagnosis present

## 2021-01-08 DIAGNOSIS — I4821 Permanent atrial fibrillation: Secondary | ICD-10-CM | POA: Insufficient documentation

## 2021-01-08 HISTORY — PX: PPM GENERATOR CHANGEOUT: EP1233

## 2021-01-08 SURGERY — PPM GENERATOR CHANGEOUT

## 2021-01-08 MED ORDER — SODIUM CHLORIDE 0.9 % IV SOLN
80.0000 mg | INTRAVENOUS | Status: AC
  Administered 2021-01-08: 80 mg

## 2021-01-08 MED ORDER — ACETAMINOPHEN 325 MG PO TABS
325.0000 mg | ORAL_TABLET | ORAL | Status: DC | PRN
  Filled 2021-01-08: qty 2

## 2021-01-08 MED ORDER — LIDOCAINE HCL (PF) 1 % IJ SOLN
INTRAMUSCULAR | Status: DC | PRN
  Administered 2021-01-08: 60 mL via INTRADERMAL

## 2021-01-08 MED ORDER — SODIUM CHLORIDE 0.9 % IV SOLN: 250.0000 mL | INTRAVENOUS | Status: DC | PRN

## 2021-01-08 MED ORDER — CHLORHEXIDINE GLUCONATE 4 % EX LIQD: 4.0000 "application " | Freq: Once | CUTANEOUS | Status: DC

## 2021-01-08 MED ORDER — SODIUM CHLORIDE 0.9 % IV SOLN
INTRAVENOUS | Status: AC
  Filled 2021-01-08: qty 2

## 2021-01-08 MED ORDER — CEFAZOLIN SODIUM-DEXTROSE 2-4 GM/100ML-% IV SOLN
2.0000 g | INTRAVENOUS | Status: AC
  Administered 2021-01-08: 2 g via INTRAVENOUS

## 2021-01-08 MED ORDER — ONDANSETRON HCL 4 MG/2ML IJ SOLN: 4.0000 mg | Freq: Four times a day (QID) | INTRAMUSCULAR | Status: DC | PRN

## 2021-01-08 MED ORDER — SODIUM CHLORIDE 0.9 % IV SOLN: INTRAVENOUS | Status: DC

## 2021-01-08 MED ORDER — SODIUM CHLORIDE 0.9% FLUSH: 3.0000 mL | Freq: Two times a day (BID) | INTRAVENOUS | Status: DC

## 2021-01-08 MED ORDER — POVIDONE-IODINE 10 % EX SWAB
2.0000 "application " | Freq: Once | CUTANEOUS | Status: AC
  Administered 2021-01-08: 2 via TOPICAL

## 2021-01-08 MED ORDER — CEFAZOLIN SODIUM-DEXTROSE 2-4 GM/100ML-% IV SOLN
INTRAVENOUS | Status: AC
  Filled 2021-01-08: qty 100

## 2021-01-08 MED ORDER — SODIUM CHLORIDE 0.9% FLUSH: 3.0000 mL | INTRAVENOUS | Status: DC | PRN

## 2021-01-08 MED ORDER — RIVAROXABAN 15 MG PO TABS: 15.0000 mg | ORAL_TABLET | Freq: Every day | ORAL | 2 refills | Status: DC

## 2021-01-08 MED ORDER — LIDOCAINE HCL 1 % IJ SOLN
INTRAMUSCULAR | Status: AC
  Filled 2021-01-08: qty 60

## 2021-01-08 SURGICAL SUPPLY — 7 items
CABLE SURGICAL S-101-97-12 (CABLE) ×2 IMPLANT
IPG PACE AZUR XT DR MRI W1DR01 (Pacemaker) IMPLANT
PACE AZURE XT DR MRI W1DR01 (Pacemaker) ×2 IMPLANT
PAD PRO RADIOLUCENT 2001M-C (PAD) ×2 IMPLANT
POUCH AIGIS-R ANTIBACT PPM (Mesh General) ×2 IMPLANT
POUCH AIGIS-R ANTIBACT PPM MED (Mesh General) IMPLANT
TRAY PACEMAKER INSERTION (PACKS) ×2 IMPLANT

## 2021-01-08 NOTE — Interval H&P Note (Signed)
History and Physical Interval Note:  01/08/2021 2:37 PM  Beth Santiago  has presented today for surgery, with the diagnosis of eri.  The various methods of treatment have been discussed with the patient and family. After consideration of risks, benefits and other options for treatment, the patient has consented to  Procedure(s): PPM GENERATOR CHANGEOUT (N/A) as a surgical intervention.  The patient's history has been reviewed, patient examined, no change in status, stable for surgery.  I have reviewed the patient's chart and labs.  Questions were answered to the patient's satisfaction.     [name]

## 2021-01-08 NOTE — Discharge Instructions (Addendum)
Resume xarelto on 01/10/21.  Please note new dose of xarelto 15mg  daily.

## 2021-01-09 ENCOUNTER — Encounter (HOSPITAL_COMMUNITY): Payer: Self-pay | Admitting: Internal Medicine

## 2021-01-14 ENCOUNTER — Encounter: Payer: Self-pay | Admitting: Internal Medicine

## 2021-01-14 ENCOUNTER — Telehealth: Payer: Self-pay | Admitting: Internal Medicine

## 2021-01-14 NOTE — Telephone Encounter (Signed)
Spoke with Carlyon Shadow, nurse manager witgh [facility] who states patient was previously complaining of pain at gen change site upon inspiration. Facility NP ordered a stat chest x-ray which has not been completed. Requested nurse manager to return to patients room to send manual transmission. While device RN on phone, patient was asked if she still had pain upon inspiration and patient reports no pain at all. Patient is alert and oriented x 4. Patient also states that when she was having pain it was not internal but at incision site. Steri-strips remain intact per nurse manager and site without s/s of infection or edema. Transmission received. WNL. Presenting rhythm VP at 61. No episodes noted. Nurse manager instructed to utilize ice pack for discomfort as needed and may utilize tylenol if previously prescribed by NP. Nurse manager is provided device clinic fax and will send x-ray results to be scanned into patient's chart as soon as received. ED precautions reviewed. Nurse manager thankful for follow up and education.

## 2021-01-14 NOTE — Telephone Encounter (Signed)
Aja from [facility] states the patient is complaining about pain at her pacer site. They gave her tylenol but she still in pain. They would like to know what to do about it. I let them speak with Portia, rn.

## 2021-01-14 NOTE — Telephone Encounter (Signed)
  Beth Santiago ([name]) with [facility] calling, she said pt is having pain on her surgery site and wanted to know what they need to do

## 2021-01-14 NOTE — Telephone Encounter (Signed)
Error

## 2021-01-22 ENCOUNTER — Other Ambulatory Visit: Payer: Self-pay

## 2021-01-22 ENCOUNTER — Ambulatory Visit (INDEPENDENT_AMBULATORY_CARE_PROVIDER_SITE_OTHER): Payer: Medicare Other

## 2021-01-22 DIAGNOSIS — I442 Atrioventricular block, complete: Secondary | ICD-10-CM

## 2021-01-22 LAB — CUP PACEART INCLINIC DEVICE CHECK
Battery Remaining Longevity: 139 mo
Battery Voltage: 3.22 V
Brady Statistic AP VP Percent: 0 %
Brady Statistic AP VS Percent: 0 %
Brady Statistic AS VP Percent: 100 %
Brady Statistic AS VS Percent: 0 %
Brady Statistic RA Percent Paced: 0 %
Brady Statistic RV Percent Paced: 100 %
Date Time Interrogation Session: 20221102112539
Implantable Lead Implant Date: 20050927
Implantable Lead Implant Date: 20050927
Implantable Lead Location: 753859
Implantable Lead Location: 753860
Implantable Lead Model: 5076
Implantable Lead Model: 5076
Implantable Pulse Generator Implant Date: 20221019
Lead Channel Impedance Value: 304 Ohm
Lead Channel Impedance Value: 323 Ohm
Lead Channel Impedance Value: 380 Ohm
Lead Channel Impedance Value: 399 Ohm
Lead Channel Pacing Threshold Amplitude: 1 V
Lead Channel Pacing Threshold Pulse Width: 0.7 ms
Lead Channel Setting Pacing Amplitude: 2 V
Lead Channel Setting Pacing Pulse Width: 0.7 ms
Lead Channel Setting Sensing Sensitivity: 8 mV

## 2021-01-22 NOTE — Patient Instructions (Signed)

## 2021-01-22 NOTE — Progress Notes (Signed)
Wound check appointment s/p gen change 01/08/21. Steri-strips removed. Wound without redness or edema. Incision edges approximated, wound well healed. Normal device function. Thresholds, sensing, and impedances consistent with implant measurements. Device programmed at chronic lead settings. Histogram distribution appropriate for patient and level of activity. No mode switches or high ventricular rates noted. Patient educated about wound care, arm mobility, lifting restrictions. Patient enrolled in remote monitoring with next transmission scheduled 04/10/20. Patient's daughter will be called to schedule 91 day post gen change with Dr. Johney Frame.

## 2021-01-29 ENCOUNTER — Encounter: Payer: Self-pay | Admitting: Gastroenterology

## 2021-01-31 ENCOUNTER — Encounter (HOSPITAL_COMMUNITY): Payer: Self-pay

## 2021-01-31 ENCOUNTER — Other Ambulatory Visit: Payer: Self-pay

## 2021-01-31 ENCOUNTER — Inpatient Hospital Stay (HOSPITAL_COMMUNITY)
Admission: EM | Admit: 2021-01-31 | Discharge: 2021-02-02 | DRG: 378 | Disposition: A | Discharge status: Skilled Nursing Facility | Payer: Medicare Other | Attending: Family Medicine | Admitting: Family Medicine

## 2021-01-31 DIAGNOSIS — Z79899 Other long term (current) drug therapy: Secondary | ICD-10-CM

## 2021-01-31 DIAGNOSIS — E785 Hyperlipidemia, unspecified: Secondary | ICD-10-CM | POA: Diagnosis present

## 2021-01-31 DIAGNOSIS — I5032 Chronic diastolic (congestive) heart failure: Secondary | ICD-10-CM | POA: Diagnosis present

## 2021-01-31 DIAGNOSIS — I2510 Atherosclerotic heart disease of native coronary artery without angina pectoris: Secondary | ICD-10-CM | POA: Diagnosis present

## 2021-01-31 DIAGNOSIS — I459 Conduction disorder, unspecified: Secondary | ICD-10-CM | POA: Diagnosis present

## 2021-01-31 DIAGNOSIS — Z7901 Long term (current) use of anticoagulants: Secondary | ICD-10-CM | POA: Diagnosis not present

## 2021-01-31 DIAGNOSIS — Z950 Presence of cardiac pacemaker: Secondary | ICD-10-CM | POA: Diagnosis not present

## 2021-01-31 DIAGNOSIS — K922 Gastrointestinal hemorrhage, unspecified: Principal | ICD-10-CM | POA: Diagnosis present

## 2021-01-31 DIAGNOSIS — K920 Hematemesis: Secondary | ICD-10-CM

## 2021-01-31 DIAGNOSIS — Z20822 Contact with and (suspected) exposure to covid-19: Secondary | ICD-10-CM | POA: Diagnosis present

## 2021-01-31 DIAGNOSIS — K317 Polyp of stomach and duodenum: Secondary | ICD-10-CM | POA: Diagnosis present

## 2021-01-31 DIAGNOSIS — D62 Acute posthemorrhagic anemia: Secondary | ICD-10-CM | POA: Diagnosis present

## 2021-01-31 DIAGNOSIS — K449 Diaphragmatic hernia without obstruction or gangrene: Secondary | ICD-10-CM | POA: Diagnosis present

## 2021-01-31 DIAGNOSIS — K219 Gastro-esophageal reflux disease without esophagitis: Secondary | ICD-10-CM | POA: Diagnosis present

## 2021-01-31 DIAGNOSIS — E039 Hypothyroidism, unspecified: Secondary | ICD-10-CM | POA: Diagnosis present

## 2021-01-31 DIAGNOSIS — K209 Esophagitis, unspecified without bleeding: Secondary | ICD-10-CM

## 2021-01-31 DIAGNOSIS — I4892 Unspecified atrial flutter: Secondary | ICD-10-CM | POA: Diagnosis present

## 2021-01-31 DIAGNOSIS — Z8249 Family history of ischemic heart disease and other diseases of the circulatory system: Secondary | ICD-10-CM

## 2021-01-31 DIAGNOSIS — Z79890 Hormone replacement therapy (postmenopausal): Secondary | ICD-10-CM

## 2021-01-31 DIAGNOSIS — I442 Atrioventricular block, complete: Secondary | ICD-10-CM | POA: Diagnosis present

## 2021-01-31 DIAGNOSIS — Z66 Do not resuscitate: Secondary | ICD-10-CM | POA: Diagnosis present

## 2021-01-31 DIAGNOSIS — I110 Hypertensive heart disease with heart failure: Secondary | ICD-10-CM | POA: Diagnosis present

## 2021-01-31 DIAGNOSIS — I4821 Permanent atrial fibrillation: Secondary | ICD-10-CM

## 2021-01-31 DIAGNOSIS — I10 Essential (primary) hypertension: Secondary | ICD-10-CM

## 2021-01-31 DIAGNOSIS — I4891 Unspecified atrial fibrillation: Secondary | ICD-10-CM | POA: Diagnosis present

## 2021-01-31 DIAGNOSIS — K2970 Gastritis, unspecified, without bleeding: Secondary | ICD-10-CM

## 2021-01-31 LAB — CBC WITH DIFFERENTIAL/PLATELET
Abs Immature Granulocytes: 0.07 10*3/uL (ref 0.00–0.07)
Basophils Absolute: 0.1 10*3/uL (ref 0.0–0.1)
Basophils Relative: 1 %
Eosinophils Absolute: 0.2 10*3/uL (ref 0.0–0.5)
Eosinophils Relative: 2 %
HCT: 27.4 % — ABNORMAL LOW (ref 36.0–46.0)
Hemoglobin: 8.5 g/dL — ABNORMAL LOW (ref 12.0–15.0)
Immature Granulocytes: 1 %
Lymphocytes Relative: 10 %
Lymphs Abs: 1.1 10*3/uL (ref 0.7–4.0)
MCH: 28.2 pg (ref 26.0–34.0)
MCHC: 31 g/dL (ref 30.0–36.0)
MCV: 91 fL (ref 80.0–100.0)
Monocytes Absolute: 0.9 10*3/uL (ref 0.1–1.0)
Monocytes Relative: 8 %
Neutro Abs: 9.6 10*3/uL — ABNORMAL HIGH (ref 1.7–7.7)
Neutrophils Relative %: 78 %
Platelets: 199 10*3/uL (ref 150–400)
RBC: 3.01 MIL/uL — ABNORMAL LOW (ref 3.87–5.11)
RDW: 17.8 % — ABNORMAL HIGH (ref 11.5–15.5)
WBC: 11.9 10*3/uL — ABNORMAL HIGH (ref 4.0–10.5)
nRBC: 0 % (ref 0.0–0.2)

## 2021-01-31 LAB — COMPREHENSIVE METABOLIC PANEL
ALT: 8 U/L (ref 0–44)
AST: 20 U/L (ref 15–41)
Albumin: 2.9 g/dL — ABNORMAL LOW (ref 3.5–5.0)
Alkaline Phosphatase: 55 U/L (ref 38–126)
Anion gap: 6 (ref 5–15)
BUN: 18 mg/dL (ref 8–23)
CO2: 21 mmol/L — ABNORMAL LOW (ref 22–32)
Calcium: 8.9 mg/dL (ref 8.9–10.3)
Chloride: 113 mmol/L — ABNORMAL HIGH (ref 98–111)
Creatinine, Ser: 0.61 mg/dL (ref 0.44–1.00)
GFR, Estimated: >60 mL/min (ref >60)
Glucose, Bld: 116 mg/dL — ABNORMAL HIGH (ref 70–99)
Potassium: 4 mmol/L (ref 3.5–5.1)
Sodium: 140 mmol/L (ref 135–145)
Total Bilirubin: 0.6 mg/dL (ref 0.3–1.2)
Total Protein: 5.6 g/dL — ABNORMAL LOW (ref 6.5–8.1)

## 2021-01-31 LAB — TYPE AND SCREEN
ABO/RH(D): B POS
Antibody Screen: NEGATIVE

## 2021-01-31 LAB — RESP PANEL BY RT-PCR (FLU A&B, COVID) ARPGX2
Influenza A by PCR: NEGATIVE
Influenza B by PCR: NEGATIVE
SARS Coronavirus 2 by RT PCR: NEGATIVE

## 2021-01-31 LAB — I-STAT CHEM 8, ED
BUN: 18 mg/dL (ref 8–23)
Calcium, Ion: 1.2 mmol/L (ref 1.15–1.40)
Chloride: 110 mmol/L (ref 98–111)
Creatinine, Ser: 0.6 mg/dL (ref 0.44–1.00)
Glucose, Bld: 110 mg/dL — ABNORMAL HIGH (ref 70–99)
HCT: 28 % — ABNORMAL LOW (ref 36.0–46.0)
Hemoglobin: 9.5 g/dL — ABNORMAL LOW (ref 12.0–15.0)
Potassium: 3.7 mmol/L (ref 3.5–5.1)
Sodium: 142 mmol/L (ref 135–145)
TCO2: 20 mmol/L — ABNORMAL LOW (ref 22–32)

## 2021-01-31 LAB — POC OCCULT BLOOD, ED: Fecal Occult Bld: POSITIVE — AB

## 2021-01-31 LAB — PROTIME-INR
INR: 1.5 — ABNORMAL HIGH (ref 0.8–1.2)
Prothrombin Time: 17.9 seconds — ABNORMAL HIGH (ref 11.4–15.2)

## 2021-01-31 LAB — APTT: aPTT: 37 seconds — ABNORMAL HIGH (ref 24–36)

## 2021-01-31 MED ORDER — ONDANSETRON HCL 4 MG/2ML IJ SOLN: 4.0000 mg | Freq: Four times a day (QID) | INTRAMUSCULAR | Status: DC | PRN

## 2021-01-31 MED ORDER — ACETAMINOPHEN 325 MG PO TABS
650.0000 mg | ORAL_TABLET | Freq: Four times a day (QID) | ORAL | Status: DC | PRN
  Administered 2021-02-01: 650 mg via ORAL
  Filled 2021-01-31: qty 2

## 2021-01-31 MED ORDER — LEVOTHYROXINE SODIUM 88 MCG PO TABS
88.0000 ug | ORAL_TABLET | ORAL | Status: DC
  Administered 2021-02-01 – 2021-02-02 (×2): 88 ug via ORAL
  Filled 2021-01-31 (×3): qty 1

## 2021-01-31 MED ORDER — SODIUM CHLORIDE 0.9% FLUSH
3.0000 mL | Freq: Two times a day (BID) | INTRAVENOUS | Status: DC
  Administered 2021-01-31 – 2021-02-02 (×4): 3 mL via INTRAVENOUS

## 2021-01-31 MED ORDER — TIMOLOL MALEATE 0.5 % OP SOLN
1.0000 [drp] | Freq: Every day | OPHTHALMIC | Status: DC
  Administered 2021-02-01 – 2021-02-02 (×2): 1 [drp] via OPHTHALMIC
  Filled 2021-01-31: qty 5

## 2021-01-31 MED ORDER — POLYVINYL ALCOHOL 1.4 % OP SOLN
2.0000 [drp] | Freq: Every day | OPHTHALMIC | Status: DC
  Administered 2021-02-01 – 2021-02-02 (×2): 2 [drp] via OPHTHALMIC
  Filled 2021-01-31: qty 15

## 2021-01-31 MED ORDER — LEVOTHYROXINE SODIUM 88 MCG PO TABS: 44.0000 ug | ORAL_TABLET | ORAL | Status: DC

## 2021-01-31 MED ORDER — ACETAMINOPHEN 650 MG RE SUPP: 650.0000 mg | Freq: Four times a day (QID) | RECTAL | Status: DC | PRN

## 2021-01-31 MED ORDER — PANTOPRAZOLE SODIUM 40 MG IV SOLR
40.0000 mg | Freq: Two times a day (BID) | INTRAVENOUS | Status: DC
  Administered 2021-02-01: 40 mg via INTRAVENOUS
  Filled 2021-01-31: qty 40

## 2021-01-31 MED ORDER — ONDANSETRON HCL 4 MG PO TABS: 4.0000 mg | ORAL_TABLET | Freq: Four times a day (QID) | ORAL | Status: DC | PRN

## 2021-01-31 MED ORDER — PANTOPRAZOLE SODIUM 40 MG IV SOLR
40.0000 mg | Freq: Once | INTRAVENOUS | Status: AC
  Administered 2021-01-31: 40 mg via INTRAVENOUS
  Filled 2021-01-31: qty 40

## 2021-01-31 NOTE — ED Provider Notes (Signed)
[facility] EMERGENCY DEPARTMENT Provider Note   CSN: [id number] Arrival date & time: 01/31/21  1640     History No chief complaint on file.   Beth Santiago is a 87 y.o. female with a PMHx of Atrial flutter, GERD, brought into the ED by EMS from [facility] complaining of coffee-ground emesis x2 days. Patient had hemoglobin drawn on 01/30/2021 and was at 9.  Hemoglobin today found to be at 7.9 at facility.  Per the nurse at her facility, her recent episode of emesis was last night and coffee ground appearing.  Patient's nurse reports that the patient has been lethargic and pale.  Patient's nurse reports that patient has been complaining of stomach discomfort and nausea.  Per the nurse at her facility, patient had her Xarelto stopped today and to be stopped for 3 days.  No NSAID use prior to today.  No diarrhea or blood in stool.    The history is provided by the patient and the nursing home. No language interpreter was used.      Past Medical History:  Diagnosis Date   Atrial flutter ([place])    a. Noted during pacemaker implantation 2011, spontaneously terminated.   Biatrial enlargement    severe   CAD (coronary artery disease)    a. Nonobstructive by cath 2005 - 40-50% mid LAD. b. Nonischemic nuc 05/2011.   Carotid artery disease ([place])    a. 1-50% bilateral (upper end) - March 2011.   CHF (congestive heart failure) ([place])    Complete heart block ([place])    a. s/p PPM 2005. b. Gen change to Medtronic by Dr Rayann Heman  9/11 for premature ERI 11/2009.   DJD (degenerative joint disease)    GERD (gastroesophageal reflux disease)    Hemoptysis 05/2016   Hyperlipidemia    Hypertension    Hypothyroid    Permanent atrial fibrillation ([place]) 05/2011   Severe tricuspid regurgitation     Patient Active Problem List   Diagnosis Date Noted   Upper GI bleeding 01/31/2021   Acute blood loss anemia 01/31/2021   Enterococcal bacteremia 06/30/2017   Chronic  diastolic CHF (congestive heart failure) ([place]) 05/25/2017   Elevated troponin 06/24/2016   Lactic acidosis 06/24/2016   Hypokalemia 06/24/2016   Hyponatremia 06/24/2016   Elevated lactic acid level    Other specified hypothyroidism    Acute on chronic diastolic congestive heart failure ([place]) 03/11/2016   Snoring 03/11/2016   Dizziness 03/26/2014   Fatigue 12/09/2012   Precordial chest pain 12/09/2012   Dyspnea 06/11/2011   Atrial fibrillation ([place]) 06/11/2011   HLD (hyperlipidemia) 12/11/2009   HYPERTENSION, BENIGN 12/11/2009   CAD, NATIVE VESSEL 12/11/2009   AV BLOCK, COMPLETE 12/11/2009   PACEMAKER-Medtronic 12/11/2009    Past Surgical History:  Procedure Laterality Date   ABDOMINAL SURGERY     twisted bowel   CARDIOVERSION  06/11/2011   Procedure: CARDIOVERSION;  Surgeon: Jolaine Artist, MD;  Location: [facility] ENDOSCOPY;  Service: Cardiovascular;  Laterality: N/A;   CARDIOVERSION N/A 01/04/2013   Procedure: CARDIOVERSION;  Surgeon: Dorothy Spark, MD;  Location: [facility] ENDOSCOPY;  Service: Cardiovascular;  Laterality: N/A;   [place]  2005, 12/11/09   implanted by Dr Verlon Setting, generator change 9/11 by Greggory Brandy for premature ERI   PPM GENERATOR CHANGEOUT N/A 01/08/2021   Procedure: PPM GENERATOR CHANGEOUT;  Surgeon: Thompson Grayer, MD;  Location: [facility] CV LAB;  Service: Cardiovascular;  Laterality:  N/A;   TEE WITHOUT CARDIOVERSION  06/11/2011   Procedure: TRANSESOPHAGEAL ECHOCARDIOGRAM (TEE);  Surgeon: Jolaine Artist, MD;  Location: [facility] ENDOSCOPY;  Service: Cardiovascular;  Laterality: N/A;   TEE WITHOUT CARDIOVERSION N/A 05/27/2017   Procedure: TRANSESOPHAGEAL ECHOCARDIOGRAM (TEE);  Surgeon: Jerline Pain, MD;  Location: [facility] ENDOSCOPY;  Service: Cardiovascular;  Laterality: N/A;   TONSILLECTOMY       OB History   No obstetric history on file.     Family History  Problem Relation Age of Onset   Cancer Mother    Diabetes  Mother    Hypertension Mother    Heart disease Father    Cancer Brother    Heart disease Brother     Social History   Tobacco Use   Smoking status: Never   Smokeless tobacco: Never  Vaping Use   Vaping Use: Never used  Substance Use Topics   Alcohol use: Yes    Comment: 05/2016    OCCASIONAL    Drug use: No    Home Medications Prior to Admission medications   Medication Sig Start Date End Date Taking? Authorizing Provider  acetaminophen (TYLENOL) 500 MG tablet Take 500 mg by mouth in the morning, at noon, and at bedtime. (0900, 1300 & 2100)   Yes [provider]  BYSTOLIC 5 MG tablet TAKE 1/2 TABLET BY MOUTH DAILY Patient taking differently: Take 2.5 mg by mouth daily. 04/20/19  Yes Baldwin Jamaica, PA-C  carboxymethylcellulose (ARTIFICIAL TEARS) 1 % ophthalmic solution Place 2 drops into both eyes in the morning and at bedtime. (0900 & 2100)   Yes [provider]  Cholecalciferol (VITAMIN D-3) 125 MCG (5000 UT) TABS Take 5,000 Units by mouth daily.   Yes [provider]  ferrous sulfate 325 (65 FE) MG tablet Take 325 mg by mouth daily.   Yes [provider]  furosemide (LASIX) 40 MG tablet Take 20 mg by mouth daily.   Yes [provider]  levothyroxine (SYNTHROID, LEVOTHROID) 88 MCG tablet Take 44-88 mcg by mouth See admin instructions. 44 mcg by mouth on Tuesdays at 0630, and 88 mcg by mouth all other days of the week at 0630 03/13/14  Yes [provider]  Multiple Vitamin (MULTIVITAMIN WITH MINERALS) TABS tablet Take 1 tablet by mouth daily.   Yes [provider]  pantoprazole (PROTONIX) 40 MG tablet Take 40 mg by mouth 2 (two) times daily.   Yes [provider]  potassium chloride SA (KLOR-CON) 20 MEQ tablet TAKE 1 TABLET BY MOUTH  DAILY AND TAKE 1 EXTRA  TABLET WHEN YOU TAKE EXTRA  LASIX Patient taking differently: Take 20 mEq by mouth daily at 6 (six) AM. 03/06/19  Yes Bensimhon, Shaune Pascal, MD  Rivaroxaban  (XARELTO) 15 MG TABS tablet Take 1 tablet (15 mg total) by mouth daily with supper. 01/10/21  Yes Allred, Jeneen Rinks, MD  sodium chloride 0.9 % infusion Inject 60 mLs into the vein 2 (two) times daily as needed (hydration). 41ml/hour 01/29/21  Yes [provider]  spironolactone (ALDACTONE) 25 MG tablet Take 12.5 mg by mouth daily.   Yes [provider]  sucralfate (CARAFATE) 1 GM/10ML suspension Take 1 g by mouth 4 (four) times daily -  with meals and at bedtime. For 2 weeks 01/30/21 02/12/21 Yes [provider]  timolol (TIMOPTIC) 0.5 % ophthalmic solution Place 1 drop into both eyes daily. 01/03/21  Yes [provider]    Allergies    Patient has no known allergies.  Review of Systems   Review of Systems  Respiratory:  Negative for shortness of breath.   Cardiovascular:  Negative for chest pain.  Gastrointestinal:  Positive for abdominal pain, nausea and vomiting. Negative for blood in stool, constipation and diarrhea.  Skin:  Negative for rash.  Neurological:  Positive for light-headedness. Negative for dizziness, weakness and numbness.  All other systems reviewed and are negative.  Physical Exam Updated Vital Signs BP (!) 115/59   Pulse 64   Temp 97.7 F (36.5 C)   Resp 20   Ht 5\' 6"  (1.676 m)   Wt 70 kg   SpO2 100%   BMI 24.91 kg/m   Physical Exam Vitals and nursing note reviewed. Exam conducted with a chaperone present.  Constitutional:      General: She is not in acute distress.    Appearance: She is not diaphoretic.  HENT:     Head: Normocephalic and atraumatic.     Mouth/Throat:     Pharynx: No oropharyngeal exudate.  Eyes:     General: No scleral icterus.    Conjunctiva/sclera: Conjunctivae normal.  Cardiovascular:     Rate and Rhythm: Normal rate and regular rhythm.     Pulses: Normal pulses.          Radial pulses are 2+ on the right side and 2+ on the left side.       Femoral pulses are 2+ on the right side and 2+ on the left  side.    Heart sounds: Normal heart sounds.  Pulmonary:     Effort: Pulmonary effort is normal. No respiratory distress.     Breath sounds: Normal breath sounds. No wheezing.  Abdominal:     General: Bowel sounds are normal.     Palpations: Abdomen is soft. There is no mass.     Tenderness: There is no abdominal tenderness. There is no guarding or rebound.  Genitourinary:    Rectum: Guaiac result positive. External hemorrhoid present. No tenderness.     Comments: Chaperone present for exam.  External hemorrhoid noted.  No gross blood noted.  Musculoskeletal:        General: Normal range of motion.     Cervical back: Normal range of motion and neck supple.  Skin:    General: Skin is warm and dry.  Neurological:     Mental Status: She is alert.  Psychiatric:        Behavior: Behavior normal.    ED Results / Procedures / Treatments   Labs (all labs ordered are listed, but only abnormal results are displayed) Labs Reviewed  CBC WITH DIFFERENTIAL/PLATELET - Abnormal; Notable for the following components:      Result Value   WBC 11.9 (*)    RBC 3.01 (*)    Hemoglobin 8.5 (*)    HCT 27.4 (*)    RDW 17.8 (*)    Neutro Abs 9.6 (*)    All other components within normal limits  PROTIME-INR - Abnormal; Notable for the following components:   Prothrombin Time 17.9 (*)    INR 1.5 (*)    All other components within normal limits  APTT - Abnormal; Notable for the following components:   aPTT 37 (*)    All other components within normal limits  COMPREHENSIVE METABOLIC PANEL - Abnormal; Notable for the following components:   Chloride 113 (*)    CO2 21 (*)    Glucose, Bld 116 (*)    Total Protein 5.6 (*)    Albumin  2.9 (*)    All other components within normal limits  POC OCCULT BLOOD, ED - Abnormal; Notable for the following components:   Fecal Occult Bld POSITIVE (*)    All other components within normal limits  I-STAT CHEM 8, ED - Abnormal; Notable for the following components:    Glucose, Bld 110 (*)    TCO2 20 (*)    Hemoglobin 9.5 (*)    HCT 28.0 (*)    All other components within normal limits  RESP PANEL BY RT-PCR (FLU A&B, COVID) ARPGX2  BASIC METABOLIC PANEL  HEMOGLOBIN  HEMOGLOBIN  HEMATOCRIT  HEMATOCRIT  TYPE AND SCREEN    EKG EKG Interpretation  Date/Time:  Friday January 31 2021 18:30:35 EST Ventricular Rate:  60 PR Interval:  140 QRS Duration: 160 QT Interval:  502 QTC Calculation: 502 R Axis:   -87 Text Interpretation: paced ventricular rhythm Nonspecific IVCD with LAD Left ventricular hypertrophy Probable inferior infarct, acute Anterolateral infarct, old Confirmed by Noemi Chapel [phone]) on 01/31/2021 7:19:41 PM  Radiology No results found.  Procedures Procedures   Medications Ordered in ED Medications  polyvinyl alcohol (LIQUIFILM TEARS) 1.4 % ophthalmic solution 2 drop (has no administration in time range)  timolol (TIMOPTIC) 0.5 % ophthalmic solution 1 drop (has no administration in time range)  sodium chloride flush (NS) 0.9 % injection 3 mL (3 mLs Intravenous Given 01/31/21 2219)  acetaminophen (TYLENOL) tablet 650 mg (has no administration in time range)    Or  acetaminophen (TYLENOL) suppository 650 mg (has no administration in time range)  ondansetron (ZOFRAN) tablet 4 mg (has no administration in time range)    Or  ondansetron (ZOFRAN) injection 4 mg (has no administration in time range)  pantoprazole (PROTONIX) injection 40 mg (has no administration in time range)  levothyroxine (SYNTHROID) tablet 88 mcg (has no administration in time range)    And  levothyroxine (SYNTHROID) tablet 44 mcg (has no administration in time range)  pantoprazole (PROTONIX) injection 40 mg (40 mg Intravenous Given 01/31/21 1831)    ED Course  I have reviewed the triage vital signs and the nursing notes.  Pertinent labs & imaging results that were available during my care of the patient were reviewed by me and considered in my medical  decision making (see chart for details).  Clinical Course as of 01/31/21 2324  Fri Jan 31, 2021  1747 Spoke with RN at [facility].  Patient has had coffee-ground emesis x2 days with hemoglobin declining.  Hemoglobin was initially 9 on 01/30/2021 and found to be at 7.9 today.  Nurse denies patient having blood in stool or diarrhea.  Patient was given all but 200 mL of 2 L of normal saline prior to arrival. [SB]  1803 Dr. Theda Sers from [facility] called regarding patient's status.  Updated Dr. Theda Sers on treatment plan thus far.  [SB]  2021 Attending consulted GI, GI recommended protonix and NPO at midnight.  [SB]  2118 Attending consulted Hospitalist, Hospitalist recommend admission.  [SB]    Clinical Course User Index [SB] Primo Innis A, PA-C    MDM Rules/Calculators/A&P                          Patient presents to the ED from facility via EMS with reported coffee-ground emesis x2 days.  Patient also with abdominal soreness and nausea.  Per facility patient with declining hemoglobin levels since yesterday, 01/30/21.  Hemoglobin yesterday at facility noted to be  9.  Hemoglobin today of facility at 7.9.  Xarelto discontinued today by facility. No obvious source of bleeding.  Patient given 2 L of normal saline at facility. Patient slightly hypotensive, oxygen saturation at 99%.  Patient has not had endoscopy before in the past. Differential diagnosis includes peptic ulcer disease, esophageal ulcer, gastric etiology, or varices. EKG without acute ST/T changes.   Labs notable for hemoglobin decreased at 8.5.  Hemoccult positive in the ED. Patient NPO. Consult with GI who recommended Protonix and NPO at midnight.   Patient presentation suspicious for upper GI bleed.  Consult with hospitalist for admission to hospital.  Hospitalist recommends admission for further evaluation.   Final Clinical Impression(s) / ED Diagnoses Final diagnoses:  Upper GI bleeding    Rx / DC Orders ED  Discharge Orders     None        Bernerd Terhune A, PA-C 01/31/21 2325    Noemi Chapel, MD 02/01/21 1455

## 2021-01-31 NOTE — ED Provider Notes (Signed)
This patient is a 87 year old female presenting with upper gastrointestinal bleeding, she has been anticoagulated on Xarelto for quite some time but has had a recent drop in her hemoglobin and overnight dropped another gram with some coffee-ground emesis witnessed by staff.  Here the patient had a slight bit of hypotension but responded well to IV fluids, currently doing much better, lab work is reassuring but does show a drop in hemoglobin, discussed with Dr. Leone Payor who will see the patient in the morning from a GI standpoint and agreeable to fluids, Protonix and admission to hospitalist.  Discussed with Dr. Antionette Char who will admit.  Medical screening examination/treatment/procedure(s) were conducted as a shared visit with non-physician practitioner(s) and myself.  I personally evaluated the patient during the encounter.  Clinical Impression:   Final diagnoses:  Upper GI bleeding         Eber Hong, MD 02/01/21 1455

## 2021-01-31 NOTE — ED Triage Notes (Signed)
Pt bib [facility] from [facility] where staff reports a hemoglobin of 7.6 today. Pt arrives with no complaints and denies pain at this time. VSS. AOx4.

## 2021-01-31 NOTE — H&P (Addendum)
History and Physical    [name] RCB:[id number] DOB: March 12, 1925 DOA: 01/31/2021  PCP: Jarome Matin, MD   Patient coming from: SNF   Chief Complaint: Coffee ground emesis, drop in Hgb   HPI: Beth Santiago is a pleasant 87 y.o. female with medical history significant for heart block with pacer, coronary artery disease, chronic diastolic CHF, hypothyroidism, and atrial fibrillation on Xarelto, now presenting from her SNF for evaluation of coffee-ground emesis and drop in hemoglobin.  Patient was noted to have coffee-ground emesis at her nursing facility, Xarelto was placed on hold, and she had blood work performed with hemoglobin 7.5, lower than priors.  Patient was sent to the emergency department where she has no complaints, remembers vomiting earlier, denies abdominal pain, and was not sure why she was here.  ED Course: Upon arrival to the ED, patient is found to be afebrile, saturating well, and with systolic blood pressure 104.  EKG features a paced rhythm.  Hemoglobin is 8.5, down from 11 last month, and fecal occult blood testing is positive.  Patient was given 40 mg IV Protonix in the ED and gastroenterology was consulted by the ED physician.  Review of Systems:  Unable to complete ROS secondary the patient's clinical condition.  Past Medical History:  Diagnosis Date   Atrial flutter (HCC)    a. Noted during pacemaker implantation 2011, spontaneously terminated.   Biatrial enlargement    severe   CAD (coronary artery disease)    a. Nonobstructive by cath 2005 - 40-50% mid LAD. b. Nonischemic nuc 05/2011.   Carotid artery disease (HCC)    a. 1-50% bilateral (upper end) - March 2011.   CHF (congestive heart failure) (HCC)    Complete heart block (HCC)    a. s/p PPM 2005. b. Gen change to [employer] by Dr Johney Frame  9/11 for premature ERI 11/2009.   DJD (degenerative joint disease)    GERD (gastroesophageal reflux disease)    Hemoptysis 05/2016   Hyperlipidemia     Hypertension    Hypothyroid    Permanent atrial fibrillation (HCC) 05/2011   Severe tricuspid regurgitation     Past Surgical History:  Procedure Laterality Date   ABDOMINAL SURGERY     twisted bowel   CARDIOVERSION  06/11/2011   Procedure: CARDIOVERSION;  Surgeon: Dolores Patty, MD;  Location: [facility] ENDOSCOPY;  Service: Cardiovascular;  Laterality: N/A;   CARDIOVERSION N/A 01/04/2013   Procedure: CARDIOVERSION;  Surgeon: Lars Masson, MD;  Location: [facility] ENDOSCOPY;  Service: Cardiovascular;  Laterality: N/A;   COLON SURGERY     HEMORROIDECTOMY     PACEMAKER INSERTION  2005, 12/11/09   implanted by Dr Reyes Ivan, generator change 9/11 by Fawn Kirk for premature ERI   PPM GENERATOR CHANGEOUT N/A 01/08/2021   Procedure: PPM GENERATOR CHANGEOUT;  Surgeon: [name], MD;  Location: [facility] INVASIVE CV LAB;  Service: Cardiovascular;  Laterality: N/A;   TEE WITHOUT CARDIOVERSION  06/11/2011   Procedure: TRANSESOPHAGEAL ECHOCARDIOGRAM (TEE);  Surgeon: Dolores Patty, MD;  Location: [facility] ENDOSCOPY;  Service: Cardiovascular;  Laterality: N/A;   TEE WITHOUT CARDIOVERSION N/A 05/27/2017   Procedure: TRANSESOPHAGEAL ECHOCARDIOGRAM (TEE);  Surgeon: Jake Bathe, MD;  Location: [facility] ENDOSCOPY;  Service: Cardiovascular;  Laterality: N/A;   TONSILLECTOMY      Social History:   reports that she has never smoked. She has never used smokeless tobacco. She reports current alcohol use. She reports that she does not use drugs.  No Known Allergies  Family History  Problem  Relation Age of Onset   Cancer Mother    Diabetes Mother    Hypertension Mother    Heart disease Father    Cancer Brother    Heart disease Brother      Prior to Admission medications   Medication Sig Start Date End Date Taking? Authorizing Provider  acetaminophen (TYLENOL) 500 MG tablet Take 500 mg by mouth in the morning, at noon, and at bedtime. (0900, 1300 & 2100)   Yes [provider]  BYSTOLIC 5 MG tablet TAKE 1/2  TABLET BY MOUTH DAILY Patient taking differently: Take 2.5 mg by mouth daily. 04/20/19  Yes Sheilah Pigeon, PA-C  carboxymethylcellulose (ARTIFICIAL TEARS) 1 % ophthalmic solution Place 2 drops into both eyes in the morning and at bedtime. (0900 & 2100)   Yes [provider]  Cholecalciferol (VITAMIN D-3) 125 MCG (5000 UT) TABS Take 5,000 Units by mouth daily.   Yes [provider]  ferrous sulfate 325 (65 FE) MG tablet Take 325 mg by mouth daily.   Yes [provider]  furosemide (LASIX) 40 MG tablet Take 20 mg by mouth daily.   Yes [provider]  levothyroxine (SYNTHROID, LEVOTHROID) 88 MCG tablet Take 44-88 mcg by mouth See admin instructions. 44 mcg by mouth on Tuesdays at 0630, and 88 mcg by mouth all other days of the week at 0630 03/13/14  Yes [provider]  Multiple Vitamin (MULTIVITAMIN WITH MINERALS) TABS tablet Take 1 tablet by mouth daily.   Yes [provider]  pantoprazole (PROTONIX) 40 MG tablet Take 40 mg by mouth 2 (two) times daily.   Yes [provider]  potassium chloride SA (KLOR-CON) 20 MEQ tablet TAKE 1 TABLET BY MOUTH  DAILY AND TAKE 1 EXTRA  TABLET WHEN YOU TAKE EXTRA  LASIX Patient taking differently: Take 20 mEq by mouth daily at 6 (six) AM. 03/06/19  Yes Bensimhon, Bevelyn Buckles, MD  Rivaroxaban (XARELTO) 15 MG TABS tablet Take 1 tablet (15 mg total) by mouth daily with supper. 01/10/21  Yes Allred, Fayrene Fearing, MD  sodium chloride 0.9 % infusion Inject 60 mLs into the vein 2 (two) times daily as needed (hydration). 52ml/hour 01/29/21  Yes [provider]  spironolactone (ALDACTONE) 25 MG tablet Take 12.5 mg by mouth daily.   Yes [provider]  sucralfate (CARAFATE) 1 GM/10ML suspension Take 1 g by mouth 4 (four) times daily -  with meals and at bedtime. For 2 weeks 01/30/21 02/12/21 Yes [provider]  timolol (TIMOPTIC) 0.5 % ophthalmic solution Place 1 drop into both eyes daily.  01/03/21  Yes [provider]    Physical Exam: Vitals:   01/31/21 1700 01/31/21 1815 01/31/21 1830 01/31/21 1845  BP:  (!) 106/54 (!) 104/48 132/85  Pulse:  (!) 59 (!) 59 (!) 59  Resp:  (!) 22 17 (!) 25  Temp:      SpO2:  99% 100% 99%  Weight: 70 kg     Height: 5\' 6"  (1.676 m)       Constitutional: NAD, calm  Eyes: PERTLA, lids and conjunctivae normal ENMT: Mucous membranes are moist. Posterior pharynx clear of any exudate or lesions.   Neck: supple, no masses  Respiratory:  no wheezing, no crackles. No accessory muscle use.  Cardiovascular: S1 & S2 heard, regular rate and rhythm. No extremity edema.   Abdomen: No distension, no tenderness, soft. Bowel sounds active.  Musculoskeletal: no clubbing / cyanosis. No joint deformity upper and lower extremities.  Skin: no significant rashes, lesions, ulcers. Warm, dry, well-perfused. Neurologic: CN 2-12 grossly intact. Moving all extremities. Alert and oriented to person only.  Psychiatric: Calm. Cooperative.    Labs and Imaging on Admission: I have personally reviewed following labs and imaging studies  CBC: Recent Labs  Lab 01/31/21 2035 01/31/21 2051  WBC 11.9*  --   NEUTROABS 9.6*  --   HGB 8.5* 9.5*  HCT 27.4* 28.0*  MCV 91.0  --   PLT 199  --    Basic Metabolic Panel: Recent Labs  Lab 01/31/21 2035 01/31/21 2051  NA 140 142  K 4.0 3.7  CL 113* 110  CO2 21*  --   GLUCOSE 116* 110*  BUN 18 18  CREATININE 0.61 0.60  CALCIUM 8.9  --    GFR: Estimated Creatinine Clearance: 40.3 mL/min (by C-G formula based on SCr of 0.6 mg/dL). Liver Function Tests: Recent Labs  Lab 01/31/21 2035  AST 20  ALT 8  ALKPHOS 55  BILITOT 0.6  PROT 5.6*  ALBUMIN 2.9*   No results for input(s): LIPASE, AMYLASE in the last 168 hours. No results for input(s): AMMONIA in the last 168 hours. Coagulation Profile: Recent Labs  Lab 01/31/21 2035  INR 1.5*   Cardiac Enzymes: No results for input(s): CKTOTAL, CKMB,  CKMBINDEX, TROPONINI in the last 168 hours. BNP (last 3 results) No results for input(s): PROBNP in the last 8760 hours. HbA1C: No results for input(s): HGBA1C in the last 72 hours. CBG: No results for input(s): GLUCAP in the last 168 hours. Lipid Profile: No results for input(s): CHOL, HDL, LDLCALC, TRIG, CHOLHDL, LDLDIRECT in the last 72 hours. Thyroid Function Tests: No results for input(s): TSH, T4TOTAL, FREET4, T3FREE, THYROIDAB in the last 72 hours. Anemia Panel: No results for input(s): VITAMINB12, FOLATE, FERRITIN, TIBC, IRON, RETICCTPCT in the last 72 hours. Urine analysis:    Component Value Date/Time   COLORURINE AMBER (A) 05/24/2017 2157   APPEARANCEUR CLEAR 05/24/2017 2157   LABSPEC 1.020 05/24/2017 2157   PHURINE 5.0 05/24/2017 2157   GLUCOSEU NEGATIVE 05/24/2017 2157   HGBUR SMALL (A) 05/24/2017 2157   BILIRUBINUR NEGATIVE 05/24/2017 2157   KETONESUR NEGATIVE 05/24/2017 2157   PROTEINUR NEGATIVE 05/24/2017 2157   UROBILINOGEN 0.2 12/09/2012 1401   NITRITE NEGATIVE 05/24/2017 2157   LEUKOCYTESUR TRACE (A) 05/24/2017 2157   Sepsis Labs: @LABRCNTIP (procalcitonin:4,lacticidven:4) )No results found for this or any previous visit (from the past 240 hour(s)).   Radiological Exams on Admission: No results found.  EKG: Independently reviewed. Paced rhythm.    Assessment/Plan   1. Acute upper GI bleeding; anemia  - Presents from SNF with coffee ground emesis and acute anemia; Xarelto was being held at [facility], unclear when last dose was  - FOBT positive in ED and Hgb 8.5, down from 11.0 a month earlier  - She was given IV PPI in ED and GI was consulted by ED physician  - Continue to hold Xarelto, continue IV PPI, trend H&H, transfuse as needed     2. Atrial fibrillation  - Continue to hold Xarelto   3. Chronic diastolic CHF  - Appears compensated, EF was 55-60% on TTE in 2019   - Hold diuretics initially in light of bleeding and NPO status, monitor volume status     4. Hypertension  - Hold antihypertensives initially in light of GIB    DVT prophylaxis: SCDs  Code Status: DNR, confirmed on admission  Level of Care: Level of care: Telemetry Medical Family Communication:  Son, Alana Dayton, updated by phone  Disposition Plan:  Patient is from: SNF  Anticipated d/c is to: SNF  Anticipated d/c date is: 02/02/21 Patient currently: Pending GI consultation, monitoring of H&H  Consults called: GI consulted by ED physician  Admission status: Inpatient     Briscoe Deutscher, MD [facility]  01/31/2021, 10:03 PM

## 2021-02-01 ENCOUNTER — Inpatient Hospital Stay (HOSPITAL_COMMUNITY): Payer: Medicare Other | Admitting: Anesthesiology

## 2021-02-01 ENCOUNTER — Encounter (HOSPITAL_COMMUNITY): Payer: Self-pay | Admitting: Family Medicine

## 2021-02-01 ENCOUNTER — Encounter (HOSPITAL_COMMUNITY)
Admission: EM | Disposition: A | Discharge status: Skilled Nursing Facility | Payer: Self-pay | Source: Home / Self Care | Attending: Family Medicine

## 2021-02-01 DIAGNOSIS — K449 Diaphragmatic hernia without obstruction or gangrene: Secondary | ICD-10-CM

## 2021-02-01 DIAGNOSIS — I5032 Chronic diastolic (congestive) heart failure: Secondary | ICD-10-CM

## 2021-02-01 DIAGNOSIS — K920 Hematemesis: Secondary | ICD-10-CM

## 2021-02-01 DIAGNOSIS — K922 Gastrointestinal hemorrhage, unspecified: Secondary | ICD-10-CM | POA: Diagnosis not present

## 2021-02-01 HISTORY — PX: ESOPHAGOGASTRODUODENOSCOPY (EGD) WITH PROPOFOL: SHX5813

## 2021-02-01 HISTORY — PX: BIOPSY: SHX5522

## 2021-02-01 LAB — BASIC METABOLIC PANEL
Anion gap: 6 (ref 5–15)
BUN: 17 mg/dL (ref 8–23)
CO2: 20 mmol/L — ABNORMAL LOW (ref 22–32)
Calcium: 8.6 mg/dL — ABNORMAL LOW (ref 8.9–10.3)
Chloride: 112 mmol/L — ABNORMAL HIGH (ref 98–111)
Creatinine, Ser: 0.67 mg/dL (ref 0.44–1.00)
GFR, Estimated: >60 mL/min (ref >60)
Glucose, Bld: 130 mg/dL — ABNORMAL HIGH (ref 70–99)
Potassium: 4.2 mmol/L (ref 3.5–5.1)
Sodium: 138 mmol/L (ref 135–145)

## 2021-02-01 LAB — HEMATOCRIT
HCT: 26.4 % — ABNORMAL LOW (ref 36.0–46.0)
HCT: 25.2 % — ABNORMAL LOW (ref 36.0–46.0)
HCT: 26.2 % — ABNORMAL LOW (ref 36.0–46.0)

## 2021-02-01 LAB — HEMOGLOBIN
Hemoglobin: 8.2 g/dL — ABNORMAL LOW (ref 12.0–15.0)
Hemoglobin: 7.6 g/dL — ABNORMAL LOW (ref 12.0–15.0)
Hemoglobin: 8.1 g/dL — ABNORMAL LOW (ref 12.0–15.0)

## 2021-02-01 SURGERY — ESOPHAGOGASTRODUODENOSCOPY (EGD) WITH PROPOFOL
Anesthesia: Monitor Anesthesia Care

## 2021-02-01 MED ORDER — ALBUTEROL SULFATE (2.5 MG/3ML) 0.083% IN NEBU
INHALATION_SOLUTION | RESPIRATORY_TRACT | Status: AC
  Filled 2021-02-01: qty 3

## 2021-02-01 MED ORDER — PROPOFOL 500 MG/50ML IV EMUL
INTRAVENOUS | Status: DC | PRN
  Administered 2021-02-01: 50 ug/kg/min via INTRAVENOUS

## 2021-02-01 MED ORDER — SODIUM CHLORIDE 0.9 % IV SOLN: INTRAVENOUS | Status: DC | PRN

## 2021-02-01 MED ORDER — PHENYLEPHRINE HCL-NACL 20-0.9 MG/250ML-% IV SOLN
INTRAVENOUS | Status: DC | PRN
  Administered 2021-02-01: 50 ug/min via INTRAVENOUS

## 2021-02-01 MED ORDER — ALBUTEROL SULFATE (2.5 MG/3ML) 0.083% IN NEBU
2.5000 mg | INHALATION_SOLUTION | Freq: Once | RESPIRATORY_TRACT | Status: AC
  Administered 2021-02-01: 2.5 mg via RESPIRATORY_TRACT

## 2021-02-01 MED ORDER — LIDOCAINE 2% (20 MG/ML) 5 ML SYRINGE
INTRAMUSCULAR | Status: DC | PRN
  Administered 2021-02-01: 60 mg via INTRAVENOUS

## 2021-02-01 MED ORDER — SUCRALFATE 1 GM/10ML PO SUSP
1.0000 g | Freq: Three times a day (TID) | ORAL | Status: DC
  Administered 2021-02-01 – 2021-02-02 (×4): 1 g via ORAL
  Filled 2021-02-01 (×4): qty 10

## 2021-02-01 MED ORDER — PROPOFOL 10 MG/ML IV BOLUS
INTRAVENOUS | Status: DC | PRN
  Administered 2021-02-01: 20 mg via INTRAVENOUS

## 2021-02-01 MED ORDER — PANTOPRAZOLE SODIUM 40 MG PO TBEC
40.0000 mg | DELAYED_RELEASE_TABLET | Freq: Two times a day (BID) | ORAL | Status: DC
  Administered 2021-02-01 – 2021-02-02 (×2): 40 mg via ORAL
  Filled 2021-02-01 (×2): qty 1

## 2021-02-01 SURGICAL SUPPLY — 15 items

## 2021-02-01 NOTE — Anesthesia Preprocedure Evaluation (Signed)
Anesthesia Evaluation  Patient identified by MRN, date of birth, ID band Patient awake    Reviewed: Allergy & Precautions, NPO status , Patient's Chart, lab work & pertinent test results, reviewed documented beta blocker date and time   History of Anesthesia Complications Negative for: history of anesthetic complications  Airway Mallampati: III  TM Distance: >3 FB Neck ROM: Full    Dental  (+) Dental Advisory Given   Pulmonary shortness of breath, neg sleep apnea, neg COPD, neg recent URI,     + wheezing      Cardiovascular hypertension, Pt. on home beta blockers and Pt. on medications + CAD and +CHF  + dysrhythmias Atrial Fibrillation  Rhythm:Regular  Left ventricle: Systolic function was normal. The estimated  ejection fraction was in the range of 55% to 60%. Wall motion was  normal; there were no regional wall motion abnormalities.  - Aortic valve: No evidence of vegetation. There was mild  regurgitation.  - Mitral valve: Mildly calcified annulus. Mildly thickened leaflets  . No evidence of vegetation. There was mild regurgitation.  - Left atrium: No evidence of thrombus in the atrial cavity or  appendage. No evidence of thrombus in the appendage.  - Right ventricle: The cavity size was mildly dilated. Wall  thickness was normal.  - Right atrium: The atrium was dilated.  - Atrial septum: No defect or patent foramen ovale was identified.  Echo contrast study showed no right-to-left atrial level shunt,  following an increase in RA pressure induced by provocative  maneuvers.  - Tricuspid valve: No evidence of vegetation. There was severe  regurgitation.  - Pulmonic valve: No evidence of vegetation.  - Line: A venous pacing wire was visualized in the right atrial  cavity and right ventricular cavity, with its tip at the RV apex.    Neuro/Psych negative neurological ROS     GI/Hepatic Neg liver ROS,  GERD  ,? GI bleed   Endo/Other  Hypothyroidism   Renal/GU Renal diseaseLab Results      Component                Value               Date                      CREATININE               0.67                02/01/2021                Musculoskeletal   Abdominal   Peds  Hematology  (+) Blood dyscrasia, anemia , Lab Results      Component                Value               Date                      WBC                      11.9 (H)            01/31/2021                HGB                      7.6 (L)  02/01/2021                HCT                      25.2 (L)            02/01/2021                MCV                      91.0                01/31/2021                PLT                      199                 01/31/2021              Anesthesia Other Findings   Reproductive/Obstetrics                             Anesthesia Physical Anesthesia Plan  ASA: 3  Anesthesia Plan: MAC   Post-op Pain Management:    Induction: Intravenous  PONV Risk Score and Plan: 2 and Propofol infusion and Treatment may vary due to age or medical condition  Airway Management Planned: Nasal Cannula  Additional Equipment: None  Intra-op Plan:   Post-operative Plan:   Informed Consent: I have reviewed the patients History and Physical, chart, labs and discussed the procedure including the risks, benefits and alternatives for the proposed anesthesia with the patient or authorized representative who has indicated his/her understanding and acceptance.     Dental advisory given  Plan Discussed with: CRNA and Anesthesiologist  Anesthesia Plan Comments:         Anesthesia Quick Evaluation

## 2021-02-01 NOTE — Transfer of Care (Signed)
Immediate Anesthesia Transfer of Care Note  Patient: Beth Santiago  Procedure(s) Performed: ESOPHAGOGASTRODUODENOSCOPY (EGD) WITH PROPOFOL BIOPSY  Patient Location: PACU  Anesthesia Type:MAC  Level of Consciousness: drowsy  Airway & Oxygen Therapy: Patient Spontanous Breathing and Patient connected to nasal cannula oxygen  Post-op Assessment: Report given to RN and Post -op Vital signs reviewed and stable  Post vital signs: Reviewed and stable  Last Vitals:  Vitals Value Taken Time  BP 94/46 02/01/21 1507  Temp    Pulse 62 02/01/21 1508  Resp 23 02/01/21 1508  SpO2 98 % 02/01/21 1508  Vitals shown include unvalidated device data.  Last Pain:  Vitals:   02/01/21 1435  TempSrc: Tympanic  PainSc: 0-No pain         Complications: No notable events documented.

## 2021-02-01 NOTE — Op Note (Signed)
[facility] Patient Name: Beth Santiago Procedure Date : 02/01/2021 MRN: [id number] Attending MD: Doristine Locks , MD Date of Birth: Dec 22, 1924 CSN: [id number] Age: 87 Admit Type: Inpatient Procedure:                Upper GI endoscopy Indications:              Acute post hemorrhagic anemia, Coffee-ground                            emesis, Nausea with vomiting Providers:                Doristine Locks, MD, Janae Sauce. Steele Berg, RN, Kandice Robinsons, Technician Referring MD:              Medicines:                Monitored Anesthesia Care Complications:            No immediate complications. Estimated Blood Loss:     Estimated blood loss was minimal. Procedure:                Pre-Anesthesia Assessment:                           - Prior to the procedure, a History and Physical                            was performed, and patient medications and                            allergies were reviewed. The patient's tolerance of                            previous anesthesia was also reviewed. The risks                            and benefits of the procedure and the sedation                            options and risks were discussed with the patient.                            All questions were answered, and informed consent                            was obtained. Prior Anticoagulants: The patient has                            taken Xarelto (rivaroxaban), last dose was 1 day                            prior to procedure. ASA Grade Assessment: III - A  patient with severe systemic disease. After                            reviewing the risks and benefits, the patient was                            deemed in satisfactory condition to undergo the                            procedure.                           After obtaining informed consent, the endoscope was                            passed under direct vision.  Throughout the                            procedure, the patient's blood pressure, pulse, and                            oxygen saturations were monitored continuously. The                            [id number] Olympus endoscope was introduced                            through the mouth, and advanced to the second part                            of duodenum. The upper GI endoscopy was                            accomplished without difficulty. The patient                            tolerated the procedure well. Findings:      LA Grade C (one or more mucosal breaks continuous between tops of 2 or       more mucosal folds, less than 75% circumference) esophagitis with no       bleeding was found in the lower third of the esophagus.      A 5 cm hiatal hernia was present.      Diffuse mild inflammation characterized by congestion (edema) and       erythema was found in the entire examined stomach. Biopsies were taken       with a cold forceps for histology. Estimated blood loss was minimal.      A single 8 mm sessile polyp with no bleeding and no stigmata of recent       bleeding was found in the gastric antrum. Biopsies were taken with a       cold forceps for histology. Estimated blood loss was minimal.      Multiple other small sessile polyps with no bleeding and no stigmata of       recent bleeding were found in the gastric fundus and in the  gastric       body. These were benign fundic gland polyps.      The examined duodenum was normal. Impression:               - LA Grade C esophagitis with no bleeding.                           - 5 cm hiatal hernia.                           - Gastritis. Biopsied.                           - A single gastric polyp. Biopsied.                           - Multiple gastric polyps.                           - Normal examined duodenum. Recommendation:           - Return patient to hospital ward for ongoing care.                           - Full  liquid diet now then advance diet as                            tolerated.                           - Use Protonix (pantoprazole) 40 mg PO BID for 6                            weeks then reduce to 40 mg daily.                           - Use sucralfate suspension 1 gram PO QID for 4                            weeks.                           - If needing to resume anticoagulation, can resume                            Xarelto (rivaroxaban) at prior dose in 2 days.                           - Await pathology results. Procedure Code(s):        --- Professional ---                           [phone], Esophagogastroduodenoscopy, flexible,                            transoral; with biopsy, single or multiple Diagnosis Code(s):        ---  Professional ---                           K20.90, Esophagitis, unspecified without bleeding                           K44.9, Diaphragmatic hernia without obstruction or                            gangrene                           K29.70, Gastritis, unspecified, without bleeding                           K31.7, Polyp of stomach and duodenum                           D62, Acute posthemorrhagic anemia                           K92.0, Hematemesis                           R11.2, Nausea with vomiting, unspecified CPT copyright 2019 [employer]. All rights reserved. The codes documented in this report are preliminary and upon coder review may  be revised to meet current compliance requirements. Gerrit Heck, MD 02/01/2021 3:09:21 PM Number of Addenda: 0

## 2021-02-01 NOTE — Progress Notes (Signed)
PROGRESS NOTE    Beth Santiago  [id number] DOB: August 27, 1924 DOA: 01/31/2021 PCP: Leanna Battles, MD   Brief Narrative:  HPI: Beth Santiago is a pleasant 87 y.o. female with medical history significant for heart block with pacer, coronary artery disease, chronic diastolic CHF, hypothyroidism, and atrial fibrillation on Xarelto, now presenting from her SNF for evaluation of coffee-ground emesis and drop in hemoglobin.  Patient was noted to have coffee-ground emesis at her nursing facility, Xarelto was placed on hold, and she had blood work performed with hemoglobin 7.5, lower than priors.  Patient was sent to the emergency department where she has no complaints, remembers vomiting earlier, denies abdominal pain, and was not sure why she was here.   ED Course: Upon arrival to the ED, patient is found to be afebrile, saturating well, and with systolic blood pressure [id number].  EKG features a paced rhythm.  Hemoglobin is 8.5, down from 11 last month, and fecal occult blood testing is positive.  Patient was given 40 mg IV Protonix in the ED and gastroenterology was consulted by the ED physician.  Assessment & Plan:   Principal Problem:   Upper GI bleeding Active Problems:   HYPERTENSION, BENIGN   CAD, NATIVE VESSEL   Atrial fibrillation (HCC)   Chronic diastolic CHF (congestive heart failure) (HCC)   Acute blood loss anemia  1. Acute upper GI bleeding/hematemesis/acute blood loss anemia: 1 episode of hematemesis at the facility.  Presented with hemoglobin of 11, previously 12.5.  Now dropped to 7.6.  Seen by GI.  1 unit of PRBC is being transfused.  She is all set for EGD today.  Monitor closely.  Transfuse to keep hemoglobin at 8.  Continue to hold anticoagulation.  Continue PPI.  Xarelto.   2. Atrial fibrillation: She appears to be taking Bystolic at home but currently she is slightly bradycardic with atrial fibrillation.  Holding Bystolic and Xarelto both.  3. Chronic diastolic  CHF: Appears euvolemic. EF was 55-60% on TTE in 2019   Continue to hold diuretics initially in light of bleeding and NPO status, monitor volume status     4. Hypertension: Blood pressure controlled.  Continue to hold antihypertensives.  5.  Acquired hypothyroidism: Continue Synthroid.  DVT prophylaxis: SCDs Start: 01/31/21 2202   Code Status: DNR  Family Communication:  None present at bedside.  Plan of care discussed with patient in length and he verbalized understanding and agreed with it.  Status is: Inpatient  Remains inpatient appropriate because: Needs further work-up for GI bleeding.  Estimated body mass index is 21.05 kg/m as calculated from the following:   Height as of this encounter: 5\' 8"  (1.727 m).   Weight as of this encounter: 62.8 kg.  Nutritional Assessment: Body mass index is 21.05 kg/m.Marland Kitchen Seen by dietician.  I agree with the assessment and plan as outlined below: Nutrition Status:   Skin Assessment: I have examined the patient's skin and I agree with the wound assessment as performed by the wound care RN as outlined below:    Consultants:  GI  Procedures:  None  Antimicrobials:  Anti-infectives (From admission, onward)    None          Subjective: Patient seen and examined.  She is slightly hard of hearing.  She has no new complaint.  Objective: Vitals:   02/01/21 0027 02/01/21 0033 02/01/21 0441 02/01/21 0841  BP: (!) 121/55  120/60 (!) 107/55  Pulse: 60  (!) 59 60  Resp: 18  18  19  Temp: 98.2 F (36.8 C)  98.3 F (36.8 C) 98.3 F (36.8 C)  TempSrc: Oral  Oral Oral  SpO2: 96%  96% 100%  Weight:  62.8 kg    Height:  5\' 8"  (1.727 m)      Intake/Output Summary (Last 24 hours) at 02/01/2021 1038 Last data filed at 02/01/2021 0500 Gross per 24 hour  Intake 120 ml  Output --  Net 120 ml   Filed Weights   01/31/21 1700 02/01/21 0033  Weight: 70 kg 62.8 kg    Examination:  General exam: Appears calm and comfortable   Respiratory system: Clear to auscultation. Respiratory effort normal. Cardiovascular system: S1 & S2 heard, RRR. No JVD, murmurs, rubs, gallops or clicks. No pedal edema. Gastrointestinal system: Abdomen is nondistended, soft and nontender. No organomegaly or masses felt. Normal bowel sounds heard. Central nervous system: Alert and oriented. No focal neurological deficits. Extremities: Symmetric 5 x 5 power. Skin: No rashes, lesions or ulcers   Data Reviewed: I have personally reviewed following labs and imaging studies  CBC: Recent Labs  Lab 01/31/21 2035 01/31/21 2051 02/01/21 0156 02/01/21 0837  WBC 11.9*  --   --   --   NEUTROABS 9.6*  --   --   --   HGB 8.5* 9.5* 8.2* 7.6*  HCT 27.4* 28.0* 26.4* 25.2*  MCV 91.0  --   --   --   PLT 199  --   --   --    Basic Metabolic Panel: Recent Labs  Lab 01/31/21 2035 01/31/21 2051 02/01/21 0156  NA 140 142 138  K 4.0 3.7 4.2  CL 113* 110 112*  CO2 21*  --  20*  GLUCOSE 116* 110* 130*  BUN 18 18 17   CREATININE 0.61 0.60 0.67  CALCIUM 8.9  --  8.6*   GFR: Estimated Creatinine Clearance: 42.6 mL/min (by C-G formula based on SCr of 0.67 mg/dL). Liver Function Tests: Recent Labs  Lab 01/31/21 2035  AST 20  ALT 8  ALKPHOS 55  BILITOT 0.6  PROT 5.6*  ALBUMIN 2.9*   No results for input(s): LIPASE, AMYLASE in the last 168 hours. No results for input(s): AMMONIA in the last 168 hours. Coagulation Profile: Recent Labs  Lab 01/31/21 2035  INR 1.5*   Cardiac Enzymes: No results for input(s): CKTOTAL, CKMB, CKMBINDEX, TROPONINI in the last 168 hours. BNP (last 3 results) No results for input(s): PROBNP in the last 8760 hours. HbA1C: No results for input(s): HGBA1C in the last 72 hours. CBG: No results for input(s): GLUCAP in the last 168 hours. Lipid Profile: No results for input(s): CHOL, HDL, LDLCALC, TRIG, CHOLHDL, LDLDIRECT in the last 72 hours. Thyroid Function Tests: No results for input(s): TSH, T4TOTAL,  FREET4, T3FREE, THYROIDAB in the last 72 hours. Anemia Panel: No results for input(s): VITAMINB12, FOLATE, FERRITIN, TIBC, IRON, RETICCTPCT in the last 72 hours. Sepsis Labs: No results for input(s): PROCALCITON, LATICACIDVEN in the last 168 hours.  Recent Results (from the past 240 hour(s))  Resp Panel by RT-PCR (Flu A&B, Covid) Nasopharyngeal Swab     Status: None   Collection Time: 01/31/21  9:20 PM   Specimen: Nasopharyngeal Swab; Nasopharyngeal(NP) swabs in vial transport medium  Result Value Ref Range Status   SARS Coronavirus 2 by RT PCR NEGATIVE NEGATIVE Final    Comment: (NOTE) SARS-CoV-2 target nucleic acids are NOT DETECTED.  The SARS-CoV-2 RNA is generally detectable in upper respiratory specimens during the acute phase of infection.  The lowest concentration of SARS-CoV-2 viral copies this assay can detect is 138 copies/mL. A negative result does not preclude SARS-Cov-2 infection and should not be used as the sole basis for treatment or other patient management decisions. A negative result may occur with  improper specimen collection/handling, submission of specimen other than nasopharyngeal swab, presence of viral mutation(s) within the areas targeted by this assay, and inadequate number of viral copies(<138 copies/mL). A negative result must be combined with clinical observations, patient history, and epidemiological information. The expected result is Negative.  Fact Sheet for Patients:  BloggerCourse.com  Fact Sheet for Healthcare Providers:  SeriousBroker.it  This test is no t yet approved or cleared by the [place] FDA and  has been authorized for detection and/or diagnosis of SARS-CoV-2 by FDA under an Emergency Use Authorization (EUA). This EUA will remain  in effect (meaning this test can be used) for the duration of the COVID-19 declaration under Section 564(b)(1) of the Act, 21 U.S.C.section  360bbb-3(b)(1), unless the authorization is terminated  or revoked sooner.       Influenza A by PCR NEGATIVE NEGATIVE Final   Influenza B by PCR NEGATIVE NEGATIVE Final    Comment: (NOTE) The Xpert Xpress SARS-CoV-2/FLU/RSV plus assay is intended as an aid in the diagnosis of influenza from Nasopharyngeal swab specimens and should not be used as a sole basis for treatment. Nasal washings and aspirates are unacceptable for Xpert Xpress SARS-CoV-2/FLU/RSV testing.  Fact Sheet for Patients: BloggerCourse.com  Fact Sheet for Healthcare Providers: SeriousBroker.it  This test is not yet approved or cleared by the [place] FDA and has been authorized for detection and/or diagnosis of SARS-CoV-2 by FDA under an Emergency Use Authorization (EUA). This EUA will remain in effect (meaning this test can be used) for the duration of the COVID-19 declaration under Section 564(b)(1) of the Act, 21 U.S.C. section 360bbb-3(b)(1), unless the authorization is terminated or revoked.  Performed at [facility], [address], [place], [place] [address]       Radiology Studies: No results found.  Scheduled Meds:  levothyroxine  88 mcg Oral Once per day on Sun Mon Wed Thu Fri Sat   And   [START ON 02/04/2021] levothyroxine  44 mcg Oral Once per day on Tue   pantoprazole (PROTONIX) IV  40 mg Intravenous Q12H   polyvinyl alcohol  2 drop Both Eyes Daily   sodium chloride flush  3 mL Intravenous Q12H   timolol  1 drop Both Eyes Daily   Continuous Infusions:   LOS: 1 day   Time spent: 34 minutes   Hughie Closs, MD [facility]  02/01/2021, 10:38 AM  Please page via Loretha Stapler and do not message via secure chat for anything urgent. Secure chat can be used for anything non urgent.  How to contact the [facility] Attending or Consulting provider 7A - 7P or covering provider during after hours 7P -7A, for this patient?  Check the care  team in [facility] and look for a) attending/consulting [facility] provider listed and b) the [facility] team listed. Page or secure chat 7A-7P. Log into [email or web address] and use [facility] universal password to access. If you do not have the password, please contact the hospital operator. Locate the [facility] provider you are looking for under [facility] and page to a number that you can be directly reached. If you still have difficulty reaching the provider, please page the [facility] (Director on Call) for the Hospitalists listed on amion for assistance.

## 2021-02-01 NOTE — Interval H&P Note (Signed)
History and Physical Interval Note:  02/01/2021 2:38 PM  Beth Santiago  has presented today for surgery, with the diagnosis of GI bleed.  The various methods of treatment have been discussed with the patient and family. After consideration of risks, benefits and other options for treatment, the patient has consented to  Procedure(s): ESOPHAGOGASTRODUODENOSCOPY (EGD) WITH PROPOFOL (N/A) as a surgical intervention.  The patient's history has been reviewed, patient examined, no change in status, stable for surgery.  I have reviewed the patient's chart and labs.  Questions were answered to the patient's satisfaction.     Verlin Dike Shernell Saldierna

## 2021-02-01 NOTE — Plan of Care (Signed)
?  Problem: Coping: ?Goal: Level of anxiety will decrease ?Outcome: Progressing ?  ?Problem: Safety: ?Goal: Ability to remain free from injury will improve ?Outcome: Progressing ?  ?

## 2021-02-01 NOTE — H&P (View-Only) (Signed)
Attending physician's note   I have taken an interval history, reviewed the chart and examined the patient. I agree with the Advanced Practitioner's note, impression, and recommendations as outlined.   87 year old female with medical history as outlined below to include history of heart block with pacer in place, atrial fibrillation (on Xarelto), CAD, CHF, presenting from SNF with reported coffee-ground emesis.  Patient does not recall those recent events.  No nausea/vomiting since arrival.  Admission labs notable for the following:  - H/H 8.5/27.4 with MCV/RDW 91/17.8 - INR 1.5 - BUN/creatinine 18/0.6 - FOBT positive  Labs from 12/30/2020: - H/H 11/34 with MCV/RDW 86/14  1) Coffee-ground emesis 2) Acute blood loss anemia 3) Atrial fibrillation 4) Chronic anticoagulation 5) Nausea/Vomiting  - Started on high-dose PPI on arrival - Xarelto held on arrival.  Last dose was 2 days ago - Plan for expedited EGD for diagnostic and therapeutic intent today - 1 unit PRBCs was ordered.  Apparently family was declining.    [address] DO, [place] [phone] office         Consultation  Referring Provider: [facility]/ Pahwani MD Primary Care Physician:  Jarome Matin, MD Primary Gastroenterologist:  none  Reason for Consultation:  coffee ground emesis  HPI: Beth Santiago is a 87 y.o. female, unassigned who was admitted from [facility] nursing home last evening with 2-day history of coffee-ground emesis.  She is on chronic Xarelto with history of atrial fibs/atrial flutter, has history of GERD, congestive heart failure/last EF 55% 2019, carotid disease, history of complete heart block status post pacemaker placement and history of hypertension. Patient does not believe she has ever had any problems with GI bleeding or ulcer disease.  No prior EGD to her knowledge.  Has had remote colonoscopy. She is very pleasant but a poor historian, does not remember today having nausea  vomiting earlier this week.  She has no complaints of heartburn or indigestion, no dysphagia or odynophagia.  No complaints of abdominal pain, unaware of any melena or hematochezia. She is on a PPI chronically. She has been hemodynamically stable, no further coffee-ground emesis since admission. Review of labs shows hemoglobin 12.5 August 20202, hemoglobin 11 12/30/2020 Hemoglobin 8.5/hematocrit 27.4 yesterday/WBC 11.9/platelets 199 INR 1.5 yesterday BUN 18/creatinine 0.61  Hemoglobin 7.6 this a.m.  Per notes, patient did not have Xarelto yesterday at the nursing home.   Past Medical History:  Diagnosis Date  . Atrial flutter (HCC)    a. Noted during pacemaker implantation 2011, spontaneously terminated.  . Biatrial enlargement    severe  . CAD (coronary artery disease)    a. Nonobstructive by cath 2005 - 40-50% mid LAD. b. Nonischemic nuc 05/2011.  . Carotid artery disease (HCC)    a. 1-50% bilateral (upper end) - March 2011.  Marland Kitchen CHF (congestive heart failure) (HCC)   . Complete heart block (HCC)    a. s/p PPM 2005. b. Gen change to [employer] by Dr Johney Frame  9/11 for premature ERI 11/2009.  Marland Kitchen DJD (degenerative joint disease)   . GERD (gastroesophageal reflux disease)   . Hemoptysis 05/2016  . Hyperlipidemia   . Hypertension   . Hypothyroid   . Permanent atrial fibrillation (HCC) 05/2011  . Severe tricuspid regurgitation     Past Surgical History:  Procedure Laterality Date  . ABDOMINAL SURGERY     twisted bowel  . CARDIOVERSION  06/11/2011   Procedure: CARDIOVERSION;  Surgeon: Dolores Patty, MD;  Location: [facility] ENDOSCOPY;  Service: Cardiovascular;  Laterality: N/A;  . CARDIOVERSION N/A 01/04/2013   Procedure: CARDIOVERSION;  Surgeon: Dorothy Spark, MD;  Location: [place];  Service: Cardiovascular;  Laterality: N/A;  . COLON SURGERY    . HEMORROIDECTOMY    . PACEMAKER INSERTION  2005, 12/11/09   implanted by Dr Verlon Setting, generator change 9/11 by Greggory Brandy for premature ERI   . PPM GENERATOR CHANGEOUT N/A 01/08/2021   Procedure: PPM GENERATOR CHANGEOUT;  Surgeon: Thompson Grayer, MD;  Location: [place] CV LAB;  Service: Cardiovascular;  Laterality: N/A;  . TEE WITHOUT CARDIOVERSION  06/11/2011   Procedure: TRANSESOPHAGEAL ECHOCARDIOGRAM (TEE);  Surgeon: Jolaine Artist, MD;  Location: [facility] ENDOSCOPY;  Service: Cardiovascular;  Laterality: N/A;  . TEE WITHOUT CARDIOVERSION N/A 05/27/2017   Procedure: TRANSESOPHAGEAL ECHOCARDIOGRAM (TEE);  Surgeon: Jerline Pain, MD;  Location: [facility] ENDOSCOPY;  Service: Cardiovascular;  Laterality: N/A;  . TONSILLECTOMY      Prior to Admission medications   Medication Sig Start Date End Date Taking? Authorizing Provider  acetaminophen (TYLENOL) 500 MG tablet Take 500 mg by mouth in the morning, at noon, and at bedtime. (0900, 1300 & 2100)   Yes [provider]  BYSTOLIC 5 MG tablet TAKE 1/2 TABLET BY MOUTH DAILY Patient taking differently: Take 2.5 mg by mouth daily. 04/20/19  Yes Baldwin Jamaica, PA-C  carboxymethylcellulose (ARTIFICIAL TEARS) 1 % ophthalmic solution Place 2 drops into both eyes in the morning and at bedtime. (0900 & 2100)   Yes [provider]  Cholecalciferol (VITAMIN D-3) 125 MCG (5000 UT) TABS Take 5,000 Units by mouth daily.   Yes [provider]  ferrous sulfate 325 (65 FE) MG tablet Take 325 mg by mouth daily.   Yes [provider]  furosemide (LASIX) 40 MG tablet Take 20 mg by mouth daily.   Yes [provider]  levothyroxine (SYNTHROID, LEVOTHROID) 88 MCG tablet Take 44-88 mcg by mouth See admin instructions. 44 mcg by mouth on Tuesdays at 0630, and 88 mcg by mouth all other days of the week at 0630 03/13/14  Yes [provider]  Multiple Vitamin (MULTIVITAMIN WITH MINERALS) TABS tablet Take 1 tablet by mouth daily.   Yes [provider]  pantoprazole (PROTONIX) 40 MG tablet Take 40 mg by mouth 2 (two) times daily.   Yes [provider]   potassium chloride SA (KLOR-CON) 20 MEQ tablet TAKE 1 TABLET BY MOUTH  DAILY AND TAKE 1 EXTRA  TABLET WHEN YOU TAKE EXTRA  LASIX Patient taking differently: Take 20 mEq by mouth daily at 6 (six) AM. 03/06/19  Yes Bensimhon, Shaune Pascal, MD  Rivaroxaban (XARELTO) 15 MG TABS tablet Take 1 tablet (15 mg total) by mouth daily with supper. 01/10/21  Yes Allred, Jeneen Rinks, MD  sodium chloride 0.9 % infusion Inject 60 mLs into the vein 2 (two) times daily as needed (hydration). 40ml/hour 01/29/21  Yes [provider]  spironolactone (ALDACTONE) 25 MG tablet Take 12.5 mg by mouth daily.   Yes [provider]  sucralfate (CARAFATE) 1 GM/10ML suspension Take 1 g by mouth 4 (four) times daily -  with meals and at bedtime. For 2 weeks 01/30/21 02/12/21 Yes [provider]  timolol (TIMOPTIC) 0.5 % ophthalmic solution Place 1 drop into both eyes daily. 01/03/21  Yes [provider]    Current Facility-Administered Medications  Medication Dose Route Frequency Provider Last Rate Last Admin  . acetaminophen (TYLENOL) tablet 650 mg  650 mg Oral Q6H PRN Opyd, Ilene Qua, MD  650 mg at 02/01/21 0448   Or  . acetaminophen (TYLENOL) suppository 650 mg  650 mg Rectal Q6H PRN Opyd, Lavone Neri, MD      . levothyroxine (SYNTHROID) tablet 88 mcg  88 mcg Oral Once per day on Sun Mon Wed Thu Fri Sat Briscoe Deutscher, MD   88 mcg at 02/01/21 0636   And  . [START ON 02/04/2021] levothyroxine (SYNTHROID) tablet 44 mcg  44 mcg Oral Once per day on Tue Opyd, Timothy S, MD      . ondansetron [facility]) tablet 4 mg  4 mg Oral Q6H PRN Opyd, Lavone Neri, MD       Or  . ondansetron (ZOFRAN) injection 4 mg  4 mg Intravenous Q6H PRN Opyd, Lavone Neri, MD      . pantoprazole (PROTONIX) injection 40 mg  40 mg Intravenous Q12H Opyd, Lavone Neri, MD   40 mg at 02/01/21 0636  . polyvinyl alcohol (LIQUIFILM TEARS) 1.4 % ophthalmic solution 2 drop  2 drop Both Eyes Daily Opyd, Timothy S, MD      . sodium chloride flush  (NS) 0.9 % injection 3 mL  3 mL Intravenous Q12H Opyd, Lavone Neri, MD   3 mL at 01/31/21 2219  . timolol (TIMOPTIC) 0.5 % ophthalmic solution 1 drop  1 drop Both Eyes Daily Opyd, Lavone Neri, MD        Allergies as of 01/31/2021  . (No Known Allergies)    Family History  Problem Relation Age of Onset  . Cancer Mother   . Diabetes Mother   . Hypertension Mother   . Heart disease Father   . Cancer Brother   . Heart disease Brother     Social History   Socioeconomic History  . Marital status: Widowed    Spouse name: Not on file  . Number of children: Not on file  . Years of education: Not on file  . Highest education level: Not on file  Occupational History  . Not on file  Tobacco Use  . Smoking status: Never  . Smokeless tobacco: Never  Vaping Use  . Vaping Use: Never used  Substance and Sexual Activity  . Alcohol use: Yes    Comment: 05/2016    OCCASIONAL   . Drug use: No  . Sexual activity: Not on file  Other Topics Concern  . Not on file  Social History Narrative  . Not on file   Social Determinants of Health   Financial Resource Strain: Not on file  Food Insecurity: Not on file  Transportation Needs: Not on file  Physical Activity: Not on file  Stress: Not on file  Social Connections: Not on file  Intimate Partner Violence: Not on file    Review of Systems: Pertinent positive and negative review of systems were noted in the above HPI section.  All other review of systems was otherwise negative.  Physical Exam: Vital signs in last 24 hours: Temp:  [97.7 F (36.5 C)-98.3 F (36.8 C)] 98.3 F (36.8 C) (11/12 0441) Pulse Rate:  [59-83] 59 (11/12 0441) Resp:  [15-27] 18 (11/12 0441) BP: (104-138)/(48-91) 120/60 (11/12 0441) SpO2:  [95 %-100 %] 96 % (11/12 0441) Weight:  [62.8 kg-70 kg] 62.8 kg (11/12 0033) Last BM Date: 01/31/21 General:   Alert,  Well-developed, well-nourished, very elderly white female pleasant and cooperative in NAD, a bit hard of  hearing Head:  Normocephalic and atraumatic. Eyes:  Sclera clear, no icterus.   Conjunctiva pale Ears:  Normal auditory acuity. Nose:  No deformity, discharge,  or lesions. Mouth:  No deformity or lesions.   Neck:  Supple; no masses or thyromegaly. Lungs:  Clear throughout to auscultation.   No wheezes, crackles, or rhonchi.  Heart:  Regular rate and rhythm; murmur present Abdomen:  Soft,nontender, BS active,nonpalp mass or hsm.   Rectal: Not done today documented Hemoccult positive on admit Msk:  Symmetrical without gross deformities. . Pulses:  Normal pulses noted. Extremities:  Without clubbing or edema. Neurologic:  Alert and  oriented x4;  grossly normal neurologically. Skin:  Intact without significant lesions or rashes.. Psych:  Alert and cooperative. Normal mood and affect.  Intake/Output from previous day: 11/11 0701 - 11/12 0700 In: 120 [P.O.:120] Out: -  Intake/Output this shift: No intake/output data recorded.  Lab Results: Recent Labs    01/31/21 2035 01/31/21 2051 02/01/21 0156  WBC 11.9*  --   --   HGB 8.5* 9.5* 8.2*  HCT 27.4* 28.0* 26.4*  PLT 199  --   --    BMET Recent Labs    01/31/21 2035 01/31/21 2051 02/01/21 0156  NA 140 142 138  K 4.0 3.7 4.2  CL 113* 110 112*  CO2 21*  --  20*  GLUCOSE 116* 110* 130*  BUN 18 18 17   CREATININE 0.61 0.60 0.67  CALCIUM 8.9  --  8.6*   LFT Recent Labs    01/31/21 2035  PROT 5.6*  ALBUMIN 2.9*  AST 20  ALT 8  ALKPHOS 55  BILITOT 0.6   PT/INR Recent Labs    01/31/21 2035  LABPROT 17.9*  INR 1.5*   Hepatitis Panel No results for input(s): HEPBSAG, HCVAB, HEPAIGM, HEPBIGM in the last 72 hours.    IMPRESSION:  #74  87 year old female admitted with 2-day history of coffee-ground emesis in setting of chronic Xarelto With history of GERD and on PPI therapy Rule out erosive esophagitis, rule out gastritis/peptic ulcer disease, rule out neoplasm  Patient has been hemodynamically stable, did not  receive Xarelto yesterday  #2 anemia secondary to above, hemoglobin down 2 g since admit #3 chronic anticoagulation-on Xarelto #4 history of atrial fibrillation/atrial flutter #5 history of complete heart block status post pacemaker placement #6 congestive heart failure/preserved EF 55% 2019 #7 carotid artery stenosis #8  Hypertension   PLAN: Keep n.p.o. Continue IV PPI BID Continue to hold Xarelto Transfuse 1 unit of packed RBCs, then keep hemoglobin closer to 8 given advanced age , Discussed EGD in detail with the patient, including indications risk and benefits and she is agreeable to proceed Patient will be scheduled for EGD today with Dr. Bryan Lemma. Further recommendations pending findings   Amy Esterwood PA-C 02/01/2021, 8:41 AM

## 2021-02-01 NOTE — Anesthesia Postprocedure Evaluation (Signed)
Anesthesia Post Note  Patient: YENIFER SACCENTE  Procedure(s) Performed: ESOPHAGOGASTRODUODENOSCOPY (EGD) WITH PROPOFOL BIOPSY     Patient location during evaluation: Endoscopy Anesthesia Type: MAC Level of consciousness: patient cooperative and awake Pain management: pain level controlled Vital Signs Assessment: post-procedure vital signs reviewed and stable Respiratory status: spontaneous breathing, nonlabored ventilation, respiratory function stable and patient connected to nasal cannula oxygen Cardiovascular status: stable and blood pressure returned to baseline Postop Assessment: no apparent nausea or vomiting Anesthetic complications: no   No notable events documented.  Last Vitals:  Vitals:   02/01/21 1530 02/01/21 1540  BP:  (!) 117/51  Pulse: (!) 59 (!) 59  Resp: (!) 22 19  Temp:    SpO2: 100% 99%    Last Pain:  Vitals:   02/01/21 1530  TempSrc:   PainSc: 0-No pain                 Oluwatamilore Starnes

## 2021-02-01 NOTE — Consult Note (Addendum)
   Attending physician's note   I have taken an interval history, reviewed the chart and examined the patient. I agree with the Advanced Practitioner's note, impression, and recommendations as outlined.   87-year-old female with medical history as outlined below to include history of heart block with pacer in place, atrial fibrillation (on Xarelto), CAD, CHF, presenting from SNF with reported coffee-ground emesis.  Patient does not recall those recent events.  No nausea/vomiting since arrival.  Admission labs notable for the following:  - H/H 8.5/27.4 with MCV/RDW 91/17.8 - INR 1.5 - BUN/creatinine 18/0.6 - FOBT positive  Labs from 12/30/2020: - H/H 11/34 with MCV/RDW 86/14  1) Coffee-ground emesis 2) Acute blood loss anemia 3) Atrial fibrillation 4) Chronic anticoagulation 5) Nausea/Vomiting  - Started on high-dose PPI on arrival - Xarelto held on arrival.  Last dose was 2 days ago - Plan for expedited EGD for diagnostic and therapeutic intent today - 1 unit PRBCs was ordered.  Apparently family was declining.    Dezaray Shibuya, DO, FACG [phone] office         Consultation  Referring Provider: TRH/ Pahwani MD Primary Care Physician:  Paterson, Daniel, MD Primary Gastroenterologist:  none  Reason for Consultation:  coffee ground emesis  HPI: Beth Santiago is a 87 y.o. female, unassigned who was admitted from [facility] nursing home last evening with 2-day history of coffee-ground emesis.  She is on chronic Xarelto with history of atrial fibs/atrial flutter, has history of GERD, congestive heart failure/last EF 55% 2019, carotid disease, history of complete heart block status post pacemaker placement and history of hypertension. Patient does not believe she has ever had any problems with GI bleeding or ulcer disease.  No prior EGD to her knowledge.  Has had remote colonoscopy. She is very pleasant but a poor historian, does not remember today having nausea  vomiting earlier this week.  She has no complaints of heartburn or indigestion, no dysphagia or odynophagia.  No complaints of abdominal pain, unaware of any melena or hematochezia. She is on a PPI chronically. She has been hemodynamically stable, no further coffee-ground emesis since admission. Review of labs shows hemoglobin 12.5 August 20202, hemoglobin 11 12/30/2020 Hemoglobin 8.5/hematocrit 27.4 yesterday/WBC 11.9/platelets 199 INR 1.5 yesterday BUN 18/creatinine 0.61  Hemoglobin 7.6 this a.m.  Per notes, patient did not have Xarelto yesterday at the nursing home.   Past Medical History:  Diagnosis Date  . Atrial flutter (HCC)    a. Noted during pacemaker implantation 2011, spontaneously terminated.  . Biatrial enlargement    severe  . CAD (coronary artery disease)    a. Nonobstructive by cath 2005 - 40-50% mid LAD. b. Nonischemic nuc 05/2011.  . Carotid artery disease (HCC)    a. 1-50% bilateral (upper end) - March 2011.  . CHF (congestive heart failure) (HCC)   . Complete heart block (HCC)    a. s/p PPM 2005. b. Gen change to Medtronic by Dr Allred  9/11 for premature ERI 11/2009.  . DJD (degenerative joint disease)   . GERD (gastroesophageal reflux disease)   . Hemoptysis 05/2016  . Hyperlipidemia   . Hypertension   . Hypothyroid   . Permanent atrial fibrillation (HCC) 05/2011  . Severe tricuspid regurgitation     Past Surgical History:  Procedure Laterality Date  . ABDOMINAL SURGERY     twisted bowel  . CARDIOVERSION  06/11/2011   Procedure: CARDIOVERSION;  Surgeon: Daniel R Bensimhon, MD;  Location: [facility];  Service: Cardiovascular;    Laterality: N/A;  . CARDIOVERSION N/A 01/04/2013   Procedure: CARDIOVERSION;  Surgeon: Dorothy Spark, MD;  Location: [facility];  Service: Cardiovascular;  Laterality: N/A;  . COLON SURGERY    . HEMORROIDECTOMY    . PACEMAKER INSERTION  2005, 12/11/09   implanted by Dr Verlon Setting, generator change 9/11 by Greggory Brandy for premature ERI   . PPM GENERATOR CHANGEOUT N/A 01/08/2021   Procedure: PPM GENERATOR CHANGEOUT;  Surgeon: Thompson Grayer, MD;  Location: [facility];  Service: Cardiovascular;  Laterality: N/A;  . TEE WITHOUT CARDIOVERSION  06/11/2011   Procedure: TRANSESOPHAGEAL ECHOCARDIOGRAM (TEE);  Surgeon: Jolaine Artist, MD;  Location[facility] ENDOSCOPY;  Service: Cardiovascular;  Laterality: N/A;  . TEE WITHOUT CARDIOVERSION N/A 05/27/2017   Procedure: TRANSESOPHAGEAL ECHOCARDIOGRAM (TEE);  Surgeon: Jerline Pain, MD;  Location: [facility] ENDOSCOPY;  Service: Cardiovascular;  Laterality: N/A;  . TONSILLECTOMY      Prior to Admission medications   Medication Sig Start Date End Date Taking? Authorizing Provider  acetaminophen (TYLENOL) 500 MG tablet Take 500 mg by mouth in the morning, at noon, and at bedtime. (0900, 1300 & 2100)   Yes [provider]  BYSTOLIC 5 MG tablet TAKE 1/2 TABLET BY MOUTH DAILY Patient taking differently: Take 2.5 mg by mouth daily. 04/20/19  Yes Baldwin Jamaica, PA-C  carboxymethylcellulose (ARTIFICIAL TEARS) 1 % ophthalmic solution Place 2 drops into both eyes in the morning and at bedtime. (0900 & 2100)   Yes [provider]  Cholecalciferol (VITAMIN D-3) 125 MCG (5000 UT) TABS Take 5,000 Units by mouth daily.   Yes [provider]  ferrous sulfate 325 (65 FE) MG tablet Take 325 mg by mouth daily.   Yes [provider]  furosemide (LASIX) 40 MG tablet Take 20 mg by mouth daily.   Yes [provider]  levothyroxine (SYNTHROID, LEVOTHROID) 88 MCG tablet Take 44-88 mcg by mouth See admin instructions. 44 mcg by mouth on Tuesdays at 0630, and 88 mcg by mouth all other days of the week at 0630 03/13/14  Yes [provider]  Multiple Vitamin (MULTIVITAMIN WITH MINERALS) TABS tablet Take 1 tablet by mouth daily.   Yes [provider]  pantoprazole (PROTONIX) 40 MG tablet Take 40 mg by mouth 2 (two) times daily.   Yes [provider]   potassium chloride SA (KLOR-CON) 20 MEQ tablet TAKE 1 TABLET BY MOUTH  DAILY AND TAKE 1 EXTRA  TABLET WHEN YOU TAKE EXTRA  LASIX Patient taking differently: Take 20 mEq by mouth daily at 6 (six) AM. 03/06/19  Yes Bensimhon, Shaune Pascal, MD  Rivaroxaban (XARELTO) 15 MG TABS tablet Take 1 tablet (15 mg total) by mouth daily with supper. 01/10/21  Yes Allred, Jeneen Rinks, MD  sodium chloride 0.9 % infusion Inject 60 mLs into the vein 2 (two) times daily as needed (hydration). 57ml/hour 01/29/21  Yes [provider]  spironolactone (ALDACTONE) 25 MG tablet Take 12.5 mg by mouth daily.   Yes [provider]  sucralfate (CARAFATE) 1 GM/10ML suspension Take 1 g by mouth 4 (four) times daily -  with meals and at bedtime. For 2 weeks 01/30/21 02/12/21 Yes [provider]  timolol (TIMOPTIC) 0.5 % ophthalmic solution Place 1 drop into both eyes daily. 01/03/21  Yes [provider]    Current Facility-Administered Medications  Medication Dose Route Frequency Provider Last Rate Last Admin  . acetaminophen (TYLENOL) tablet 650 mg  650 mg Oral Q6H PRN Opyd, Ilene Qua, MD  650 mg at 02/01/21 0448   Or  . acetaminophen (TYLENOL) suppository 650 mg  650 mg Rectal Q6H PRN Opyd, Lavone Neri, MD      . levothyroxine (SYNTHROID) tablet 88 mcg  88 mcg Oral Once per day on Sun Mon Wed Thu Fri Sat Briscoe Deutscher, MD   88 mcg at 02/01/21 0636   And  . [START ON 02/04/2021] levothyroxine (SYNTHROID) tablet 44 mcg  44 mcg Oral Once per day on Tue Opyd, Timothy S, MD      . ondansetron [facility]) tablet 4 mg  4 mg Oral Q6H PRN Opyd, Lavone Neri, MD       Or  . ondansetron (ZOFRAN) injection 4 mg  4 mg Intravenous Q6H PRN Opyd, Lavone Neri, MD      . pantoprazole (PROTONIX) injection 40 mg  40 mg Intravenous Q12H Opyd, Lavone Neri, MD   40 mg at 02/01/21 0636  . polyvinyl alcohol (LIQUIFILM TEARS) 1.4 % ophthalmic solution 2 drop  2 drop Both Eyes Daily Opyd, Timothy S, MD      . sodium chloride flush  (NS) 0.9 % injection 3 mL  3 mL Intravenous Q12H Opyd, Lavone Neri, MD   3 mL at 01/31/21 2219  . timolol (TIMOPTIC) 0.5 % ophthalmic solution 1 drop  1 drop Both Eyes Daily Opyd, Lavone Neri, MD        Allergies as of 01/31/2021  . (No Known Allergies)    Family History  Problem Relation Age of Onset  . Cancer Mother   . Diabetes Mother   . Hypertension Mother   . Heart disease Father   . Cancer Brother   . Heart disease Brother     Social History   Socioeconomic History  . Marital status: Widowed    Spouse name: Not on file  . Number of children: Not on file  . Years of education: Not on file  . Highest education level: Not on file  Occupational History  . Not on file  Tobacco Use  . Smoking status: Never  . Smokeless tobacco: Never  Vaping Use  . Vaping Use: Never used  Substance and Sexual Activity  . Alcohol use: Yes    Comment: 05/2016    OCCASIONAL   . Drug use: No  . Sexual activity: Not on file  Other Topics Concern  . Not on file  Social History Narrative  . Not on file   Social Determinants of Health   Financial Resource Strain: Not on file  Food Insecurity: Not on file  Transportation Needs: Not on file  Physical Activity: Not on file  Stress: Not on file  Social Connections: Not on file  Intimate Partner Violence: Not on file    Review of Systems: Pertinent positive and negative review of systems were noted in the above HPI section.  All other review of systems was otherwise negative.  Physical Exam: Vital signs in last 24 hours: Temp:  [97.7 F (36.5 C)-98.3 F (36.8 C)] 98.3 F (36.8 C) (11/12 0441) Pulse Rate:  [59-83] 59 (11/12 0441) Resp:  [15-27] 18 (11/12 0441) BP: (104-138)/(48-91) 120/60 (11/12 0441) SpO2:  [95 %-100 %] 96 % (11/12 0441) Weight:  [62.8 kg-70 kg] 62.8 kg (11/12 0033) Last BM Date: 01/31/21 General:   Alert,  Well-developed, well-nourished, very elderly white female pleasant and cooperative in NAD, a bit hard of  hearing Head:  Normocephalic and atraumatic. Eyes:  Sclera clear, no icterus.   Conjunctiva pale Ears:  Normal auditory acuity. Nose:  No deformity, discharge,  or lesions. Mouth:  No deformity or lesions.   Neck:  Supple; no masses or thyromegaly. Lungs:  Clear throughout to auscultation.   No wheezes, crackles, or rhonchi.  Heart:  Regular rate and rhythm; murmur present Abdomen:  Soft,nontender, BS active,nonpalp mass or hsm.   Rectal: Not done today documented Hemoccult positive on admit Msk:  Symmetrical without gross deformities. . Pulses:  Normal pulses noted. Extremities:  Without clubbing or edema. Neurologic:  Alert and  oriented x4;  grossly normal neurologically. Skin:  Intact without significant lesions or rashes.. Psych:  Alert and cooperative. Normal mood and affect.  Intake/Output from previous day: 11/11 0701 - 11/12 0700 In: 120 [P.O.:120] Out: -  Intake/Output this shift: No intake/output data recorded.  Lab Results: Recent Labs    01/31/21 2035 01/31/21 2051 02/01/21 0156  WBC 11.9*  --   --   HGB 8.5* 9.5* 8.2*  HCT 27.4* 28.0* 26.4*  PLT 199  --   --    BMET Recent Labs    01/31/21 2035 01/31/21 2051 02/01/21 0156  NA 140 142 138  K 4.0 3.7 4.2  CL 113* 110 112*  CO2 21*  --  20*  GLUCOSE 116* 110* 130*  BUN 18 18 17   CREATININE 0.61 0.60 0.67  CALCIUM 8.9  --  8.6*   LFT Recent Labs    01/31/21 2035  PROT 5.6*  ALBUMIN 2.9*  AST 20  ALT 8  ALKPHOS 55  BILITOT 0.6   PT/INR Recent Labs    01/31/21 2035  LABPROT 17.9*  INR 1.5*   Hepatitis Panel No results for input(s): HEPBSAG, HCVAB, HEPAIGM, HEPBIGM in the last 72 hours.    IMPRESSION:  #32  87 year old female admitted with 2-day history of coffee-ground emesis in setting of chronic Xarelto With history of GERD and on PPI therapy Rule out erosive esophagitis, rule out gastritis/peptic ulcer disease, rule out neoplasm  Patient has been hemodynamically stable, did not  receive Xarelto yesterday  #2 anemia secondary to above, hemoglobin down 2 g since admit #3 chronic anticoagulation-on Xarelto #4 history of atrial fibrillation/atrial flutter #5 history of complete heart block status post pacemaker placement #6 congestive heart failure/preserved EF 55% 2019 #7 carotid artery stenosis #8  Hypertension   PLAN: Keep n.p.o. Continue IV PPI BID Continue to hold Xarelto Transfuse 1 unit of packed RBCs, then keep hemoglobin closer to 8 given advanced age , Discussed EGD in detail with the patient, including indications risk and benefits and she is agreeable to proceed Patient will be scheduled for EGD today with Dr. Bryan Lemma. Further recommendations pending findings   Amy Esterwood PA-C 02/01/2021, 8:41 AM

## 2021-02-02 DIAGNOSIS — K2970 Gastritis, unspecified, without bleeding: Secondary | ICD-10-CM

## 2021-02-02 DIAGNOSIS — K209 Esophagitis, unspecified without bleeding: Secondary | ICD-10-CM

## 2021-02-02 DIAGNOSIS — K922 Gastrointestinal hemorrhage, unspecified: Secondary | ICD-10-CM | POA: Diagnosis not present

## 2021-02-02 LAB — BASIC METABOLIC PANEL
Anion gap: 4 — ABNORMAL LOW (ref 5–15)
BUN: 14 mg/dL (ref 8–23)
CO2: 21 mmol/L — ABNORMAL LOW (ref 22–32)
Calcium: 8.5 mg/dL — ABNORMAL LOW (ref 8.9–10.3)
Chloride: 110 mmol/L (ref 98–111)
Creatinine, Ser: 0.59 mg/dL (ref 0.44–1.00)
GFR, Estimated: >60 mL/min (ref >60)
Glucose, Bld: 113 mg/dL — ABNORMAL HIGH (ref 70–99)
Potassium: 4.1 mmol/L (ref 3.5–5.1)
Sodium: 135 mmol/L (ref 135–145)

## 2021-02-02 LAB — HEMOGLOBIN
Hemoglobin: 8.4 g/dL — ABNORMAL LOW (ref 12.0–15.0)
Hemoglobin: 8.6 g/dL — ABNORMAL LOW (ref 12.0–15.0)

## 2021-02-02 LAB — HEMATOCRIT
HCT: 27.5 % — ABNORMAL LOW (ref 36.0–46.0)
HCT: 28.8 % — ABNORMAL LOW (ref 36.0–46.0)

## 2021-02-02 MED ORDER — RIVAROXABAN 15 MG PO TABS: 15.0000 mg | ORAL_TABLET | Freq: Every day | ORAL | 0 refills | Status: AC

## 2021-02-02 MED ORDER — PANTOPRAZOLE SODIUM 40 MG PO TBEC: 40.0000 mg | DELAYED_RELEASE_TABLET | Freq: Two times a day (BID) | ORAL | 0 refills | Status: DC

## 2021-02-02 MED ORDER — OMEPRAZOLE 40 MG PO CPDR: 40.0000 mg | DELAYED_RELEASE_CAPSULE | Freq: Two times a day (BID) | ORAL | 0 refills | Status: DC

## 2021-02-02 NOTE — Progress Notes (Signed)
Report called to April at [facility].   Patient discharged: to [facility] Left via: stretcher Discharge paperwork reviewed and given: in package.  IV and telemetry disconnected. Belongings given to patient's son, Jonny Ruiz.

## 2021-02-02 NOTE — Social Work (Signed)
CSW was alerted that pt was ready for dc and was from a SNF. Pt is from [facility]. CSW met with pt and pt's son Jenny Reichmann and they confirmed that pt was a LTC resident of [facility] and the plan was for her to return.  CSW spoke with April at [facility] and she confirmed that pt could return with DC summary.No COVID test needed.  CSW will call for transport.

## 2021-02-02 NOTE — TOC Initial Note (Deleted)
Transition of Care [facility]) - Initial/Assessment Note    Patient Details  Name: Beth Santiago MRN: [id number] Date of Birth: 31-Jan-1925  [facility]) CM/SW Contact:    Patrice Paradise, LCSW Phone Number:[phone] 02/02/2021, 10:49 AM  Clinical Narrative:                 Patient will DC to:?[facility] Anticipated DC date:?02/02/2021 Family notified:?John Transport by: Sharin Mons   Per MD patient ready for DC to [facility]. RN, patient, patient's family, and facility notified of DC. Discharge Summary sent to facility. RN given number for report   [phone]. DC packet on chart. Ambulance transport requested for patient.   CSW signing off.   Judd Lien, [place] [phone]         Patient Goals and CMS Choice        Expected Discharge Plan and Services           Expected Discharge Date: 02/02/21                                    Prior Living Arrangements/Services                       Activities of Daily Living      Permission Sought/Granted                  Emotional Assessment              Admission diagnosis:  Upper GI bleeding [K92.2] Acute upper GI bleeding [K92.2] Patient Active Problem List   Diagnosis Date Noted   Esophagitis 02/02/2021   Gastritis 02/02/2021   Coffee ground emesis    Hiatal hernia    Upper GI bleeding 01/31/2021   Acute blood loss anemia 01/31/2021   Enterococcal bacteremia 06/30/2017   Chronic diastolic CHF (congestive heart failure) (HCC) 05/25/2017   Elevated troponin 06/24/2016   Lactic acidosis 06/24/2016   Hypokalemia 06/24/2016   Hyponatremia 06/24/2016   Elevated lactic acid level    Other specified hypothyroidism    Acute on chronic diastolic congestive heart failure (HCC) 03/11/2016   Snoring 03/11/2016   Dizziness 03/26/2014   Fatigue 12/09/2012   Precordial chest pain 12/09/2012   Dyspnea 06/11/2011   Atrial fibrillation (HCC) 06/11/2011   HLD (hyperlipidemia)  12/11/2009   HYPERTENSION, BENIGN 12/11/2009   CAD, NATIVE VESSEL 12/11/2009   AV BLOCK, COMPLETE 12/11/2009   PACEMAKER-Medtronic 12/11/2009   PCP:  Jarome Matin, MD Pharmacy:   [facility] - [place], [place] - [address] [place] [address] Phone: [phone] Fax: [phone]     Social Determinants of Health (SDOH) Interventions    Readmission Risk Interventions No flowsheet data found.

## 2021-02-02 NOTE — TOC Transition Note (Signed)
Transition of Care [facility]) - CM/SW Discharge Note   Patient Details  Name: Beth Santiago MRN: [id number] Date of Birth: 07/14/1924  Transition of Care [facility]) CM/SW Contact:  Patrice Paradise, LCSW Phone Number: [phone] 02/02/2021, 11:00 AM   Clinical Narrative:                 Patient will DC to:?[facility] Anticipated DC date:?02/02/2021 Family notified:?John Transport by: Sharin Mons   Per MD patient ready for DC to [facility]. RN, patient, patient's family, and facility notified of DC. Discharge Summary sent to facility. RN given number for report   [phone]. DC packet on chart. Ambulance transport requested for patient.   CSW signing off.   Judd Lien, [place] [phone]        Patient Goals and CMS Choice        Discharge Placement                       Discharge Plan and Services                                     Social Determinants of Health (SDOH) Interventions     Readmission Risk Interventions No flowsheet data found.

## 2021-02-02 NOTE — Discharge Summary (Addendum)
Physician Discharge Summary  Beth Santiago:[id number] DOB: 09/16/1924 DOA: 01/31/2021  PCP: Jarome Matin, MD  Admit date: 01/31/2021 Discharge date: 02/02/2021 30 Day Unplanned Readmission Risk Score    Flowsheet Row ED to Hosp-Admission (Current) from 01/31/2021 in [facility] 3E HF PCU  30 Day Unplanned Readmission Risk Score (%) 14.1 Filed at 02/02/2021 0801       This score is the patient's risk of an unplanned readmission within 30 days of being discharged (0 -100%). The score is based on dignosis, age, lab data, medications, orders, and past utilization.   Low:  0-14.9   Medium: 15-21.9   High: 22-29.9   Extreme: 30 and above          Admitted From: SNF Disposition: SNF  Recommendations for Outpatient Follow-up:  Follow up with PCP in 1-2 weeks Please obtain BMP/CBC in one week Please follow up with your PCP on the following pending results: Unresulted Labs (From admission, onward)     Start     Ordered   02/01/21 1600  Hematocrit  Now then every 8 hours,   R     Comments: Call MD for hemoglobin less than 8   Question:  Specimen collection method  Answer:  Lab=Lab collect   02/01/21 0937   02/01/21 1600  Hemoglobin  Now then every 8 hours,   R     Comments: Call MD for hgb less than 8   Question:  Specimen collection method  Answer:  Lab=Lab collect   02/01/21 1423   02/01/21 0500  Basic metabolic panel  Daily,   R      01/31/21 2203              Home Health: None Equipment/Devices: None  Discharge Condition: Stable CODE STATUS: DNR Diet recommendation: Cardiac  Subjective: Seen and examined.  No complaints.  No further episodes of hematemesis since admission.  Following HPI and ED course is copied from my colleague admitting hospitalist Dr. Francesco Runner H&P. HPI: Beth Santiago is a pleasant 87 y.o. female with medical history significant for heart block with pacer, coronary artery disease, chronic diastolic CHF,  hypothyroidism, and atrial fibrillation on Xarelto, now presenting from her SNF for evaluation of coffee-ground emesis and drop in hemoglobin.  Patient was noted to have coffee-ground emesis at her nursing facility, Xarelto was placed on hold, and she had blood work performed with hemoglobin 7.5, lower than priors.  Patient was sent to the emergency department where she has no complaints, remembers vomiting earlier, denies abdominal pain, and was not sure why she was here.   ED Course: Upon arrival to the ED, patient is found to be afebrile, saturating well, and with systolic blood pressure 104.  EKG features a paced rhythm.  Hemoglobin is 8.5, down from 11 last month, and fecal occult blood testing is positive.  Patient was given 40 mg IV Protonix in the ED and gastroenterology was consulted by the ED physician.  Brief/Interim Summary: In short, patient was admitted with acute blood loss anemia secondary to upper GI bleeding.  She received 2 units of PRBC transfusion.  Hemoglobin improved over 8.5 and has remained stable.  GI was consulted and she underwent EGD which showed erosive esophagitis and gastritis.  Biopsies were taken.  Postprocedure, she was placed on diet and she is tolerating regular diet now.  GI has cleared her for discharge with recommendations to continue omeprazole 40 mg p.o. twice daily for 6 weeks followed by 40 mg  p.o. daily and Carafate 3 times daily for 4 weeks.  She has had no further episodes of hematemesis.  She is a stable for discharge and she is going to be discharged to SNF today.  I have discussed her discharge plan with her son Jenny Reichmann who is in agreement.  Per GI recommendations, I have advised the son to resume her anticoagulation in 3 days if no signs of upper GI bleeding.  It would be nice if SNF could check her hemoglobin 2-3 days from here before initiating anticoagulation.  Discharge Diagnoses:  Principal Problem:   Upper GI bleeding Active Problems:   HYPERTENSION,  BENIGN   CAD, NATIVE VESSEL   Atrial fibrillation (HCC)   Chronic diastolic CHF (congestive heart failure) (HCC)   Acute blood loss anemia   Coffee ground emesis   Hiatal hernia   Esophagitis   Gastritis    Discharge Instructions   Allergies as of 02/02/2021   No Known Allergies      Medication List     TAKE these medications    acetaminophen 500 MG tablet Commonly known as: TYLENOL Take 500 mg by mouth in the morning, at noon, and at bedtime. (0900, 1300 & 2100)   Artificial Tears 1 % ophthalmic solution Generic drug: carboxymethylcellulose Place 2 drops into both eyes in the morning and at bedtime. ([id number] & [id number])   Bystolic 5 MG tablet Generic drug: nebivolol TAKE 1/2 TABLET BY MOUTH DAILY What changed: how much to take   ferrous sulfate 325 (65 FE) MG tablet Take 325 mg by mouth daily.   furosemide 40 MG tablet Commonly known as: LASIX Take 20 mg by mouth daily.   levothyroxine 88 MCG tablet Commonly known as: SYNTHROID Take 44-88 mcg by mouth See admin instructions. 44 mcg by mouth on Tuesdays at 0630, and 88 mcg by mouth all other days of the week at 0630   multivitamin with minerals Tabs tablet Take 1 tablet by mouth daily.   pantoprazole 40 MG tablet Commonly known as: PROTONIX Take 1 tablet (40 mg total) by mouth 2 (two) times daily.   potassium chloride SA 20 MEQ tablet Commonly known as: KLOR-CON TAKE 1 TABLET BY MOUTH  DAILY AND TAKE 1 EXTRA  TABLET WHEN YOU TAKE EXTRA  LASIX What changed:  how much to take how to take this when to take this additional instructions   Rivaroxaban 15 MG Tabs tablet Commonly known as: XARELTO Take 1 tablet (15 mg total) by mouth daily with supper. Start taking on: February 05, 2021 What changed: These instructions start on February 05, 2021. If you are unsure what to do until then, ask your doctor or other care provider.   sodium chloride 0.9 % infusion Inject 60 mLs into the vein 2 (two) times daily as  needed (hydration). 87ml/hour   spironolactone 25 MG tablet Commonly known as: ALDACTONE Take 12.5 mg by mouth daily.   sucralfate 1 GM/10ML suspension Commonly known as: CARAFATE Take 1 g by mouth 4 (four) times daily -  with meals and at bedtime. For 2 weeks   timolol 0.5 % ophthalmic solution Commonly known as: TIMOPTIC Place 1 drop into both eyes daily.   Vitamin D-3 125 MCG (5000 UT) Tabs Take 5,000 Units by mouth daily.        Follow-up Information     Leanna Battles, MD Follow up in 1 week(s).   Specialty: Internal Medicine Contact information: [address] [place] [address] [phone]  Bensimhon, Shaune Pascal, MD .   Specialty: Cardiology Contact information: [address] [place] [address] [phone]         Thompson Grayer, MD .   Specialty: Cardiology Contact information: [address] [place] [address] [phone]                No Known Allergies  Consultations: GI   Procedures/Studies: EP PPM/ICD IMPLANT  Result Date: 01/08/2021 SURGEON:  Thompson Grayer, MD   PREPROCEDURE DIAGNOSES:  1. Complete heart block  2. Permanent Atrial fibrillation.  3. Elective Replacement Indicator Status   POSTPROCEDURE DIAGNOSES:  1. Complete heart block  2. Permanent Atrial fibrillation.  3. Elective Replacement Indicator Status   PROCEDURES:  1. Pacemaker pulse generator replacement.  2. Skin pocket revision.   INTRODUCTION:  Beth Santiago is a 87 y.o. female with a history of complete heart block. The patient has done well since his pacemaker was implanted.  The patient  has recently reached ERI battery status.  The patient presents today for pacemaker pulse generator replacement.     DESCRIPTION OF THE PROCEDURE:  Informed written consent was obtained, and the patient was brought to the electrophysiology lab in the fasting state.  The patient's pacemaker was interrogated today and found to be  at elective replacement indicator battery status.  The patient required no sedation for the procedure today.  The patient's left chest was prepped and draped in the usual sterile fashion by the EP lab staff.  The skin overlying the existing pacemaker was infiltrated with lidocaine for local analgesia.  A 4-cm incision was made over the pacemaker pocket.  Using a combination of sharp and blunt dissection, the pacemaker was exposed and removed from the body.  The device was disconnected from the leads.  There was no foreign matter or debris within the pocket.  The atrial lead was confirmed to be a Medtronic model [id number] (serial number [id number]) lead implanted on 12/18/2003.  The right ventricular lead was confirmed to be a Medtronic model [id number] (serial number [id number]) lead implanted on the same date as the atrial lead (above). Both leads were examined and their integrity was confirmed to be intact.  Atrial lead was not tested due to permanent atrial fibrillation.  Right ventricular lead R-waves could not be measured due to complete heart block.  The RV lead impedance was 399 ohms with a threshold of 1 V at 0.7 msec. Both leads were connected to a Medtronic Azure XT DR MRI Surescan model [id number]) pacemaker.  The pocket was revised to accommodate this new device.  Electrocautery was required to assure hemostasis.  The pocket was irrigated with copious gentamicin solution. The pacemaker was then placed into a Tyrex pouch and then placed into the pocket.  The pocket was then closed in 2 layers with 2-0 Vicryl suture over the subcutaneous and subcuticular layers.  Steri-Strips and a sterile dressing were then applied.EBL<93ml. There were no early apparent complications. Permanent Pacemaker Indication: Documented non-reversible symptomatic bradycardia due to second degree and/or third degree atrioventricular block.   CONCLUSIONS:  1. Successful pacemaker pulse generator replacement for  complete heart block and elective replacement indicator battery status  2. No early apparent complications.   Thompson Grayer, MD 01/08/2021 3:44 PM  CUP PACEART INCLINIC DEVICE CHECK  Result Date: 01/22/2021 Wound check appointment s/p gen change 01/08/21. Steri-strips removed. Wound without redness or edema. Incision edges approximated, wound well healed. Normal device function.  Thresholds, sensing, and impedances consistent with implant measurements. Device programmed at chronic lead settings. Histogram distribution appropriate for patient and level of activity. No mode switches or high ventricular rates noted. Patient educated about wound care, arm mobility, lifting restrictions. Patient enrolled in  remote monitoring with next transmission scheduled 04/10/20. Patient's daughter will be called to schedule 91 day post gen change with Dr. Rayann Heman.Beth Santiago, BSN, RN    Discharge Exam: Vitals:   02/01/21 2008 02/02/21 0351  BP: (!) 118/55 130/71  Pulse: 60 60  Resp: 19 20  Temp: 97.6 F (36.4 C) 97.6 F (36.4 C)  SpO2: 97% 97%   Vitals:   02/01/21 1530 02/01/21 1540 02/01/21 2008 02/02/21 0351  BP:  (!) 117/51 (!) 118/55 130/71  Pulse: (!) 59 (!) 59 60 60  Resp: (!) 22 19 19 20   Temp:   97.6 F (36.4 C) 97.6 F (36.4 C)  TempSrc:   Oral Oral  SpO2: 100% 99% 97% 97%  Weight:    63 kg  Height:        General: Pt is alert, awake, not in acute distress Cardiovascular: RRR, S1/S2 +, no rubs, no gallops Respiratory: CTA bilaterally, no wheezing, no rhonchi Abdominal: Soft, NT, ND, bowel sounds + Extremities: no edema, no cyanosis    The results of significant diagnostics from this hospitalization (including imaging, microbiology, ancillary and laboratory) are listed below for reference.     Microbiology: Recent Results (from the past 240 hour(s))  Resp Panel by RT-PCR (Flu A&B, Covid) Nasopharyngeal Swab     Status: None   Collection Time: 01/31/21  9:20 PM   Specimen:  Nasopharyngeal Swab; Nasopharyngeal(NP) swabs in vial transport medium  Result Value Ref Range Status   SARS Coronavirus 2 by RT PCR NEGATIVE NEGATIVE Final    Comment: (NOTE) SARS-CoV-2 target nucleic acids are NOT DETECTED.  The SARS-CoV-2 RNA is generally detectable in upper respiratory specimens during the acute phase of infection. The lowest concentration of SARS-CoV-2 viral copies this assay can detect is 138 copies/mL. A negative result does not preclude SARS-Cov-2 infection and should not be used as the sole basis for treatment or other patient management decisions. A negative result may occur with  improper specimen collection/handling, submission of specimen other than nasopharyngeal swab, presence of viral mutation(s) within the areas targeted by this assay, and inadequate number of viral copies(<138 copies/mL). A negative result must be combined with clinical observations, patient history, and epidemiological information. The expected result is Negative.  Fact Sheet for Patients:  EntrepreneurPulse.com.au  Fact Sheet for Healthcare Providers:  IncredibleEmployment.be  This test is no t yet approved or cleared by the [place] FDA and  has been authorized for detection and/or diagnosis of SARS-CoV-2 by FDA under an Emergency Use Authorization (EUA). This EUA will remain  in effect (meaning this test can be used) for the duration of the COVID-19 declaration under Section 564(b)(1) of the Act, 21 U.S.C.section 360bbb-3(b)(1), unless the authorization is terminated  or revoked sooner.       Influenza A by PCR NEGATIVE NEGATIVE Final   Influenza B by PCR NEGATIVE NEGATIVE Final    Comment: (NOTE) The Xpert Xpress SARS-CoV-2/FLU/RSV plus assay is intended as an aid in the diagnosis of influenza from Nasopharyngeal swab specimens and should not be used as a sole basis for treatment. Nasal washings and aspirates are unacceptable for  Xpert Xpress SARS-CoV-2/FLU/RSV testing.  Fact Sheet for Patients: EntrepreneurPulse.com.au  Fact Sheet for Healthcare Providers: IncredibleEmployment.be  This test is not yet approved or cleared by the [place] and has been authorized for detection and/or diagnosis of SARS-CoV-2 by FDA under an Emergency Use Authorization (EUA). This EUA will remain in effect (meaning this test can be used) for the duration of the COVID-19 declaration under Section 564(b)(1) of the Act, 21 U.S.C. section 360bbb-3(b)(1), unless the authorization is terminated or revoked.  Performed at [facility], [place] [address] [place], [place] [address]      Labs: BNP (last 3 results) No results for input(s): BNP in the last 8760 hours. Basic Metabolic Panel: Recent Labs  Lab 01/31/21 2035 01/31/21 2051 02/01/21 0156 02/02/21 0026  NA 140 142 138 135  K 4.0 3.7 4.2 4.1  CL 113* 110 112* 110  CO2 21*  --  20* 21*  GLUCOSE 116* 110* 130* 113*  BUN 18 18 17 14   CREATININE 0.61 0.60 0.67 0.59  CALCIUM 8.9  --  8.6* 8.5*   Liver Function Tests: Recent Labs  Lab 01/31/21 2035  AST 20  ALT 8  ALKPHOS 55  BILITOT 0.6  PROT 5.6*  ALBUMIN 2.9*   No results for input(s): LIPASE, AMYLASE in the last 168 hours. No results for input(s): AMMONIA in the last 168 hours. CBC: Recent Labs  Lab 01/31/21 2035 01/31/21 2051 02/01/21 0156 02/01/21 0837 02/01/21 1552 02/02/21 0026  WBC 11.9*  --   --   --   --   --   NEUTROABS 9.6*  --   --   --   --   --   HGB 8.5* 9.5* 8.2* 7.6* 8.1* 8.4*  HCT 27.4* 28.0* 26.4* 25.2* 26.2* 27.5*  MCV 91.0  --   --   --   --   --   PLT 199  --   --   --   --   --    Cardiac Enzymes: No results for input(s): CKTOTAL, CKMB, CKMBINDEX, TROPONINI in the last 168 hours. BNP: Invalid input(s): POCBNP CBG: No results for input(s): GLUCAP in the last 168 hours. D-Dimer No results for input(s): DDIMER in the last 72  hours. Hgb A1c No results for input(s): HGBA1C in the last 72 hours. Lipid Profile No results for input(s): CHOL, HDL, LDLCALC, TRIG, CHOLHDL, LDLDIRECT in the last 72 hours. Thyroid function studies No results for input(s): TSH, T4TOTAL, T3FREE, THYROIDAB in the last 72 hours.  Invalid input(s): FREET3 Anemia work up No results for input(s): VITAMINB12, FOLATE, FERRITIN, TIBC, IRON, RETICCTPCT in the last 72 hours. Urinalysis    Component Value Date/Time   COLORURINE AMBER (A) 05/24/2017 2157   APPEARANCEUR CLEAR 05/24/2017 2157   LABSPEC 1.020 05/24/2017 2157   PHURINE 5.0 05/24/2017 2157   GLUCOSEU NEGATIVE 05/24/2017 2157   HGBUR SMALL (A) 05/24/2017 2157   BILIRUBINUR NEGATIVE 05/24/2017 2157   KETONESUR NEGATIVE 05/24/2017 2157   PROTEINUR NEGATIVE 05/24/2017 2157   UROBILINOGEN 0.2 12/09/2012 1401   NITRITE NEGATIVE 05/24/2017 2157   LEUKOCYTESUR TRACE (A) 05/24/2017 2157   Sepsis Labs Invalid input(s): PROCALCITONIN,  WBC,  LACTICIDVEN Microbiology Recent Results (from the past 240 hour(s))  Resp Panel by RT-PCR (Flu A&B, Covid) Nasopharyngeal Swab     Status: None   Collection Time: 01/31/21  9:20 PM   Specimen: Nasopharyngeal Swab; Nasopharyngeal(NP) swabs in vial transport medium  Result Value Ref Range Status   SARS Coronavirus 2 by RT PCR NEGATIVE NEGATIVE Final    Comment: (NOTE) SARS-CoV-2 target nucleic acids are NOT  DETECTED.  The SARS-CoV-2 RNA is generally detectable in upper respiratory specimens during the acute phase of infection. The lowest concentration of SARS-CoV-2 viral copies this assay can detect is 138 copies/mL. A negative result does not preclude SARS-Cov-2 infection and should not be used as the sole basis for treatment or other patient management decisions. A negative result may occur with  improper specimen collection/handling, submission of specimen other than nasopharyngeal swab, presence of viral mutation(s) within the areas  targeted by this assay, and inadequate number of viral copies(<138 copies/mL). A negative result must be combined with clinical observations, patient history, and epidemiological information. The expected result is Negative.  Fact Sheet for Patients:  EntrepreneurPulse.com.au  Fact Sheet for Healthcare Providers:  IncredibleEmployment.be  This test is no t yet approved or cleared by the [place] FDA and  has been authorized for detection and/or diagnosis of SARS-CoV-2 by FDA under an Emergency Use Authorization (EUA). This EUA will remain  in effect (meaning this test can be used) for the duration of the COVID-19 declaration under Section 564(b)(1) of the Act, 21 U.S.C.section 360bbb-3(b)(1), unless the authorization is terminated  or revoked sooner.       Influenza A by PCR NEGATIVE NEGATIVE Final   Influenza B by PCR NEGATIVE NEGATIVE Final    Comment: (NOTE) The Xpert Xpress SARS-CoV-2/FLU/RSV plus assay is intended as an aid in the diagnosis of influenza from Nasopharyngeal swab specimens and should not be used as a sole basis for treatment. Nasal washings and aspirates are unacceptable for Xpert Xpress SARS-CoV-2/FLU/RSV testing.  Fact Sheet for Patients: EntrepreneurPulse.com.au  Fact Sheet for Healthcare Providers: IncredibleEmployment.be  This test is not yet approved or cleared by the [place] FDA and has been authorized for detection and/or diagnosis of SARS-CoV-2 by FDA under an Emergency Use Authorization (EUA). This EUA will remain in effect (meaning this test can be used) for the duration of the COVID-19 declaration under Section 564(b)(1) of the Act, 21 U.S.C. section 360bbb-3(b)(1), unless the authorization is terminated or revoked.  Performed at [facility], [place] [address], [place], [place] [address]      Time coordinating discharge: Over 30  minutes  SIGNED:   Darliss Cheney, MD  [facility] 02/02/2021, 10:20 AM  If 7PM-7AM, please contact night-coverage [email or web address]

## 2021-02-03 ENCOUNTER — Encounter (HOSPITAL_COMMUNITY): Payer: Self-pay | Admitting: Gastroenterology

## 2021-02-04 LAB — SURGICAL PATHOLOGY

## 2021-02-19 ENCOUNTER — Encounter: Payer: Self-pay | Admitting: Gastroenterology

## 2021-02-19 ENCOUNTER — Ambulatory Visit: Payer: Medicare Other | Admitting: Gastroenterology

## 2021-04-09 LAB — CUP PACEART REMOTE DEVICE CHECK
Battery Remaining Longevity: 153 mo
Battery Voltage: 3.21 V
Brady Statistic AP VP Percent: 0 %
Brady Statistic AP VS Percent: 0 %
Brady Statistic AS VP Percent: 99.98 %
Brady Statistic AS VS Percent: 0.02 %
Brady Statistic RA Percent Paced: 0 %
Brady Statistic RV Percent Paced: 99.98 %
Date Time Interrogation Session: 20230118054610
Implantable Lead Implant Date: 20050927
Implantable Lead Implant Date: 20050927
Implantable Lead Location: 753859
Implantable Lead Location: 753860
Implantable Lead Model: 5076
Implantable Lead Model: 5076
Implantable Pulse Generator Implant Date: 20221019
Lead Channel Impedance Value: 323 Ohm
Lead Channel Impedance Value: 342 Ohm
Lead Channel Impedance Value: 399 Ohm
Lead Channel Impedance Value: 418 Ohm
Lead Channel Pacing Threshold Amplitude: 0.875 V
Lead Channel Pacing Threshold Pulse Width: 0.4 ms
Lead Channel Setting Pacing Amplitude: 2 V
Lead Channel Setting Pacing Pulse Width: 0.4 ms
Lead Channel Setting Sensing Sensitivity: 8 mV

## 2021-04-10 ENCOUNTER — Ambulatory Visit (INDEPENDENT_AMBULATORY_CARE_PROVIDER_SITE_OTHER): Payer: Medicare Other

## 2021-04-10 DIAGNOSIS — I442 Atrioventricular block, complete: Secondary | ICD-10-CM

## 2021-04-23 NOTE — Progress Notes (Signed)
Remote pacemaker transmission.   

## 2021-05-09 ENCOUNTER — Encounter: Payer: Self-pay | Admitting: Internal Medicine

## 2021-05-09 ENCOUNTER — Other Ambulatory Visit: Payer: Self-pay

## 2021-05-09 ENCOUNTER — Ambulatory Visit (INDEPENDENT_AMBULATORY_CARE_PROVIDER_SITE_OTHER): Payer: Medicare Other | Admitting: Internal Medicine

## 2021-05-09 VITALS — BP 106/68 | HR 60 | Ht 68.0 in

## 2021-05-09 DIAGNOSIS — I5032 Chronic diastolic (congestive) heart failure: Secondary | ICD-10-CM

## 2021-05-09 DIAGNOSIS — I10 Essential (primary) hypertension: Secondary | ICD-10-CM | POA: Diagnosis not present

## 2021-05-09 DIAGNOSIS — I442 Atrioventricular block, complete: Secondary | ICD-10-CM | POA: Diagnosis not present

## 2021-05-09 DIAGNOSIS — Z950 Presence of cardiac pacemaker: Secondary | ICD-10-CM | POA: Diagnosis not present

## 2021-05-09 DIAGNOSIS — I4821 Permanent atrial fibrillation: Secondary | ICD-10-CM

## 2021-05-09 NOTE — Progress Notes (Signed)
PCP: Donnajean Lopes, MD Primary Cardiologist: Dr Haroldine Laws Primary EP:  Dr Rayann Heman  Beth Santiago is a 88 y.o. female who presents today for routine electrophysiology followup.  Since her recent generator change, the patient reports doing very well.  Today, she denies symptoms of palpitations, chest pain, shortness of breath,  lower extremity edema, dizziness, presyncope, or syncope.  The patient is otherwise without complaint today.   Past Medical History:  Diagnosis Date   Atrial flutter ([place])    a. Noted during pacemaker implantation 2011, spontaneously terminated.   Biatrial enlargement    severe   CAD (coronary artery disease)    a. Nonobstructive by cath 2005 - 40-50% mid LAD. b. Nonischemic nuc 05/2011.   Carotid artery disease ([place])    a. 1-50% bilateral (upper end) - March 2011.   CHF (congestive heart failure) ([place])    Complete heart block ([place])    a. s/p PPM 2005. b. Gen change to [employer] by Dr Rayann Heman  9/11 for premature ERI 11/2009.   DJD (degenerative joint disease)    GERD (gastroesophageal reflux disease)    Hemoptysis 05/2016   Hyperlipidemia    Hypertension    Hypothyroid    Permanent atrial fibrillation ([place]) 05/2011   Severe tricuspid regurgitation    Past Surgical History:  Procedure Laterality Date   ABDOMINAL SURGERY     twisted bowel   BIOPSY  02/01/2021   Procedure: BIOPSY;  Surgeon: Lavena Bullion, DO;  Location: [facility];  Service: Gastroenterology;;   CARDIOVERSION  06/11/2011   Procedure: CARDIOVERSION;  Surgeon: Jolaine Artist, MD;  Location: [facility];  Service: Cardiovascular;  Laterality: N/A;   CARDIOVERSION N/A 01/04/2013   Procedure: CARDIOVERSION;  Surgeon: Dorothy Spark, MD;  Location: [facility];  Service: Cardiovascular;  Laterality: N/A;   COLON SURGERY     ESOPHAGOGASTRODUODENOSCOPY (EGD) WITH PROPOFOL N/A 02/01/2021   Procedure: ESOPHAGOGASTRODUODENOSCOPY (EGD) WITH PROPOFOL;  Surgeon: Lavena Bullion, DO;  Location: [facility];  Service: Gastroenterology;  Laterality: N/A;   HEMORROIDECTOMY     PACEMAKER INSERTION  2005, 12/11/09   implanted by Dr Verlon Setting, generator change 9/11 by Greggory Brandy for premature ERI   PPM GENERATOR CHANGEOUT N/A 01/08/2021   Procedure: PPM GENERATOR CHANGEOUT;  Surgeon: Thompson Grayer, MD;  Location: [facility];  Service: Cardiovascular;  Laterality: N/A;   TEE WITHOUT CARDIOVERSION  06/11/2011   Procedure: TRANSESOPHAGEAL ECHOCARDIOGRAM (TEE);  Surgeon: Jolaine Artist, MD;  Location: [facility];  Service: Cardiovascular;  Laterality: N/A;   TEE WITHOUT CARDIOVERSION N/A 05/27/2017   Procedure: TRANSESOPHAGEAL ECHOCARDIOGRAM (TEE);  Surgeon: Jerline Pain, MD;  Location: [facility];  Service: Cardiovascular;  Laterality: N/A;   TONSILLECTOMY      ROS- all systems are reviewed and negative except as per HPI above  Current Outpatient Medications  Medication Sig Dispense Refill   acetaminophen (TYLENOL) 500 MG tablet Take 500 mg by mouth in the morning, at noon, and at bedtime. (0900, 1300 & [id number])     BYSTOLIC 5 MG tablet TAKE 1/2 TABLET BY MOUTH DAILY (Patient taking differently: Take 2.5 mg by mouth daily.) 45 tablet 3   carboxymethylcellulose (ARTIFICIAL TEARS) 1 % ophthalmic solution Place 2 drops into both eyes in the morning and at bedtime. (0900 & 2100)     Cholecalciferol (VITAMIN D-3) 125 MCG (5000 UT) TABS Take 5,000 Units by mouth daily.     ferrous sulfate 325 (65 FE) MG tablet Take 325 mg by  mouth daily.     furosemide (LASIX) 40 MG tablet Take 20 mg by mouth daily.     levothyroxine (SYNTHROID, LEVOTHROID) 88 MCG tablet Take 44-88 mcg by mouth See admin instructions. 44 mcg by mouth on Tuesdays at 0630, and 88 mcg by mouth all other days of the week at 0630     Multiple Vitamin (MULTIVITAMIN WITH MINERALS) TABS tablet Take 1 tablet by mouth daily.     pantoprazole (PROTONIX) 40 MG tablet Take 40 mg by mouth daily.     potassium chloride  SA (KLOR-CON M) 20 MEQ tablet Take 20 mEq by mouth daily.     sodium chloride 0.9 % infusion Inject 60 mLs into the vein 2 (two) times daily as needed (hydration). 61ml/hour     spironolactone (ALDACTONE) 25 MG tablet Take 12.5 mg by mouth daily.     timolol (TIMOPTIC) 0.5 % ophthalmic solution Place 1 drop into both eyes daily.     Rivaroxaban (XARELTO) 15 MG TABS tablet Take 1 tablet (15 mg total) by mouth daily with supper. 30 tablet 0   sucralfate (CARAFATE) 1 GM/10ML suspension Take 1 g by mouth 4 (four) times daily -  with meals and at bedtime. For 2 weeks     No current facility-administered medications for this visit.    Physical Exam: Vitals:   05/09/21 1415  BP: 106/68  Pulse: 60  SpO2: 95%  Height: 5\' 8"  (1.727 m)    GEN- The patient is well appearing, alert and oriented x 3 today.   Head- normocephalic, atraumatic Eyes-  Sclera clear, conjunctiva pink Ears- hearing intact Oropharynx- clear Lungs- Clear to ausculation bilaterally, normal work of breathing Chest- pacemaker pocket is well healed Heart- Regular rate and rhythm(paced) GI- soft, NT, ND, + BS Extremities- no clubbing, cyanosis, or edema  Pacemaker interrogation- reviewed in detail today,  See PACEART report  ekg tracing ordered today is personally reviewed and shows afib, V pacing  Assessment and Plan:  1. Symptomatic complete heart block Normal pacemaker function See Pace Art report No changes today she is device dependant today  2. Permanent afib Rate controlled On xarelto  3. HTN Stable No change required today  4. Chronic diastolic dysfunction/ severe TR Stable No change required today  Return to see EP APP in a year  Thompson Grayer MD, [facility] 05/09/2021 2:27 PM

## 2021-05-09 NOTE — Patient Instructions (Addendum)
Medication Instructions:  Your physician recommends that you continue on your current medications as directed. Please refer to the Current Medication list given to you today.  Labwork: None ordered.  Testing/Procedures: None ordered.  Follow-Up: Your physician wants you to follow-up in: one year with Francis Dowse, PA-C You will receive a reminder letter in the mail two months in advance. If you don't receive a letter, please call our office to schedule the follow-up appointment.  Remote monitoring is used to monitor your Pacemaker from home. This monitoring reduces the number of office visits required to check your device to one time per year. It allows [place] to keep an eye on the functioning of your device to ensure it is working properly. You are scheduled for a device check from home on 07/10/2021. You may send your transmission at any time that day. If you have a wireless device, the transmission will be sent automatically. After your physician reviews your transmission, you will receive a postcard with your next transmission date.  Any Other Special Instructions Will Be Listed Below (If Applicable).  If you need a refill on your cardiac medications before your next appointment, please call your pharmacy.

## 2021-07-10 ENCOUNTER — Ambulatory Visit (INDEPENDENT_AMBULATORY_CARE_PROVIDER_SITE_OTHER): Payer: Medicare Other

## 2021-07-10 DIAGNOSIS — I442 Atrioventricular block, complete: Secondary | ICD-10-CM | POA: Diagnosis not present

## 2021-07-10 LAB — CUP PACEART REMOTE DEVICE CHECK
Battery Remaining Longevity: 149 mo
Battery Voltage: 3.18 V
Brady Statistic AP VP Percent: 0 %
Brady Statistic AP VS Percent: 0 %
Brady Statistic AS VP Percent: 99.92 %
Brady Statistic AS VS Percent: 0.08 %
Brady Statistic RA Percent Paced: 0 %
Brady Statistic RV Percent Paced: 99.92 %
Date Time Interrogation Session: 20230419035013
Implantable Lead Implant Date: 20050927
Implantable Lead Implant Date: 20050927
Implantable Lead Location: 753859
Implantable Lead Location: 753860
Implantable Lead Model: 5076
Implantable Lead Model: 5076
Implantable Pulse Generator Implant Date: 20221019
Lead Channel Impedance Value: 323 Ohm
Lead Channel Impedance Value: 361 Ohm
Lead Channel Impedance Value: 399 Ohm
Lead Channel Impedance Value: 475 Ohm
Lead Channel Pacing Threshold Amplitude: 0.875 V
Lead Channel Pacing Threshold Pulse Width: 0.4 ms
Lead Channel Setting Pacing Amplitude: 2 V
Lead Channel Setting Pacing Pulse Width: 0.4 ms
Lead Channel Setting Sensing Sensitivity: 8 mV

## 2021-07-28 NOTE — Progress Notes (Signed)
Remote pacemaker transmission.   

## 2021-10-09 ENCOUNTER — Ambulatory Visit (INDEPENDENT_AMBULATORY_CARE_PROVIDER_SITE_OTHER): Payer: Medicare Other

## 2021-10-09 DIAGNOSIS — I442 Atrioventricular block, complete: Secondary | ICD-10-CM

## 2021-10-09 LAB — CUP PACEART REMOTE DEVICE CHECK
Battery Remaining Longevity: 147 mo
Battery Voltage: 3.14 V
Brady Statistic AP VP Percent: 0 %
Brady Statistic AP VS Percent: 0 %
Brady Statistic AS VP Percent: 99.93 %
Brady Statistic AS VS Percent: 0.07 %
Brady Statistic RA Percent Paced: 0 %
Brady Statistic RV Percent Paced: 99.93 %
Date Time Interrogation Session: 20230719005601
Implantable Lead Implant Date: 20050927
Implantable Lead Implant Date: 20050927
Implantable Lead Location: 753859
Implantable Lead Location: 753860
Implantable Lead Model: 5076
Implantable Lead Model: 5076
Implantable Pulse Generator Implant Date: 20221019
Lead Channel Impedance Value: 342 Ohm
Lead Channel Impedance Value: 361 Ohm
Lead Channel Impedance Value: 418 Ohm
Lead Channel Impedance Value: 475 Ohm
Lead Channel Pacing Threshold Amplitude: 0.875 V
Lead Channel Pacing Threshold Pulse Width: 0.4 ms
Lead Channel Setting Pacing Amplitude: 2 V
Lead Channel Setting Pacing Pulse Width: 0.4 ms
Lead Channel Setting Sensing Sensitivity: 8 mV

## 2021-10-31 NOTE — Progress Notes (Signed)
Remote pacemaker transmission.   

## 2021-12-17 ENCOUNTER — Telehealth: Payer: Self-pay | Admitting: Internal Medicine

## 2021-12-17 NOTE — Telephone Encounter (Signed)
Remote transmission received.  All numbers stable.  Left message for nurse advising pacemaker working normally.  Await further needs.

## 2021-12-17 NOTE — Telephone Encounter (Signed)
Caller stated patient has been very sleepy and would like her pacer readings.

## 2021-12-17 NOTE — Telephone Encounter (Signed)
Call returned to nurse at [facility].    Per nurse, Pt had 2 birthday parties this past weekend and has been very sleepy since.  Advised he could send a remote transmission to review current pacemaker function.  He will send remote transmission.  Will follow up when results received.

## 2022-01-07 LAB — CUP PACEART REMOTE DEVICE CHECK
Battery Remaining Longevity: 144 mo
Battery Voltage: 3.09 V
Brady Statistic AP VP Percent: 0 %
Brady Statistic AP VS Percent: 0 %
Brady Statistic AS VP Percent: 99.9 %
Brady Statistic AS VS Percent: 0.1 %
Brady Statistic RA Percent Paced: 0 %
Brady Statistic RV Percent Paced: 99.9 %
Date Time Interrogation Session: 20231018030058
Implantable Lead Implant Date: 20050927
Implantable Lead Implant Date: 20050927
Implantable Lead Location: 753859
Implantable Lead Location: 753860
Implantable Lead Model: 5076
Implantable Lead Model: 5076
Implantable Pulse Generator Implant Date: 20221019
Lead Channel Impedance Value: 323 Ohm
Lead Channel Impedance Value: 361 Ohm
Lead Channel Impedance Value: 418 Ohm
Lead Channel Impedance Value: 475 Ohm
Lead Channel Pacing Threshold Amplitude: 1 V
Lead Channel Pacing Threshold Pulse Width: 0.4 ms
Lead Channel Setting Pacing Amplitude: 2 V
Lead Channel Setting Pacing Pulse Width: 0.4 ms
Lead Channel Setting Sensing Sensitivity: 8 mV

## 2022-01-08 ENCOUNTER — Ambulatory Visit (INDEPENDENT_AMBULATORY_CARE_PROVIDER_SITE_OTHER): Payer: Medicare Other

## 2022-01-08 DIAGNOSIS — I442 Atrioventricular block, complete: Secondary | ICD-10-CM

## 2022-01-19 NOTE — Progress Notes (Signed)
Remote pacemaker transmission.   

## 2022-04-09 ENCOUNTER — Ambulatory Visit: Payer: Medicare Other | Attending: Cardiology

## 2022-04-09 DIAGNOSIS — I442 Atrioventricular block, complete: Secondary | ICD-10-CM | POA: Diagnosis not present

## 2022-04-09 LAB — CUP PACEART REMOTE DEVICE CHECK
Battery Remaining Longevity: 141 mo
Battery Voltage: 3.06 V
Brady Statistic AP VP Percent: 0 %
Brady Statistic AP VS Percent: 0 %
Brady Statistic AS VP Percent: 99.94 %
Brady Statistic AS VS Percent: 0.06 %
Brady Statistic RA Percent Paced: 0 %
Brady Statistic RV Percent Paced: 99.94 %
Date Time Interrogation Session: 20240117010549
Implantable Lead Connection Status: 753985
Implantable Lead Connection Status: 753985
Implantable Lead Implant Date: 20050927
Implantable Lead Implant Date: 20050927
Implantable Lead Location: 753859
Implantable Lead Location: 753860
Implantable Lead Model: 5076
Implantable Lead Model: 5076
Implantable Pulse Generator Implant Date: 20221019
Lead Channel Impedance Value: 323 Ohm
Lead Channel Impedance Value: 361 Ohm
Lead Channel Impedance Value: 418 Ohm
Lead Channel Impedance Value: 475 Ohm
Lead Channel Pacing Threshold Amplitude: 1 V
Lead Channel Pacing Threshold Pulse Width: 0.4 ms
Lead Channel Setting Pacing Amplitude: 2 V
Lead Channel Setting Pacing Pulse Width: 0.4 ms
Lead Channel Setting Sensing Sensitivity: 8 mV
Zone Setting Status: 755011

## 2022-04-30 NOTE — Progress Notes (Signed)
Remote pacemaker transmission.   

## 2022-07-08 LAB — CUP PACEART REMOTE DEVICE CHECK
Battery Remaining Longevity: 137 mo
Battery Voltage: 3.04 V
Brady Statistic AP VP Percent: 0 %
Brady Statistic AP VS Percent: 0 %
Brady Statistic AS VP Percent: 99.85 %
Brady Statistic AS VS Percent: 0.15 %
Brady Statistic RA Percent Paced: 0 %
Brady Statistic RV Percent Paced: 99.85 %
Date Time Interrogation Session: 20240417210241
Implantable Lead Connection Status: 753985
Implantable Lead Connection Status: 753985
Implantable Lead Implant Date: 20050927
Implantable Lead Implant Date: 20050927
Implantable Lead Location: 753859
Implantable Lead Location: 753860
Implantable Lead Model: 5076
Implantable Lead Model: 5076
Implantable Pulse Generator Implant Date: 20221019
Lead Channel Impedance Value: 304 Ohm
Lead Channel Impedance Value: 361 Ohm
Lead Channel Impedance Value: 399 Ohm
Lead Channel Impedance Value: 475 Ohm
Lead Channel Pacing Threshold Amplitude: 0.875 V
Lead Channel Pacing Threshold Pulse Width: 0.4 ms
Lead Channel Setting Pacing Amplitude: 2 V
Lead Channel Setting Pacing Pulse Width: 0.4 ms
Lead Channel Setting Sensing Sensitivity: 8 mV
Zone Setting Status: 755011

## 2022-07-09 ENCOUNTER — Ambulatory Visit (INDEPENDENT_AMBULATORY_CARE_PROVIDER_SITE_OTHER): Payer: Medicare Other

## 2022-07-09 DIAGNOSIS — I442 Atrioventricular block, complete: Secondary | ICD-10-CM

## 2022-07-22 DEATH — deceased

## 2022-08-10 NOTE — Progress Notes (Signed)
Remote pacemaker transmission.   

## 2022-10-08 ENCOUNTER — Ambulatory Visit: Payer: Medicare Other

## 2023-01-07 ENCOUNTER — Ambulatory Visit: Payer: Medicare Other

## 2023-04-08 ENCOUNTER — Ambulatory Visit: Payer: Medicare Other

## 2023-07-08 ENCOUNTER — Ambulatory Visit: Payer: Medicare Other
